# Patient Record
Sex: Female | Born: 1935 | Race: White | Hispanic: No | Smoking: Never smoker
Health system: Southern US, Community
[De-identification: ages and names within clinical notes are randomized; demographics above are authoritative.]

## PROBLEM LIST (undated history)

## (undated) DIAGNOSIS — I10 Essential (primary) hypertension: Secondary | ICD-10-CM

## (undated) DIAGNOSIS — I7121 Aneurysm of the ascending aorta, without rupture: Secondary | ICD-10-CM

## (undated) DIAGNOSIS — G500 Trigeminal neuralgia: Secondary | ICD-10-CM

## (undated) DIAGNOSIS — D649 Anemia, unspecified: Secondary | ICD-10-CM

## (undated) DIAGNOSIS — Z9289 Personal history of other medical treatment: Secondary | ICD-10-CM

## (undated) DIAGNOSIS — D751 Secondary polycythemia: Secondary | ICD-10-CM

## (undated) DIAGNOSIS — Z87442 Personal history of urinary calculi: Secondary | ICD-10-CM

## (undated) DIAGNOSIS — I5032 Chronic diastolic (congestive) heart failure: Secondary | ICD-10-CM

## (undated) DIAGNOSIS — I480 Paroxysmal atrial fibrillation: Secondary | ICD-10-CM

## (undated) DIAGNOSIS — D696 Thrombocytopenia, unspecified: Secondary | ICD-10-CM

## (undated) DIAGNOSIS — I712 Thoracic aortic aneurysm, without rupture: Secondary | ICD-10-CM

## (undated) DIAGNOSIS — I499 Cardiac arrhythmia, unspecified: Secondary | ICD-10-CM

## (undated) DIAGNOSIS — I2510 Atherosclerotic heart disease of native coronary artery without angina pectoris: Secondary | ICD-10-CM

## (undated) DIAGNOSIS — A419 Sepsis, unspecified organism: Secondary | ICD-10-CM

## (undated) HISTORY — DX: Trigeminal neuralgia: G50.0

## (undated) HISTORY — DX: Thoracic aortic aneurysm, without rupture: I71.2

## (undated) HISTORY — DX: Thrombocytopenia, unspecified: D69.6

## (undated) HISTORY — PX: EYE SURGERY: SHX253

## (undated) HISTORY — PX: OTHER SURGICAL HISTORY: SHX169

## (undated) HISTORY — DX: Hypomagnesemia: E83.42

## (undated) HISTORY — DX: Paroxysmal atrial fibrillation: I48.0

## (undated) HISTORY — PX: BREAST CYST EXCISION: SHX579

## (undated) HISTORY — DX: Atherosclerotic heart disease of native coronary artery without angina pectoris: I25.10

## (undated) HISTORY — DX: Sepsis, unspecified organism: A41.9

## (undated) HISTORY — DX: Chronic diastolic (congestive) heart failure: I50.32

## (undated) HISTORY — DX: Essential (primary) hypertension: I10

## (undated) HISTORY — DX: Aneurysm of the ascending aorta, without rupture: I71.21

## (undated) HISTORY — DX: Secondary polycythemia: D75.1

---

## 2001-06-18 ENCOUNTER — Ambulatory Visit (HOSPITAL_COMMUNITY): Admission: RE | Admit: 2001-06-18 | Discharge: 2001-06-18 | Payer: Self-pay | Admitting: Family Medicine

## 2001-06-18 ENCOUNTER — Encounter: Payer: Self-pay | Admitting: Family Medicine

## 2002-06-12 ENCOUNTER — Ambulatory Visit (HOSPITAL_COMMUNITY): Admission: RE | Admit: 2002-06-12 | Discharge: 2002-06-12 | Payer: Self-pay | Admitting: Neurology

## 2005-05-07 ENCOUNTER — Ambulatory Visit (HOSPITAL_COMMUNITY): Admission: RE | Admit: 2005-05-07 | Discharge: 2005-05-07 | Payer: Self-pay | Admitting: Internal Medicine

## 2005-05-07 ENCOUNTER — Ambulatory Visit: Payer: Self-pay | Admitting: Internal Medicine

## 2010-10-17 ENCOUNTER — Encounter (HOSPITAL_COMMUNITY)
Admission: RE | Admit: 2010-10-17 | Discharge: 2010-10-17 | Disposition: A | Discharge status: Home / Self Care | Payer: Medicare Other | Source: Ambulatory Visit | Attending: Ophthalmology | Admitting: Ophthalmology

## 2010-10-17 LAB — BASIC METABOLIC PANEL
BUN: 19 mg/dL (ref 6–23)
CO2: 33 mEq/L — ABNORMAL HIGH (ref 19–32)
Calcium: 10.6 mg/dL — ABNORMAL HIGH (ref 8.4–10.5)
Chloride: 101 mEq/L (ref 96–112)
Creatinine, Ser: 0.79 mg/dL (ref 0.50–1.10)
GFR calc Af Amer: >60 mL/min (ref >60)
GFR calc non Af Amer: >60 mL/min (ref >60)
Glucose, Bld: 84 mg/dL (ref 70–99)
Potassium: 4.3 mEq/L (ref 3.5–5.1)
Sodium: 141 mEq/L (ref 135–145)

## 2010-10-17 LAB — HEMOGLOBIN AND HEMATOCRIT, BLOOD
HCT: 43.4 % (ref 36.0–46.0)
Hemoglobin: 14.3 g/dL (ref 12.0–15.0)

## 2010-10-23 ENCOUNTER — Ambulatory Visit (HOSPITAL_COMMUNITY)
Admission: RE | Admit: 2010-10-23 | Discharge: 2010-10-23 | Disposition: A | Discharge status: Home / Self Care | Payer: Medicare Other | Source: Ambulatory Visit | Attending: Ophthalmology | Admitting: Ophthalmology

## 2010-10-23 DIAGNOSIS — I10 Essential (primary) hypertension: Secondary | ICD-10-CM | POA: Insufficient documentation

## 2010-10-23 DIAGNOSIS — Z79899 Other long term (current) drug therapy: Secondary | ICD-10-CM | POA: Insufficient documentation

## 2010-10-23 DIAGNOSIS — Z01810 Encounter for preprocedural cardiovascular examination: Secondary | ICD-10-CM | POA: Insufficient documentation

## 2010-10-23 DIAGNOSIS — Z01812 Encounter for preprocedural laboratory examination: Secondary | ICD-10-CM | POA: Insufficient documentation

## 2010-10-23 DIAGNOSIS — H2510 Age-related nuclear cataract, unspecified eye: Secondary | ICD-10-CM | POA: Insufficient documentation

## 2012-04-23 HISTORY — PX: ESOPHAGOGASTRODUODENOSCOPY: SHX1529

## 2013-02-20 ENCOUNTER — Telehealth (INDEPENDENT_AMBULATORY_CARE_PROVIDER_SITE_OTHER): Payer: Self-pay | Admitting: *Deleted

## 2013-02-20 ENCOUNTER — Ambulatory Visit (INDEPENDENT_AMBULATORY_CARE_PROVIDER_SITE_OTHER): Payer: Medicare Other | Admitting: Internal Medicine

## 2013-02-20 ENCOUNTER — Other Ambulatory Visit (INDEPENDENT_AMBULATORY_CARE_PROVIDER_SITE_OTHER): Payer: Self-pay | Admitting: *Deleted

## 2013-02-20 ENCOUNTER — Encounter (INDEPENDENT_AMBULATORY_CARE_PROVIDER_SITE_OTHER): Payer: Self-pay | Admitting: Internal Medicine

## 2013-02-20 VITALS — BP 138/74 | HR 80 | Temp 98.5°F | Ht 60.0 in | Wt 146.1 lb

## 2013-02-20 DIAGNOSIS — E7800 Pure hypercholesterolemia, unspecified: Secondary | ICD-10-CM | POA: Insufficient documentation

## 2013-02-20 DIAGNOSIS — D649 Anemia, unspecified: Secondary | ICD-10-CM | POA: Insufficient documentation

## 2013-02-20 DIAGNOSIS — Z1211 Encounter for screening for malignant neoplasm of colon: Secondary | ICD-10-CM

## 2013-02-20 DIAGNOSIS — R195 Other fecal abnormalities: Secondary | ICD-10-CM

## 2013-02-20 DIAGNOSIS — I10 Essential (primary) hypertension: Secondary | ICD-10-CM | POA: Insufficient documentation

## 2013-02-20 DIAGNOSIS — E785 Hyperlipidemia, unspecified: Secondary | ICD-10-CM | POA: Insufficient documentation

## 2013-02-20 NOTE — Progress Notes (Signed)
Subjective:     Patient ID: [name], female   DOB: 1928/07/01, 79 y.o.   MRN: [id number]  HPI 79 yr old white female referred to our office by Dr. [name] for anemia. On recent office visit to Dr. Margo Aye, patient c/o SOB for the pat month. Noticed when walking up steps. She was started on Iron and she does feel better. She denies prior hx of anemia. She underwent a colonoscopy in 2007 and was normal. (screening). No hx of polyps.  Appetite is good. No weight loss. She has frequent acid reflux and she will take a prn acid reliever. There is no abdominal pain. She has a BM daily. Stools are dark from the iron. Before the iron, her stools were brown. She has not seen any blood in her stools.   01/27/2013 H and H 8.9 and 29.1, MCV 69.3, Platelet ct 294.  NA 140, K 4.1, Chloride 102, Glucose 108, BUN 16, Creatinine 0.8, Total bili 0.4, ALP 52, AST 18, ALT 19, Total protein 6.8, Albumin 4.3, Calcium 9.5   Review of Systems See hpi Current Outpatient Prescriptions  Medication Sig Dispense Refill  . alendronate (FOSAMAX) 70 MG tablet Take 70 mg by mouth every 7 (seven) days. Take with a full glass of water on an empty stomach.      Marland Kitchen aspirin 81 MG tablet Take 81 mg by mouth daily.      . Calcium Carbonate-Vitamin D (CALTRATE 600+D PO) Take by mouth.      . cholecalciferol (VITAMIN D) 1000 UNITS tablet Take 400 Units by mouth daily.      Marland Kitchen co-enzyme Q-10 30 MG capsule Take 30 mg by mouth 3 (three) times daily.      . ferrous sulfate 325 (65 FE) MG tablet Take 325 mg by mouth daily with breakfast.      . fish oil-omega-3 fatty acids 1000 MG capsule Take 2 g by mouth daily.      Marland Kitchen gabapentin (NEURONTIN) 300 MG capsule Take 300 mg by mouth daily.      . Plant Sterol Stanol-Pantethine 450-75 MG TABS Take by mouth.      . pravastatin (PRAVACHOL) 40 MG tablet Take 40 mg by mouth daily.      Marland Kitchen triamterene-hydrochlorothiazide (MAXZIDE-25) 37.5-25 MG per tablet Take 1 tablet by mouth daily.        No current facility-administered medications for this visit.   Past Medical History  Diagnosis Date  . Hypertension   . Trigeminal neuralgia    History reviewed. No pertinent past surgical history. Allergies  Allergen Reactions  . Sulfa Antibiotics     Feel crazy       Objective:   Physical Exam  Filed Vitals:   02/20/13 0932  BP: 138/74  Pulse: 80  Temp: 98.5 F (36.9 C)  Height: 5' (1.524 m)  Weight: 146 lb 1.6 oz (66.271 kg)   Alert and oriented. Skin warm and dry. Oral mucosa is moist.   . Sclera anicteric, conjunctivae is pink. Thyroid not enlarged. No cervical lymphadenopathy. Lungs clear. Heart regular rate and rhythm.  Abdomen is soft. Bowel sounds are positive. No hepatomegaly. No abdominal masses felt. No tenderness.  No edema to lower extremities.  Stool dark and guaiac positive      Assessment:    Anemia. PUD, colonic neoplasm needs to be ruled out.    Plan:    EGD/Colonoscopy

## 2013-02-20 NOTE — Telephone Encounter (Signed)
Patient needs movi prep 

## 2013-02-20 NOTE — Patient Instructions (Signed)
EGD/Colonoscopy

## 2013-02-25 MED ORDER — PEG-KCL-NACL-NASULF-NA ASC-C 100 G PO SOLR: 1.0000 | Freq: Once | ORAL | Status: DC

## 2013-03-10 ENCOUNTER — Encounter (HOSPITAL_COMMUNITY): Payer: Self-pay | Admitting: *Deleted

## 2013-03-10 ENCOUNTER — Encounter (HOSPITAL_COMMUNITY)
Admission: RE | Disposition: A | Discharge status: Home / Self Care | Payer: Self-pay | Source: Ambulatory Visit | Attending: Internal Medicine

## 2013-03-10 ENCOUNTER — Ambulatory Visit (HOSPITAL_COMMUNITY)
Admission: RE | Admit: 2013-03-10 | Discharge: 2013-03-10 | Disposition: A | Discharge status: Home / Self Care | Payer: Medicare Other | Source: Ambulatory Visit | Attending: Internal Medicine | Admitting: Internal Medicine

## 2013-03-10 DIAGNOSIS — K259 Gastric ulcer, unspecified as acute or chronic, without hemorrhage or perforation: Secondary | ICD-10-CM

## 2013-03-10 DIAGNOSIS — K449 Diaphragmatic hernia without obstruction or gangrene: Secondary | ICD-10-CM

## 2013-03-10 DIAGNOSIS — R195 Other fecal abnormalities: Secondary | ICD-10-CM

## 2013-03-10 DIAGNOSIS — K2960 Other gastritis without bleeding: Secondary | ICD-10-CM | POA: Insufficient documentation

## 2013-03-10 DIAGNOSIS — K5730 Diverticulosis of large intestine without perforation or abscess without bleeding: Secondary | ICD-10-CM | POA: Insufficient documentation

## 2013-03-10 DIAGNOSIS — D509 Iron deficiency anemia, unspecified: Secondary | ICD-10-CM | POA: Insufficient documentation

## 2013-03-10 DIAGNOSIS — K2100 Gastro-esophageal reflux disease with esophagitis, without bleeding: Secondary | ICD-10-CM | POA: Insufficient documentation

## 2013-03-10 DIAGNOSIS — K219 Gastro-esophageal reflux disease without esophagitis: Secondary | ICD-10-CM

## 2013-03-10 DIAGNOSIS — D649 Anemia, unspecified: Secondary | ICD-10-CM

## 2013-03-10 DIAGNOSIS — K222 Esophageal obstruction: Secondary | ICD-10-CM

## 2013-03-10 DIAGNOSIS — I10 Essential (primary) hypertension: Secondary | ICD-10-CM | POA: Insufficient documentation

## 2013-03-10 HISTORY — PX: COLONOSCOPY WITH ESOPHAGOGASTRODUODENOSCOPY (EGD): SHX5779

## 2013-03-10 LAB — HEMOGLOBIN AND HEMATOCRIT, BLOOD
HCT: 27.7 % — ABNORMAL LOW (ref 36.0–46.0)
Hemoglobin: 8.2 g/dL — ABNORMAL LOW (ref 12.0–15.0)

## 2013-03-10 SURGERY — COLONOSCOPY WITH ESOPHAGOGASTRODUODENOSCOPY (EGD)
Anesthesia: Moderate Sedation

## 2013-03-10 MED ORDER — PANTOPRAZOLE SODIUM 40 MG PO TBEC: 40.0000 mg | DELAYED_RELEASE_TABLET | Freq: Every day | ORAL | Status: DC

## 2013-03-10 MED ORDER — MEPERIDINE HCL 50 MG/ML IJ SOLN
INTRAMUSCULAR | Status: AC
  Filled 2013-03-10: qty 1

## 2013-03-10 MED ORDER — MIDAZOLAM HCL 5 MG/5ML IJ SOLN
INTRAMUSCULAR | Status: DC | PRN
  Administered 2013-03-10: 2 mg via INTRAVENOUS
  Administered 2013-03-10 (×2): 1 mg via INTRAVENOUS

## 2013-03-10 MED ORDER — STERILE WATER FOR IRRIGATION IR SOLN
Status: DC | PRN
  Administered 2013-03-10: 07:00:00

## 2013-03-10 MED ORDER — SODIUM CHLORIDE 0.9 % IV SOLN
INTRAVENOUS | Status: DC
  Administered 2013-03-10: 1000 mL via INTRAVENOUS

## 2013-03-10 MED ORDER — MEPERIDINE HCL 50 MG/ML IJ SOLN
INTRAMUSCULAR | Status: DC | PRN
  Administered 2013-03-10 (×2): 25 mg via INTRAVENOUS

## 2013-03-10 MED ORDER — LIDOCAINE HCL 2 % EX GEL
CUTANEOUS | Status: AC
  Filled 2013-03-10: qty 30

## 2013-03-10 MED ORDER — MIDAZOLAM HCL 5 MG/5ML IJ SOLN
INTRAMUSCULAR | Status: AC
  Filled 2013-03-10: qty 10

## 2013-03-10 NOTE — H&P (Addendum)
[name] is an 79 y.o. female.   Chief Complaint: Patient is here for EGD, ED colonoscopy. HPI: This 79 year old Caucasian female who presented to Dr. [name] about 6 weeks ago with exertional dyspnea and found to have hemoglobin of 8.9 and low MCV and found to have iron deficiency anemia. She was begun on iron and feels better although she has not had followup H&H. She has frequent but not daily heartburn relieved with OTC medication. She denies abdominal pain melena or rectal bleeding but has noted intermittent dysphagia to solids.She is on low-dose aspirin and Fosamax but does not take OTC NSAIDs. Since last colonoscopy was in 2007 and was normal. Family history is negative for CRC.Marland Kitchen  Past Medical History  Diagnosis Date  . Hypertension   . Trigeminal neuralgia     Past Surgical History  Procedure Laterality Date  . Breast cyst excision  60 yrs ago    History reviewed. No pertinent family history. Social History:  reports that she has never smoked. She does not have any smokeless tobacco history on file. She reports that she does not drink alcohol or use illicit drugs.  Allergies:  Allergies  Allergen Reactions  . Sulfa Antibiotics     Feel crazy    Medications Prior to Admission  Medication Sig Dispense Refill  . alendronate (FOSAMAX) 70 MG tablet Take 70 mg by mouth every 7 (seven) days. Take with a full glass of water on an empty stomach.      Marland Kitchen aspirin 81 MG tablet Take 81 mg by mouth daily.      . Calcium Carbonate-Vitamin D (CALTRATE 600+D PO) Take by mouth.      . cholecalciferol (VITAMIN D) 1000 UNITS tablet Take 400 Units by mouth daily.      Marland Kitchen co-enzyme Q-10 30 MG capsule Take 30 mg by mouth 3 (three) times daily.      . fish oil-omega-3 fatty acids 1000 MG capsule Take 2 g by mouth daily.      Marland Kitchen gabapentin (NEURONTIN) 300 MG capsule Take 300 mg by mouth daily.      . peg 3350 powder (MOVIPREP) 100 G SOLR Take 1 kit (200 g total) by mouth once.  1 kit  0  .  Plant Sterol Stanol-Pantethine 450-75 MG TABS Take by mouth.      . pravastatin (PRAVACHOL) 40 MG tablet Take 40 mg by mouth daily.      Marland Kitchen triamterene-hydrochlorothiazide (MAXZIDE-25) 37.5-25 MG per tablet Take 1 tablet by mouth daily.      . ferrous sulfate 325 (65 FE) MG tablet Take 325 mg by mouth daily with breakfast.        No results found for this or any previous visit (from the past 48 hour(s)). No results found.  ROS  Blood pressure 153/85, temperature 97.9 F (36.6 C), temperature source Oral, resp. rate 18, height 5' (1.524 m), weight 145 lb (65.772 kg), SpO2 97.00%. Physical Exam  Constitutional: She appears well-developed and well-nourished.  HENT:  Mouth/Throat: Oropharynx is clear and moist.  Eyes: Conjunctivae are normal. No scleral icterus.  Neck: No thyromegaly present.  Cardiovascular: Normal rate, regular rhythm and normal heart sounds.   No murmur heard. Respiratory: Effort normal and breath sounds normal.  GI: Soft. She exhibits no distension and no mass. There is no tenderness.  Musculoskeletal: She exhibits no edema.  Lymphadenopathy:    She has no cervical adenopathy.  Neurological: She is alert.  Skin: Skin is warm and dry.  Assessment/Plan Iron deficiency anemia. Intermittent dysphagia in a patient with chronic GERD. EGD, ED colonoscopy.  REHMAN,NAJEEB U 03/10/2013, 7:36 AM

## 2013-03-10 NOTE — Op Note (Signed)
EGD PROCEDURE REPORT  PATIENT:  [name]  MR#:  [id number] Birthdate:  06-19-1928, 78 y.o., female Endoscopist:  Dr. Malissa Hippo, MD Referred By:  Dr. [name], MD Procedure Date: 03/10/2013  Procedure:   EGD, ED & Colonoscopy  Indications:  Patient is an 79 year old Caucasian female who was found to have iron deficiency anemia when she presented with exertional dyspnea. She has frequent but not daily heartburn and also complains of intermittent solid food dysphagia. She is on low-dose aspirin also takes alendronate every week. She does not take OTC NSAIDs. She is undergoing diagnostic evaluation.         Informed Consent:  The risks, benefits, alternatives & imponderables which include, but are not limited to, bleeding, infection, perforation, drug reaction and potential missed lesion have been reviewed.  The potential for biopsy, lesion removal, esophageal dilation, etc. have also been discussed.  Questions have been answered.  All parties agreeable.  Please see history & physical in medical record for more information.  Medications:  Demerol 50 mg  Versed 4 mg V Cetacaine spray topically for oropharyngeal anesthesia  EGD  Description of procedure:  The endoscope was introduced through the mouth and advanced to the second portion of the duodenum without difficulty or limitations. The mucosal surfaces were surveyed very carefully during advancement of the scope and upon withdrawal.  Findings:  Esophagus:  Mucosa of the proximal and middle third was normal. Scattered erosions noted in the distal 3 cm aong with scarring and stricture at GE junction. GEJ:  30 cm Hiatus:  35 cm Stomach:  Stomach was empty and distended very well with insufflation. Long linear erosion noted at level of hiatus. Single small ulcer noted towards anterior wall at gastric body along with few antral erosions. Pyloric channel was patent. Angularis was unremarkable. Hernia was easily seen on retroflexed  view. Duodenum:  Normal bulbar and post bulbar mucosa.   Therapeutic/Diagnostic Maneuvers Performed:   Stricture at GE junction was dilated with balloon dilator. Balloon dilator was passed with the scope. The guidewire was pushed into the gastric lumen. The balloon dilator was positioned across GE junction by withdrawing scope in the body of the esophagus. Stricture was dilated initially to 15 mm and subsequently to 16.5 and finally to 18 mm resulting in mucosal disruption at GE junction. Balloon was deflated and withdrawn. Patient prepared for procedure #2.  COLONOSCOPY Description of procedure:  After a digital rectal exam was performed, that colonoscope was advanced from the anus through the rectum and colon to the area of the cecum, ileocecal valve and appendiceal orifice. The cecum was deeply intubated. These structures were well-seen and photographed for the record. From the level of the cecum and ileocecal valve, the scope was slowly and cautiously withdrawn. The mucosal surfaces were carefully surveyed utilizing scope tip to flexion to facilitate fold flattening as needed. The scope was pulled down into the rectum where a thorough exam including retroflexion was performed.  Findings:   Prep excellent. Few scattered diverticula at sigmoid colon. Normal rectal mucosa and anal rectal junction.  Therapeutic/Diagnostic Maneuvers Performed:  None  Complications:  None  Cecal Withdrawal Time:  9 minutes  Impression:  EGD findings; Erosive reflux esophagitis with stricture at GE junction which was dilated with a balloon dilator to 18 mm. Moderate size sliding hiatal hernia. Small ulcer at gastric body along with antral erosions.  Colonoscopy findings; Normal colonoscopy except few diverticula at sigmoid colon.  Suspect patient has been losing blood from  upper GI tract.  Recommendations:  Will check H&H and H. pylori serology today. Patient advised to hold alendronate for 4 weeks  but will continue low-dose aspirin as before. Pantoprazole 40 mg by mouth every morning. I will be contacting patient results of blood work and further recommendations.  REHMAN,NAJEEB U  03/10/2013 8:39 AM  CC: Dr. Catalina Pizza, MD & Dr. Bonnetta Barry ref. provider found

## 2013-03-11 LAB — H. PYLORI ANTIBODY, IGG: H Pylori IgG: 1.23 {ISR} — ABNORMAL HIGH

## 2013-03-12 ENCOUNTER — Other Ambulatory Visit (INDEPENDENT_AMBULATORY_CARE_PROVIDER_SITE_OTHER): Payer: Self-pay | Admitting: Internal Medicine

## 2013-03-12 MED ORDER — BIS SUBCIT-METRONID-TETRACYC 140-125-125 MG PO CAPS: 3.0000 | ORAL_CAPSULE | Freq: Three times a day (TID) | ORAL | Status: DC

## 2013-03-16 ENCOUNTER — Telehealth (INDEPENDENT_AMBULATORY_CARE_PROVIDER_SITE_OTHER): Payer: Self-pay | Admitting: *Deleted

## 2013-03-16 DIAGNOSIS — D649 Anemia, unspecified: Secondary | ICD-10-CM

## 2013-03-16 LAB — HEMOGLOBIN AND HEMATOCRIT, BLOOD
HCT: 32.6 % — ABNORMAL LOW (ref 36.0–46.0)
Hemoglobin: 9.6 g/dL — ABNORMAL LOW (ref 12.0–15.0)

## 2013-03-16 NOTE — Telephone Encounter (Signed)
Per Dr.Rehman the patient will need to have labs drawn this week. 

## 2013-03-17 ENCOUNTER — Other Ambulatory Visit (INDEPENDENT_AMBULATORY_CARE_PROVIDER_SITE_OTHER): Payer: Self-pay | Admitting: *Deleted

## 2013-03-17 ENCOUNTER — Encounter (HOSPITAL_COMMUNITY): Payer: Self-pay | Admitting: Internal Medicine

## 2013-03-17 ENCOUNTER — Encounter (INDEPENDENT_AMBULATORY_CARE_PROVIDER_SITE_OTHER): Payer: Self-pay | Admitting: *Deleted

## 2013-03-17 ENCOUNTER — Telehealth (INDEPENDENT_AMBULATORY_CARE_PROVIDER_SITE_OTHER): Payer: Self-pay | Admitting: *Deleted

## 2013-03-17 DIAGNOSIS — D649 Anemia, unspecified: Secondary | ICD-10-CM

## 2013-03-17 NOTE — Telephone Encounter (Signed)
Per Dr.Rehman the patient will need to have labs drawn. 

## 2013-03-25 ENCOUNTER — Other Ambulatory Visit (INDEPENDENT_AMBULATORY_CARE_PROVIDER_SITE_OTHER): Payer: Self-pay | Admitting: Internal Medicine

## 2013-03-25 ENCOUNTER — Telehealth (INDEPENDENT_AMBULATORY_CARE_PROVIDER_SITE_OTHER): Payer: Self-pay | Admitting: *Deleted

## 2013-03-25 DIAGNOSIS — K6289 Other specified diseases of anus and rectum: Secondary | ICD-10-CM

## 2013-03-25 MED ORDER — HYDROCORTISONE ACE-PRAMOXINE 1-1 % RE FOAM: 1.0000 | Freq: Two times a day (BID) | RECTAL | Status: DC

## 2013-03-25 NOTE — Telephone Encounter (Signed)
[name] is on her 7th day of Pylera and having diarrhea so bad she is bleeding. Lidocaine is not working. Would like to see if [name] would please call her something in and return her call at [phone].

## 2013-03-25 NOTE — Telephone Encounter (Signed)
I am going to call her Proctofoam in and she can taking Imoidum BID for her diarrhea.

## 2013-04-14 LAB — HEMOGLOBIN AND HEMATOCRIT, BLOOD: Hemoglobin: 13.5 g/dL (ref 12.0–15.0)

## 2013-04-20 ENCOUNTER — Telehealth (INDEPENDENT_AMBULATORY_CARE_PROVIDER_SITE_OTHER): Payer: Self-pay | Admitting: *Deleted

## 2013-04-20 DIAGNOSIS — D649 Anemia, unspecified: Secondary | ICD-10-CM

## 2013-04-20 NOTE — Telephone Encounter (Signed)
Per Dr.Rehman the patient will need to have labs drawn in 3 months 

## 2013-06-17 ENCOUNTER — Other Ambulatory Visit (INDEPENDENT_AMBULATORY_CARE_PROVIDER_SITE_OTHER): Payer: Self-pay | Admitting: *Deleted

## 2013-06-17 ENCOUNTER — Encounter (INDEPENDENT_AMBULATORY_CARE_PROVIDER_SITE_OTHER): Payer: Self-pay | Admitting: *Deleted

## 2013-06-17 DIAGNOSIS — D649 Anemia, unspecified: Secondary | ICD-10-CM

## 2013-07-20 LAB — HEMOGLOBIN AND HEMATOCRIT, BLOOD
HCT: 46.7 % — ABNORMAL HIGH (ref 36.0–46.0)
HEMOGLOBIN: 15.6 g/dL — AB (ref 12.0–15.0)

## 2013-07-27 ENCOUNTER — Telehealth (INDEPENDENT_AMBULATORY_CARE_PROVIDER_SITE_OTHER): Payer: Self-pay | Admitting: *Deleted

## 2013-07-27 DIAGNOSIS — D649 Anemia, unspecified: Secondary | ICD-10-CM

## 2013-07-27 NOTE — Telephone Encounter (Signed)
Per Dr.Rehman the patient will need to have labs drawn in 3 months 

## 2013-10-07 ENCOUNTER — Encounter (INDEPENDENT_AMBULATORY_CARE_PROVIDER_SITE_OTHER): Payer: Self-pay | Admitting: *Deleted

## 2013-10-07 ENCOUNTER — Other Ambulatory Visit (INDEPENDENT_AMBULATORY_CARE_PROVIDER_SITE_OTHER): Payer: Self-pay | Admitting: *Deleted

## 2013-10-07 DIAGNOSIS — D649 Anemia, unspecified: Secondary | ICD-10-CM

## 2013-10-09 ENCOUNTER — Encounter (INDEPENDENT_AMBULATORY_CARE_PROVIDER_SITE_OTHER): Payer: Self-pay

## 2013-10-26 LAB — CBC
HCT: 46.2 % — ABNORMAL HIGH (ref 36.0–46.0)
Hemoglobin: 16.4 g/dL — ABNORMAL HIGH (ref 12.0–15.0)
MCH: 31.5 pg (ref 26.0–34.0)
MCHC: 35.5 g/dL (ref 30.0–36.0)
MCV: 88.7 fL (ref 78.0–100.0)
PLATELETS: 194 10*3/uL (ref 150–400)
RBC: 5.21 MIL/uL — AB (ref 3.87–5.11)
RDW: 12.8 % (ref 11.5–15.5)
WBC: 5.2 10*3/uL (ref 4.0–10.5)

## 2013-11-02 ENCOUNTER — Telehealth (INDEPENDENT_AMBULATORY_CARE_PROVIDER_SITE_OTHER): Payer: Self-pay | Admitting: *Deleted

## 2013-11-02 DIAGNOSIS — D509 Iron deficiency anemia, unspecified: Secondary | ICD-10-CM

## 2013-11-02 NOTE — Telephone Encounter (Signed)
Per Dr.Rehman the patient will need to have labs drawn in 8 weeks. 

## 2013-12-09 ENCOUNTER — Other Ambulatory Visit (INDEPENDENT_AMBULATORY_CARE_PROVIDER_SITE_OTHER): Payer: Self-pay | Admitting: *Deleted

## 2013-12-09 ENCOUNTER — Encounter (INDEPENDENT_AMBULATORY_CARE_PROVIDER_SITE_OTHER): Payer: Self-pay | Admitting: *Deleted

## 2013-12-09 DIAGNOSIS — D509 Iron deficiency anemia, unspecified: Secondary | ICD-10-CM

## 2013-12-30 LAB — CBC WITH DIFFERENTIAL/PLATELET
BASOS ABS: 0.1 10*3/uL (ref 0.0–0.1)
Basophils Relative: 2 % — ABNORMAL HIGH (ref 0–1)
Eosinophils Absolute: 0.1 10*3/uL (ref 0.0–0.7)
Eosinophils Relative: 2 % (ref 0–5)
HCT: 48 % — ABNORMAL HIGH (ref 36.0–46.0)
HEMOGLOBIN: 16.4 g/dL — AB (ref 12.0–15.0)
Lymphocytes Relative: 36 % (ref 12–46)
Lymphs Abs: 1.6 10*3/uL (ref 0.7–4.0)
MCH: 31 pg (ref 26.0–34.0)
MCHC: 34.2 g/dL (ref 30.0–36.0)
MCV: 90.7 fL (ref 78.0–100.0)
MONO ABS: 0.6 10*3/uL (ref 0.1–1.0)
Monocytes Relative: 13 % — ABNORMAL HIGH (ref 3–12)
NEUTROS ABS: 2.1 10*3/uL (ref 1.7–7.7)
NEUTROS PCT: 47 % (ref 43–77)
Platelets: 202 10*3/uL (ref 150–400)
RBC: 5.29 MIL/uL — ABNORMAL HIGH (ref 3.87–5.11)
RDW: 12.6 % (ref 11.5–15.5)
WBC: 4.4 10*3/uL (ref 4.0–10.5)

## 2014-02-01 ENCOUNTER — Encounter (HOSPITAL_COMMUNITY): Payer: Self-pay

## 2014-02-01 ENCOUNTER — Encounter (HOSPITAL_COMMUNITY): Payer: Medicare Other | Attending: Hematology and Oncology

## 2014-02-01 VITALS — BP 175/84 | HR 88 | Temp 98.0°F | Resp 18 | Wt 144.0 lb

## 2014-02-01 DIAGNOSIS — D751 Secondary polycythemia: Secondary | ICD-10-CM | POA: Diagnosis not present

## 2014-02-01 DIAGNOSIS — D500 Iron deficiency anemia secondary to blood loss (chronic): Secondary | ICD-10-CM | POA: Insufficient documentation

## 2014-02-01 DIAGNOSIS — D631 Anemia in chronic kidney disease: Secondary | ICD-10-CM

## 2014-02-01 DIAGNOSIS — K219 Gastro-esophageal reflux disease without esophagitis: Secondary | ICD-10-CM | POA: Insufficient documentation

## 2014-02-01 DIAGNOSIS — G500 Trigeminal neuralgia: Secondary | ICD-10-CM | POA: Insufficient documentation

## 2014-02-01 DIAGNOSIS — Z79899 Other long term (current) drug therapy: Secondary | ICD-10-CM | POA: Insufficient documentation

## 2014-02-01 DIAGNOSIS — D649 Anemia, unspecified: Secondary | ICD-10-CM | POA: Insufficient documentation

## 2014-02-01 DIAGNOSIS — N189 Chronic kidney disease, unspecified: Secondary | ICD-10-CM

## 2014-02-01 LAB — COMPREHENSIVE METABOLIC PANEL
ALK PHOS: 62 U/L (ref 39–117)
ALT: 17 U/L (ref 0–35)
AST: 22 U/L (ref 0–37)
Albumin: 4 g/dL (ref 3.5–5.2)
Anion gap: 12 (ref 5–15)
BUN: 19 mg/dL (ref 6–23)
CHLORIDE: 99 meq/L (ref 96–112)
CO2: 30 meq/L (ref 19–32)
Calcium: 9.8 mg/dL (ref 8.4–10.5)
Creatinine, Ser: 0.92 mg/dL (ref 0.50–1.10)
GFR calc Af Amer: 65 mL/min — ABNORMAL LOW (ref >90)
GFR calc non Af Amer: 56 mL/min — ABNORMAL LOW (ref >90)
Glucose, Bld: 81 mg/dL (ref 70–99)
POTASSIUM: 4.4 meq/L (ref 3.7–5.3)
SODIUM: 141 meq/L (ref 137–147)
Total Bilirubin: 0.5 mg/dL (ref 0.3–1.2)
Total Protein: 7.7 g/dL (ref 6.0–8.3)

## 2014-02-01 LAB — CBC WITH DIFFERENTIAL/PLATELET
Basophils Absolute: 0 10*3/uL (ref 0.0–0.1)
Basophils Relative: 1 % (ref 0–1)
Eosinophils Absolute: 0.1 10*3/uL (ref 0.0–0.7)
Eosinophils Relative: 2 % (ref 0–5)
HCT: 46.5 % — ABNORMAL HIGH (ref 36.0–46.0)
Hemoglobin: 15.9 g/dL — ABNORMAL HIGH (ref 12.0–15.0)
LYMPHS ABS: 1.4 10*3/uL (ref 0.7–4.0)
LYMPHS PCT: 21 % (ref 12–46)
MCH: 31.1 pg (ref 26.0–34.0)
MCHC: 34.2 g/dL (ref 30.0–36.0)
MCV: 90.8 fL (ref 78.0–100.0)
MONOS PCT: 10 % (ref 3–12)
Monocytes Absolute: 0.7 10*3/uL (ref 0.1–1.0)
NEUTROS ABS: 4.5 10*3/uL (ref 1.7–7.7)
NEUTROS PCT: 66 % (ref 43–77)
PLATELETS: 186 10*3/uL (ref 150–400)
RBC: 5.12 MIL/uL — AB (ref 3.87–5.11)
RDW: 12.5 % (ref 11.5–15.5)
WBC: 6.8 10*3/uL (ref 4.0–10.5)

## 2014-02-01 MED ORDER — INFLUENZA VAC SPLIT QUAD 0.5 ML IM SUSY: 0.5000 mL | PREFILLED_SYRINGE | Freq: Once | INTRAMUSCULAR | Status: DC

## 2014-02-01 NOTE — Patient Instructions (Signed)
[facility] Discharge Instructions  RECOMMENDATIONS MADE BY THE CONSULTANT AND ANY TEST RESULTS WILL BE SENT TO YOUR REFERRING PHYSICIAN.  EXAM FINDINGS BY THE PHYSICIAN TODAY AND SIGNS OR SYMPTOMS TO REPORT TO CLINIC OR PRIMARY PHYSICIAN: You saw dr Zigmund Danielformanek today  Follow up in 2 weeks with doctors visit and lab appt  Thank you for choosing [facility] to provide your oncology and hematology care.  To afford each patient quality time with our providers, please arrive at least 15 minutes before your scheduled appointment time.  With your help, our goal is to use those 15 minutes to complete the necessary work-up to ensure our physicians have the information they need to help with your evaluation and healthcare recommendations.    Effective January 1st, 2014, we ask that you re-schedule your appointment with our physicians should you arrive 10 or more minutes late for your appointment.  We strive to give you quality time with our providers, and arriving late affects you and other patients whose appointments are after yours.    Again, thank you for choosing [facility].  Our hope is that these requests will decrease the amount of time that you wait before being seen by our physicians.       _____________________________________________________________  Should you have questions after your visit to [facility], please contact our office at [phone] between the hours of 8:30 a.m. and 5:00 p.m.  Voicemails left after 4:30 p.m. will not be returned until the following business day.  For prescription refill requests, have your pharmacy contact our office with your prescription refill request.

## 2014-02-01 NOTE — Progress Notes (Signed)
1420:  [name] presented for labwork. Labs per MD order drawn via Peripheral Line 23 gauge needle inserted in left antecubital.  Good blood return present. Procedure without incident.  Needle removed intact. Patient tolerated procedure well.

## 2014-02-01 NOTE — Progress Notes (Signed)
[facility], M.D.  NEW PATIENT EVALUATION   Name: FREDRICA CAPANO Date: 02/01/2014 MRN: [id number] DOB: 1928/04/30  PCP: Delphina Cahill, MD   REFERRING PHYSICIAN: Delphina Cahill, MD                                              Rogene Houston., MD  REASON FOR REFERRAL: History of anemia in November 2014, now with polycythemia in September 2015.    HISTORY OF PRESENT ILLNESS:[name] is a 80 y.o. female who is referred by her gastroenterologist because of elevated hemoglobin in September 2015 after a diagnosis of chronic blood loss anemia was made in November of 2014 associated with gastric ulcer, GERD, and the use of Fosamax. Esophageal dilatation was also performed at that time due to chronic reflux disease with stricture.  She feels well with good appetite. She denies any nausea, vomiting, melena, or hematochezia. She also denies any epistaxis, headaches, vaginal bleeding, lower extremity swelling or redness, chest pain, PND, orthopnea, palpitations, easy satiety, or pruritus associated with taking a hot shower.   PAST MEDICAL HISTORY:  has a past medical history of Hypertension and Trigeminal neuralgia.     PAST SURGICAL HISTORY: Past Surgical History  Procedure Laterality Date  . Breast cyst excision  60 yrs ago  . Colonoscopy with esophagogastroduodenoscopy (egd) N/A 03/10/2013    Procedure: COLONOSCOPY WITH ESOPHAGOGASTRODUODENOSCOPY (EGD);  Surgeon: Rogene Houston, MD;  Location: [facility];  Service: Endoscopy;  Laterality: N/A;  730     CURRENT MEDICATIONS: has a current medication list which includes the following prescription(s): calcium carbonate-vitamin d, cholecalciferol, co-enzyme q-10, fish oil-omega-3 fatty acids, gabapentin, multivitamin with minerals, pantoprazole, plant sterol stanol-pantethine, pravastatin, and triamterene-hydrochlorothiazide, and the following Facility-Administered Medications: influenza  vac split quadrivalent pf.    ALLERGIES: Sulfa antibiotics   SOCIAL HISTORY:  reports that she has never smoked. She does not have any smokeless tobacco history on file. She reports that she does not drink alcohol or use illicit drugs.   FAMILY HISTORY: family history is not on file.    REVIEW OF SYSTEMS:  Other than that discussed above is noncontributory.    PHYSICAL EXAM:  weight is 144 lb (65.318 kg). Her oral temperature is 98 F (36.7 C). Her blood pressure is 175/84 and her pulse is 88. Her respiration is 18 and oxygen saturation is 99%.    GENERAL:alert, no distress and comfortable looking much your than her stated age. SKIN: skin color, texture, turgor are normal, no rashes or significant lesions EYES: normal, Conjunctiva are pink and non-injected, sclera clear OROPHARYNX:no exudate, no erythema and lips, buccal mucosa, and tongue normal  NECK: supple, thyroid normal size, non-tender, without nodularity CHEST: Normal AP diameter with no breast masses. LYMPH:  no palpable lymphadenopathy in the cervical, axillary or inguinal LUNGS: clear to auscultation and percussion with normal breathing effort HEART: regular rate & rhythm and no murmurs ABDOMEN:abdomen soft, non-tender and normal bowel sounds. Liver and spleen not enlarged. MUSCULOSKELETALl:no cyanosis of digits, no clubbing or edema  NEURO: alert & oriented x 3 with fluent speech, no focal motor/sensory deficits    LABORATORY DATA:   Results for ARIYON, GERSTENBERGER (MRN [id number]) as of 02/01/2014 14:29  Ref. Range 03/16/2013 10:59 04/14/2013 09:22 07/20/2013 10:19 10/26/2013 11:13 12/29/2013 10:53  Hemoglobin Latest  Range: 12.0-15.0 g/dL 9.6 (L) 13.5 15.6 (H) 16.4 (H) 16.4 (H)     Office Visit on 02/01/2014  Component Date Value Ref Range Status  . WBC 02/01/2014 6.8  4.0 - 10.5 K/uL Final  . RBC 02/01/2014 5.12* 3.87 - 5.11 MIL/uL Final  . Hemoglobin 02/01/2014 15.9* 12.0 - 15.0 g/dL Final  . HCT 02/01/2014 46.5*  36.0 - 46.0 % Final  . MCV 02/01/2014 90.8  78.0 - 100.0 fL Final  . MCH 02/01/2014 31.1  26.0 - 34.0 pg Final  . MCHC 02/01/2014 34.2  30.0 - 36.0 g/dL Final  . RDW 02/01/2014 12.5  11.5 - 15.5 % Final  . Platelets 02/01/2014 186  150 - 400 K/uL Final  . Neutrophils Relative % 02/01/2014 66  43 - 77 % Final  . Neutro Abs 02/01/2014 4.5  1.7 - 7.7 K/uL Final  . Lymphocytes Relative 02/01/2014 21  12 - 46 % Final  . Lymphs Abs 02/01/2014 1.4  0.7 - 4.0 K/uL Final  . Monocytes Relative 02/01/2014 10  3 - 12 % Final  . Monocytes Absolute 02/01/2014 0.7  0.1 - 1.0 K/uL Final  . Eosinophils Relative 02/01/2014 2  0 - 5 % Final  . Eosinophils Absolute 02/01/2014 0.1  0.0 - 0.7 K/uL Final  . Basophils Relative 02/01/2014 1  0 - 1 % Final  . Basophils Absolute 02/01/2014 0.0  0.0 - 0.1 K/uL Final  . Sodium 02/01/2014 141  137 - 147 mEq/L Final  . Potassium 02/01/2014 4.4  3.7 - 5.3 mEq/L Final  . Chloride 02/01/2014 99  96 - 112 mEq/L Final  . CO2 02/01/2014 30  19 - 32 mEq/L Final  . Glucose, Bld 02/01/2014 81  70 - 99 mg/dL Final  . BUN 02/01/2014 19  6 - 23 mg/dL Final  . Creatinine, Ser 02/01/2014 0.92  0.50 - 1.10 mg/dL Final  . Calcium 02/01/2014 9.8  8.4 - 10.5 mg/dL Final  . Total Protein 02/01/2014 7.7  6.0 - 8.3 g/dL Final  . Albumin 02/01/2014 4.0  3.5 - 5.2 g/dL Final  . AST 02/01/2014 22  0 - 37 U/L Final  . ALT 02/01/2014 17  0 - 35 U/L Final  . Alkaline Phosphatase 02/01/2014 62  39 - 117 U/L Final  . Total Bilirubin 02/01/2014 0.5  0.3 - 1.2 mg/dL Final  . GFR calc non Af Amer 02/01/2014 56* >90 mL/min Final  . GFR calc Af Amer 02/01/2014 65* >90 mL/min Final   Comment: (NOTE)                          The eGFR has been calculated using the CKD EPI equation.                          This calculation has not been validated in all clinical situations.                          eGFR's persistently <90 mL/min signify possible Chronic Kidney                          Disease.  .  Anion gap 02/01/2014 12  5 - 15 Final    Urinalysis No results found for this basename: colorurine,  appearanceur,  labspec,  phurine,  glucoseu,  hgbur,  bilirubinur,  ketonesur,  proteinur,  urobilinogen,  nitrite,  leukocytesur      '@RADIOGRAPHY'$ : No results found.   ENDOSCOPY: 03/10/2013 Impression:  EGD findings;  Erosive reflux esophagitis with stricture at GE junction which was dilated with a balloon dilator to 18 mm.  Moderate size sliding hiatal hernia.  Small ulcer at gastric body along with antral erosions.  Colonoscopy findings;  Normal colonoscopy except few diverticula at sigmoid colon.  Suspect patient has bee   PATHOLOGY: Peripheral smear failed to reveal evidence of premature forms.   IMPRESSION:  #1. Polycythemia #2. History of gastroesophageal reflux disease with stricture, status post EGD and dilatation November 2014. #3. History of trigeminal neuralgia, status post gamma knife therapy in 1995.    PLAN:  #1. The patient was reassured. #2. Additional lab tests were done today to discriminate between primary and secondary polycythemia. #3. Followup in 2 weeks with CBC.  I appreciate the opportunity of sharing in her care.   Doroteo Bradford, MD 02/01/2014 7:38 PM   DISCLAIMER:  This note was dictated with voice recognition softwre.  Similar sounding words can inadvertently be transcribed inaccurately and may not be corrected upon review.

## 2014-02-02 LAB — FERRITIN: Ferritin: 76 ng/mL (ref 10–291)

## 2014-02-03 LAB — ERYTHROPOIETIN: ERYTHROPOIETIN: 13.7 m[IU]/mL (ref 2.6–18.5)

## 2014-02-04 LAB — JAK2 GENOTYPR: JAK2 GenotypR: NOT DETECTED

## 2014-02-05 LAB — BCR/ABL GENE REARRANGEMENT QNT, PCR
BCR ABL1 / ABL1 IS: 0 %
BCR ABL1/ABL1: 0 %

## 2014-02-05 LAB — P210 BCR-ABL 1: P210 BCR ABL1: NOT DETECTED

## 2014-02-05 LAB — P190 BCR-ABL 1: P190 BCR ABL1: NOT DETECTED

## 2014-02-15 ENCOUNTER — Encounter (HOSPITAL_BASED_OUTPATIENT_CLINIC_OR_DEPARTMENT_OTHER): Payer: Medicare Other

## 2014-02-15 ENCOUNTER — Encounter (HOSPITAL_COMMUNITY): Payer: Self-pay

## 2014-02-15 ENCOUNTER — Encounter (HOSPITAL_COMMUNITY): Payer: Medicare Other

## 2014-02-15 VITALS — BP 157/88 | HR 83 | Temp 98.1°F | Resp 16 | Wt 142.8 lb

## 2014-02-15 DIAGNOSIS — D500 Iron deficiency anemia secondary to blood loss (chronic): Secondary | ICD-10-CM | POA: Diagnosis not present

## 2014-02-15 DIAGNOSIS — R799 Abnormal finding of blood chemistry, unspecified: Secondary | ICD-10-CM

## 2014-02-15 DIAGNOSIS — Z79899 Other long term (current) drug therapy: Secondary | ICD-10-CM | POA: Diagnosis not present

## 2014-02-15 DIAGNOSIS — D751 Secondary polycythemia: Secondary | ICD-10-CM

## 2014-02-15 DIAGNOSIS — G500 Trigeminal neuralgia: Secondary | ICD-10-CM | POA: Diagnosis not present

## 2014-02-15 DIAGNOSIS — K219 Gastro-esophageal reflux disease without esophagitis: Secondary | ICD-10-CM | POA: Diagnosis not present

## 2014-02-15 LAB — CBC WITH DIFFERENTIAL/PLATELET
BASOS PCT: 1 % (ref 0–1)
Basophils Absolute: 0 10*3/uL (ref 0.0–0.1)
Eosinophils Absolute: 0.1 10*3/uL (ref 0.0–0.7)
Eosinophils Relative: 1 % (ref 0–5)
HCT: 45.7 % (ref 36.0–46.0)
Hemoglobin: 15.4 g/dL — ABNORMAL HIGH (ref 12.0–15.0)
LYMPHS ABS: 1.8 10*3/uL (ref 0.7–4.0)
Lymphocytes Relative: 27 % (ref 12–46)
MCH: 30.7 pg (ref 26.0–34.0)
MCHC: 33.7 g/dL (ref 30.0–36.0)
MCV: 91.2 fL (ref 78.0–100.0)
MONOS PCT: 9 % (ref 3–12)
Monocytes Absolute: 0.6 10*3/uL (ref 0.1–1.0)
NEUTROS ABS: 4 10*3/uL (ref 1.7–7.7)
NEUTROS PCT: 62 % (ref 43–77)
PLATELETS: 229 10*3/uL (ref 150–400)
RBC: 5.01 MIL/uL (ref 3.87–5.11)
RDW: 12.5 % (ref 11.5–15.5)
WBC: 6.5 10*3/uL (ref 4.0–10.5)

## 2014-02-15 NOTE — Progress Notes (Signed)
[name]  OFFICE PROGRESS NOTE  [place], [facility], MD  [place] [address]  DIAGNOSIS: Anemia due to blood loss, chronic - Plan: CBC with Differential  Secondary polycythemia  Chief Complaint  Patient presents with  . Polycythemia, secondary    CURRENT THERAPY: Undergoing workup for possible polycythemia.   INTERVAL HISTORY: [name] 80 y.o. female returns for follow-up after additional workup to rule out primary polycythemia. A diagnosis of chronic blood loss anemia was made in November 2014 associate with a gastric ulcer and GERD as well as the use of Fosamax. She underwent esophageal dilatation due to stricture formation and in September 2015 was found to have an elevated hemoglobin. 2 weeks ago the patient had an episode of abdominal cramping followed by mucousy bloody diarrhea. She took Senokot with relief. She's never had an episode like that in the past. She denies any melena, hematuria, vaginal bleeding, epistaxis, or hemoptysis. She also denies any fever, night sweats, or pruritus associated with showering.  MEDICAL HISTORY: Past Medical History  Diagnosis Date  . Hypertension   . Trigeminal neuralgia     INTERIM HISTORY: has Essential hypertension, benign; High cholesterol; and Anemia on her problem list.    ALLERGIES:  is allergic to sulfa antibiotics.  MEDICATIONS: has a current medication list which includes the following prescription(s): calcium carbonate-vitamin d, cholecalciferol, co-enzyme q-10, fish oil-omega-3 fatty acids, gabapentin, multivitamin with minerals, pantoprazole, plant sterol stanol-pantethine, pravastatin, and triamterene-hydrochlorothiazide.  SURGICAL HISTORY:  Past Surgical History  Procedure Laterality Date  . Breast cyst excision  60 yrs ago  . Colonoscopy with esophagogastroduodenoscopy (egd) N/A 03/10/2013    Procedure: COLONOSCOPY WITH ESOPHAGOGASTRODUODENOSCOPY (EGD);   Surgeon: Rogene Houston, MD;  Location: [facility];  Service: Endoscopy;  Laterality: N/A;  730    FAMILY HISTORY: family history is not on file.  SOCIAL HISTORY:  reports that she has never smoked. She does not have any smokeless tobacco history on file. She reports that she does not drink alcohol or use illicit drugs.  REVIEW OF SYSTEMS:  Other than that discussed above is noncontributory.  PHYSICAL EXAMINATION: ECOG PERFORMANCE STATUS: 1 - Symptomatic but completely ambulatory  Blood pressure 157/88, pulse 83, temperature 98.1 F (36.7 C), temperature source Oral, resp. rate 16, weight 142 lb 12.8 oz (64.774 kg), SpO2 95.00%.  GENERAL:alert, no distress and comfortable SKIN: skin color, texture, turgor are normal, no rashes or significant lesions EYES: PERLA; Conjunctiva are pink and non-injected, sclera clear SINUSES: No redness or tenderness over maxillary or ethmoid sinuses OROPHARYNX:no exudate, no erythema on lips, buccal mucosa, or tongue. NECK: supple, thyroid normal size, non-tender, without nodularity. No masses CHEST: Normal AP diameter with no breast masses. LYMPH:  no palpable lymphadenopathy in the cervical, axillary or inguinal LUNGS: clear to auscultation and percussion with normal breathing effort HEART: regular rate & rhythm and no murmurs. ABDOMEN:abdomen soft, non-tender and normal bowel sounds. Liver and spleen not enlarged. No CVA tenderness. MUSCULOSKELETAL:no cyanosis of digits and no clubbing. Range of motion normal.  NEURO: alert & oriented x 3 with fluent speech, no focal motor/sensory deficits   LABORATORY DATA: Lab on 02/15/2014  Component Date Value Ref Range Status  . WBC 02/15/2014 6.5  4.0 - 10.5 K/uL Final  . RBC 02/15/2014 5.01  3.87 - 5.11 MIL/uL Final  . Hemoglobin 02/15/2014 15.4* 12.0 - 15.0 g/dL Final  . HCT 02/15/2014 45.7  36.0 -  46.0 % Final  . MCV 02/15/2014 91.2  78.0 - 100.0 fL Final  . MCH 02/15/2014 30.7  26.0 - 34.0 pg Final    . MCHC 02/15/2014 33.7  30.0 - 36.0 g/dL Final  . RDW 02/15/2014 12.5  11.5 - 15.5 % Final  . Platelets 02/15/2014 229  150 - 400 K/uL Final  . Neutrophils Relative % 02/15/2014 62  43 - 77 % Final  . Neutro Abs 02/15/2014 4.0  1.7 - 7.7 K/uL Final  . Lymphocytes Relative 02/15/2014 27  12 - 46 % Final  . Lymphs Abs 02/15/2014 1.8  0.7 - 4.0 K/uL Final  . Monocytes Relative 02/15/2014 9  3 - 12 % Final  . Monocytes Absolute 02/15/2014 0.6  0.1 - 1.0 K/uL Final  . Eosinophils Relative 02/15/2014 1  0 - 5 % Final  . Eosinophils Absolute 02/15/2014 0.1  0.0 - 0.7 K/uL Final  . Basophils Relative 02/15/2014 1  0 - 1 % Final  . Basophils Absolute 02/15/2014 0.0  0.0 - 0.1 K/uL Final  Office Visit on 02/01/2014  Component Date Value Ref Range Status  . WBC 02/01/2014 6.8  4.0 - 10.5 K/uL Final  . RBC 02/01/2014 5.12* 3.87 - 5.11 MIL/uL Final  . Hemoglobin 02/01/2014 15.9* 12.0 - 15.0 g/dL Final  . HCT 02/01/2014 46.5* 36.0 - 46.0 % Final  . MCV 02/01/2014 90.8  78.0 - 100.0 fL Final  . MCH 02/01/2014 31.1  26.0 - 34.0 pg Final  . MCHC 02/01/2014 34.2  30.0 - 36.0 g/dL Final  . RDW 02/01/2014 12.5  11.5 - 15.5 % Final  . Platelets 02/01/2014 186  150 - 400 K/uL Final  . Neutrophils Relative % 02/01/2014 66  43 - 77 % Final  . Neutro Abs 02/01/2014 4.5  1.7 - 7.7 K/uL Final  . Lymphocytes Relative 02/01/2014 21  12 - 46 % Final  . Lymphs Abs 02/01/2014 1.4  0.7 - 4.0 K/uL Final  . Monocytes Relative 02/01/2014 10  3 - 12 % Final  . Monocytes Absolute 02/01/2014 0.7  0.1 - 1.0 K/uL Final  . Eosinophils Relative 02/01/2014 2  0 - 5 % Final  . Eosinophils Absolute 02/01/2014 0.1  0.0 - 0.7 K/uL Final  . Basophils Relative 02/01/2014 1  0 - 1 % Final  . Basophils Absolute 02/01/2014 0.0  0.0 - 0.1 K/uL Final  . Sodium 02/01/2014 141  137 - 147 mEq/L Final  . Potassium 02/01/2014 4.4  3.7 - 5.3 mEq/L Final  . Chloride 02/01/2014 99  96 - 112 mEq/L Final  . CO2 02/01/2014 30  19 - 32 mEq/L  Final  . Glucose, Bld 02/01/2014 81  70 - 99 mg/dL Final  . BUN 02/01/2014 19  6 - 23 mg/dL Final  . Creatinine, Ser 02/01/2014 0.92  0.50 - 1.10 mg/dL Final  . Calcium 02/01/2014 9.8  8.4 - 10.5 mg/dL Final  . Total Protein 02/01/2014 7.7  6.0 - 8.3 g/dL Final  . Albumin 02/01/2014 4.0  3.5 - 5.2 g/dL Final  . AST 02/01/2014 22  0 - 37 U/L Final  . ALT 02/01/2014 17  0 - 35 U/L Final  . Alkaline Phosphatase 02/01/2014 62  39 - 117 U/L Final  . Total Bilirubin 02/01/2014 0.5  0.3 - 1.2 mg/dL Final  . GFR calc non Af Amer 02/01/2014 56* >90 mL/min Final  . GFR calc Af Amer 02/01/2014 65* >90 mL/min Final   Comment: (NOTE)  The eGFR has been calculated using the CKD EPI equation.                          This calculation has not been validated in all clinical situations.                          eGFR's persistently <90 mL/min signify possible Chronic Kidney                          Disease.  . Anion gap 02/01/2014 12  5 - 15 Final  . Ferritin 02/01/2014 76  10 - 291 ng/mL Final   Performed at [facility]  . JAK2 GenotypR 02/01/2014 Not Detected   Final   Comment: (NOTE)                                   ** Normal Reference Range: Not Detected **                          Clinical Utility:                          The somatic acquired mutation affecting Janus Tyrosine Kinase 2 (JAK2                          V617F) in exon 14 is associated with myeloproliferative disorders                          (MPD).  JAK2 V617F has been found to be the most common molecular                          abnormality in patients with Polycythemia Vera (PV, >90%) or Essential                          Thombocythemia (ET, 35% - 70%).  The lowest frequency is found in IMF                          patients (chronic Idiopathic Myelofibrosis, 50%).  The presence of the                          JAK2 mutation causes activation of molecular signals that lead to                           proliferation of hematopoietic precursors outside of their normal                          pathways including erythropoietin-independent erythroid colony growth                          in most patients with PV and some patients with ET.  The JAK2 mutation                          is considered the main oncogenic event  responsible for PV development                          but its precise role in ET and IMF remains questionable and may                          suggest the requirement of other genetic events to induce these                          pathologies.  The absence of JAK2 V617F does not exclude other                          changes, including in the exon 12.                          Test Methodology:                          Patient DNA is assayed using allele specific PCR technology from                          Qiagen and is tested using the Roche Light Cycler Real Time PCR                          analyzer. This assay is reported as detected when >5% of cells show                          the presence of the JAK2 V617F mutation.                          This test was developed and its analytical performance characteristics                          have been determined by [facility].  It has not been cleared                          or approved by FDA. This assay has been validated pursuant to the CLIA                          regulations and is used for clinical purposes.                          Performed at [facility]  . BCR ABL1 / ABL1 02/01/2014 0.000   Final  . BCR ABL1 / ABL1 IS 02/01/2014 0.000   Final  . Interpretation - BCRQ 02/01/2014 REPORT   Final   Comment: (NOTE)                          The P190 and P210 BCR-ABL1 fusion transcripts are NOT                          detected.  Reverse transcription real-time PCR is performed for                          the P190 and P210 BCR-ABL1 transcripts associated with                           the t(9;22) chromosomal translocation. For P190,                          results are expressed as a percent ratio of BCR-ABL1                          to the ABL1 transcript, and further adjusted to the                          international scale (IS) for P210.                          Assay sensitivity is dependent on RNA quality and                          sample cellularity but is usually at least 4-logs                          below BCR-ABL1 baseline transcript levels. Reference                          range is 0.000% BCR-ABL1/ABL1.                          This test was developed and its analytical                          performance characteristics have been determined                          by [employer], [place], [place].                          It has not been cleared or approved by the FDA. This                          assay has been validated pursuant to the CLIA                          regulations and is used for clinical purposes.                                                                               Dr. Denton Lank, M.D.                          Performed  at [facility]  . Erythropoietin 02/01/2014 13.7  2.6 - 18.5 mIU/mL Final   Performed at [facility]  . P190 BCR ABL1 02/01/2014 Not Detected   Final   Performed at [facility]  . P210 BCR ABL1 02/01/2014 Not Detected   Final   Performed at [place]: None.  Urinalysis No results found for this basename: colorurine,  appearanceur,  labspec,  phurine,  glucoseu,  hgbur,  bilirubinur,  ketonesur,  proteinur,  urobilinogen,  nitrite,  leukocytesur    RADIOGRAPHIC STUDIES: No results found.  ASSESSMENT:  #1. No evidence of primary bone marrow disorder, secondary polycythemia. #2. History of gastroesophageal reflux disease with stricture, status post EGD and dilatation in November 2014. #3. History of trigeminal  neuralgia, status post Gamma knife therapy 1995 with remarkable response. #4. Episode of mucousy bloody diarrhea. #5. Ferritin of 76.   PLAN:  #1. Follow-up with gastroenterology. #2. Follow-up in 2 months with CBC, ferritin.   All questions were answered. The patient knows to call the clinic with any problems, questions or concerns. We can certainly see the patient much sooner if necessary.   I spent 25 minutes counseling the patient face to face. The total time spent in the appointment was 30 minutes.    Doroteo Bradford, MD 02/15/2014 12:28 PM  DISCLAIMER:  This note was dictated with voice recognition software.  Similar sounding words can inadvertently be transcribed inaccurately and may not be corrected upon review.

## 2014-02-15 NOTE — Progress Notes (Signed)
LABS FOR [facility] 

## 2014-02-15 NOTE — Patient Instructions (Signed)
[facility] Discharge Instructions  RECOMMENDATIONS MADE BY THE CONSULTANT AND ANY TEST RESULTS WILL BE SENT TO YOUR REFERRING PHYSICIAN.  EXAM FINDINGS BY THE PHYSICIAN TODAY AND SIGNS OR SYMPTOMS TO REPORT TO CLINIC OR PRIMARY PHYSICIAN: Exam and findings as discussed by Dr.Formanek.  MEDICATIONS PRESCRIBED:  Continue as prescribed.  INSTRUCTIONS/FOLLOW-UP: Return to clinic in 2 months for lab work and MD appointment. Call with any issues/concerns as needed.  Thank you for choosing [facility] to provide your oncology and hematology care.  To afford each patient quality time with our providers, please arrive at least 15 minutes before your scheduled appointment time.  With your help, our goal is to use those 15 minutes to complete the necessary work-up to ensure our physicians have the information they need to help with your evaluation and healthcare recommendations.    Effective January 1st, 2014, we ask that you re-schedule your appointment with our physicians should you arrive 10 or more minutes late for your appointment.  We strive to give you quality time with our providers, and arriving late affects you and other patients whose appointments are after yours.    Again, thank you for choosing [facility].  Our hope is that these requests will decrease the amount of time that you wait before being seen by our physicians.       _____________________________________________________________  Should you have questions after your visit to [facility], please contact our office at [phone] between the hours of 8:30 a.m. and 4:30 p.m.  Voicemails left after 4:30 p.m. will not be returned until the following business day.  For prescription refill requests, have your pharmacy contact our office with your prescription refill request.    _______________________________________________________________  We hope that we have given you  very good care.  You may receive a patient satisfaction survey in the mail, please complete it and return it as soon as possible.  We value your feedback!  _______________________________________________________________  Have you asked about our STAR program?  STAR stands for Survivorship Training and Rehabilitation, and this is a nationally recognized cancer care program that focuses on survivorship and rehabilitation.  Cancer and cancer treatments may cause problems, such as, pain, making you feel tired and keeping you from doing the things that you need or want to do. Cancer rehabilitation can help. Our goal is to reduce these troubling effects and help you have the best quality of life possible.  You may receive a survey from a nurse that asks questions about your current state of health.  Based on the survey results, all eligible patients will be referred to the [facility] for an evaluation so we can better serve you!  A frequently asked questions sheet is available upon request.

## 2014-03-26 ENCOUNTER — Other Ambulatory Visit (INDEPENDENT_AMBULATORY_CARE_PROVIDER_SITE_OTHER): Payer: Self-pay | Admitting: Internal Medicine

## 2014-04-14 ENCOUNTER — Other Ambulatory Visit (HOSPITAL_COMMUNITY): Payer: Medicare Other

## 2014-04-14 ENCOUNTER — Ambulatory Visit (HOSPITAL_COMMUNITY): Payer: Medicare Other | Admitting: Hematology & Oncology

## 2014-04-25 ENCOUNTER — Encounter (HOSPITAL_COMMUNITY): Payer: Self-pay | Admitting: Oncology

## 2014-04-25 DIAGNOSIS — D751 Secondary polycythemia: Secondary | ICD-10-CM

## 2014-04-25 HISTORY — DX: Secondary polycythemia: D75.1

## 2014-04-25 NOTE — Progress Notes (Signed)
Catalina Pizza, MD  [address]  [place] [address]  Polycythemia, secondary - Plan: CBC with Differential, CBC with Differential, CBC with Differential, Ferritin  URI (upper respiratory infection)  CURRENT THERAPY: Surveillance  INTERVAL HISTORY: [name] 81 y.o. female returns for followup of secondary polycythemia, JAK2 NEGATIVE, without evidence of primary bone marrow disorder.   I personally reviewed and went over laboratory results with the patient.  The results are noted within this dictation.  She reports that she feels well from a hematologic standpoint.  She denies any pruritis with showering, facial fullness, and erythema.    She notes that she has had an UI with a cough productive of yellow sputum x 3 weeks.  She reports that she has taken Mucinex DM and significant improvement in symptoms.  However, she occassionally has sputum that is yellow in color.  She denies any fevers of chills.  She denies a sore throat.  She denies any headaches or sinus pressure.  I have offered her an antibiotic, noting that she has allergies to sulfa products.  "Do you think I need one?"  I do not think she needs one given her symptoms are improved.  I think over the weekend she will notice her symptoms completely resolve.  However, she is advised to call us Monday, 05/03/2014 if her symptoms persist at which time I would be glad to call her in an antibiotic.   Hematologically, she denies any complaints and ROS questioning is negative.   Past Medical History  Diagnosis Date  . Hypertension   . Trigeminal neuralgia   . 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    Negative Jak2, BCR/ABL, normal epo level on 02/01/2014    has Essential hypertension, benign; High cholesterol; Polycythemia, secondary; and URI (upper respiratory infection) on her problem list.     is allergic to sulfa antibiotics.  Ms. Kuehne does not currently have medications on file.  Past Surgical History    Procedure Laterality Date  . Breast cyst excision  60 yrs ago  . Colonoscopy with esophagogastroduodenoscopy (egd) N/A 03/10/2013    Procedure: COLONOSCOPY WITH ESOPHAGOGASTRODUODENOSCOPY (EGD);  Surgeon: Malissa Hippo, MD;  Location: [facility];  Service: Endoscopy;  Laterality: N/A;  730    Denies any headaches, dizziness, double vision, fevers, chills, night sweats, nausea, vomiting, diarrhea, constipation, chest pain, heart palpitations, shortness of breath, blood in stool, black tarry stool, urinary pain, urinary burning, urinary frequency, hematuria.   PHYSICAL EXAMINATION  ECOG PERFORMANCE STATUS: 0 - Asymptomatic  Filed Vitals:   04/29/14 1000  BP: 175/83  Pulse: 81  Temp: 97.5 F (36.4 C)  Resp: 18    GENERAL:alert, healthy, no distress, well nourished, well developed, comfortable, cooperative and smiling SKIN: skin color, texture, turgor are normal, no rashes or significant lesions HEAD: Normocephalic, No masses, lesions, tenderness or abnormalities EYES: normal, PERRLA, EOMI, Conjunctiva are pink and non-injected EARS: External ears normal OROPHARYNX:no exudate, no erythema, lips, buccal mucosa, and tongue normal and mucous membranes are moist  NECK: supple, no adenopathy, thyroid normal size, non-tender, without nodularity, no stridor, non-tender, trachea midline LYMPH:  no palpable lymphadenopathy BREAST:not examined LUNGS: clear to auscultation  HEART: regular rate & rhythm ABDOMEN:abdomen soft, non-tender and normal bowel sounds BACK: Back symmetric, no curvature. EXTREMITIES:less then 2 second capillary refill, no joint deformities, effusion, or inflammation, no skin discoloration, no cyanosis  NEURO: alert & oriented x 3 with fluent speech, no focal motor/sensory deficits, gait normal  LABORATORY DATA: CBC    Component Value Date/Time   WBC 5.1 04/29/2014 1045   RBC 4.98 04/29/2014 1045   HGB 15.2* 04/29/2014 1045   HCT 46.3* 04/29/2014 1045    PLT 187 04/29/2014 1045   MCV 93.0 04/29/2014 1045   MCH 30.5 04/29/2014 1045   MCHC 32.8 04/29/2014 1045   RDW 12.7 04/29/2014 1045   LYMPHSABS 1.6 04/29/2014 1045   MONOABS 0.5 04/29/2014 1045   EOSABS 0.1 04/29/2014 1045   BASOSABS 0.0 04/29/2014 1045      Chemistry      Component Value Date/Time   NA 141 02/01/2014 1435   K 4.4 02/01/2014 1435   CL 99 02/01/2014 1435   CO2 30 02/01/2014 1435   BUN 19 02/01/2014 1435   CREATININE 0.92 02/01/2014 1435      Component Value Date/Time   CALCIUM 9.8 02/01/2014 1435   ALKPHOS 62 02/01/2014 1435   AST 22 02/01/2014 1435   ALT 17 02/01/2014 1435   BILITOT 0.5 02/01/2014 1435     Lab Results  Component Value Date   FERRITIN 76 02/01/2014   Results for Braithwaite, Mickaela A (MRN [id number]) as of 04/25/2014 12:45  Ref. Range 02/01/2014 14:35  Erythropoietin Latest Range: 2.6-18.5 mIU/mL 13.7   Results for Piekarski, Heike A (MRN [id number]) as of 04/25/2014 12:45  Ref. Range 02/01/2014 14:35  JAK2 GenotypR No range found Not Detected  BCR ABL1 / ABL1 No range found 0.000  BCR ABL1 / ABL1 IS No range found 0.000  P190 BCR ABL1 No range found Not Detected  P210 BCR ABL1 No range found Not Detected    ASSESSMENT AND PLAN:  Polycythemia, secondary Secondary polycythemia with mild elevation of HGB.  Negative JAK2 and BCR/ABL.  Epo level WNL.  Labs today: CBC diff, ferritin.  Labs in 3 months: CBC diff.  Labs in 6 months: CBC diff, ferritin.  Her labs will be performed across the street at [facility] and results will be faxed to [facility], [phone], per patient's request.  Rx with these orders provided to her. Return in 6 months for follow-up, sooner if necessary.  URI (upper respiratory infection) Improving clinically, patient reports.  If not better by Monday 05/03/2014, she will call the clinic and I will give her an antibiotic given the length of this infection (3 weeks).    THERAPY PLAN:  We will continue to monitor  labs.  All questions were answered. The patient knows to call the clinic with any problems, questions or concerns. We can certainly see the patient much sooner if necessary.  Patient and plan discussed with Dr. Loma Messing and she is in agreement with the aforementioned.   Kohler Pellerito 04/29/2014

## 2014-04-25 NOTE — Assessment & Plan Note (Addendum)
Secondary polycythemia with mild elevation of HGB.  Negative JAK2 and BCR/ABL.  Epo level WNL.  Labs today: CBC diff, ferritin.  Labs in 3 months: CBC diff.  Labs in 6 months: CBC diff, ferritin.  Her labs will be performed across the street at [facility] and results will be faxed to [facility], [phone], per patient's request.  Rx with these orders provided to her. Return in 6 months for follow-up, sooner if necessary.

## 2014-04-29 ENCOUNTER — Encounter (HOSPITAL_COMMUNITY): Payer: Self-pay | Admitting: Oncology

## 2014-04-29 ENCOUNTER — Encounter (HOSPITAL_COMMUNITY): Payer: Medicare Other | Attending: Hematology and Oncology

## 2014-04-29 ENCOUNTER — Encounter (HOSPITAL_COMMUNITY): Payer: Medicare Other | Attending: Hematology and Oncology | Admitting: Oncology

## 2014-04-29 VITALS — BP 175/83 | HR 81 | Temp 97.5°F | Resp 18 | Wt 141.9 lb

## 2014-04-29 DIAGNOSIS — D751 Secondary polycythemia: Secondary | ICD-10-CM | POA: Insufficient documentation

## 2014-04-29 DIAGNOSIS — G500 Trigeminal neuralgia: Secondary | ICD-10-CM | POA: Diagnosis not present

## 2014-04-29 DIAGNOSIS — Z79899 Other long term (current) drug therapy: Secondary | ICD-10-CM | POA: Diagnosis not present

## 2014-04-29 DIAGNOSIS — D500 Iron deficiency anemia secondary to blood loss (chronic): Secondary | ICD-10-CM | POA: Diagnosis present

## 2014-04-29 DIAGNOSIS — K219 Gastro-esophageal reflux disease without esophagitis: Secondary | ICD-10-CM | POA: Diagnosis not present

## 2014-04-29 DIAGNOSIS — J069 Acute upper respiratory infection, unspecified: Secondary | ICD-10-CM

## 2014-04-29 DIAGNOSIS — D649 Anemia, unspecified: Secondary | ICD-10-CM | POA: Insufficient documentation

## 2014-04-29 LAB — CBC WITH DIFFERENTIAL/PLATELET
BASOS PCT: 0 % (ref 0–1)
Basophils Absolute: 0 10*3/uL (ref 0.0–0.1)
EOS ABS: 0.1 10*3/uL (ref 0.0–0.7)
Eosinophils Relative: 2 % (ref 0–5)
HEMATOCRIT: 46.3 % — AB (ref 36.0–46.0)
Hemoglobin: 15.2 g/dL — ABNORMAL HIGH (ref 12.0–15.0)
Lymphocytes Relative: 31 % (ref 12–46)
Lymphs Abs: 1.6 10*3/uL (ref 0.7–4.0)
MCH: 30.5 pg (ref 26.0–34.0)
MCHC: 32.8 g/dL (ref 30.0–36.0)
MCV: 93 fL (ref 78.0–100.0)
Monocytes Absolute: 0.5 10*3/uL (ref 0.1–1.0)
Monocytes Relative: 9 % (ref 3–12)
Neutro Abs: 3 10*3/uL (ref 1.7–7.7)
Neutrophils Relative %: 58 % (ref 43–77)
Platelets: 187 10*3/uL (ref 150–400)
RBC: 4.98 MIL/uL (ref 3.87–5.11)
RDW: 12.7 % (ref 11.5–15.5)
WBC: 5.1 10*3/uL (ref 4.0–10.5)

## 2014-04-29 NOTE — Progress Notes (Signed)
LABS FOR [facility] 

## 2014-04-29 NOTE — Assessment & Plan Note (Signed)
Improving clinically, patient reports.  If not better by Monday 05/03/2014, she will call the clinic and I will give her an antibiotic given the length of this infection (3 weeks).

## 2014-04-29 NOTE — Patient Instructions (Addendum)
[facility] Discharge Instructions  RECOMMENDATIONS MADE BY THE CONSULTANT AND ANY TEST RESULTS WILL BE SENT TO YOUR REFERRING PHYSICIAN.  Labs today are great.  Hemoglobin 15.2 g/dL.  Labs in 3 months and 6 months at [facility].  Orders given on prescription pad: CBC diff. Return in 6 months for follow-up. Please call the [facility] on Monday (05/03/2014) if sputum production is not resolved.  Happy New Year!  CBC    Component Value Date/Time   WBC 5.1 04/29/2014 1045   RBC 4.98 04/29/2014 1045   HGB 15.2* 04/29/2014 1045   HCT 46.3* 04/29/2014 1045   PLT 187 04/29/2014 1045   MCV 93.0 04/29/2014 1045   MCH 30.5 04/29/2014 1045   MCHC 32.8 04/29/2014 1045   RDW 12.7 04/29/2014 1045   LYMPHSABS 1.6 04/29/2014 1045   MONOABS 0.5 04/29/2014 1045   EOSABS 0.1 04/29/2014 1045   BASOSABS 0.0 04/29/2014 1045    Thank you for choosing [facility] to provide your oncology and hematology care.  To afford each patient quality time with our providers, please arrive at least 15 minutes before your scheduled appointment time.  With your help, our goal is to use those 15 minutes to complete the necessary work-up to ensure our physicians have the information they need to help with your evaluation and healthcare recommendations.    Effective January 1st, 2014, we ask that you re-schedule your appointment with our physicians should you arrive 10 or more minutes late for your appointment.  We strive to give you quality time with our providers, and arriving late affects you and other patients whose appointments are after yours.    Again, thank you for choosing [facility].  Our hope is that these requests will decrease the amount of time that you wait before being seen by our physicians.       _____________________________________________________________  Should you have questions after your visit to [facility], please  contact our office at [phone] between the hours of 8:30 a.m. and 5:00 p.m.  Voicemails left after 4:30 p.m. will not be returned until the following business day.  For prescription refill requests, have your pharmacy contact our office with your prescription refill request.

## 2014-05-03 ENCOUNTER — Telehealth (HOSPITAL_COMMUNITY): Payer: Self-pay | Admitting: Oncology

## 2014-05-03 ENCOUNTER — Other Ambulatory Visit (HOSPITAL_COMMUNITY): Payer: Self-pay | Admitting: Oncology

## 2014-05-03 DIAGNOSIS — J069 Acute upper respiratory infection, unspecified: Secondary | ICD-10-CM

## 2014-05-03 MED ORDER — AMOXICILLIN-POT CLAVULANATE 875-125 MG PO TABS: 1.0000 | ORAL_TABLET | Freq: Two times a day (BID) | ORAL | Status: DC

## 2014-05-03 NOTE — Telephone Encounter (Signed)
Patient notified regarding Augmentin prescription.

## 2014-05-03 NOTE — Telephone Encounter (Signed)
Augmentin x 7 days escribed to [facility]

## 2014-08-18 ENCOUNTER — Encounter: Payer: Self-pay | Admitting: Oncology

## 2014-09-27 ENCOUNTER — Encounter (HOSPITAL_COMMUNITY): Payer: Self-pay | Admitting: *Deleted

## 2014-09-27 ENCOUNTER — Ambulatory Visit (HOSPITAL_COMMUNITY)
Admission: RE | Admit: 2014-09-27 | Discharge: 2014-09-27 | Disposition: A | Discharge status: Home / Self Care | Payer: Medicare Other | Source: Ambulatory Visit | Attending: Ophthalmology | Admitting: Ophthalmology

## 2014-09-27 ENCOUNTER — Encounter (HOSPITAL_COMMUNITY)
Admission: RE | Disposition: A | Discharge status: Home / Self Care | Payer: Self-pay | Source: Ambulatory Visit | Attending: Ophthalmology

## 2014-09-27 DIAGNOSIS — H4089 Other specified glaucoma: Secondary | ICD-10-CM | POA: Diagnosis not present

## 2014-09-27 HISTORY — PX: SLT LASER APPLICATION: SHX6099

## 2014-09-27 SURGERY — SLT LASER APPLICATION
Anesthesia: LOCAL | Laterality: Left

## 2014-09-27 MED ORDER — TETRACAINE HCL 0.5 % OP SOLN
1.0000 [drp] | Freq: Once | OPHTHALMIC | Status: AC
  Administered 2014-09-27: 1 [drp] via OPHTHALMIC

## 2014-09-27 MED ORDER — APRACLONIDINE HCL 1 % OP SOLN
1.0000 [drp] | OPHTHALMIC | Status: AC
  Administered 2014-09-27: 1 [drp] via OPHTHALMIC
  Administered 2014-09-27: 11:00:00 via OPHTHALMIC
  Administered 2014-09-27: 1 [drp] via OPHTHALMIC

## 2014-09-27 MED ORDER — PILOCARPINE HCL 1 % OP SOLN
2.0000 [drp] | Freq: Once | OPHTHALMIC | Status: AC
  Administered 2014-09-27: 2 [drp] via OPHTHALMIC

## 2014-09-27 MED ORDER — APRACLONIDINE HCL 1 % OP SOLN
OPHTHALMIC | Status: AC
  Filled 2014-09-27: qty 0.1

## 2014-09-27 MED ORDER — PILOCARPINE HCL 1 % OP SOLN
OPHTHALMIC | Status: AC
  Filled 2014-09-27: qty 15

## 2014-09-27 MED ORDER — TETRACAINE HCL 0.5 % OP SOLN
OPHTHALMIC | Status: AC
  Filled 2014-09-27: qty 2

## 2014-09-27 NOTE — Brief Op Note (Signed)
[name] 09/27/2014  Kaitlyn Simmondsarroll [name], MD  Pre-op Diagnosis:  uncontrolled glaucoma  OS  Post-op Diagnosis:  same  Yag laser self-test completed: Yes.    Indications:  Uncontrolled IOP on maximum tolerated meds  Procedure: SLT OS  Eye protection worn by staff:  Yes.   Laser In Use sign on door:  Yes.    Laser:  {LUMENIS YAG/SLT LASER  Power Setting:  0.9 mJ/burst Anatomical site treated:  Trabecular meshwork 360 degrees OS Number of applications:  117 Total energy delivered: 88.99 mJ Results:  Treatment completed successfully, pt tolerated procedure well  The patient was discharged home with instructions to continue all her current glaucoma medications in the un-operated eye, and discontinue all her current glaucoma medications, if any.  Patient was instructed to go to the office, as previously scheduled, for intraocular pressure:  Yes.    Patient verbalizes understanding of discharge instructions:  Yes.    Notes:  Gomoscopy:  Grade:   4 Open    Pigmentation:  minor    Synechiae:  none    Angle ressessions : none    Other:  none

## 2014-09-27 NOTE — H&P (Signed)
I have reviewed the pre printed H&P, the patient was re-examined, and I have identified no significant interval changes in the patient's medical condition.  There is no change in the plan of care since the history and physical of record. 

## 2014-09-27 NOTE — Discharge Instructions (Signed)
[name]  09/27/2014     Instructions    Activity: No Restrictions.   Diet: Resume Diet you were on at home.   Pain Medication: Tylenol if Needed.   CONTACT YOUR DOCTOR IF YOU HAVE PAIN, REDNESS IN YOUR EYE, OR DECREASED VISION.   Follow-up: 10/19/2014 at 3:45  with [name], MD.   Dr. Lita MainsHaines: [phone]     If you find that you cannot contact your physician, but feel that your signs and   Symptoms warrant a physician's attention, call the Emergency Room at   [phone].

## 2014-09-28 ENCOUNTER — Encounter (HOSPITAL_COMMUNITY): Payer: Self-pay | Admitting: Ophthalmology

## 2014-10-28 ENCOUNTER — Encounter (HOSPITAL_COMMUNITY): Payer: Medicare Other | Attending: Oncology | Admitting: Oncology

## 2014-10-28 ENCOUNTER — Encounter (HOSPITAL_COMMUNITY): Payer: Self-pay | Admitting: Oncology

## 2014-10-28 ENCOUNTER — Ambulatory Visit (HOSPITAL_COMMUNITY): Payer: Medicare Other | Admitting: Hematology & Oncology

## 2014-10-28 ENCOUNTER — Encounter (HOSPITAL_BASED_OUTPATIENT_CLINIC_OR_DEPARTMENT_OTHER): Payer: Medicare Other

## 2014-10-28 VITALS — BP 156/80 | HR 80 | Temp 98.0°F | Resp 18 | Wt 146.0 lb

## 2014-10-28 DIAGNOSIS — D751 Secondary polycythemia: Secondary | ICD-10-CM | POA: Insufficient documentation

## 2014-10-28 DIAGNOSIS — E780 Pure hypercholesterolemia: Secondary | ICD-10-CM | POA: Diagnosis not present

## 2014-10-28 DIAGNOSIS — I10 Essential (primary) hypertension: Secondary | ICD-10-CM | POA: Diagnosis not present

## 2014-10-28 DIAGNOSIS — G500 Trigeminal neuralgia: Secondary | ICD-10-CM | POA: Diagnosis not present

## 2014-10-28 LAB — CBC WITH DIFFERENTIAL/PLATELET
BASOS ABS: 0 10*3/uL (ref 0.0–0.1)
BASOS PCT: 1 % (ref 0–1)
EOS ABS: 0.1 10*3/uL (ref 0.0–0.7)
Eosinophils Relative: 2 % (ref 0–5)
HCT: 46.2 % — ABNORMAL HIGH (ref 36.0–46.0)
Hemoglobin: 15.5 g/dL — ABNORMAL HIGH (ref 12.0–15.0)
Lymphocytes Relative: 33 % (ref 12–46)
Lymphs Abs: 1.3 10*3/uL (ref 0.7–4.0)
MCH: 30.6 pg (ref 26.0–34.0)
MCHC: 33.5 g/dL (ref 30.0–36.0)
MCV: 91.3 fL (ref 78.0–100.0)
MONO ABS: 0.6 10*3/uL (ref 0.1–1.0)
MONOS PCT: 16 % — AB (ref 3–12)
Neutro Abs: 1.9 10*3/uL (ref 1.7–7.7)
Neutrophils Relative %: 48 % (ref 43–77)
Platelets: 169 10*3/uL (ref 150–400)
RBC: 5.06 MIL/uL (ref 3.87–5.11)
RDW: 12.6 % (ref 11.5–15.5)
WBC: 3.9 10*3/uL — ABNORMAL LOW (ref 4.0–10.5)

## 2014-10-28 LAB — FERRITIN: FERRITIN: 22 ng/mL (ref 11–307)

## 2014-10-28 NOTE — Progress Notes (Signed)
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 female returns for followup of secondary polycythemia, JAK2 NEGATIVE, without evidence of primary bone marrow disorder.   I personally reviewed and went over laboratory results with the patient.  The results are noted within this dictation.  She reports that she feels well from [name] hematologic standpoint.  She denies any pruritis with showering, facial fullness, and erythema.  She denies any B symptoms and her appetite is strong.  She denies any tiredness or fatigue during the day.  "I sleep well."  She denies being told she snores or she stops breathing.  She denies she denies any smoking history.  She denies any asthma or COPD.    I spent 12 hours yesterday freezing corn.   Hematologically, she denies any complaints and ROS questioning is negative.   Past Medical History  Diagnosis Date  . Hypertension   . Trigeminal neuralgia   . 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    Negative Jak2, BCR/ABL, normal epo level on 02/01/2014    has Essential hypertension, benign; High cholesterol; Polycythemia, secondary; and URI (upper respiratory infection) on her problem list.     is allergic to sulfa antibiotics.  [name] had no medications administered during this visit.  Past Surgical History  Procedure Laterality Date  . Breast cyst excision  60 yrs ago  . Colonoscopy with esophagogastroduodenoscopy (egd) N/[name] 03/10/2013    Procedure: COLONOSCOPY WITH ESOPHAGOGASTRODUODENOSCOPY (EGD);  Surgeon: Malissa HippoNajeeb U Rehman, MD;  [facility];  Service: Endoscopy;  Laterality: N/[name];  730  . Slt laser application Left 09/27/2014    Procedure: SLT LASER APPLICATION;  Surgeon: [name], MD;  Location: [facility];  Service: Ophthalmology;  Laterality: Left;     Denies any headaches, dizziness, double vision, fevers, chills, night sweats, nausea, vomiting, diarrhea, constipation, chest pain, heart palpitations, shortness of breath, blood in stool, black tarry stool, urinary pain, urinary burning, urinary frequency, hematuria.   PHYSICAL EXAMINATION  ECOG PERFORMANCE STATUS: 0 - Asymptomatic  Filed Vitals:   10/28/14 1126  BP: 156/80  Pulse: 80  Temp: 98 F (36.7 C)  Resp: 18    GENERAL:alert, healthy, no distress, well nourished, well developed, comfortable, cooperative and smiling SKIN: skin color, texture, turgor are normal, no rashes or significant lesions HEAD: Normocephalic, No masses, lesions, tenderness or abnormalities EYES: normal, PERRLA, EOMI, Conjunctiva are pink and non-injected EARS: External ears normal OROPHARYNX:no exudate, no erythema, lips, buccal mucosa, and tongue normal and mucous membranes are moist  NECK: supple, no adenopathy, thyroid normal size, non-tender, without nodularity, no stridor, non-tender, trachea midline LYMPH:  no palpable lymphadenopathy BREAST:not examined LUNGS: clear to auscultation  HEART: regular rate & rhythm ABDOMEN:abdomen soft, non-tender and normal bowel sounds BACK: Back symmetric, no curvature. EXTREMITIES:less then 2 second capillary refill, no joint deformities, effusion, or inflammation, no skin discoloration, no cyanosis  NEURO: alert & oriented x 3 with fluent speech, no focal motor/sensory deficits, gait normal   LABORATORY DATA: CBC    Component Value Date/Time   WBC 5.1 04/29/2014 1045   RBC 4.98 04/29/2014 1045   HGB 15.2* 04/29/2014 1045   HCT 46.3* 04/29/2014 1045   PLT 187 04/29/2014 1045   MCV 93.0 04/29/2014 1045   MCH 30.5 04/29/2014 1045   MCHC 32.8 04/29/2014 1045  RDW 12.7 04/29/2014 1045   LYMPHSABS 1.6 04/29/2014 1045   MONOABS 0.5 04/29/2014 1045   EOSABS 0.1 04/29/2014 1045   BASOSABS 0.0 04/29/2014 1045      Chemistry      Component Value  Date/Time   NA 141 02/01/2014 1435   K 4.4 02/01/2014 1435   CL 99 02/01/2014 1435   CO2 30 02/01/2014 1435   BUN 19 02/01/2014 1435   CREATININE 0.92 02/01/2014 1435      Component Value Date/Time   CALCIUM 9.8 02/01/2014 1435   ALKPHOS 62 02/01/2014 1435   AST 22 02/01/2014 1435   ALT 17 02/01/2014 1435   BILITOT 0.5 02/01/2014 1435     Lab Results  Component Value Date   FERRITIN 76 02/01/2014   Results for [name] (MRN [id number]) as of 04/25/2014 12:45  Ref. Range 02/01/2014 14:35  Erythropoietin Latest Range: 2.6-18.5 mIU/mL 13.7   Results for [name] (MRN [id number]) as of 04/25/2014 12:45  Ref. Range 02/01/2014 14:35  JAK2 GenotypR No range found Not Detected  BCR ABL1 / ABL1 No range found 0.000  BCR ABL1 / ABL1 IS No range found 0.000  P190 BCR ABL1 No range found Not Detected  P210 BCR ABL1 No range found Not Detected    ASSESSMENT AND PLAN:  Polycythemia, secondary Secondary polycythemia with mild elevation of HGB.  Negative JAK2 and BCR/ABL.  Epo level WNL.    Labs today: CBC diff, ferritin, JAK exon 12 and 13 with reflex to CALR/MPL.  Return in 6 months for follow-up.  Labs will be ordered at that time.      THERAPY PLAN:  We will continue to monitor labs.  All questions were answered. The patient knows to call the clinic with any problems, questions or concerns. We can certainly see the patient much sooner if necessary.  Patient and plan discussed with Dr. Loma Messing and she is in agreement with the aforementioned.   [name] 10/28/2014

## 2014-10-28 NOTE — Progress Notes (Signed)
Labs drawn

## 2014-10-28 NOTE — Assessment & Plan Note (Addendum)
Secondary polycythemia with mild elevation of HGB.  Negative JAK2 and BCR/ABL.  Epo level WNL.    Labs today: CBC diff, ferritin, JAK exon 12 and 13 with reflex to CALR/MPL.  Labs will be [facility] at [facility] and faxed to [facility] in 3 months: CBC diff.  Labs in 6 months: CBC diff, CMET  Return in 6 months for follow-up.

## 2014-10-28 NOTE — Patient Instructions (Addendum)
[facility] at [facility] Discharge Instructions  RECOMMENDATIONS MADE BY THE CONSULTANT AND ANY TEST RESULTS WILL BE SENT TO YOUR REFERRING PHYSICIAN.  Lab work and office visit today with [name]. Kefalas, PA-C  Lab [facility] October and [facility] 2017 ([facility] Return in 6 months for office visit with Dr. Galen ManilaPenland.  Thank you for choosing [facility] at [facility] to provide your oncology and hematology care.  To afford each patient quality time with our provider, please arrive at least 15 minutes before your scheduled appointment time.    You need to re-schedule your appointment should you arrive 10 or more minutes late.  We strive to give you quality time with our providers, and arriving late affects you and other patients whose appointments are after yours.  Also, if you no show three or more times for appointments you may be dismissed from the clinic at the providers discretion.     Again, thank you for choosing [facility].  Our hope is that these requests will decrease the amount of time that you wait before being seen by our physicians.       _____________________________________________________________  Should you have questions after your visit to [facility], please contact our office at [phone] between the hours of 8:30 a.m. and 4:30 p.m.  Voicemails left after 4:30 p.m. will not be returned until the following business day.  For prescription refill requests, have your pharmacy contact our office.

## 2014-10-29 ENCOUNTER — Other Ambulatory Visit (HOSPITAL_COMMUNITY): Payer: Self-pay | Admitting: Oncology

## 2014-11-05 LAB — JAK2 V617F, W REFLEX TO CALR/E12/MPL

## 2014-11-05 LAB — CALR + JAK2 E12-15 + MPL (REFLEXED)

## 2014-11-09 ENCOUNTER — Telehealth (HOSPITAL_COMMUNITY): Payer: Self-pay | Admitting: Emergency Medicine

## 2014-11-09 NOTE — Telephone Encounter (Signed)
Notified pt of labs results, very appreciative

## 2014-11-09 NOTE — Telephone Encounter (Signed)
-----   Message from Ellouise Newerhomas S Kefalas, PA-C sent at 11/08/2014  4:06 PM EDT ----- Negative.

## 2015-01-28 ENCOUNTER — Telehealth (HOSPITAL_COMMUNITY): Payer: Self-pay | Admitting: *Deleted

## 2015-01-28 NOTE — Telephone Encounter (Signed)
Orders written on Rx pad.  She can come by and pick-up when she is ready.  KEFALAS,THOMAS 01/28/2015 3:49 PM

## 2015-03-01 ENCOUNTER — Other Ambulatory Visit (INDEPENDENT_AMBULATORY_CARE_PROVIDER_SITE_OTHER): Payer: Self-pay | Admitting: Internal Medicine

## 2015-03-03 ENCOUNTER — Encounter (INDEPENDENT_AMBULATORY_CARE_PROVIDER_SITE_OTHER): Payer: Self-pay | Admitting: *Deleted

## 2015-03-03 NOTE — Telephone Encounter (Signed)
Apt has been scheduled for 05/05/15 at 10:15 am.

## 2015-04-28 ENCOUNTER — Encounter (HOSPITAL_COMMUNITY): Payer: Medicare Other | Attending: Hematology & Oncology | Admitting: Hematology & Oncology

## 2015-04-28 ENCOUNTER — Encounter (HOSPITAL_COMMUNITY): Payer: Self-pay | Admitting: Hematology & Oncology

## 2015-04-28 VITALS — BP 125/97 | HR 93 | Temp 97.8°F | Resp 16 | Wt 148.6 lb

## 2015-04-28 DIAGNOSIS — D751 Secondary polycythemia: Secondary | ICD-10-CM

## 2015-04-28 NOTE — Patient Instructions (Signed)
[facility] at [facility] Discharge Instructions  RECOMMENDATIONS MADE BY THE CONSULTANT AND ANY TEST [facility] WILL BE SENT TO YOUR REFERRING PHYSICIAN.  Return in one year for labs and to see the MD.     Thank you for choosing [facility] at [facility] to provide your oncology and hematology care.  To afford each patient quality time with our provider, please arrive at least 15 minutes before your scheduled appointment time.    You need to re-schedule your appointment should you arrive 10 or more minutes late.  We strive to give you quality time with our providers, and arriving late affects you and other patients whose appointments are after yours.  Also, if you no show three or more times for appointments you may be dismissed from the clinic at the providers discretion.     Again, thank you for choosing [facility].  Our hope is that these requests will decrease the amount of time that you wait before being seen by our physicians.       _____________________________________________________________  Should you have questions after your visit to [facility], please contact our office at [phone] between the hours of 8:30 a.m. and 4:30 p.m.  Voicemails left after 4:30 p.m. will not be returned until the following business day.  For prescription refill requests, have your pharmacy contact our office.

## 2015-04-28 NOTE — Progress Notes (Signed)
Kaitlyn Melena, MD  [address] [place] [address]  No diagnosis found.  CURRENT THERAPY: Surveillance  INTERVAL HISTORY: [name] 82 y.o. female returns for followup of secondary polycythemia, JAK2 NEGATIVE, without evidence of primary bone marrow disorder.   I personally reviewed and went over laboratory results with the patient.  The results are noted within this dictation.  [name] [facility] the [facility] Center alone today.  She states that her holidays were good, she feels good, her appetite is good, "too good," no problems with urination, bowels, and that her hands and feet feel good. Overall, she is doing very well.  She denies any new lumps or bumps anywhere.  She confirms that the last time she saw her gynecologist in [place], he performed [name] good breast exam on her. She no longer gets mammograms.   Past Medical History  Diagnosis Date  . Hypertension   . Trigeminal neuralgia   . 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    Negative Jak2, BCR/ABL, normal epo level on 02/01/2014    has Essential hypertension, benign; High cholesterol; Polycythemia, secondary; and URI (upper respiratory infection) on her problem list.     is allergic to sulfa antibiotics.  [name] had no medications administered during this visit.  Past Surgical History  Procedure Laterality Date  . Breast cyst excision  60 yrs ago  . Colonoscopy with esophagogastroduodenoscopy (egd) N/[name] 03/10/2013    Procedure: COLONOSCOPY WITH ESOPHAGOGASTRODUODENOSCOPY (EGD);  Surgeon: Kaitlyn Hippo, MD;  [facility];  Service: Endoscopy;  Laterality: N/[name];  730  . Slt laser application Left 09/27/2014    Procedure: SLT LASER APPLICATION;  Surgeon: Kaitlyn Simmonds, MD;  Location: [facility];  Service: Ophthalmology;  Laterality: Left;    Denies any headaches, dizziness, double vision, fevers, chills, night sweats, nausea, vomiting, diarrhea, constipation, chest pain, heart palpitations,  shortness of breath, blood in stool, black tarry stool, urinary pain, urinary burning, urinary frequency, hematuria.   14 point review of systems was performed and is negative except as detailed under history of present illness and above   PHYSICAL EXAMINATION  ECOG PERFORMANCE STATUS: 0 - Asymptomatic  Filed Vitals:   04/28/15 1142  BP: 125/97  Pulse: 93  Temp: 97.8 F (36.6 C)  Resp: 16    GENERAL:alert, healthy, no distress, well nourished, well developed, comfortable, cooperative and smiling SKIN: skin color, texture, turgor are normal, no rashes or significant lesions HEAD: Normocephalic, No masses, lesions, tenderness or abnormalities EYES: normal, PERRLA, EOMI, Conjunctiva are pink and non-injected EARS: External ears normal OROPHARYNX:no exudate, no erythema, lips, buccal mucosa, and tongue normal and mucous membranes are moist  NECK: supple, no adenopathy, thyroid normal size, non-tender, without nodularity, no stridor, non-tender, trachea midline LYMPH:  no palpable lymphadenopathy BREAST:not examined LUNGS: clear to auscultation  HEART: regular rate & rhythm ABDOMEN:abdomen soft, non-tender and normal bowel sounds BACK: Back symmetric, no curvature. EXTREMITIES:less then 2 second capillary refill, no joint deformities, effusion, or inflammation, no skin discoloration, no cyanosis  NEURO: alert & oriented x 3 with fluent speech, no focal motor/sensory deficits, gait normal   LABORATORY DATA: I have reviewed the data as listed.  CBC    Component Value Date/Time   WBC 3.9* 10/28/2014 1208   RBC 5.06 10/28/2014 1208   HGB 15.5* 10/28/2014 1208   HCT 46.2* 10/28/2014 1208   PLT 169 10/28/2014 1208   MCV 91.3 10/28/2014 1208   MCH 30.6 10/28/2014  1208   MCHC 33.5 10/28/2014 1208   RDW 12.6 10/28/2014 1208   LYMPHSABS 1.3 10/28/2014 1208   MONOABS 0.6 10/28/2014 1208   EOSABS 0.1 10/28/2014 1208   BASOSABS 0.0 10/28/2014 1208      Chemistry      Component  Value Date/Time   NA 141 02/01/2014 1435   K 4.4 02/01/2014 1435   CL 99 02/01/2014 1435   CO2 30 02/01/2014 1435   BUN 19 02/01/2014 1435   CREATININE 0.92 02/01/2014 1435      Component Value Date/Time   CALCIUM 9.8 02/01/2014 1435   ALKPHOS 62 02/01/2014 1435   AST 22 02/01/2014 1435   ALT 17 02/01/2014 1435   BILITOT 0.5 02/01/2014 1435     Lab Results  Component Value Date   FERRITIN 22 10/28/2014   Results for [name], [name] (MRN [id number]) as of 04/25/2014 12:45  Ref. Range 02/01/2014 14:35  Erythropoietin Latest Range: 2.6-18.5 mIU/mL 13.7   Results for [name], [name] (MRN [id number]) as of 04/25/2014 12:45  Ref. Range 02/01/2014 14:35  JAK2 GenotypR No range found Not Detected  BCR ABL1 / ABL1 No range found 0.000  BCR ABL1 / ABL1 IS No range found 0.000  P190 BCR ABL1 No range found Not Detected  P210 BCR ABL1 No range found Not Detected    ASSESSMENT AND PLAN:  Polycythemia, secondary  Counts have been stable for some time. There is no evidence of [name] genetic driver such as JAK2. We will continue to monitor labs. She has no symptoms consistent with P.Vera.  I feel comfortable pushing her appointments out to once yearly.  If her blood counts stay stable moving forward, I may just ask Dr. Margo AyeHall, her PCP, to follow her blood counts periodically.  Plan was discussed with the patient and she is agreeable. We will see her back in one year.  All questions were answered. The patient knows to call the clinic with any problems, questions or concerns. We can certainly see the patient much sooner if necessary.  This document serves as [name] record of services personally performed by Kaitlyn MessingShannon Celso Granja, MD. It was created on her behalf by Kaitlyn FothergillKatherine [name], [name] trained medical scribe. The creation of this record is based on the scribe's personal observations and the provider's statements to them. This document has been checked and approved by the attending provider.  I have reviewed  the above documentation for accuracy and completeness, and I agree with the above.  [name],Kaitlyn Merta Kristen, MD  04/28/2015

## 2015-05-05 ENCOUNTER — Encounter (INDEPENDENT_AMBULATORY_CARE_PROVIDER_SITE_OTHER): Payer: Self-pay | Admitting: Internal Medicine

## 2015-05-05 ENCOUNTER — Ambulatory Visit (INDEPENDENT_AMBULATORY_CARE_PROVIDER_SITE_OTHER): Payer: PPO | Admitting: Internal Medicine

## 2015-05-05 VITALS — BP 132/74 | HR 64 | Temp 97.4°F | Ht 63.0 in | Wt 149.7 lb

## 2015-05-05 DIAGNOSIS — K219 Gastro-esophageal reflux disease without esophagitis: Secondary | ICD-10-CM | POA: Diagnosis not present

## 2015-05-05 NOTE — Progress Notes (Addendum)
Subjective:    Patient ID: [name], female    DOB: 05-25-1928, 82 y.o.   MRN: [id number]  HPI Here today for f/u of her chronic GERD. Hx of secondary polycythemia.  She tells me she is doing good. She is seen at the [facility] for secondary polycythemia. She will follow up in one year. She takes the Protonix at night.  She also says she chews gum to control her acid reflux which helps. Appetite is good. No weight loss. She eats anything she wants without any problem. There has been no weight loss.   CBC    Component Value Date/Time   WBC 3.9* 10/28/2014 1208   RBC 5.06 10/28/2014 1208   HGB 15.5* 10/28/2014 1208   HCT 46.2* 10/28/2014 1208   PLT 169 10/28/2014 1208   MCV 91.3 10/28/2014 1208   MCH 30.6 10/28/2014 1208   MCHC 33.5 10/28/2014 1208   RDW 12.6 10/28/2014 1208   LYMPHSABS 1.3 10/28/2014 1208   MONOABS 0.6 10/28/2014 1208   EOSABS 0.1 10/28/2014 1208   BASOSABS 0.0 10/28/2014 1208       02/02/2015 H and H 15.3 and 45.4.    Treated for H. Pylori in 2014 with Pylera.   11/182014 EGD, ED & Colonoscopy  Indications: Patient is an 82 year old Caucasian female who was found to have iron deficiency anemia when she presented with exertional dyspnea. She has frequent but not daily heartburn and also complains of intermittent solid food dysphagia. She is on low-dose aspirin also takes alendronate every week. She does not take OTC NSAIDs. She is undergoing diagnostic evaluation.  Impression:  EGD findings; Erosive reflux esophagitis with stricture at GE junction which was dilated with a balloon dilator to 18 mm. Moderate size sliding hiatal hernia. Small ulcer at gastric body along with antral erosions.  Colonoscopy findings; Normal colonoscopy except few diverticula at sigmoid colon.     Review of Systems Past Medical History  Diagnosis Date  . Hypertension   .  Trigeminal neuralgia   . 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    Negative Jak2, BCR/ABL, normal epo level on 02/01/2014    Past Surgical History  Procedure Laterality Date  . Breast cyst excision  60 yrs ago  . Colonoscopy with esophagogastroduodenoscopy (egd) N/A 03/10/2013    Procedure: COLONOSCOPY WITH ESOPHAGOGASTRODUODENOSCOPY (EGD);  Surgeon: Malissa Hippo, MD;  [facility];  Service: Endoscopy;  Laterality: N/A;  730  . Slt laser application Left 09/27/2014    Procedure: SLT LASER APPLICATION;  Surgeon: Susa Simmonds, MD;  Location: [facility];  Service: Ophthalmology;  Laterality: Left;    Allergies  Allergen Reactions  . Sulfa Antibiotics     Feel crazy    Current Outpatient Prescriptions on File Prior to Visit  Medication Sig Dispense Refill  . Ascorbic Acid (VITAMIN C) 1000 MG tablet Take 1,000 mg by mouth daily.    Marland Kitchen aspirin 81 MG tablet Take 81 mg by mouth daily.    . Calcium Carbonate-Vitamin D (CALTRATE 600+D PO) Take 1 tablet by mouth daily.     . cholecalciferol (VITAMIN D) 1000 UNITS tablet Take 400 Units by mouth daily.    Marland Kitchen co-enzyme Q-10 30 MG capsule Take 30 mg by mouth daily.     . fish oil-omega-3 fatty acids 1000 MG capsule Take 1 g by mouth 2 (two) times daily.     Marland Kitchen gabapentin (NEURONTIN) 300 MG capsule Take 300 mg by mouth daily.    . Multiple  Vitamins-Minerals (MULTIVITAMIN WITH MINERALS) tablet Take 1 tablet by mouth daily.    . pantoprazole (PROTONIX) 40 MG tablet TAKE ONE TABLET BY MOUTH ONCE DAILY. 30 tablet 3  . Plant Sterol Stanol-Pantethine 450-75 MG TABS Take 1 tablet by mouth daily.     . pravastatin (PRAVACHOL) 40 MG tablet Take 40 mg by mouth daily.    Marland Kitchen. triamterene-hydrochlorothiazide (MAXZIDE-25) 37.5-25 MG per tablet Take 1 tablet by mouth daily.    . vitamin E 400 UNIT capsule Take 400 Units by mouth daily.    Marland Kitchen. OVER THE COUNTER MEDICATION Take 1 tablet by mouth daily. Patient takes keratin tablet     No current  facility-administered medications on file prior to visit.        Objective:   Physical Exam Blood pressure 132/74, pulse 64, temperature 97.4 F (36.3 C), height 5\' 3"  (1.6 m), weight 149 lb 11.2 oz (67.903 kg).  Alert and oriented. Skin warm and dry. Oral mucosa is moist.   . Sclera anicteric, conjunctivae is pink. Thyroid not enlarged. No cervical lymphadenopathy. Lungs clear. Heart regular rate and rhythm.  Abdomen is soft. Bowel sounds are positive. No hepatomegaly. No abdominal masses felt. No tenderness.  No edema to lower extremities.        Assessment & Plan:  Chronic GERD. She is doing good. GERD well controlled with Protonix. OV in 1 year.

## 2015-05-05 NOTE — Patient Instructions (Signed)
Continue the Protonix 

## 2015-06-03 ENCOUNTER — Other Ambulatory Visit (INDEPENDENT_AMBULATORY_CARE_PROVIDER_SITE_OTHER): Payer: Self-pay | Admitting: Internal Medicine

## 2015-06-06 DIAGNOSIS — I10 Essential (primary) hypertension: Secondary | ICD-10-CM | POA: Diagnosis not present

## 2015-06-06 DIAGNOSIS — E782 Mixed hyperlipidemia: Secondary | ICD-10-CM | POA: Diagnosis not present

## 2015-06-06 DIAGNOSIS — D509 Iron deficiency anemia, unspecified: Secondary | ICD-10-CM | POA: Diagnosis not present

## 2015-06-08 DIAGNOSIS — E782 Mixed hyperlipidemia: Secondary | ICD-10-CM | POA: Diagnosis not present

## 2015-06-08 DIAGNOSIS — I10 Essential (primary) hypertension: Secondary | ICD-10-CM | POA: Diagnosis not present

## 2015-06-08 DIAGNOSIS — D509 Iron deficiency anemia, unspecified: Secondary | ICD-10-CM | POA: Diagnosis not present

## 2015-06-28 DIAGNOSIS — R05 Cough: Secondary | ICD-10-CM | POA: Diagnosis not present

## 2015-06-28 DIAGNOSIS — J00 Acute nasopharyngitis [common cold]: Secondary | ICD-10-CM | POA: Diagnosis not present

## 2015-07-01 DIAGNOSIS — M25532 Pain in left wrist: Secondary | ICD-10-CM | POA: Diagnosis not present

## 2015-07-01 DIAGNOSIS — R2232 Localized swelling, mass and lump, left upper limb: Secondary | ICD-10-CM | POA: Diagnosis not present

## 2015-07-11 DIAGNOSIS — H401132 Primary open-angle glaucoma, bilateral, moderate stage: Secondary | ICD-10-CM | POA: Diagnosis not present

## 2015-07-18 DIAGNOSIS — H401132 Primary open-angle glaucoma, bilateral, moderate stage: Secondary | ICD-10-CM | POA: Diagnosis not present

## 2015-07-20 DIAGNOSIS — J0100 Acute maxillary sinusitis, unspecified: Secondary | ICD-10-CM | POA: Diagnosis not present

## 2015-08-22 DIAGNOSIS — Z6829 Body mass index (BMI) 29.0-29.9, adult: Secondary | ICD-10-CM | POA: Diagnosis not present

## 2015-08-22 DIAGNOSIS — Z01419 Encounter for gynecological examination (general) (routine) without abnormal findings: Secondary | ICD-10-CM | POA: Diagnosis not present

## 2015-08-23 DIAGNOSIS — H52223 Regular astigmatism, bilateral: Secondary | ICD-10-CM | POA: Diagnosis not present

## 2015-08-23 DIAGNOSIS — H2512 Age-related nuclear cataract, left eye: Secondary | ICD-10-CM | POA: Diagnosis not present

## 2015-09-05 DIAGNOSIS — R0602 Shortness of breath: Secondary | ICD-10-CM | POA: Diagnosis not present

## 2015-09-07 ENCOUNTER — Encounter (HOSPITAL_COMMUNITY)
Admission: RE | Admit: 2015-09-07 | Discharge: 2015-09-07 | Disposition: A | Discharge status: Home / Self Care | Payer: PPO | Source: Ambulatory Visit | Attending: Internal Medicine | Admitting: Internal Medicine

## 2015-09-07 DIAGNOSIS — D649 Anemia, unspecified: Secondary | ICD-10-CM | POA: Insufficient documentation

## 2015-09-08 ENCOUNTER — Encounter (HOSPITAL_COMMUNITY)
Admission: RE | Admit: 2015-09-08 | Discharge: 2015-09-08 | Disposition: A | Discharge status: Home / Self Care | Payer: PPO | Source: Ambulatory Visit | Attending: Internal Medicine | Admitting: Internal Medicine

## 2015-09-08 DIAGNOSIS — D649 Anemia, unspecified: Secondary | ICD-10-CM | POA: Diagnosis not present

## 2015-09-08 LAB — HEMOGLOBIN AND HEMATOCRIT, BLOOD
HEMATOCRIT: 25.5 % — AB (ref 36.0–46.0)
Hemoglobin: 7.4 g/dL — ABNORMAL LOW (ref 12.0–15.0)

## 2015-09-08 LAB — ABO/RH: ABO/RH(D): A NEG

## 2015-09-08 LAB — PREPARE RBC (CROSSMATCH)

## 2015-09-08 MED ORDER — SODIUM CHLORIDE 0.9 % IV SOLN
INTRAVENOUS | Status: DC
  Administered 2015-09-08: 08:00:00 via INTRAVENOUS

## 2015-09-09 LAB — TYPE AND SCREEN
ABO/RH(D): A NEG
Antibody Screen: NEGATIVE
Unit division: 0
Unit division: 0

## 2015-09-20 DIAGNOSIS — D509 Iron deficiency anemia, unspecified: Secondary | ICD-10-CM | POA: Diagnosis not present

## 2015-09-20 DIAGNOSIS — D649 Anemia, unspecified: Secondary | ICD-10-CM | POA: Diagnosis not present

## 2015-10-07 DIAGNOSIS — D509 Iron deficiency anemia, unspecified: Secondary | ICD-10-CM | POA: Diagnosis not present

## 2015-10-07 DIAGNOSIS — E782 Mixed hyperlipidemia: Secondary | ICD-10-CM | POA: Diagnosis not present

## 2015-10-14 DIAGNOSIS — E785 Hyperlipidemia, unspecified: Secondary | ICD-10-CM | POA: Diagnosis not present

## 2015-10-14 DIAGNOSIS — E782 Mixed hyperlipidemia: Secondary | ICD-10-CM | POA: Diagnosis not present

## 2015-10-14 DIAGNOSIS — D649 Anemia, unspecified: Secondary | ICD-10-CM | POA: Diagnosis not present

## 2015-10-14 DIAGNOSIS — D509 Iron deficiency anemia, unspecified: Secondary | ICD-10-CM | POA: Diagnosis not present

## 2015-10-14 DIAGNOSIS — I10 Essential (primary) hypertension: Secondary | ICD-10-CM | POA: Diagnosis not present

## 2016-02-06 ENCOUNTER — Other Ambulatory Visit (INDEPENDENT_AMBULATORY_CARE_PROVIDER_SITE_OTHER): Payer: Self-pay | Admitting: Internal Medicine

## 2016-02-29 DIAGNOSIS — D509 Iron deficiency anemia, unspecified: Secondary | ICD-10-CM | POA: Diagnosis not present

## 2016-02-29 DIAGNOSIS — B351 Tinea unguium: Secondary | ICD-10-CM | POA: Diagnosis not present

## 2016-02-29 DIAGNOSIS — Z6825 Body mass index (BMI) 25.0-25.9, adult: Secondary | ICD-10-CM | POA: Diagnosis not present

## 2016-02-29 DIAGNOSIS — D649 Anemia, unspecified: Secondary | ICD-10-CM | POA: Diagnosis not present

## 2016-03-05 ENCOUNTER — Other Ambulatory Visit (INDEPENDENT_AMBULATORY_CARE_PROVIDER_SITE_OTHER): Payer: Self-pay | Admitting: Internal Medicine

## 2016-03-07 DIAGNOSIS — H1851 Endothelial corneal dystrophy: Secondary | ICD-10-CM | POA: Diagnosis not present

## 2016-03-07 DIAGNOSIS — H40013 Open angle with borderline findings, low risk, bilateral: Secondary | ICD-10-CM | POA: Diagnosis not present

## 2016-03-07 DIAGNOSIS — H25812 Combined forms of age-related cataract, left eye: Secondary | ICD-10-CM | POA: Diagnosis not present

## 2016-03-07 DIAGNOSIS — H16221 Keratoconjunctivitis sicca, not specified as Sjogren's, right eye: Secondary | ICD-10-CM | POA: Diagnosis not present

## 2016-03-07 DIAGNOSIS — H35371 Puckering of macula, right eye: Secondary | ICD-10-CM | POA: Diagnosis not present

## 2016-03-07 DIAGNOSIS — Z961 Presence of intraocular lens: Secondary | ICD-10-CM | POA: Diagnosis not present

## 2016-03-14 DIAGNOSIS — H25812 Combined forms of age-related cataract, left eye: Secondary | ICD-10-CM | POA: Diagnosis not present

## 2016-03-14 DIAGNOSIS — H2512 Age-related nuclear cataract, left eye: Secondary | ICD-10-CM | POA: Diagnosis not present

## 2016-03-20 ENCOUNTER — Encounter (INDEPENDENT_AMBULATORY_CARE_PROVIDER_SITE_OTHER): Payer: Self-pay | Admitting: Internal Medicine

## 2016-03-20 ENCOUNTER — Encounter (INDEPENDENT_AMBULATORY_CARE_PROVIDER_SITE_OTHER): Payer: Self-pay

## 2016-04-03 DIAGNOSIS — H40013 Open angle with borderline findings, low risk, bilateral: Secondary | ICD-10-CM | POA: Diagnosis not present

## 2016-04-18 DIAGNOSIS — E782 Mixed hyperlipidemia: Secondary | ICD-10-CM | POA: Diagnosis not present

## 2016-04-18 DIAGNOSIS — I10 Essential (primary) hypertension: Secondary | ICD-10-CM | POA: Diagnosis not present

## 2016-04-20 DIAGNOSIS — K219 Gastro-esophageal reflux disease without esophagitis: Secondary | ICD-10-CM | POA: Diagnosis not present

## 2016-04-20 DIAGNOSIS — E782 Mixed hyperlipidemia: Secondary | ICD-10-CM | POA: Diagnosis not present

## 2016-04-20 DIAGNOSIS — Z0001 Encounter for general adult medical examination with abnormal findings: Secondary | ICD-10-CM | POA: Diagnosis not present

## 2016-04-20 DIAGNOSIS — I10 Essential (primary) hypertension: Secondary | ICD-10-CM | POA: Diagnosis not present

## 2016-04-20 DIAGNOSIS — D509 Iron deficiency anemia, unspecified: Secondary | ICD-10-CM | POA: Diagnosis not present

## 2016-05-02 ENCOUNTER — Other Ambulatory Visit (HOSPITAL_COMMUNITY): Payer: Self-pay | Admitting: Oncology

## 2016-05-02 DIAGNOSIS — D751 Secondary polycythemia: Secondary | ICD-10-CM

## 2016-05-03 ENCOUNTER — Encounter (HOSPITAL_COMMUNITY): Payer: PPO

## 2016-05-03 ENCOUNTER — Encounter (HOSPITAL_COMMUNITY): Payer: Self-pay | Admitting: Hematology & Oncology

## 2016-05-03 ENCOUNTER — Encounter (HOSPITAL_COMMUNITY): Payer: PPO | Attending: Hematology & Oncology | Admitting: Hematology & Oncology

## 2016-05-03 VITALS — BP 140/94 | HR 79 | Temp 97.5°F | Resp 16 | Wt 145.4 lb

## 2016-05-03 DIAGNOSIS — Z5189 Encounter for other specified aftercare: Secondary | ICD-10-CM | POA: Insufficient documentation

## 2016-05-03 DIAGNOSIS — D751 Secondary polycythemia: Secondary | ICD-10-CM | POA: Diagnosis not present

## 2016-05-03 DIAGNOSIS — Z9889 Other specified postprocedural states: Secondary | ICD-10-CM | POA: Insufficient documentation

## 2016-05-03 LAB — FERRITIN: Ferritin: 29 ng/mL (ref 11–307)

## 2016-05-03 LAB — CBC WITH DIFFERENTIAL/PLATELET
Basophils Absolute: 0 10*3/uL (ref 0.0–0.1)
Basophils Relative: 1 %
Eosinophils Absolute: 0.1 10*3/uL (ref 0.0–0.7)
Eosinophils Relative: 2 %
HEMATOCRIT: 47.6 % — AB (ref 36.0–46.0)
HEMOGLOBIN: 15.8 g/dL — AB (ref 12.0–15.0)
Lymphocytes Relative: 26 %
Lymphs Abs: 1.4 10*3/uL (ref 0.7–4.0)
MCH: 30.6 pg (ref 26.0–34.0)
MCHC: 33.2 g/dL (ref 30.0–36.0)
MCV: 92.1 fL (ref 78.0–100.0)
MONO ABS: 0.8 10*3/uL (ref 0.1–1.0)
MONOS PCT: 14 %
Neutro Abs: 3.1 10*3/uL (ref 1.7–7.7)
Neutrophils Relative %: 57 %
Platelets: 166 10*3/uL (ref 150–400)
RBC: 5.17 MIL/uL — ABNORMAL HIGH (ref 3.87–5.11)
RDW: 13.5 % (ref 11.5–15.5)
WBC: 5.4 10*3/uL (ref 4.0–10.5)

## 2016-05-03 NOTE — Progress Notes (Signed)
Kaitlyn MelenaZack Hall, MD  [address]  [place] [id number]  No diagnosis found.  CURRENT THERAPY: Surveillance  INTERVAL HISTORY: [name] 83 y.o. female returns for followup of secondary polycythemia, JAK2 NEGATIVE, without evidence of primary bone marrow disorder.   [name] returns to the Cancer Center alone today.  States she is doing well.   I personally reviewed and went over laboratory results with the patient.  The results are noted within this dictation.  She says she sees Dr. Margo AyeHall every 6 months.   She states that Dr. Mora ApplMcleod told her not to do anymore mammograms. He still does pap smears.   Denies headaches, denies blurry vision.  Denies chest pain, no sob, denies insomnia.  Appetite is good. "too good". "I eat like [name]"   Past Medical History:  Diagnosis Date  . Hypertension   . 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 Trigeminal neuralgia     has Essential hypertension, benign; High cholesterol; Polycythemia, secondary; and URI (upper respiratory infection) on her problem list.     is allergic to sulfa antibiotics.  [name] had no medications administered during this visit.  Past Surgical History:  Procedure Laterality Date  . BREAST CYST EXCISION  60 yrs ago  . COLONOSCOPY WITH ESOPHAGOGASTRODUODENOSCOPY (EGD) N/[name] 03/10/2013   Procedure: COLONOSCOPY WITH ESOPHAGOGASTRODUODENOSCOPY (EGD);  Surgeon: Malissa HippoNajeeb U Rehman, MD;  [facility];  Service: Endoscopy;  Laterality: N/[name];  730  . SLT LASER APPLICATION Left 09/27/2014   Procedure: SLT LASER APPLICATION;  Surgeon: [name], MD;  Location: [facility];  Service: Ophthalmology;  Laterality: Left;    Review of Systems  Constitutional: Negative.        Appetite is good. "too good" "I eat like [name]"  HENT: Negative.   Eyes: Negative.  Negative for blurred vision.  Respiratory: Negative.  Negative for shortness of  breath.   Cardiovascular: Negative.  Negative for chest pain.  Gastrointestinal: Negative.   Genitourinary: Negative.   Musculoskeletal: Negative.   Skin: Negative.   Neurological: Negative.  Negative for headaches.  Endo/Heme/Allergies: Negative.   Psychiatric/Behavioral: Negative.  The patient does not have insomnia.   All other systems reviewed and are negative. 14 point review of systems was performed and is negative except as detailed under history of present illness and above    PHYSICAL EXAMINATION  ECOG PERFORMANCE STATUS: 0 - Asymptomatic  Vitals:   05/03/16 1215  BP: (!) 140/94  Pulse: 79  Resp: 16  Temp: 97.5 F (36.4 C)   Physical Exam  Constitutional: She is oriented to person, place, and time and well-developed, well-nourished, and in no distress.  HENT:  Head: Normocephalic and atraumatic.  Mouth/Throat: No oropharyngeal exudate.  Eyes: Conjunctivae and EOM are normal. Pupils are equal, round, and reactive to light. No scleral icterus.  Neck: Normal range of motion. Neck supple.  Cardiovascular: Normal rate, regular rhythm and normal heart sounds.   Pulmonary/Chest: Effort normal and breath sounds normal.  Abdominal: Soft. Bowel sounds are normal. She exhibits no distension and no mass. There is no tenderness. There is no rebound and no guarding.  Musculoskeletal: Normal range of motion. She exhibits no edema.  Lymphadenopathy:    She has no cervical adenopathy.  Neurological: She is alert and oriented to person, place, and time. Gait normal.  Skin: Skin is warm and dry.  Psychiatric: Mood, memory, affect  and judgment normal.  Nursing note and vitals reviewed.   LABORATORY DATA: I have reviewed the data as listed.  CBC    Component Value Date/Time   WBC 5.4 05/03/2016 1050   RBC 5.17 (H) 05/03/2016 1050   HGB 15.8 (H) 05/03/2016 1050   HCT 47.6 (H) 05/03/2016 1050   PLT 166 05/03/2016 1050   MCV 92.1 05/03/2016 1050   MCH 30.6 05/03/2016 1050    MCHC 33.2 05/03/2016 1050   RDW 13.5 05/03/2016 1050   LYMPHSABS 1.4 05/03/2016 1050   MONOABS 0.8 05/03/2016 1050   EOSABS 0.1 05/03/2016 1050   BASOSABS 0.0 05/03/2016 1050      Chemistry      Component Value Date/Time   NA 141 02/01/2014 1435   K 4.4 02/01/2014 1435   CL 99 02/01/2014 1435   CO2 30 02/01/2014 1435   BUN 19 02/01/2014 1435   CREATININE 0.92 02/01/2014 1435      Component Value Date/Time   CALCIUM 9.8 02/01/2014 1435   ALKPHOS 62 02/01/2014 1435   AST 22 02/01/2014 1435   ALT 17 02/01/2014 1435   BILITOT 0.5 02/01/2014 1435     Lab Results  Component Value Date   FERRITIN 29 05/03/2016   Results for [name], [name] (MRN [id number]) as of 04/25/2014 12:45  Ref. Range 02/01/2014 14:35  Erythropoietin Latest Range: 2.6-18.5 mIU/mL 13.7   Results for [name] (MRN [id number]) as of 04/25/2014 12:45  Ref. Range 02/01/2014 14:35  JAK2 GenotypR No range found Not Detected  BCR ABL1 / ABL1 No range found 0.000  BCR ABL1 / ABL1 IS No range found 0.000  P190 BCR ABL1 No range found Not Detected  P210 BCR ABL1 No range found Not Detected    ASSESSMENT AND PLAN:  Polycythemia, secondary  Counts have been stable for some time. There is no evidence of [name] genetic driver such as JAK2. We will continue to monitor labs. She has no symptoms consistent with P.Vera.  Labs reviewed. Results noted above. She would prefer to follow with Dr. Margo Aye. I will refer her back to her PCP for ongoing monitoring.   I will send her back to Dr. Margo Aye since her blood work has been stable.   She will follow up as needed.   All questions were answered. The patient knows to call the clinic with any problems, questions or concerns. We can certainly see the patient much sooner if necessary.  This document serves as [name] record of services personally performed by Loma Messing, MD. It was created on her behalf by Theron Arista, [name] trained medical scribe. The creation of this record is  based on the scribe's personal observations and the provider's statements to them. This document has been checked and approved by the attending provider.   I have reviewed the above documentation for accuracy and completeness, and I agree with the above. Arvil Chaco, MD   05/03/2016

## 2016-05-03 NOTE — Patient Instructions (Signed)
[facility] at [facility] Discharge Instructions  RECOMMENDATIONS MADE BY THE CONSULTANT AND ANY TEST RESULTS WILL BE SENT TO YOUR REFERRING PHYSICIAN.  You were seen today by Dr. Galen ManilaPenland. You are being discharged back to Dr. Margo AyeHall, follow up with us as needed.   Thank you for choosing [facility] at [facility] to provide your oncology and hematology care.  To afford each patient quality time with our provider, please arrive at least 15 minutes before your scheduled appointment time.    If you have a lab appointment with the Cancer Center please come in thru the  Main Entrance and check in at the main information desk  You need to re-schedule your appointment should you arrive 10 or more minutes late.  We strive to give you quality time with our providers, and arriving late affects you and other patients whose appointments are after yours.  Also, if you no show three or more times for appointments you may be dismissed from the clinic at the providers discretion.     Again, thank you for choosing [facility].  Our hope is that these requests will decrease the amount of time that you wait before being seen by our physicians.       _____________________________________________________________  Should you have questions after your visit to [facility], please contact our office at [phone] between the hours of 8:30 a.m. and 4:30 p.m.  Voicemails left after 4:30 p.m. will not be returned until the following business day.  For prescription refill requests, have your pharmacy contact our office.       Resources For Cancer Patients and their Caregivers ? American Cancer Society: Can assist with transportation, wigs, general needs, runs Look Good Feel Better.        [phone] ? Cancer Care: Provides financial assistance, online support groups, medication/co-pay assistance.  1-800-813-HOPE 715 195 4072(4673) ? [facility] Assists [place] cancer patients and their families through emotional , educational and financial support.  [phone] ? [place] DSS Where to apply for food stamps, Medicaid and utility assistance. [phone] [phone] ? [facility]: Transportation to medical appointments. 408-451-8689(757)086-5094 ? Social Security Administration: May apply for disability if have a Stage IV cancer. 216-556-8736([phone] ? [place] Aging, Disability and Transit Services: Assists with nutrition, care and transit needs. [phone]  Cancer Center Support Programs: @10RELATIVEDAYS @ > Cancer Support Group  2nd Tuesday of the month 1pm-2pm, Journey Room  > Creative Journey  3rd Tuesday of the month 1130am-1pm, Journey Room  > Look Good Feel Better  1st Wednesday of the month 10am-12 noon, Journey Room (Call American Cancer Society to register [phone])

## 2016-05-04 ENCOUNTER — Ambulatory Visit (INDEPENDENT_AMBULATORY_CARE_PROVIDER_SITE_OTHER): Payer: PPO | Admitting: Internal Medicine

## 2016-05-04 ENCOUNTER — Encounter (INDEPENDENT_AMBULATORY_CARE_PROVIDER_SITE_OTHER): Payer: Self-pay | Admitting: Internal Medicine

## 2016-05-04 VITALS — BP 104/84 | HR 70 | Temp 97.9°F | Ht 63.0 in | Wt 144.6 lb

## 2016-05-04 DIAGNOSIS — K219 Gastro-esophageal reflux disease without esophagitis: Secondary | ICD-10-CM

## 2016-05-04 NOTE — Patient Instructions (Signed)
Continue the Protonix. OV in 1 year.  

## 2016-05-04 NOTE — Progress Notes (Signed)
Subjective:    Patient ID: [name], female    DOB: 01/19/1929, 83 y.o.   MRN: [id number] 149 HPIHere today for f/u for her chronic GERD.  Takes Protonix at night.  Chew gum to control her acid reflux. She tells me she is doing good. Acid reflux is controlled. Usually has a BM daily. No melena or BRRB. Good family support.  Hx of secondary polypcythemia and [place] fill[place].  Received a blood transfusion in May for a hemoglobin of 7.4   Treated for H. Pylori in 2014 with Pylera.    CBC    Component Value Date/Time   WBC 5.4 05/03/2016 1050   RBC 5.17 (H) 05/03/2016 1050   HGB 15.8 (H) 05/03/2016 1050   HCT 47.6 (H) 05/03/2016 1050   PLT 166 05/03/2016 1050   MCV 92.1 05/03/2016 1050   MCH 30.6 05/03/2016 1050   MCHC 33.2 05/03/2016 1050   RDW 13.5 05/03/2016 1050   LYMPHSABS 1.4 05/03/2016 1050   MONOABS 0.8 05/03/2016 1050   EOSABS 0.1 05/03/2016 1050   BASOSABS 0.0 05/03/2016 1050     11/182014 EGD, ED & Colonoscopy  Indications: Patient is an 83 year old Caucasian female who was found to have iron deficiency anemia when she presented with exertional dyspnea. She has frequent but not daily heartburn and also complains of intermittent solid food dysphagia. She is on low-dose aspirin also takes alendronate every week. She does not take OTC NSAIDs. She is undergoing diagnostic evaluation.  Impression:  EGD findings; Erosive reflux esophagitis with stricture at GE junction which was dilated with a balloon dilator to 18 mm. Moderate size sliding hiatal hernia. Small ulcer at gastric body along with antral erosions.  Colonoscopy findings; Normal colonoscopy except few diverticula at sigmoid colon.   CBC    Component Value Date/Time   WBC 5.4 05/03/2016 1050   RBC 5.17 (H) 05/03/2016 1050   HGB 15.8 (H) 05/03/2016 1050   HCT 47.6 (H) 05/03/2016  1050   PLT 166 05/03/2016 1050   MCV 92.1 05/03/2016 1050   MCH 30.6 05/03/2016 1050   MCHC 33.2 05/03/2016 1050   RDW 13.5 05/03/2016 1050   LYMPHSABS 1.4 05/03/2016 1050   MONOABS 0.8 05/03/2016 1050   EOSABS 0.1 05/03/2016 1050   BASOSABS 0.0 05/03/2016 1050     Review of Systems Past Medical History:  Diagnosis Date  . Hypertension   . 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 Trigeminal neuralgia     Past Surgical History:  Procedure Laterality Date  . BREAST CYST EXCISION  60 yrs ago  . COLONOSCOPY WITH ESOPHAGOGASTRODUODENOSCOPY (EGD) N/A 03/10/2013   Procedure: COLONOSCOPY WITH ESOPHAGOGASTRODUODENOSCOPY (EGD);  Surgeon: Malissa HippoNajeeb U Rehman, MD;  [facility];  Service: Endoscopy;  Laterality: N/A;  730  . SLT LASER APPLICATION Left 09/27/2014   Procedure: SLT LASER APPLICATION;  Surgeon: [name], MD;  Location: [facility];  Service: Ophthalmology;  Laterality: Left;    Allergies  Allergen Reactions  . Sulfa Antibiotics     Feel crazy    Current Outpatient Prescriptions on File Prior to Visit  Medication Sig Dispense Refill  . Ascorbic Acid (VITAMIN C) 1000 MG tablet Take 1,000 mg by mouth daily.    Marland Kitchen. aspirin 81 MG tablet Take 81 mg by mouth daily.    . Calcium Carbonate-Vitamin D (CALTRATE 600+D PO) Take 1 tablet by mouth daily.     .Marland Kitchen  cholecalciferol (VITAMIN D) 1000 UNITS tablet Take 400 Units by mouth daily.    Marland Kitchen co-enzyme Q-10 30 MG capsule Take 30 mg by mouth daily.     . fish oil-omega-3 fatty acids 1000 MG capsule Take 1 g by mouth 2 (two) times daily.     Marland Kitchen gabapentin (NEURONTIN) 300 MG capsule Take 300 mg by mouth daily.    . Multiple Vitamins-Minerals (MULTIVITAMIN WITH MINERALS) tablet Take 1 tablet by mouth daily.    Marland Kitchen PANTETHINE PO Take 450 mg by mouth.    . pantoprazole (PROTONIX) 40 MG tablet TAKE ONE TABLET BY MOUTH ONCE DAILY. 30 tablet 5  . Plant Sterol Stanol-Pantethine 450-75 MG  TABS Take 1 tablet by mouth daily.     . pravastatin (PRAVACHOL) 40 MG tablet Take 40 mg by mouth daily.    Marland Kitchen triamterene-hydrochlorothiazide (MAXZIDE-25) 37.5-25 MG per tablet Take 1 tablet by mouth daily.    . vitamin E 400 UNIT capsule Take 400 Units by mouth daily.     No current facility-administered medications on file prior to visit.        Objective:   Physical Exam Blood pressure 104/84, pulse 70, temperature 97.9 F (36.6 C), height 5\' 3"  (1.6 m), weight 144 lb 9.6 oz (65.6 kg). Alert and oriented. Skin warm and dry. Oral mucosa is moist.   . Sclera anicteric, conjunctivae is pink. Thyroid not enlarged. No cervical lymphadenopathy. Lungs clear. Heart regular rate and rhythm.  Abdomen is soft. Bowel sounds are positive. No hepatomegaly. No abdominal masses felt. No tenderness.  No edema to lower extremities.         Assessment & Plan:     Chronic GERD. She is doing good. GERD well controlled with Protonix. OV in 1 year.

## 2016-05-24 ENCOUNTER — Encounter (HOSPITAL_COMMUNITY): Payer: Self-pay | Admitting: Hematology & Oncology

## 2016-07-22 DIAGNOSIS — I2510 Atherosclerotic heart disease of native coronary artery without angina pectoris: Secondary | ICD-10-CM

## 2016-07-22 HISTORY — DX: Atherosclerotic heart disease of native coronary artery without angina pectoris: I25.10

## 2016-08-17 DIAGNOSIS — H40013 Open angle with borderline findings, low risk, bilateral: Secondary | ICD-10-CM | POA: Diagnosis not present

## 2016-08-17 DIAGNOSIS — Z961 Presence of intraocular lens: Secondary | ICD-10-CM | POA: Diagnosis not present

## 2016-08-17 DIAGNOSIS — H1851 Endothelial corneal dystrophy: Secondary | ICD-10-CM | POA: Diagnosis not present

## 2016-08-17 DIAGNOSIS — H35371 Puckering of macula, right eye: Secondary | ICD-10-CM | POA: Diagnosis not present

## 2016-08-17 DIAGNOSIS — H25812 Combined forms of age-related cataract, left eye: Secondary | ICD-10-CM | POA: Diagnosis not present

## 2016-08-17 DIAGNOSIS — H16221 Keratoconjunctivitis sicca, not specified as Sjogren's, right eye: Secondary | ICD-10-CM | POA: Diagnosis not present

## 2016-08-19 ENCOUNTER — Emergency Department (HOSPITAL_COMMUNITY)
Admission: EM | Admit: 2016-08-19 | Discharge: 2016-08-19 | Disposition: A | Discharge status: Home / Self Care | Payer: PPO | Source: Home / Self Care | Attending: Emergency Medicine | Admitting: Emergency Medicine

## 2016-08-19 ENCOUNTER — Encounter (HOSPITAL_COMMUNITY): Payer: Self-pay | Admitting: *Deleted

## 2016-08-19 ENCOUNTER — Emergency Department (HOSPITAL_COMMUNITY): Payer: PPO

## 2016-08-19 DIAGNOSIS — N3001 Acute cystitis with hematuria: Secondary | ICD-10-CM | POA: Insufficient documentation

## 2016-08-19 DIAGNOSIS — R1031 Right lower quadrant pain: Secondary | ICD-10-CM | POA: Diagnosis not present

## 2016-08-19 DIAGNOSIS — Z7982 Long term (current) use of aspirin: Secondary | ICD-10-CM

## 2016-08-19 DIAGNOSIS — I2510 Atherosclerotic heart disease of native coronary artery without angina pectoris: Secondary | ICD-10-CM | POA: Diagnosis not present

## 2016-08-19 DIAGNOSIS — A419 Sepsis, unspecified organism: Secondary | ICD-10-CM | POA: Diagnosis not present

## 2016-08-19 DIAGNOSIS — I480 Paroxysmal atrial fibrillation: Secondary | ICD-10-CM | POA: Diagnosis not present

## 2016-08-19 DIAGNOSIS — N179 Acute kidney failure, unspecified: Secondary | ICD-10-CM | POA: Diagnosis not present

## 2016-08-19 DIAGNOSIS — R0600 Dyspnea, unspecified: Secondary | ICD-10-CM | POA: Diagnosis not present

## 2016-08-19 DIAGNOSIS — I5031 Acute diastolic (congestive) heart failure: Secondary | ICD-10-CM | POA: Diagnosis not present

## 2016-08-19 DIAGNOSIS — Z79899 Other long term (current) drug therapy: Secondary | ICD-10-CM | POA: Diagnosis not present

## 2016-08-19 DIAGNOSIS — D751 Secondary polycythemia: Secondary | ICD-10-CM | POA: Diagnosis not present

## 2016-08-19 DIAGNOSIS — E785 Hyperlipidemia, unspecified: Secondary | ICD-10-CM | POA: Diagnosis not present

## 2016-08-19 DIAGNOSIS — N200 Calculus of kidney: Secondary | ICD-10-CM | POA: Diagnosis not present

## 2016-08-19 DIAGNOSIS — R109 Unspecified abdominal pain: Secondary | ICD-10-CM | POA: Diagnosis not present

## 2016-08-19 DIAGNOSIS — I110 Hypertensive heart disease with heart failure: Secondary | ICD-10-CM

## 2016-08-19 DIAGNOSIS — I712 Thoracic aortic aneurysm, without rupture: Secondary | ICD-10-CM | POA: Diagnosis not present

## 2016-08-19 DIAGNOSIS — I081 Rheumatic disorders of both mitral and tricuspid valves: Secondary | ICD-10-CM | POA: Diagnosis not present

## 2016-08-19 DIAGNOSIS — I248 Other forms of acute ischemic heart disease: Secondary | ICD-10-CM | POA: Diagnosis not present

## 2016-08-19 DIAGNOSIS — J9601 Acute respiratory failure with hypoxia: Secondary | ICD-10-CM | POA: Diagnosis not present

## 2016-08-19 DIAGNOSIS — N390 Urinary tract infection, site not specified: Secondary | ICD-10-CM | POA: Diagnosis not present

## 2016-08-19 DIAGNOSIS — E876 Hypokalemia: Secondary | ICD-10-CM | POA: Diagnosis not present

## 2016-08-19 DIAGNOSIS — D6959 Other secondary thrombocytopenia: Secondary | ICD-10-CM | POA: Diagnosis not present

## 2016-08-19 LAB — LIPASE, BLOOD: Lipase: 18 U/L (ref 11–51)

## 2016-08-19 LAB — URINALYSIS, ROUTINE W REFLEX MICROSCOPIC
Bilirubin Urine: NEGATIVE
Glucose, UA: NEGATIVE mg/dL
Ketones, ur: NEGATIVE mg/dL
Nitrite: POSITIVE — AB
Protein, ur: NEGATIVE mg/dL
Specific Gravity, Urine: 1.006 (ref 1.005–1.030)
pH: 6 (ref 5.0–8.0)

## 2016-08-19 LAB — COMPREHENSIVE METABOLIC PANEL
ALT: 38 U/L (ref 14–54)
AST: 67 U/L — ABNORMAL HIGH (ref 15–41)
Albumin: 4.5 g/dL (ref 3.5–5.0)
Alkaline Phosphatase: 79 U/L (ref 38–126)
Anion gap: 13 (ref 5–15)
BUN: 22 mg/dL — ABNORMAL HIGH (ref 6–20)
CO2: 29 mmol/L (ref 22–32)
Calcium: 9.9 mg/dL (ref 8.9–10.3)
Chloride: 99 mmol/L — ABNORMAL LOW (ref 101–111)
Creatinine, Ser: 1.17 mg/dL — ABNORMAL HIGH (ref 0.44–1.00)
GFR calc Af Amer: 48 mL/min — ABNORMAL LOW (ref >60)
GFR calc non Af Amer: 41 mL/min — ABNORMAL LOW (ref >60)
Glucose, Bld: 101 mg/dL — ABNORMAL HIGH (ref 65–99)
Potassium: 3.4 mmol/L — ABNORMAL LOW (ref 3.5–5.1)
Sodium: 141 mmol/L (ref 135–145)
Total Bilirubin: 1.6 mg/dL — ABNORMAL HIGH (ref 0.3–1.2)
Total Protein: 7.8 g/dL (ref 6.5–8.1)

## 2016-08-19 LAB — CBC WITH DIFFERENTIAL/PLATELET
Basophils Absolute: 0 10*3/uL (ref 0.0–0.1)
Basophils Relative: 0 %
Eosinophils Absolute: 0 10*3/uL (ref 0.0–0.7)
Eosinophils Relative: 0 %
HCT: 50.9 % — ABNORMAL HIGH (ref 36.0–46.0)
Hemoglobin: 16.9 g/dL — ABNORMAL HIGH (ref 12.0–15.0)
Lymphocytes Relative: 7 %
Lymphs Abs: 0.2 10*3/uL — ABNORMAL LOW (ref 0.7–4.0)
MCH: 30.6 pg (ref 26.0–34.0)
MCHC: 33.2 g/dL (ref 30.0–36.0)
MCV: 92.2 fL (ref 78.0–100.0)
Monocytes Absolute: 0 10*3/uL — ABNORMAL LOW (ref 0.1–1.0)
Monocytes Relative: 1 %
Neutro Abs: 3.1 10*3/uL (ref 1.7–7.7)
Neutrophils Relative %: 92 %
Platelets: 101 10*3/uL — ABNORMAL LOW (ref 150–400)
RBC: 5.52 MIL/uL — ABNORMAL HIGH (ref 3.87–5.11)
RDW: 12.8 % (ref 11.5–15.5)
WBC: 3.4 10*3/uL — ABNORMAL LOW (ref 4.0–10.5)

## 2016-08-19 LAB — LACTIC ACID, PLASMA
Lactic Acid, Venous: 4.7 mmol/L (ref 0.5–1.9)
Lactic Acid, Venous: 2.3 mmol/L (ref 0.5–1.9)

## 2016-08-19 MED ORDER — IOPAMIDOL (ISOVUE-300) INJECTION 61%
100.0000 mL | Freq: Once | INTRAVENOUS | Status: AC | PRN
  Administered 2016-08-19: 100 mL via INTRAVENOUS

## 2016-08-19 MED ORDER — DEXTROSE 5 % IV SOLN
1.0000 g | Freq: Once | INTRAVENOUS | Status: AC
  Administered 2016-08-19: 1 g via INTRAVENOUS
  Filled 2016-08-19: qty 10

## 2016-08-19 MED ORDER — ACETAMINOPHEN 500 MG PO TABS
1000.0000 mg | ORAL_TABLET | Freq: Once | ORAL | Status: AC
  Administered 2016-08-19: 1000 mg via ORAL
  Filled 2016-08-19: qty 2

## 2016-08-19 MED ORDER — ACETAMINOPHEN 500 MG PO TABS
ORAL_TABLET | ORAL | Status: AC
  Administered 2016-08-19: 19:00:00
  Filled 2016-08-19: qty 2

## 2016-08-19 MED ORDER — SODIUM CHLORIDE 0.9 % IV BOLUS (SEPSIS): 1000.0000 mL | Freq: Once | INTRAVENOUS | Status: DC

## 2016-08-19 MED ORDER — MORPHINE SULFATE (PF) 4 MG/ML IV SOLN
4.0000 mg | Freq: Once | INTRAVENOUS | Status: AC
  Administered 2016-08-19: 4 mg via INTRAVENOUS
  Filled 2016-08-19: qty 1

## 2016-08-19 MED ORDER — CEPHALEXIN 500 MG PO CAPS: 500.0000 mg | ORAL_CAPSULE | Freq: Three times a day (TID) | ORAL | 0 refills | Status: DC

## 2016-08-19 MED ORDER — ONDANSETRON HCL 4 MG/2ML IJ SOLN
4.0000 mg | Freq: Once | INTRAMUSCULAR | Status: AC
  Administered 2016-08-19: 4 mg via INTRAVENOUS
  Filled 2016-08-19: qty 2

## 2016-08-19 MED ORDER — SODIUM CHLORIDE 0.9 % IV BOLUS (SEPSIS)
1000.0000 mL | Freq: Once | INTRAVENOUS | Status: AC
  Administered 2016-08-19: 1000 mL via INTRAVENOUS

## 2016-08-19 NOTE — ED Notes (Signed)
Pt wheeled to waiting room. Pt verbalized understanding of discharge instructions.   

## 2016-08-19 NOTE — ED Notes (Signed)
CRITICAL VALUE ALERT  Critical value received:  Lactic   Date of notification:  08/19/2016  Time of notification:  2150  Critical value read back: yes  Nurse who received alert:  Casimiro Needle, RN  MD notified (1st page):  Dr Juleen China

## 2016-08-19 NOTE — ED Notes (Signed)
Pt ambulatory to waiting room. Pt verbalized understanding of discharge instructions.   

## 2016-08-19 NOTE — ED Triage Notes (Signed)
Pt having RLQ pain starting around 1400 today. Pt has had diarrhea, denies n/v.

## 2016-08-19 NOTE — ED Notes (Signed)
CRITICAL VALUE ALERT  Critical value received:  Lactic acid 4.7  Date of notification: 08/19/2016  Time of notification:  19:36  Critical value read back: yes  Nurse who received alert:  Alena Bills   MD notified (1st page):  Dr Juleen China  Time of first page: 19:37 MD notified (2nd page):  Time of second page:  Responding MD:  Dr Juleen China  Time MD responded:  19:37

## 2016-08-19 NOTE — ED Notes (Signed)
This rn went in to start fluid and antibiotics and the patient had been taken to ct, will start when she returns

## 2016-08-20 ENCOUNTER — Encounter (HOSPITAL_COMMUNITY): Payer: Self-pay | Admitting: Emergency Medicine

## 2016-08-20 ENCOUNTER — Emergency Department (HOSPITAL_COMMUNITY): Payer: PPO

## 2016-08-20 ENCOUNTER — Inpatient Hospital Stay (HOSPITAL_COMMUNITY)
Admission: EM | Admit: 2016-08-20 | Discharge: 2016-08-25 | DRG: 871 | Disposition: A | Discharge status: Home Health | Payer: PPO | Attending: Internal Medicine | Admitting: Internal Medicine

## 2016-08-20 ENCOUNTER — Inpatient Hospital Stay (HOSPITAL_COMMUNITY): Payer: PPO

## 2016-08-20 DIAGNOSIS — I482 Chronic atrial fibrillation: Secondary | ICD-10-CM | POA: Diagnosis not present

## 2016-08-20 DIAGNOSIS — J189 Pneumonia, unspecified organism: Secondary | ICD-10-CM

## 2016-08-20 DIAGNOSIS — I517 Cardiomegaly: Secondary | ICD-10-CM | POA: Diagnosis not present

## 2016-08-20 DIAGNOSIS — J449 Chronic obstructive pulmonary disease, unspecified: Secondary | ICD-10-CM | POA: Diagnosis not present

## 2016-08-20 DIAGNOSIS — A419 Sepsis, unspecified organism: Principal | ICD-10-CM | POA: Diagnosis present

## 2016-08-20 DIAGNOSIS — D696 Thrombocytopenia, unspecified: Secondary | ICD-10-CM | POA: Diagnosis present

## 2016-08-20 DIAGNOSIS — I2510 Atherosclerotic heart disease of native coronary artery without angina pectoris: Secondary | ICD-10-CM | POA: Diagnosis not present

## 2016-08-20 DIAGNOSIS — J9601 Acute respiratory failure with hypoxia: Secondary | ICD-10-CM | POA: Diagnosis present

## 2016-08-20 DIAGNOSIS — I700 Atherosclerosis of aorta: Secondary | ICD-10-CM | POA: Diagnosis not present

## 2016-08-20 DIAGNOSIS — J9811 Atelectasis: Secondary | ICD-10-CM | POA: Diagnosis not present

## 2016-08-20 DIAGNOSIS — Z7982 Long term (current) use of aspirin: Secondary | ICD-10-CM | POA: Diagnosis not present

## 2016-08-20 DIAGNOSIS — I4891 Unspecified atrial fibrillation: Secondary | ICD-10-CM | POA: Diagnosis present

## 2016-08-20 DIAGNOSIS — N281 Cyst of kidney, acquired: Secondary | ICD-10-CM | POA: Diagnosis not present

## 2016-08-20 DIAGNOSIS — R0603 Acute respiratory distress: Secondary | ICD-10-CM | POA: Diagnosis not present

## 2016-08-20 DIAGNOSIS — I5030 Unspecified diastolic (congestive) heart failure: Secondary | ICD-10-CM | POA: Diagnosis not present

## 2016-08-20 DIAGNOSIS — D751 Secondary polycythemia: Secondary | ICD-10-CM | POA: Diagnosis present

## 2016-08-20 DIAGNOSIS — R5383 Other fatigue: Secondary | ICD-10-CM | POA: Diagnosis not present

## 2016-08-20 DIAGNOSIS — E785 Hyperlipidemia, unspecified: Secondary | ICD-10-CM | POA: Diagnosis present

## 2016-08-20 DIAGNOSIS — I7121 Aneurysm of the ascending aorta, without rupture: Secondary | ICD-10-CM | POA: Diagnosis present

## 2016-08-20 DIAGNOSIS — I712 Thoracic aortic aneurysm, without rupture: Secondary | ICD-10-CM | POA: Diagnosis present

## 2016-08-20 DIAGNOSIS — J9690 Respiratory failure, unspecified, unspecified whether with hypoxia or hypercapnia: Secondary | ICD-10-CM | POA: Diagnosis not present

## 2016-08-20 DIAGNOSIS — R0600 Dyspnea, unspecified: Secondary | ICD-10-CM | POA: Diagnosis not present

## 2016-08-20 DIAGNOSIS — I081 Rheumatic disorders of both mitral and tricuspid valves: Secondary | ICD-10-CM | POA: Diagnosis not present

## 2016-08-20 DIAGNOSIS — N200 Calculus of kidney: Secondary | ICD-10-CM | POA: Diagnosis present

## 2016-08-20 DIAGNOSIS — I5031 Acute diastolic (congestive) heart failure: Secondary | ICD-10-CM | POA: Diagnosis not present

## 2016-08-20 DIAGNOSIS — Z79899 Other long term (current) drug therapy: Secondary | ICD-10-CM | POA: Diagnosis not present

## 2016-08-20 DIAGNOSIS — R945 Abnormal results of liver function studies: Secondary | ICD-10-CM | POA: Diagnosis not present

## 2016-08-20 DIAGNOSIS — E876 Hypokalemia: Secondary | ICD-10-CM | POA: Diagnosis not present

## 2016-08-20 DIAGNOSIS — I340 Nonrheumatic mitral (valve) insufficiency: Secondary | ICD-10-CM | POA: Diagnosis not present

## 2016-08-20 DIAGNOSIS — K7689 Other specified diseases of liver: Secondary | ICD-10-CM | POA: Diagnosis not present

## 2016-08-20 DIAGNOSIS — R0682 Tachypnea, not elsewhere classified: Secondary | ICD-10-CM | POA: Diagnosis not present

## 2016-08-20 DIAGNOSIS — N390 Urinary tract infection, site not specified: Secondary | ICD-10-CM | POA: Diagnosis present

## 2016-08-20 DIAGNOSIS — I10 Essential (primary) hypertension: Secondary | ICD-10-CM | POA: Diagnosis not present

## 2016-08-20 DIAGNOSIS — I248 Other forms of acute ischemic heart disease: Secondary | ICD-10-CM | POA: Diagnosis present

## 2016-08-20 DIAGNOSIS — N179 Acute kidney failure, unspecified: Secondary | ICD-10-CM | POA: Diagnosis present

## 2016-08-20 DIAGNOSIS — J810 Acute pulmonary edema: Secondary | ICD-10-CM | POA: Diagnosis not present

## 2016-08-20 DIAGNOSIS — I480 Paroxysmal atrial fibrillation: Secondary | ICD-10-CM | POA: Diagnosis present

## 2016-08-20 DIAGNOSIS — I110 Hypertensive heart disease with heart failure: Secondary | ICD-10-CM | POA: Diagnosis present

## 2016-08-20 DIAGNOSIS — D6959 Other secondary thrombocytopenia: Secondary | ICD-10-CM | POA: Diagnosis present

## 2016-08-20 DIAGNOSIS — R0902 Hypoxemia: Secondary | ICD-10-CM

## 2016-08-20 DIAGNOSIS — Z7189 Other specified counseling: Secondary | ICD-10-CM | POA: Diagnosis not present

## 2016-08-20 DIAGNOSIS — R0602 Shortness of breath: Secondary | ICD-10-CM | POA: Diagnosis not present

## 2016-08-20 LAB — I-STAT CHEM 8, ED
BUN: 24 mg/dL — ABNORMAL HIGH (ref 6–20)
CREATININE: 1.3 mg/dL — AB (ref 0.44–1.00)
Calcium, Ion: 1.08 mmol/L — ABNORMAL LOW (ref 1.15–1.40)
Chloride: 99 mmol/L — ABNORMAL LOW (ref 101–111)
Glucose, Bld: 96 mg/dL (ref 65–99)
HEMATOCRIT: 45 % (ref 36.0–46.0)
HEMOGLOBIN: 15.3 g/dL — AB (ref 12.0–15.0)
Potassium: 3.7 mmol/L (ref 3.5–5.1)
Sodium: 140 mmol/L (ref 135–145)
TCO2: 26 mmol/L (ref 0–100)

## 2016-08-20 LAB — CBC WITH DIFFERENTIAL/PLATELET
BAND NEUTROPHILS: 24 %
Basophils Absolute: 0 10*3/uL (ref 0.0–0.1)
Basophils Relative: 0 %
EOS PCT: 1 %
Eosinophils Absolute: 0 10*3/uL (ref 0.0–0.7)
HCT: 45.8 % (ref 36.0–46.0)
Hemoglobin: 14.9 g/dL (ref 12.0–15.0)
Lymphocytes Relative: 26 %
Lymphs Abs: 0.6 10*3/uL — ABNORMAL LOW (ref 0.7–4.0)
MCH: 30.3 pg (ref 26.0–34.0)
MCHC: 32.5 g/dL (ref 30.0–36.0)
MCV: 93.1 fL (ref 78.0–100.0)
METAMYELOCYTES PCT: 4 %
MYELOCYTES: 8 %
Monocytes Absolute: 0 10*3/uL — ABNORMAL LOW (ref 0.1–1.0)
Monocytes Relative: 1 %
Neutro Abs: 1.8 10*3/uL (ref 1.7–7.7)
Neutrophils Relative %: 36 %
Platelets: 79 10*3/uL — ABNORMAL LOW (ref 150–400)
RBC: 4.92 MIL/uL (ref 3.87–5.11)
RDW: 13.4 % (ref 11.5–15.5)
WBC: 2.4 10*3/uL — AB (ref 4.0–10.5)

## 2016-08-20 LAB — COMPREHENSIVE METABOLIC PANEL
ALK PHOS: 121 U/L (ref 38–126)
ALT: 40 U/L (ref 14–54)
AST: 51 U/L — AB (ref 15–41)
Albumin: 3.9 g/dL (ref 3.5–5.0)
Anion gap: 14 (ref 5–15)
BUN: 22 mg/dL — AB (ref 6–20)
CALCIUM: 8.7 mg/dL — AB (ref 8.9–10.3)
CO2: 26 mmol/L (ref 22–32)
CREATININE: 1.27 mg/dL — AB (ref 0.44–1.00)
Chloride: 100 mmol/L — ABNORMAL LOW (ref 101–111)
GFR calc non Af Amer: 37 mL/min — ABNORMAL LOW (ref >60)
GFR, EST AFRICAN AMERICAN: 43 mL/min — AB (ref >60)
Glucose, Bld: 98 mg/dL (ref 65–99)
Potassium: 3.8 mmol/L (ref 3.5–5.1)
SODIUM: 140 mmol/L (ref 135–145)
Total Bilirubin: 1 mg/dL (ref 0.3–1.2)
Total Protein: 7.1 g/dL (ref 6.5–8.1)

## 2016-08-20 LAB — MRSA PCR SCREENING: MRSA BY PCR: NEGATIVE

## 2016-08-20 LAB — BRAIN NATRIURETIC PEPTIDE: B Natriuretic Peptide: 514 pg/mL — ABNORMAL HIGH (ref 0.0–100.0)

## 2016-08-20 LAB — LACTIC ACID, PLASMA
Lactic Acid, Venous: 3.1 mmol/L (ref 0.5–1.9)
Lactic Acid, Venous: 2.7 mmol/L (ref 0.5–1.9)

## 2016-08-20 LAB — D-DIMER, QUANTITATIVE: D-Dimer, Quant: >20 ug/mL-FEU — ABNORMAL HIGH (ref 0.00–0.50)

## 2016-08-20 LAB — I-STAT TROPONIN, ED: Troponin i, poc: 0.15 ng/mL (ref 0.00–0.08)

## 2016-08-20 LAB — TROPONIN I: Troponin I: 0.38 ng/mL (ref <0.03)

## 2016-08-20 MED ORDER — ENOXAPARIN SODIUM 80 MG/0.8ML ~~LOC~~ SOLN
70.0000 mg | SUBCUTANEOUS | Status: DC
  Administered 2016-08-20: 70 mg via SUBCUTANEOUS
  Filled 2016-08-20: qty 0.8

## 2016-08-20 MED ORDER — SODIUM CHLORIDE 0.9 % IV BOLUS (SEPSIS)
1000.0000 mL | Freq: Once | INTRAVENOUS | Status: AC
  Administered 2016-08-20: 1000 mL via INTRAVENOUS

## 2016-08-20 MED ORDER — IOPAMIDOL (ISOVUE-370) INJECTION 76%
75.0000 mL | Freq: Once | INTRAVENOUS | Status: AC | PRN
  Administered 2016-08-20: 75 mL via INTRAVENOUS

## 2016-08-20 MED ORDER — DIGOXIN 0.25 MG/ML IJ SOLN
0.2500 mg | Freq: Once | INTRAMUSCULAR | Status: AC
  Administered 2016-08-20: 0.25 mg via INTRAVENOUS
  Filled 2016-08-20: qty 2

## 2016-08-20 MED ORDER — PRAVASTATIN SODIUM 40 MG PO TABS
40.0000 mg | ORAL_TABLET | Freq: Every day | ORAL | Status: DC
  Administered 2016-08-20 – 2016-08-24 (×5): 40 mg via ORAL
  Filled 2016-08-20 (×5): qty 1

## 2016-08-20 MED ORDER — ACETAMINOPHEN 500 MG PO TABS
1000.0000 mg | ORAL_TABLET | Freq: Once | ORAL | Status: AC
  Administered 2016-08-20: 1000 mg via ORAL
  Filled 2016-08-20: qty 2

## 2016-08-20 MED ORDER — AZITHROMYCIN 500 MG IV SOLR
500.0000 mg | Freq: Once | INTRAVENOUS | Status: AC
  Administered 2016-08-20: 500 mg via INTRAVENOUS
  Filled 2016-08-20: qty 500

## 2016-08-20 MED ORDER — ALBUTEROL SULFATE (2.5 MG/3ML) 0.083% IN NEBU
2.5000 mg | INHALATION_SOLUTION | Freq: Once | RESPIRATORY_TRACT | Status: AC
  Administered 2016-08-20: 2.5 mg via RESPIRATORY_TRACT
  Filled 2016-08-20: qty 3

## 2016-08-20 MED ORDER — DIGOXIN 125 MCG PO TABS
0.1250 mg | ORAL_TABLET | Freq: Every day | ORAL | Status: DC
  Administered 2016-08-21 – 2016-08-22 (×2): 0.125 mg via ORAL
  Filled 2016-08-20 (×2): qty 1

## 2016-08-20 MED ORDER — TIMOLOL MALEATE 0.5 % OP SOLN
1.0000 [drp] | Freq: Every day | OPHTHALMIC | Status: DC
  Administered 2016-08-22 – 2016-08-25 (×5): 1 [drp] via OPHTHALMIC
  Filled 2016-08-20: qty 5

## 2016-08-20 MED ORDER — DILTIAZEM HCL-DEXTROSE 100-5 MG/100ML-% IV SOLN (PREMIX)
5.0000 mg/h | Freq: Once | INTRAVENOUS | Status: AC
  Administered 2016-08-20: 5 mg/h via INTRAVENOUS
  Administered 2016-08-20: 10 mg/h via INTRAVENOUS

## 2016-08-20 MED ORDER — VANCOMYCIN HCL IN DEXTROSE 1-5 GM/200ML-% IV SOLN
1000.0000 mg | Freq: Once | INTRAVENOUS | Status: AC
  Administered 2016-08-20: 1000 mg via INTRAVENOUS
  Filled 2016-08-20: qty 200

## 2016-08-20 MED ORDER — VANCOMYCIN HCL IN DEXTROSE 750-5 MG/150ML-% IV SOLN
750.0000 mg | INTRAVENOUS | Status: DC
  Administered 2016-08-21 – 2016-08-22 (×2): 750 mg via INTRAVENOUS
  Filled 2016-08-20 (×3): qty 150

## 2016-08-20 MED ORDER — IPRATROPIUM-ALBUTEROL 0.5-2.5 (3) MG/3ML IN SOLN
3.0000 mL | Freq: Once | RESPIRATORY_TRACT | Status: AC
  Administered 2016-08-20: 3 mL via RESPIRATORY_TRACT
  Filled 2016-08-20: qty 3

## 2016-08-20 MED ORDER — DILTIAZEM HCL-DEXTROSE 100-5 MG/100ML-% IV SOLN (PREMIX)
5.0000 mg/h | INTRAVENOUS | Status: DC
  Administered 2016-08-20: 10 mg/h via INTRAVENOUS
  Administered 2016-08-21: 15 mg/h via INTRAVENOUS
  Administered 2016-08-21: 5 mg/h via INTRAVENOUS
  Filled 2016-08-20 (×2): qty 100

## 2016-08-20 MED ORDER — DEXTROSE 5 % IV SOLN
1.0000 g | Freq: Once | INTRAVENOUS | Status: AC
  Administered 2016-08-20: 1 g via INTRAVENOUS
  Filled 2016-08-20: qty 10

## 2016-08-20 MED ORDER — DEXTROSE 5 % IV SOLN: 1.0000 g | INTRAVENOUS | Status: DC

## 2016-08-20 MED ORDER — DEXTROSE 5 % IV SOLN
1.0000 g | INTRAVENOUS | Status: DC
  Administered 2016-08-21 – 2016-08-25 (×5): 1 g via INTRAVENOUS
  Filled 2016-08-20 (×6): qty 10

## 2016-08-20 MED ORDER — PANTOPRAZOLE SODIUM 40 MG PO TBEC
40.0000 mg | DELAYED_RELEASE_TABLET | Freq: Every day | ORAL | Status: DC
  Administered 2016-08-20 – 2016-08-25 (×6): 40 mg via ORAL
  Filled 2016-08-20 (×6): qty 1

## 2016-08-20 MED ORDER — DILTIAZEM HCL-DEXTROSE 100-5 MG/100ML-% IV SOLN (PREMIX)
INTRAVENOUS | Status: AC
  Administered 2016-08-20: 10 mg/h via INTRAVENOUS
  Filled 2016-08-20: qty 100

## 2016-08-20 MED ORDER — GABAPENTIN 300 MG PO CAPS
300.0000 mg | ORAL_CAPSULE | Freq: Every day | ORAL | Status: DC
  Administered 2016-08-20 – 2016-08-24 (×5): 300 mg via ORAL
  Filled 2016-08-20 (×5): qty 1

## 2016-08-20 MED ORDER — DILTIAZEM HCL 25 MG/5ML IV SOLN
10.0000 mg | Freq: Once | INTRAVENOUS | Status: AC
  Administered 2016-08-20: 10 mg via INTRAVENOUS

## 2016-08-20 MED ORDER — METHYLPREDNISOLONE SODIUM SUCC 125 MG IJ SOLR
125.0000 mg | Freq: Once | INTRAMUSCULAR | Status: AC
  Administered 2016-08-20: 125 mg via INTRAVENOUS
  Filled 2016-08-20: qty 2

## 2016-08-20 MED ORDER — SODIUM CHLORIDE 0.9 % IV SOLN
INTRAVENOUS | Status: DC
  Administered 2016-08-20: 19:00:00 via INTRAVENOUS

## 2016-08-20 NOTE — ED Provider Notes (Signed)
[facility] Provider Note   CSN: [id number] Arrival date & time: 08/20/16  1430     History   Chief Complaint Chief Complaint  Patient presents with  . Shortness of Breath    HPI [name] is a 83 y.o. female.  Patient complains of shortness of breath. She was seen yesterday with the urinary tract infection.   The history is provided by the patient. No language interpreter was used.  Shortness of Breath  This is a new problem. The problem occurs continuously.The current episode started 3 to 5 hours ago. The problem has not changed since onset.Associated symptoms include a fever. Pertinent negatives include no headaches, no cough, no chest pain, no abdominal pain and no rash. It is unknown what precipitated the problem.    Past Medical History:  Diagnosis Date  . Hypertension   . 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 Trigeminal neuralgia     Patient Active Problem List   Diagnosis Date Noted  . URI (upper respiratory infection) 04/29/2014  . 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 Essential hypertension, benign 02/20/2013  . High cholesterol 02/20/2013    Past Surgical History:  Procedure Laterality Date  . BREAST CYST EXCISION  60 yrs ago  . COLONOSCOPY WITH ESOPHAGOGASTRODUODENOSCOPY (EGD) N/A 03/10/2013   Procedure: COLONOSCOPY WITH ESOPHAGOGASTRODUODENOSCOPY (EGD);  Surgeon: Malissa Hippo, MD;  Location: [facility];  Service: Endoscopy;  Laterality: N/A;  730  . SLT LASER APPLICATION Left 09/27/2014   Procedure: SLT LASER APPLICATION;  Surgeon: Susa Simmonds, MD;  Location: [facility] ORS;  Service: Ophthalmology;  Laterality: Left;    OB History    No data available       Home Medications    Prior to Admission medications   Medication Sig Start Date End Date Taking? Authorizing Provider  Ascorbic Acid (VITAMIN C) 1000 MG tablet Take 1,000 mg by mouth daily.    Historical Provider, MD    aspirin EC 81 MG tablet Take 81 mg by mouth daily.    Historical Provider, MD  Calcium Carbonate-Vitamin D (CALCIUM 600+D) 600-400 MG-UNIT tablet Take 1 tablet by mouth daily.    Historical Provider, MD  cephALEXin (KEFLEX) 500 MG capsule Take 1 capsule (500 mg total) by mouth 3 (three) times daily. 08/19/16   Raeford Razor, MD  Coenzyme Q10 (COQ10) 100 MG CAPS Take 100 mg by mouth daily.    Historical Provider, MD  gabapentin (NEURONTIN) 300 MG capsule Take 300 mg by mouth at bedtime.     Historical Provider, MD  Multiple Vitamin (MULTIVITAMIN WITH MINERALS) TABS tablet Take 1 tablet by mouth daily.    Historical Provider, MD  Multiple Vitamins-Minerals (MULTIVITAMIN WITH MINERALS) tablet Take 1 tablet by mouth daily.    Historical Provider, MD  omega-3 acid ethyl esters (LOVAZA) 1 g capsule Take 1 g by mouth 2 (two) times daily.    Historical Provider, MD  pantoprazole (PROTONIX) 40 MG tablet TAKE ONE TABLET BY MOUTH ONCE DAILY. 03/05/16   Malissa Hippo, MD  Plant Sterol Stanol-Pantethine 450-75 MG TABS Take 1 tablet by mouth daily.     Historical Provider, MD  pravastatin (PRAVACHOL) 40 MG tablet Take 40 mg by mouth at bedtime.     Historical Provider, MD  timolol (TIMOPTIC) 0.5 % ophthalmic solution Place 1 drop into both eyes daily.    Historical Provider, MD  triamterene-hydrochlorothiazide (MAXZIDE-25) 37.5-25 MG per tablet Take 1 tablet by mouth daily.  Historical Provider, MD  vitamin E 400 UNIT capsule Take 400 Units by mouth daily.    Historical Provider, MD    Family History History reviewed. No pertinent family history.  Social History Social History  Substance Use Topics  . Smoking status: Never Smoker  . Smokeless tobacco: Never Used  . Alcohol use No     Allergies   Sulfa antibiotics   Review of Systems Review of Systems  Constitutional: Positive for fever. Negative for appetite change and fatigue.  HENT: Negative for congestion, ear discharge and sinus  pressure.   Eyes: Negative for discharge.  Respiratory: Positive for shortness of breath. Negative for cough.   Cardiovascular: Negative for chest pain.  Gastrointestinal: Negative for abdominal pain and diarrhea.  Genitourinary: Negative for frequency and hematuria.  Musculoskeletal: Negative for back pain.  Skin: Negative for rash.  Neurological: Negative for seizures and headaches.  Psychiatric/Behavioral: Negative for hallucinations.     Physical Exam Updated Vital Signs BP (!) 87/59   Pulse (!) 142   Temp (!) 101.3 F (38.5 C) (Rectal)   Resp (!) 34   Ht  (1.6 m)   Wt 145 lb (65.8 kg)   SpO2 91%   BMI 25.69 kg/m   Physical Exam  Constitutional: She is oriented to person, place, and time. She appears well-developed.  HENT:  Head: Normocephalic.  Eyes: Conjunctivae and EOM are normal. No scleral icterus.  Neck: Neck supple. No thyromegaly present.  Cardiovascular: Normal rate and regular rhythm.  Exam reveals no gallop and no friction rub.   No murmur heard. Pulmonary/Chest: No stridor. She has wheezes. She has no rales. She exhibits no tenderness.  Abdominal: She exhibits no distension. There is no tenderness. There is no rebound.  Musculoskeletal: Normal range of motion. She exhibits no edema.  Lymphadenopathy:    She has no cervical adenopathy.  Neurological: She is oriented to person, place, and time. She exhibits normal muscle tone. Coordination normal.  Skin: No rash noted. No erythema.  Psychiatric: She has a normal mood and affect. Her behavior is normal.     ED Treatments / Results  Labs (all labs ordered are listed, but only abnormal results are displayed) Labs Reviewed  CBC WITH DIFFERENTIAL/PLATELET - Abnormal; Notable for the following:       Result Value   WBC 2.4 (*)    Platelets 79 (*)    Lymphs Abs 0.6 (*)    Monocytes Absolute 0.0 (*)    All other components within normal limits  COMPREHENSIVE METABOLIC PANEL - Abnormal; Notable for  the following:    Chloride 100 (*)    BUN 22 (*)    Creatinine, Ser 1.27 (*)    Calcium 8.7 (*)    AST 51 (*)    GFR calc non Af Amer 37 (*)    GFR calc Af Amer 43 (*)    All other components within normal limits  BRAIN NATRIURETIC PEPTIDE - Abnormal; Notable for the following:    B Natriuretic Peptide 514.0 (*)    All other components within normal limits  I-STAT CHEM 8, ED - Abnormal; Notable for the following:    Chloride 99 (*)    BUN 24 (*)    Creatinine, Ser 1.30 (*)    Calcium, Ion 1.08 (*)    Hemoglobin 15.3 (*)    All other components within normal limits  I-STAT TROPOININ, ED - Abnormal; Notable for the following:    Troponin i, poc 0.15 (*)  All other components within normal limits  CULTURE, BLOOD (ROUTINE X 2)  CULTURE, BLOOD (ROUTINE X 2)  URINALYSIS, ROUTINE W REFLEX MICROSCOPIC  PATHOLOGIST SMEAR REVIEW  LACTIC ACID, PLASMA  LACTIC ACID, PLASMA  I-STAT CG4 LACTIC ACID, ED    EKG  EKG Interpretation  Date/Time:  Monday August 20 2016 14:36:31 EDT Ventricular Rate:  111 PR Interval:    QRS Duration: 95 QT Interval:  296 QTC Calculation: 401 R Axis:   119 Text Interpretation:  Sinus tachycardia Paired ventricular premature complexes Consider right atrial enlargement Right axis deviation Low voltage, precordial leads Borderline ST depression, anterolateral leads Confirmed by Zoeann Mol  MD, Anthonette Lesage [phone]) on 08/20/2016 3:27:09 PM       Radiology Ct Abdomen Pelvis W Contrast  Result Date: 08/19/2016 CLINICAL DATA:  Right lower quadrant abdominal pain beginning 6 hours ago. EXAM: CT ABDOMEN AND PELVIS WITH CONTRAST TECHNIQUE: Multidetector CT imaging of the abdomen and pelvis was performed using the standard protocol following bolus administration of intravenous contrast. CONTRAST:  ISOVUE-300 IOPAMIDOL (ISOVUE-300) INJECTION 61% COMPARISON:  None. FINDINGS: Lower chest: Large hiatal hernia. Coronary artery calcification. No pleural or pericardial fluid.  Hepatobiliary: Liver parenchyma is normal.  No calcified gallstones. Pancreas: Normal Spleen: Normal Adrenals/Urinary Tract: Adrenal glands are normal. Left kidney is normal except for a 1.5 cm cyst. Right kidney contains a 1 cm stone in the renal pelvis which could cause ball valve obstruction. No evidence of ureteral stone or bladder stone. Stomach/Bowel: Normal appearing appendix.  No acute bowel pathology. Vascular/Lymphatic: Aortic atherosclerosis. No aneurysm. IVC is normal. No retroperitoneal adenopathy. Reproductive: Normal Other: No free fluid or air. Musculoskeletal: Curvature in chronic degenerative changes of the spine. IMPRESSION: Normal appearing appendix. No acute bowel pathology. No specific cause of right lower quadrant pain identified. 1 cm stone in the right renal pelvis. No obstruction currently, but this could result in ball valve obstruction. Aortic atherosclerosis. Large hiatal hernia. Coronary artery calcification. Electronically Signed   By: Paulina Fusi M.D.   On: 08/19/2016 20:10   Dg Chest Portable 1 View  Result Date: 08/20/2016 CLINICAL DATA:  Shortness of breath EXAM: PORTABLE CHEST 1 VIEW COMPARISON:  None FINDINGS: The heart size and mediastinal contours are within normal limits. Aortic atherosclerosis. Decreased lung volumes. Both lungs are clear. The visualized skeletal structures are unremarkable. IMPRESSION: 1. Low lung volumes. 2.  Aortic Atherosclerosis (ICD10-I70.0). Electronically Signed   By: Signa Kell M.D.   On: 08/20/2016 14:48    Procedures Procedures (including critical care time)  Medications Ordered in ED Medications  vancomycin (VANCOCIN) IVPB 1000 mg/200 mL premix (1,000 mg Intravenous New Bag/Given 08/20/16 1601)  sodium chloride 0.9 % bolus 1,000 mL (not administered)  diltiazem (CARDIZEM) injection 10 mg (not administered)  diltiazem (CARDIZEM) 100 mg in dextrose 5% (1 mg/mL) infusion (not administered)  acetaminophen (TYLENOL) tablet  1,000 mg (1,000 mg Oral Given 08/20/16 1455)  sodium chloride 0.9 % bolus 1,000 mL (0 mLs Intravenous Stopped 08/20/16 1652)  cefTRIAXone (ROCEPHIN) 1 g in dextrose 5 % 50 mL IVPB (0 g Intravenous Stopped 08/20/16 1550)  azithromycin (ZITHROMAX) 500 mg in dextrose 5 % 250 mL IVPB (0 mg Intravenous Stopped 08/20/16 1652)  methylPREDNISolone sodium succinate (SOLU-MEDROL) 125 mg/2 mL injection 125 mg (125 mg Intravenous Given 08/20/16 1508)  ipratropium-albuterol (DUONEB) 0.5-2.5 (3) MG/3ML nebulizer solution 3 mL (3 mLs Nebulization Given 08/20/16 1518)  albuterol (PROVENTIL) (2.5 MG/3ML) 0.083% nebulizer solution 2.5 mg (2.5 mg Nebulization Given 08/20/16 1518)  Initial Impression / Assessment and Plan / ED Course  I have reviewed the triage vital signs and the nursing notes.  Pertinent labs & imaging results that were available during my care of the patient were reviewed by me and considered in my medical decision making (see chart for details).    CRITICAL CARE Performed by: Rylyn Zawistowski L Total critical care time: 40 minutes Critical care time was exclusive of separately billable procedures and treating other patients. Critical care was necessary to treat or prevent imminent or life-threatening deterioration. Critical care was time spent personally by me on the following activities: development of treatment plan with patient and/or surrogate as well as nursing, discussions with consultants, evaluation of patient's response to treatment, examination of patient, obtaining history from patient or surrogate, ordering and performing treatments and interventions, ordering and review of laboratory studies, ordering and review of radiographic studies, pulse oximetry and re-evaluation of patient's condition.   Patient had fever shortness of breath. Suspect pneumonia. Patient also has elevated troponin. Cardiology was consult on the phone and they will follow the patient. EKG shows no acute changes.  Prior to being admitted to the hospital patient went into rapid A. fib she is now going to be admitted by medicine to stepdown  Final Clinical Impressions(s) / ED Diagnoses   Final diagnoses:  Community acquired pneumonia, unspecified laterality  Atrial fibrillation with [facility])    New Prescriptions New Prescriptions   No medications on file     Bethann Berkshire, MD 08/20/16 1659

## 2016-08-20 NOTE — ED Notes (Signed)
Pharmacy aware needing cardizem

## 2016-08-20 NOTE — Progress Notes (Signed)
ANTICOAGULATION CONSULT NOTE - Initial Consult  Pharmacy Consult for LOVENOX Indication: atrial fibrillation  Allergies  Allergen Reactions  . Sulfa Antibiotics Other (See Comments)    Pt states that this med makes her feel crazy.     Patient Measurements: Height:  (160 cm) Weight: 145 lb (65.8 kg) IBW/kg (Calculated) : 52.4  Vital Signs: Temp: 98 F (36.7 C) (04/30 1813) Temp Source: Oral (04/30 1813) BP: 92/55 (04/30 1730) Pulse Rate: 57 (04/30 1730)  Labs:  Recent Labs  08/19/16 1842 08/20/16 1437 08/20/16 1443  HGB 16.9* 14.9 15.3*  HCT 50.9* 45.8 45.0  PLT 101* 79*  --   CREATININE 1.17* 1.27* 1.30*    Estimated Creatinine Clearance: 28.9 mL/min (A) (by C-G formula based on SCr of 1.3 mg/dL (H)).   Medical History: Past Medical History:  Diagnosis Date  . Hypertension   . 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 Trigeminal neuralgia     Medications:  Prescriptions Prior to Admission  Medication Sig Dispense Refill Last Dose  . Ascorbic Acid (VITAMIN C) 1000 MG tablet Take 1,000 mg by mouth daily.   08/19/2016 at Unknown time  . aspirin EC 81 MG tablet Take 81 mg by mouth daily.   08/19/2016 at 0800  . Calcium Carbonate-Vitamin D (CALCIUM 600+D) 600-400 MG-UNIT tablet Take 1 tablet by mouth daily.   08/19/2016 at Unknown time  . cephALEXin (KEFLEX) 500 MG capsule Take 1 capsule (500 mg total) by mouth 3 (three) times daily. 21 capsule 0   . Coenzyme Q10 (COQ10) 100 MG CAPS Take 100 mg by mouth daily.   08/19/2016 at Unknown time  . gabapentin (NEURONTIN) 300 MG capsule Take 300 mg by mouth at bedtime.    08/18/2016 at Unknown time  . Multiple Vitamin (MULTIVITAMIN WITH MINERALS) TABS tablet Take 1 tablet by mouth daily.   08/19/2016 at Unknown time  . Multiple Vitamins-Minerals (MULTIVITAMIN WITH MINERALS) tablet Take 1 tablet by mouth daily.   08/19/2016 at Unknown time  . omega-3 acid ethyl esters  (LOVAZA) 1 g capsule Take 1 g by mouth 2 (two) times daily.   08/19/2016 at Unknown time  . pantoprazole (PROTONIX) 40 MG tablet TAKE ONE TABLET BY MOUTH ONCE DAILY. 30 tablet 5 08/19/2016 at Unknown time  . Plant Sterol Stanol-Pantethine 450-75 MG TABS Take 1 tablet by mouth daily.    08/19/2016 at Unknown time  . pravastatin (PRAVACHOL) 40 MG tablet Take 40 mg by mouth at bedtime.    08/18/2016 at Unknown time  . timolol (TIMOPTIC) 0.5 % ophthalmic solution Place 1 drop into both eyes daily.   08/19/2016 at Unknown time  . triamterene-hydrochlorothiazide (MAXZIDE-25) 37.5-25 MG per tablet Take 1 tablet by mouth daily.   08/19/2016 at Unknown time  . vitamin E 400 UNIT capsule Take 400 Units by mouth daily.   08/19/2016 at Unknown time   Assessment: 83yo female with ClCr < 92ml/hr.  Platelets 101 > 79.  SCR 1.30.  Asked to initiate Lovenox for afib.  No bleeding reported.  Monitor CBC, platelets daily.  May need to consider alternative Rx.   Goal of Therapy:  Anticoagulation for afib Monitor platelets by anticoagulation protocol: Yes   Plan:  Lovenox /Kg SQ q24hrs (renally adjusted) Monitor labs, CBC, s/sx bleeding complications. f/U CBC, platelets in am  Valrie Hart A 08/20/2016,6:56 PM

## 2016-08-20 NOTE — Progress Notes (Signed)
Critical trop of 0.38 and lactic acid of 2.7 called by lab. Values reported to MD via text page

## 2016-08-20 NOTE — H&P (Signed)
TRH H&P   Patient Demographics:    [name], is a 83 y.o. female  MRN: [id number]   DOB - 1928/10/26  Admit Date - 08/20/2016  Outpatient Primary MD for the patient is [name], MD  Referring MD/NP/PA:   Dr. Ruthy Dick  Outpatient Specialists: Dr. Karilyn Cota  Patient coming from: home  Chief Complaint  Patient presents with  . Shortness of Breath      HPI:    [name]  is a 83 y.o. female, w hypertension,  Apparently c/o dyspnea this am and started to get worse.  Denies fever, chills, cough, cp, palp, n/v,  brbpr, black stool.   Pt presented to ED, for dyspnea.    In ED, pt was found to be in Afib, w RVR, at 170  , trop 0.38.  Cardiology is aware and will consult in am per ED.  CTA chest negative for PE>  Pt will be admitted for Afib with RVR and hypotension.     Review of systems:    In addition to the HPI above,  No Fever-chills, No Headache, No changes with Vision or hearing, No problems swallowing food or Liquids,  No Abdominal pain, No Nausea or Vommitting, Bowel movements are regular, No Blood in stool or Urine, No dysuria, No new skin rashes or bruises, No new joints pains-aches,  No new weakness, tingling, numbness in any extremity, No recent weight gain or loss, No polyuria, polydypsia or polyphagia, No significant Mental Stressors.  A full 10 point Review of Systems was done, except as stated above, all other Review of Systems were negative.   With Past History of the following :    Past Medical History:  Diagnosis Date  . Hypertension   . 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 Trigeminal neuralgia       Past Surgical History:  Procedure Laterality Date  . BREAST CYST EXCISION  60 yrs ago  . COLONOSCOPY WITH ESOPHAGOGASTRODUODENOSCOPY (EGD) N/A 03/10/2013   Procedure: COLONOSCOPY  WITH ESOPHAGOGASTRODUODENOSCOPY (EGD);  Surgeon: Malissa Hippo, MD;  Location: [facility];  Service: Endoscopy;  Laterality: N/A;  730  . SLT LASER APPLICATION Left 09/27/2014   Procedure: SLT LASER APPLICATION;  Surgeon: Susa Simmonds, MD;  Location: [facility];  Service: Ophthalmology;  Laterality: Left;      Social History:     Social History  Substance Use Topics  . Smoking status: Never Smoker  . Smokeless tobacco: Never Used  . Alcohol use No     Lives - at home  Mobility -   Walks at home.    Family History :    Mother had CHF. Father had MI   Home Medications:   Prior to Admission medications   Medication Sig Start Date End Date Taking? Authorizing Provider  Ascorbic Acid (VITAMIN C) 1000 MG  tablet Take 1,000 mg by mouth daily.    Historical Provider, MD  aspirin EC 81 MG tablet Take 81 mg by mouth daily.    Historical Provider, MD  Calcium Carbonate-Vitamin D (CALCIUM 600+D) 600-400 MG-UNIT tablet Take 1 tablet by mouth daily.    Historical Provider, MD  cephALEXin (KEFLEX) 500 MG capsule Take 1 capsule (500 mg total) by mouth 3 (three) times daily. 08/19/16   Raeford Razor, MD  Coenzyme Q10 (COQ10) 100 MG CAPS Take 100 mg by mouth daily.    Historical Provider, MD  gabapentin (NEURONTIN) 300 MG capsule Take 300 mg by mouth at bedtime.     Historical Provider, MD  Multiple Vitamin (MULTIVITAMIN WITH MINERALS) TABS tablet Take 1 tablet by mouth daily.    Historical Provider, MD  Multiple Vitamins-Minerals (MULTIVITAMIN WITH MINERALS) tablet Take 1 tablet by mouth daily.    Historical Provider, MD  omega-3 acid ethyl esters (LOVAZA) 1 g capsule Take 1 g by mouth 2 (two) times daily.    Historical Provider, MD  pantoprazole (PROTONIX) 40 MG tablet TAKE ONE TABLET BY MOUTH ONCE DAILY. 03/05/16   Malissa Hippo, MD  Plant Sterol Stanol-Pantethine 450-75 MG TABS Take 1 tablet by mouth daily.     Historical Provider, MD  pravastatin (PRAVACHOL) 40 MG tablet Take 40 mg by  mouth at bedtime.     Historical Provider, MD  timolol (TIMOPTIC) 0.5 % ophthalmic solution Place 1 drop into both eyes daily.    Historical Provider, MD  triamterene-hydrochlorothiazide (MAXZIDE-25) 37.5-25 MG per tablet Take 1 tablet by mouth daily.    Historical Provider, MD  vitamin E 400 UNIT capsule Take 400 Units by mouth daily.    Historical Provider, MD     Allergies:     Allergies  Allergen Reactions  . Sulfa Antibiotics Other (See Comments)    Pt states that this med makes her feel crazy.       Physical Exam:   Vitals  Blood pressure (!) 92/55, pulse (!) 57, temperature 98 F (36.7 C), temperature source Oral, resp. rate 19, height  (1.6 m), weight 65.8 kg (145 lb), SpO2 91 %.   1. General lying in bed in NAD,    2. Normal affect and insight, Not Suicidal or Homicidal, Awake Alert, Oriented X 3.  3. No F.N deficits, ALL C.Nerves Intact, Strength 5/5 all 4 extremities, Sensation intact all 4 extremities, Plantars down going.  4. Ears and Eyes appear Normal, Conjunctivae clear, PERRLA. Moist Oral Mucosa.  5. Supple Neck, No JVD, No cervical lymphadenopathy appriciated, No Carotid Bruits.  6. Symmetrical Chest wall movement, Good air movement bilaterally, CTAB.  7. Irr, Irr s1, s2   8. Positive Bowel Sounds, Abdomen Soft, No tenderness, No organomegaly appriciated,No rebound -guarding or rigidity.  9.  No Cyanosis, Normal Skin Turgor, No Skin Rash or Bruise.  10. Good muscle tone,  joints appear normal , no effusions, Normal ROM.  11. No Palpable Lymph Nodes in Neck or Axillae    Data Review:    CBC  Recent Labs Lab 08/19/16 1842 08/20/16 1437 08/20/16 1443  WBC 3.4* 2.4*  --   HGB 16.9* 14.9 15.3*  HCT 50.9* 45.8 45.0  PLT 101* 79*  --   MCV 92.2 93.1  --   MCH 30.6 30.3  --   MCHC 33.2 32.5  --   RDW 12.8 13.4  --   LYMPHSABS 0.2* 0.6*  --   MONOABS 0.0* 0.0*  --  EOSABS 0.0 0.0  --   BASOSABS 0.0 0.0  --     ------------------------------------------------------------------------------------------------------------------  Chemistries   Recent Labs Lab 08/19/16 1842 08/20/16 1437 08/20/16 1443  NA 141 140 140  K 3.4* 3.8 3.7  CL 99* 100* 99*  CO2 29 26  --   GLUCOSE 101* 98 96  BUN 22* 22* 24*  CREATININE 1.17* 1.27* 1.30*  CALCIUM 9.9 8.7*  --   AST 67* 51*  --   ALT 38 40  --   ALKPHOS 79 121  --   BILITOT 1.6* 1.0  --    ------------------------------------------------------------------------------------------------------------------ estimated creatinine clearance is 28.9 mL/min (A) (by C-G formula based on SCr of 1.3 mg/dL (H)). ------------------------------------------------------------------------------------------------------------------ No results for input(s): TSH, T4TOTAL, T3FREE, THYROIDAB in the last 72 hours.  Invalid input(s): FREET3  Coagulation profile No results for input(s): INR, PROTIME in the last 168 hours. ------------------------------------------------------------------------------------------------------------------- No results for input(s): DDIMER in the last 72 hours. -------------------------------------------------------------------------------------------------------------------  Cardiac Enzymes No results for input(s): CKMB, TROPONINI, MYOGLOBIN in the last 168 hours.  Invalid input(s): CK ------------------------------------------------------------------------------------------------------------------    Component Value Date/Time   BNP 514.0 (H) 08/20/2016 1437     ---------------------------------------------------------------------------------------------------------------  Urinalysis    Component Value Date/Time   COLORURINE YELLOW 08/19/2016 1805   APPEARANCEUR HAZY (A) 08/19/2016 1805   LABSPEC 1.006 08/19/2016 1805   PHURINE 6.0 08/19/2016 1805   GLUCOSEU NEGATIVE 08/19/2016 1805   HGBUR MODERATE (A) 08/19/2016 1805    BILIRUBINUR NEGATIVE 08/19/2016 1805   KETONESUR NEGATIVE 08/19/2016 1805   PROTEINUR NEGATIVE 08/19/2016 1805   NITRITE POSITIVE (A) 08/19/2016 1805   LEUKOCYTESUR LARGE (A) 08/19/2016 1805    ----------------------------------------------------------------------------------------------------------------   Imaging Results:    Ct Abdomen Pelvis W Contrast  Result Date: 08/19/2016 CLINICAL DATA:  Right lower quadrant abdominal pain beginning 6 hours ago. EXAM: CT ABDOMEN AND PELVIS WITH CONTRAST TECHNIQUE: Multidetector CT imaging of the abdomen and pelvis was performed using the standard protocol following bolus administration of intravenous contrast. CONTRAST:  ISOVUE-300 IOPAMIDOL (ISOVUE-300) INJECTION 61% COMPARISON:  None. FINDINGS: Lower chest: Large hiatal hernia. Coronary artery calcification. No pleural or pericardial fluid. Hepatobiliary: Liver parenchyma is normal.  No calcified gallstones. Pancreas: Normal Spleen: Normal Adrenals/Urinary Tract: Adrenal glands are normal. Left kidney is normal except for a 1.5 cm cyst. Right kidney contains a 1 cm stone in the renal pelvis which could cause ball valve obstruction. No evidence of ureteral stone or bladder stone. Stomach/Bowel: Normal appearing appendix.  No acute bowel pathology. Vascular/Lymphatic: Aortic atherosclerosis. No aneurysm. IVC is normal. No retroperitoneal adenopathy. Reproductive: Normal Other: No free fluid or air. Musculoskeletal: Curvature in chronic degenerative changes of the spine. IMPRESSION: Normal appearing appendix. No acute bowel pathology. No specific cause of right lower quadrant pain identified. 1 cm stone in the right renal pelvis. No obstruction currently, but this could result in ball valve obstruction. Aortic atherosclerosis. Large hiatal hernia. Coronary artery calcification. Electronically Signed   By: Paulina Fusi M.D.   On: 08/19/2016 20:10   Dg Chest Portable 1 View  Result Date:  08/20/2016 CLINICAL DATA:  Shortness of breath EXAM: PORTABLE CHEST 1 VIEW COMPARISON:  None FINDINGS: The heart size and mediastinal contours are within normal limits. Aortic atherosclerosis. Decreased lung volumes. Both lungs are clear. The visualized skeletal structures are unremarkable. IMPRESSION: 1. Low lung volumes. 2.  Aortic Atherosclerosis (ICD10-I70.0). Electronically Signed   By: Signa Kell M.D.   On: 08/20/2016 14:48       Assessment &  Plan:    Active Problems:   Atrial fibrillation with rapid ventricular response (HCC)    1. Afib with RVR,  Trop I q6h x3  Tsh Cardiac echo lovenox /kg [place] bid Cardizem gtt  2.   ?Uti Blood culture x2, urine culture pending Rocephin 1gm iv qday, vanco iv pharmacy to dose  3. Mild ARF Hydrate with ns iv  cmp in am  4. Abnormal liver function Check RUQ ultrasound in am Acute hepatitis panel .   DVT Prophylaxis  Lovenox - SCDs   AM Labs Ordered, also please review Full Orders  Family Communication: Admission, patients condition and plan of care including tests being ordered have been discussed with the patient  who indicate understanding and agree with the plan and Code Status.  Code Status  FULL CODE  Likely DC to  home  Condition GUARDED    Consults called: cardiology by ED  Admission status: inpatient  Time spent in minutes : 45 minutes critical care   Pearson Grippe M.D on 08/20/2016 at 6:30 PM  Between 7am to 7pm - Pager - [phone]. After 7pm go to [email or web address] - password [facility]  [facility] - Office  [phone]

## 2016-08-20 NOTE — Progress Notes (Signed)
Pharmacy Antibiotic Note  [name] is a 83 y.o. female admitted on 08/20/2016 with pneumonia.  Pharmacy has been consulted for Vancomycin dosing.  Plan: Vancomycin  x 1 then  IV q24hrs Monitor labs, progress, c/s  Height:  (160 cm) Weight: 145 lb (65.8 kg) IBW/kg (Calculated) : 52.4  Temp (24hrs), Avg:100.6 F (38.1 C), Min:98 F (36.7 C), Max:102.7 F (39.3 C)   Recent Labs Lab 08/19/16 1842 08/19/16 2104 08/20/16 1443  WBC 3.4*  --   --   CREATININE 1.17*  --  1.30*  LATICACIDVEN 4.7* 2.3*  --     Estimated Creatinine Clearance: 28.9 mL/min (A) (by C-G formula based on SCr of 1.3 mg/dL (H)).    Allergies  Allergen Reactions  . Sulfa Antibiotics Other (See Comments)    Pt states that this med makes her feel crazy.     Antimicrobials this admission: Vancomycin 4/30 >>  Rocephin 4/30 >>  Zithromax 4/30 >> Dose adjustments this admission:  Microbiology results:  BCx: pending  UCx: pending   Sputum:    MRSA PCR:   Thank you for allowing pharmacy to be a part of this patient's care.  Valrie Hart A 08/20/2016 3:12 PM

## 2016-08-20 NOTE — ED Notes (Signed)
RT starting tx at this time. Blood cx in progress. No changes in respiratory status. Son at bedside and updated.

## 2016-08-20 NOTE — ED Triage Notes (Signed)
Per ems, pt called for shortness of breath.  Denies hx of same.  Placed on CPAP prior to arrival.  Pt denies any further complaints.

## 2016-08-20 NOTE — ED Notes (Signed)
Date and time results received: 08/20/16 5:42 PM  (use smartphrase ".now" to insert current time)  Test: Lactic Critical Value: 3.1  Name of Provider Notified: Hospitalist  Orders Received? Or Actions Taken?: Actions Taken: Paged hospitalist

## 2016-08-21 ENCOUNTER — Inpatient Hospital Stay (HOSPITAL_COMMUNITY): Payer: PPO

## 2016-08-21 DIAGNOSIS — R0602 Shortness of breath: Secondary | ICD-10-CM

## 2016-08-21 DIAGNOSIS — I248 Other forms of acute ischemic heart disease: Secondary | ICD-10-CM

## 2016-08-21 DIAGNOSIS — I4891 Unspecified atrial fibrillation: Secondary | ICD-10-CM

## 2016-08-21 DIAGNOSIS — J810 Acute pulmonary edema: Secondary | ICD-10-CM

## 2016-08-21 DIAGNOSIS — I712 Thoracic aortic aneurysm, without rupture: Secondary | ICD-10-CM

## 2016-08-21 DIAGNOSIS — D696 Thrombocytopenia, unspecified: Secondary | ICD-10-CM

## 2016-08-21 DIAGNOSIS — I700 Atherosclerosis of aorta: Secondary | ICD-10-CM

## 2016-08-21 DIAGNOSIS — R0603 Acute respiratory distress: Secondary | ICD-10-CM

## 2016-08-21 DIAGNOSIS — A419 Sepsis, unspecified organism: Secondary | ICD-10-CM

## 2016-08-21 DIAGNOSIS — Z7189 Other specified counseling: Secondary | ICD-10-CM

## 2016-08-21 HISTORY — DX: Sepsis, unspecified organism: A41.9

## 2016-08-21 LAB — CBC
HCT: 40.9 % (ref 36.0–46.0)
HEMOGLOBIN: 13.3 g/dL (ref 12.0–15.0)
MCH: 30.4 pg (ref 26.0–34.0)
MCHC: 32.5 g/dL (ref 30.0–36.0)
MCV: 93.4 fL (ref 78.0–100.0)
PLATELETS: 67 10*3/uL — AB (ref 150–400)
RBC: 4.38 MIL/uL (ref 3.87–5.11)
RDW: 13.5 % (ref 11.5–15.5)
WBC: 18.4 10*3/uL — AB (ref 4.0–10.5)

## 2016-08-21 LAB — COMPREHENSIVE METABOLIC PANEL
ALK PHOS: 51 U/L (ref 38–126)
ALT: 37 U/L (ref 14–54)
AST: 39 U/L (ref 15–41)
Albumin: 3.2 g/dL — ABNORMAL LOW (ref 3.5–5.0)
Anion gap: 9 (ref 5–15)
BUN: 27 mg/dL — AB (ref 6–20)
CALCIUM: 7.6 mg/dL — AB (ref 8.9–10.3)
CHLORIDE: 103 mmol/L (ref 101–111)
CO2: 23 mmol/L (ref 22–32)
CREATININE: 1.12 mg/dL — AB (ref 0.44–1.00)
GFR calc Af Amer: 50 mL/min — ABNORMAL LOW (ref >60)
GFR calc non Af Amer: 44 mL/min — ABNORMAL LOW (ref >60)
Glucose, Bld: 205 mg/dL — ABNORMAL HIGH (ref 65–99)
Potassium: 4 mmol/L (ref 3.5–5.1)
SODIUM: 135 mmol/L (ref 135–145)
Total Bilirubin: 0.7 mg/dL (ref 0.3–1.2)
Total Protein: 6.2 g/dL — ABNORMAL LOW (ref 6.5–8.1)

## 2016-08-21 LAB — TROPONIN I
Troponin I: 0.27 ng/mL (ref <0.03)
Troponin I: 0.18 ng/mL (ref <0.03)

## 2016-08-21 LAB — ECHOCARDIOGRAM COMPLETE
CHL CUP MV DEC (S): 165
CHL CUP RV SYS PRESS: 47 mmHg
CHL CUP TV REG PEAK VELOCITY: 314 cm/s
E decel time: 165 msec
E/e' ratio: 10.7
FS: 40 % (ref 28–44)
Height: 63 in
IV/PV OW: 1.03
LA ID, A-P, ES: 33 mm
LA diam end sys: 33 mm
LA vol A4C: 59 ml
LA vol index: 34.5 mL/m2
LADIAMINDEX: 1.83 cm/m2
LAVOL: 62.3 mL
LDCA: 2.01 cm2
LV PW d: 9.66 mm — AB (ref 0.6–1.1)
LV TDI E'LATERAL: 9.25
LV e' LATERAL: 9.25 cm/s
LV sys vol: 22 mL (ref 14–42)
LVDIAVOL: 53 mL (ref 46–106)
LVDIAVOLIN: 29 mL/m2
LVEEAVG: 10.7
LVEEMED: 10.7
LVOT VTI: 21.9 cm
LVOT peak grad rest: 5 mmHg
LVOT peak vel: 110 cm/s
LVOTD: 16 mm
LVOTSV: 44 mL
LVSYSVOLIN: 12 mL/m2
Lateral S' vel: 18 cm/s
MV pk E vel: 99 m/s
MVPG: 4 mmHg
MVPKAVEL: 104 m/s
P 1/2 time: 365 ms
RV TAPSE: 20.7 mm
Simpson's disk: 59
Stroke v: 31 ml
TDI e' medial: 8.59
TR max vel: 314 cm/s
Weight: 2532.64 oz

## 2016-08-21 LAB — URINE CULTURE

## 2016-08-21 LAB — LACTIC ACID, PLASMA
Lactic Acid, Venous: 2.1 mmol/L (ref 0.5–1.9)
Lactic Acid, Venous: 3.7 mmol/L (ref 0.5–1.9)

## 2016-08-21 LAB — PATHOLOGIST SMEAR REVIEW

## 2016-08-21 MED ORDER — FUROSEMIDE 10 MG/ML IJ SOLN
20.0000 mg | Freq: Once | INTRAMUSCULAR | Status: AC
Start: 2016-08-21 — End: 2016-08-21
  Administered 2016-08-21: 20 mg via INTRAVENOUS
  Filled 2016-08-21: qty 2

## 2016-08-21 MED ORDER — VANCOMYCIN HCL IN DEXTROSE 750-5 MG/150ML-% IV SOLN
INTRAVENOUS | Status: AC
  Filled 2016-08-21: qty 150

## 2016-08-21 MED ORDER — LEVALBUTEROL HCL 0.63 MG/3ML IN NEBU
0.6300 mg | INHALATION_SOLUTION | Freq: Four times a day (QID) | RESPIRATORY_TRACT | Status: DC | PRN
  Administered 2016-08-21: 0.63 mg via RESPIRATORY_TRACT
  Filled 2016-08-21: qty 3

## 2016-08-21 MED ORDER — FUROSEMIDE 10 MG/ML IJ SOLN
40.0000 mg | Freq: Once | INTRAMUSCULAR | Status: AC
Start: 2016-08-21 — End: 2016-08-21
  Administered 2016-08-21: 40 mg via INTRAVENOUS
  Filled 2016-08-21: qty 4

## 2016-08-21 MED ORDER — LORAZEPAM 1 MG PO TABS
1.0000 mg | ORAL_TABLET | Freq: Once | ORAL | Status: AC
  Administered 2016-08-21: 1 mg via ORAL
  Filled 2016-08-21: qty 1

## 2016-08-21 NOTE — Progress Notes (Signed)
Lactic Acid 3.7 reported to MD.

## 2016-08-21 NOTE — Progress Notes (Signed)
Critical Lactic Acid 2.1; MD notified via text page

## 2016-08-21 NOTE — Progress Notes (Signed)
Renal ultrasound demonstrates no hydronephrosis. Patient appears to be improving.  She needs close follow-up for her stone as this may recur.  I have requested 2 week f/u for Urology at [facility], and if there are no appointments within that time frame then I will have her scheduled in [place].

## 2016-08-21 NOTE — Plan of Care (Signed)
Problem: Safety: Goal: Ability to remain free from injury will improve Outcome: Progressing Patient bed alarm set, patient knows to use call bell for assistance.

## 2016-08-21 NOTE — Consult Note (Addendum)
CARDIOLOGY CONSULT NOTE  Patient ID: [name] MRN: [id number] DOB/AGE: 1928-08-11 83 y.o.  Admit date: 08/20/2016 Primary Physician: [name], MD Referring Physician: [place]  Reason for Consultation: anticoagulation for atrial fibrillation  HPI: The patient is an 83 yr old woman whom I was asked by Dr. Jabier Gauss to see for anticoagulation recommendations in the setting of atrial fibrillation.  She has hypertension and was recently treated for a UTI with IV fluids and antibiotics. She presented with shortness of breath to the ED yesterday and eventually developed rapid atrial fibrillation. She was also septic from UTI with elevated lactate.  I spoke with the ED physician yesterday afternoon regarding her presentation and reviewed labs and ECG's at that time.  Chest xray showed aortic atherosclerosis.  CT angio chest: No pulmonary embolism, moderate bibasilar atelectasis, possible right heart failure signs, ascending aortic aneurysm 4.6 cm, hiatal hernia, and CAD.  Chest xray today showed worsening pulmonary vascular congestion.  She had abnormal LFTs but RUQ [place] was normal.  Troponins peaked at 0.38 and trended down to 0.18.  Serum lactate was elevated. D-dimer > 20.  BNP 514.  She was started on a diltiazem drip and subsequently converted to normal sinus rhythm.  She was started on Lovenox but platelets dropped from 79k to 67k and Lovenox was discontinued.  She currently denies chest pain and palpitations and denies chest pain. Primary complaint is shortness of breath.  It appears she received 20 mg IV Lasix earlier this morning.  I personally reviewed an ECG performed yesterday which showed rapid atrial fibrillation, 174 bpm.      Allergies  Allergen Reactions  . Sulfa Antibiotics Other (See Comments)    Pt states that this med makes her feel crazy.      Current Facility-Administered Medications  Medication Dose Route Frequency Provider Last Rate  Last Dose  . cefTRIAXone (ROCEPHIN) 1 g in dextrose 5 % 50 mL IVPB  1 g Intravenous Q24H Pearson Grippe, MD 100 mL/hr at 08/21/16 1508 1 g at 08/21/16 1508  . digoxin (LANOXIN) tablet 0.125 mg  0.125 mg Oral Daily Pearson Grippe, MD   0.125 mg at 08/21/16 0848  . diltiazem (CARDIZEM) 100 mg in dextrose 5% (1 mg/mL) infusion  5-15 mg/hr Intravenous Titrated Pearson Grippe, MD   Stopped at 08/21/16 0920  . furosemide (LASIX) injection 40 mg  40 mg Intravenous Once Laqueta Linden, MD      . gabapentin (NEURONTIN) capsule 300 mg  300 mg Oral QHS Pearson Grippe, MD   300 mg at 08/20/16 2242  . levalbuterol (XOPENEX) nebulizer solution 0.63 mg  0.63 mg Nebulization Q6H PRN Starleen Arms, MD   0.63 mg at 08/21/16 0851  . pantoprazole (PROTONIX) EC tablet 40 mg  40 mg Oral Daily Pearson Grippe, MD   40 mg at 08/21/16 0849  . pravastatin (PRAVACHOL) tablet 40 mg  40 mg Oral QHS Pearson Grippe, MD   40 mg at 08/20/16 2240  . timolol (TIMOPTIC) 0.5 % ophthalmic solution 1 drop  1 drop Both Eyes Daily Pearson Grippe, MD      . vancomycin (VANCOCIN) IVPB 750 mg/150 ml premix  750 mg Intravenous Q24H Bethann Berkshire, MD   Stopped at 08/21/16 5621    Past Medical History:  Diagnosis Date  . Hypertension   . 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 Trigeminal neuralgia     Past Surgical History:  Procedure Laterality Date  . BREAST CYST EXCISION  60 yrs ago  . COLONOSCOPY WITH ESOPHAGOGASTRODUODENOSCOPY (EGD) N/A 03/10/2013   Procedure: COLONOSCOPY WITH ESOPHAGOGASTRODUODENOSCOPY (EGD);  Surgeon: Malissa Hippo, MD;  Location: [facility];  Service: Endoscopy;  Laterality: N/A;  730  . SLT LASER APPLICATION Left 09/27/2014   Procedure: SLT LASER APPLICATION;  Surgeon: Susa Simmonds, MD;  Location: [facility];  Service: Ophthalmology;  Laterality: Left;    Social History   Social History  . Marital status: Widowed    Spouse name: N/A  . Number of children: N/A  .  Years of education: N/A   Occupational History  . Not on file.   Social History Main Topics  . Smoking status: Never Smoker  . Smokeless tobacco: Never Used  . Alcohol use No  . Drug use: No  . Sexual activity: No   Other Topics Concern  . Not on file   Social History Narrative  . No narrative on file     No family history of premature CAD in 1st degree relatives.  Prior to Admission medications   Medication Sig Start Date End Date Taking? Authorizing Provider  Ascorbic Acid (VITAMIN C) 1000 MG tablet Take 1,000 mg by mouth daily.   Yes Historical Provider, MD  aspirin EC 81 MG tablet Take 81 mg by mouth daily.   Yes Historical Provider, MD  Calcium Carbonate-Vitamin D (CALCIUM 600+D) 600-400 MG-UNIT tablet Take 1 tablet by mouth daily.   Yes Historical Provider, MD  Coenzyme Q10 (COQ10) 100 MG CAPS Take 100 mg by mouth daily.   Yes Historical Provider, MD  gabapentin (NEURONTIN) 300 MG capsule Take 300 mg by mouth at bedtime.    Yes Historical Provider, MD  Multiple Vitamin (MULTIVITAMIN WITH MINERALS) TABS tablet Take 1 tablet by mouth daily.   Yes Historical Provider, MD  omega-3 acid ethyl esters (LOVAZA) 1 g capsule Take 1 g by mouth 2 (two) times daily.   Yes Historical Provider, MD  pantoprazole (PROTONIX) 40 MG tablet TAKE ONE TABLET BY MOUTH ONCE DAILY. 03/05/16  Yes Malissa Hippo, MD  Plant Sterol Stanol-Pantethine 450-75 MG TABS Take 1 tablet by mouth daily.    Yes Historical Provider, MD  pravastatin (PRAVACHOL) 40 MG tablet Take 40 mg by mouth at bedtime.    Yes Historical Provider, MD  timolol (TIMOPTIC) 0.5 % ophthalmic solution Place 1 drop into both eyes daily.   Yes Historical Provider, MD  triamterene-hydrochlorothiazide (MAXZIDE-25) 37.5-25 MG per tablet Take 1 tablet by mouth daily.   Yes Historical Provider, MD  vitamin E 400 UNIT capsule Take 400 Units by mouth daily.   Yes Historical Provider, MD  cephALEXin (KEFLEX) 500 MG capsule Take 1 capsule (500 mg  total) by mouth 3 (three) times daily. 08/19/16   Raeford Razor, MD  Multiple Vitamins-Minerals (MULTIVITAMIN WITH MINERALS) tablet Take 1 tablet by mouth daily.    Historical Provider, MD     Review of systems complete and found to be negative unless listed above in HPI     Physical exam Blood pressure 113/81, pulse 80, temperature 98.6 F (37 C), temperature source Oral, resp. rate (!) 21, height  (1.6 m), weight 158 lb 4.6 oz (71.8 kg), SpO2 94 %. General: NAD Neck: No JVD, no thyromegaly or thyroid nodule.  Lungs: Bilateral crackles 1/3 up. CV: Nondisplaced PMI. Regular rate and rhythm, normal S1/S2, no S3/S4, no murmur.  No peripheral edema.  Abdomen: Soft, nontender, no hepatosplenomegaly, no  distention.  Skin: Intact without lesions or rashes.  Neurologic: Alert and oriented x 3.  Psych: Normal affect. Extremities: No clubbing or cyanosis.  HEENT: Normal.   ECG: Most recent ECG reviewed.  Telemetry: Independently reviewed.  Labs:   Lab Results  Component Value Date   WBC 18.4 (H) 08/21/2016   HGB 13.3 08/21/2016   HCT 40.9 08/21/2016   MCV 93.4 08/21/2016   PLT 67 (L) 08/21/2016    Recent Labs Lab 08/21/16 0652  NA 135  K 4.0  CL 103  CO2 23  BUN 27*  CREATININE 1.12*  CALCIUM 7.6*  PROT 6.2*  BILITOT 0.7  ALKPHOS 51  ALT 37  AST 39  GLUCOSE 205*   Lab Results  Component Value Date   TROPONINI 0.18 (HH) 08/21/2016   No results found for: CHOL No results found for: HDL No results found for: LDLCALC No results found for: TRIG No results found for: CHOLHDL No results found for: LDLDIRECT       Studies: Ct Angio Chest Pe W Or Wo Contrast  Result Date: 08/20/2016 CLINICAL DATA:  Worsening dyspnea for 2 days.  Inpatient. EXAM: CT ANGIOGRAPHY CHEST WITH CONTRAST TECHNIQUE: Multidetector CT imaging of the chest was performed using the standard protocol during bolus administration of intravenous contrast. Multiplanar CT image reconstructions  and MIPs were obtained to evaluate the vascular anatomy. CONTRAST:  75 cc Isovue 370 IV. COMPARISON:  Chest radiograph from earlier today. FINDINGS: Motion degraded scan. Cardiovascular: The study is moderate quality for the evaluation of pulmonary embolism, with evaluation of the segmental and subsegmental pulmonary arteries limited by motion artifact. There are no filling defects in the central, lobar, segmental or subsegmental pulmonary artery branches to suggest acute pulmonary embolism. Atherosclerotic thoracic aorta. Aneurysmal 4.6 cm ascending thoracic aorta. Normal caliber pulmonary arteries. Normal heart size. No significant pericardial fluid/thickening. Left anterior descending and left circumflex coronary atherosclerosis. Mediastinum/Nodes: No discrete thyroid nodules. Unremarkable esophagus. No pathologically enlarged axillary, mediastinal or hilar lymph nodes. Lungs/Pleura: No pneumothorax. No pleural effusion. Moderate atelectasis in the medial/ dependent bilateral lower lobes. Otherwise no acute consolidative airspace disease, lung masses or significant pulmonary nodules. Upper abdomen: Moderate hiatal hernia. Contrast reflux into the IVC and hepatic veins. Musculoskeletal: No aggressive appearing focal osseous lesions. Marked thoracic spondylosis. Review of the MIP images confirms the above findings. IMPRESSION: 1. Motion degraded scan.  No evidence of pulmonary embolism. 2. Moderate bibasilar atelectasis. 3. Contrast reflux into the IVC and hepatic veins, suggesting elevated central venous pressures/ right heart failure. 4. Aortic atherosclerosis. Ascending thoracic aortic aneurysm measuring 4.6 cm in maximum diameter. Ascending thoracic aortic aneurysm. Recommend semi-annual imaging followup by CTA or MRA and referral to cardiothoracic surgery if not already obtained. This recommendation follows 2010 ACCF/AHA/AATS/ACR/ASA/SCA/SCAI/SIR/STS/SVM Guidelines for the Diagnosis and Management of Patients  With Thoracic Aortic Disease. Circulation. 2010; 121: Z610-R604. 5. Two vessel coronary atherosclerosis. 6. Moderate hiatal hernia. Electronically Signed   By: Delbert Phenix M.D.   On: 08/20/2016 21:21   Ct Abdomen Pelvis W Contrast  Result Date: 08/19/2016 CLINICAL DATA:  Right lower quadrant abdominal pain beginning 6 hours ago. EXAM: CT ABDOMEN AND PELVIS WITH CONTRAST TECHNIQUE: Multidetector CT imaging of the abdomen and pelvis was performed using the standard protocol following bolus administration of intravenous contrast. CONTRAST:  ISOVUE-300 IOPAMIDOL (ISOVUE-300) INJECTION 61% COMPARISON:  None. FINDINGS: Lower chest: Large hiatal hernia. Coronary artery calcification. No pleural or pericardial fluid. Hepatobiliary: Liver parenchyma is normal.  No calcified gallstones. Pancreas: Normal  Spleen: Normal Adrenals/Urinary Tract: Adrenal glands are normal. Left kidney is normal except for a 1.5 cm cyst. Right kidney contains a 1 cm stone in the renal pelvis which could cause ball valve obstruction. No evidence of ureteral stone or bladder stone. Stomach/Bowel: Normal appearing appendix.  No acute bowel pathology. Vascular/Lymphatic: Aortic atherosclerosis. No aneurysm. IVC is normal. No retroperitoneal adenopathy. Reproductive: Normal Other: No free fluid or air. Musculoskeletal: Curvature in chronic degenerative changes of the spine. IMPRESSION: Normal appearing appendix. No acute bowel pathology. No specific cause of right lower quadrant pain identified. 1 cm stone in the right renal pelvis. No obstruction currently, but this could result in ball valve obstruction. Aortic atherosclerosis. Large hiatal hernia. Coronary artery calcification. Electronically Signed   By: Paulina Fusi M.D.   On: 08/19/2016 20:10   US Renal  Result Date: 08/21/2016 CLINICAL DATA:  Sepsis, left kidney cyst. EXAM: RENAL / URINARY TRACT ULTRASOUND COMPLETE COMPARISON:  CT 08/19/2016 FINDINGS: Right Kidney: Length: 10.5 cm.  Echogenicity within normal limits. No mass or hydronephrosis visualized. 11 mm renal pelvic stone as seen on prior CT. Left Kidney: Length: 10.8 cm. 2.4 cm cyst in the lower pole. Normal echotexture. No hydronephrosis. Bladder: Appears normal for degree of bladder distention. IMPRESSION: No hydronephrosis.  11 mm right renal pelvic stone. 2.4 cm left lower pole renal cyst. Electronically Signed   By: Charlett Nose M.D.   On: 08/21/2016 12:06   Dg Chest Port 1 View  Result Date: 08/21/2016 CLINICAL DATA:  83 year old female with shortness of Breath for 3 days. EXAM: PORTABLE CHEST 1 VIEW COMPARISON:  CTA chest and portable chest radiograph 08/20/2016 FINDINGS: Portable AP upright view at 0814 hours. Continued low lung volumes. Increased pulmonary vascular congestion since yesterday. No pneumothorax. No definite pleural effusion. Continued bibasilar atelectasis. Stable cardiac size and mediastinal contours. Calcified aortic atherosclerosis. IMPRESSION: 1. Worsening pulmonary vascular congestion since yesterday, now suggestive of mild interstitial edema. 2. Continued low lung volumes with atelectasis. No definite pleural effusion. Electronically Signed   By: Odessa Fleming M.D.   On: 08/21/2016 08:31   Dg Chest Portable 1 View  Result Date: 08/20/2016 CLINICAL DATA:  Shortness of breath EXAM: PORTABLE CHEST 1 VIEW COMPARISON:  None FINDINGS: The heart size and mediastinal contours are within normal limits. Aortic atherosclerosis. Decreased lung volumes. Both lungs are clear. The visualized skeletal structures are unremarkable. IMPRESSION: 1. Low lung volumes. 2.  Aortic Atherosclerosis (ICD10-I70.0). Electronically Signed   By: Signa Kell M.D.   On: 08/20/2016 14:48   US Abdomen Limited Ruq  Result Date: 08/21/2016 CLINICAL DATA:  Abnormal LFTs. EXAM: US ABDOMEN LIMITED - RIGHT UPPER QUADRANT COMPARISON:  CT scan 08/19/2016 FINDINGS: Gallbladder: No gallstones or gallbladder wall thickening. No pericholecystic  fluid. The sonographer reports no sonographic Murphy's sign. Common bile duct: Diameter: 5 mm Liver: Increased echogenicity with decreased through transmission of liver parenchyma. No focal abnormality identified. IMPRESSION: No sonographic findings to explain the patient's history of abnormal LFTs. Electronically Signed   By: Kennith Center M.D.   On: 08/21/2016 12:02    ASSESSMENT AND PLAN:  1. Rapid atrial fibrillation: Currently in sinus rhythm. On oral digoxin. BP is stable. CHADSVASC score is 4 thus elevated thromboembolic risk. She has no prior h/o palpitations or atrial fibrillation. She is thrombocytopenic. I will await echocardiogram to see what left atrial size is to see propensity for atrial fibrillation, as current episode occurred in setting of urosepsis. Can likely use apixaban 5 mg bid but will  hold off for now. Hgb is stable. Will also hold off on additional AV nodal blocking agents.  2. Demand ischemia: CT angiogram of the chest did mention 2-vessel coronary atherosclerosis. No prior episodes of chest pain or shortness of breath until rapid atrial fibrillation development. Troponins not in a pattern of ACS and reflect demand ischemia. An outpatient stress test could be considered if she were to develop symptoms. Continue statin for now. On ASA as outpatient.  3. Urosepsis: Currently stable on IV antibiotics.  4. Pulmonary edema/acute hypoxic respiratory failure: Will await echocardiogram to assess cardiac structure and function. Likely due to rapid atrial fibrillation and IV fluid repletement. She received one dose of IV Lasix 20 mg earlier this morning but she still has rales and is orthopneic. I will given another 40 mg IV Lasix.   5. Thrombocytopenia: Likely due to Lovenox. Will follow counts. Most recently 67k.  6. Ascending thoracic aortic aneurysm: Will need to follow up with CT surgery as outpatient.   Signed: Prentice Docker, M.D., F.A.C.C.  08/21/2016, 3:08 PM

## 2016-08-21 NOTE — Progress Notes (Signed)
PROGRESS NOTE                                                                                                                                                                                                             Patient Demographics:    [name], is a 83 y.o. female, DOB - 05-09-1928, ZOX:[id number]  Admit date - 08/20/2016   Admitting Physician Pearson Grippe, MD  Outpatient Primary MD for the patient is Kaitlyn Melena, MD  LOS - 1  Chief Complaint  Patient presents with  . Shortness of Breath       Brief Narrative   84 year old female with history of hypertension, presents with complaints of dyspnea, recent ED visit on Sunday secondary to UTI(as well right renal pelvis 1 cm stone with tensional bowl obstruction), he was found to have sepsis secondary to UTI, as well as in A. fib with RVR, and respiratory failure secondary to volume overload.   Subjective:    Jetty Peeks today has, No headache, No chest pain, For generalized weakness and fatigue, denies any abdominal pain, reports her dyspnea is better .   Assessment  & Plan :    Active Problems:   Atrial fibrillation with rapid ventricular response (HCC)   Abnormal liver function   Dyspnea  Sepsis secondary to UTI - Patient presents with fever, tachycardia, tachypnea, hypotension, leukopenia> leukocytosis with elevated lactic acid. - No evidence of infectious process and CT chest. - Follow on blood cultures, and urine cultures - Continue with IV vancomycin and Rocephin for now, DC IV vancomycin in a.m. pending cultures, patient with renal stone and right renal pelvis, but no evidence of obstruction and recent CT chest, but suspicion for ball valve obstruction, discussed with urology Dr. Marlou Porch, giving no evidence of obstruction on ultrasound today, there is no indication for stent, but she needs close follow-up appointment with urology as outpatient  , she will need early involvement if  signs of obstruction arises - Continue to trend lactic acid, unfortunately not given IV fluids in the setting of her respiratory distress.  Acute hypoxic respiratory failure - Related to volume overload, most likely A. fib with RVR, and IV hydration secondary to sepsis, will continue with diuresis as needed, and may need BiPAP if decompensates.  A. fib with RVR - This is most likely related to sepsis  on presentation, required Cardizem GTT on admission, she converted back to normal sinus rhythm and currently  off Cardizem drip. - I have stopped her Lovenox given thrombocytopenia, will await cardiology consult recommendation regarding anticoagulation  Elevated troponin - Most likely in the setting of demand ischemia with hypoxia, and sepsis, troponin trend is not ACS pattern, 0.38> 0.27> 0.1 date , follow 2-D echo  Hypertension - cont hold meds given soft blood pressure   Hyperlipidemia  - Resume statin when stable   Thrombocytopenia - Most likely in the setting of sepsis, monitor closely, will DC subcutaneous Lovenox   Code Status : full  Family Communication  : son at bedside  Disposition Plan  : remains in [facility]  Consults  : D/W urology , cardiology  Procedures  : None  DVT Prophylaxis  :  SCDs , will resume on subcutaneous Lovenox for thrombocytopenia improves  Lab Results  Component Value Date   PLT 67 (L) 08/21/2016    Antibiotics  :   Anti-infectives    Start     Dose/Rate Route Frequency Ordered Stop   08/21/16 1500  cefTRIAXone (ROCEPHIN) 1 g in dextrose 5 % 50 mL IVPB     1 g 100 mL/hr over 30 Minutes Intravenous Every 24 hours 08/20/16 1854 08/27/16 1459   08/21/16 1500  cefTRIAXone (ROCEPHIN) 1 g in dextrose 5 % 50 mL IVPB  Status:  Discontinued     1 g 100 mL/hr over 30 Minutes Intravenous Every 24 hours 08/20/16 1832 08/20/16 1900   08/21/16 0600  vancomycin (VANCOCIN) IVPB 750 mg/150 ml premix     750 mg 150 mL/hr over 60 Minutes Intravenous Every  24 hours 08/20/16 1854     08/20/16 1515  vancomycin (VANCOCIN) IVPB 1000 mg/200 mL premix     1,000 mg 200 mL/hr over 60 Minutes Intravenous  Once 08/20/16 1506 08/20/16 1703   08/20/16 1500  cefTRIAXone (ROCEPHIN) 1 g in dextrose 5 % 50 mL IVPB     1 g 100 mL/hr over 30 Minutes Intravenous  Once 08/20/16 1458 08/20/16 1550   08/20/16 1500  azithromycin (ZITHROMAX) 500 mg in dextrose 5 % 250 mL IVPB     500 mg 250 mL/hr over 60 Minutes Intravenous  Once 08/20/16 1458 08/20/16 1652        Objective:   Vitals:   08/21/16 0800 08/21/16 0900 08/21/16 1000 08/21/16 1100  BP: (!) 108/92 117/67 97/64 113/81  Pulse: 71 74 78 80  Resp: 15 20 (!) 23 (!) 21  Temp:    98.6 F (37 C)  TempSrc:    Oral  SpO2: 97% 98% 96% 94%  Weight:      Height:        Wt Readings from Last 3 Encounters:  08/21/16 71.8 kg (158 lb 4.6 oz)  08/19/16 65.8 kg (145 lb)  05/04/16 65.6 kg (144 lb 9.6 oz)     Intake/Output Summary (Last 24 hours) at 08/21/16 1346 Last data filed at 08/21/16 1300  Gross per 24 hour  Intake          3722.12 ml  Output              800 ml  Net          2922.12 ml     Physical Exam  Awake Alert, Oriented X 3, Frail, ill-appearing Supple Neck,No JVD, Symmetrical Chest wall movement, tachypneic with some use of accessory muscle, as well as respiratory distress, bibasilar crackles, and  scattered wheezing . RRR,No Gallops,Rubs or new Murmurs, No Parasternal Heave +ve B.Sounds, Abd Soft, No tenderness, No CVA tenderness, No rebound - guarding or rigidity. No Cyanosis, Clubbing or edema, No new Rash or bruise      Data Review:    CBC  Recent Labs Lab 08/19/16 1842 08/20/16 1437 08/20/16 1443 08/21/16 0652  WBC 3.4* 2.4*  --  18.4*  HGB 16.9* 14.9 15.3* 13.3  HCT 50.9* 45.8 45.0 40.9  PLT 101* 79*  --  67*  MCV 92.2 93.1  --  93.4  MCH 30.6 30.3  --  30.4  MCHC 33.2 32.5  --  32.5  RDW 12.8 13.4  --  13.5  LYMPHSABS 0.2* 0.6*  --   --   MONOABS 0.0* 0.0*   --   --   EOSABS 0.0 0.0  --   --   BASOSABS 0.0 0.0  --   --     Chemistries   Recent Labs Lab 08/19/16 1842 08/20/16 1437 08/20/16 1443 08/21/16 0652  NA 141 140 140 135  K 3.4* 3.8 3.7 4.0  CL 99* 100* 99* 103  CO2 29 26  --  23  GLUCOSE 101* 98 96 205*  BUN 22* 22* 24* 27*  CREATININE 1.17* 1.27* 1.30* 1.12*  CALCIUM 9.9 8.7*  --  7.6*  AST 67* 51*  --  39  ALT 38 40  --  37  ALKPHOS 79 121  --  51  BILITOT 1.6* 1.0  --  0.7   ------------------------------------------------------------------------------------------------------------------ No results for input(s): CHOL, HDL, LDLCALC, TRIG, CHOLHDL, LDLDIRECT in the last 72 hours.  No results found for: HGBA1C ------------------------------------------------------------------------------------------------------------------ No results for input(s): TSH, T4TOTAL, T3FREE, THYROIDAB in the last 72 hours.  Invalid input(s): FREET3 ------------------------------------------------------------------------------------------------------------------ No results for input(s): VITAMINB12, FOLATE, FERRITIN, TIBC, IRON, RETICCTPCT in the last 72 hours.  Coagulation profile No results for input(s): INR, PROTIME in the last 168 hours.   Recent Labs  08/20/16 1456  DDIMER >20.00*    Cardiac Enzymes  Recent Labs Lab 08/20/16 1937 08/21/16 0039 08/21/16 0653  TROPONINI 0.38* 0.27* 0.18*   ------------------------------------------------------------------------------------------------------------------    Component Value Date/Time   BNP 514.0 (H) 08/20/2016 1437    Inpatient Medications  Scheduled Meds: . digoxin  0.125 mg Oral Daily  . enoxaparin (LOVENOX) injection  70 mg Subcutaneous Q24H  . gabapentin  300 mg Oral QHS  . pantoprazole  40 mg Oral Daily  . pravastatin  40 mg Oral QHS  . timolol  1 drop Both Eyes Daily   Continuous Infusions: . cefTRIAXone (ROCEPHIN)  IV    . diltiazem (CARDIZEM) infusion  Stopped (08/21/16 0920)  . vancomycin Stopped (08/21/16 0640)   PRN Meds:.levalbuterol  Micro Results Recent Results (from the past 240 hour(s))  Urine culture     Status: Abnormal   Collection Time: 08/19/16  6:05 PM  Result Value Ref Range Status   Specimen Description URINE, RANDOM  Final   Special Requests NONE  Final   Culture MULTIPLE SPECIES PRESENT, SUGGEST RECOLLECTION (A)  Final   Report Status 08/21/2016 FINAL  Final  Blood Culture (routine x 2)     Status: None (Preliminary result)   Collection Time: 08/20/16  3:10 PM  Result Value Ref Range Status   Specimen Description LEFT ANTECUBITAL  Final   Special Requests   Final    BOTTLES DRAWN AEROBIC AND ANAEROBIC Blood Culture adequate volume   Culture NO GROWTH < 24 HOURS  Final  Report Status PENDING  Incomplete  Blood Culture (routine x 2)     Status: None (Preliminary result)   Collection Time: 08/20/16  3:16 PM  Result Value Ref Range Status   Specimen Description BLOOD LEFT ARM  Final   Special Requests   Final    BOTTLES DRAWN AEROBIC AND ANAEROBIC Blood Culture adequate volume   Culture NO GROWTH < 24 HOURS  Final   Report Status PENDING  Incomplete  MRSA PCR Screening     Status: None   Collection Time: 08/20/16  7:13 PM  Result Value Ref Range Status   MRSA by PCR NEGATIVE NEGATIVE Final    Comment:        The GeneXpert MRSA Assay (FDA approved for NASAL specimens only), is one component of a comprehensive MRSA colonization surveillance program. It is not intended to diagnose MRSA infection nor to guide or monitor treatment for MRSA infections.     Radiology Reports Ct Angio Chest Pe W Or Wo Contrast  Result Date: 08/20/2016 CLINICAL DATA:  Worsening dyspnea for 2 days.  Inpatient. EXAM: CT ANGIOGRAPHY CHEST WITH CONTRAST TECHNIQUE: Multidetector CT imaging of the chest was performed using the standard protocol during bolus administration of intravenous contrast. Multiplanar CT image  reconstructions and MIPs were obtained to evaluate the vascular anatomy. CONTRAST:  75 cc Isovue 370 IV. COMPARISON:  Chest radiograph from earlier today. FINDINGS: Motion degraded scan. Cardiovascular: The study is moderate quality for the evaluation of pulmonary embolism, with evaluation of the segmental and subsegmental pulmonary arteries limited by motion artifact. There are no filling defects in the central, lobar, segmental or subsegmental pulmonary artery branches to suggest acute pulmonary embolism. Atherosclerotic thoracic aorta. Aneurysmal 4.6 cm ascending thoracic aorta. Normal caliber pulmonary arteries. Normal heart size. No significant pericardial fluid/thickening. Left anterior descending and left circumflex coronary atherosclerosis. Mediastinum/Nodes: No discrete thyroid nodules. Unremarkable esophagus. No pathologically enlarged axillary, mediastinal or hilar lymph nodes. Lungs/Pleura: No pneumothorax. No pleural effusion. Moderate atelectasis in the medial/ dependent bilateral lower lobes. Otherwise no acute consolidative airspace disease, lung masses or significant pulmonary nodules. Upper abdomen: Moderate hiatal hernia. Contrast reflux into the IVC and hepatic veins. Musculoskeletal: No aggressive appearing focal osseous lesions. Marked thoracic spondylosis. Review of the MIP images confirms the above findings. IMPRESSION: 1. Motion degraded scan.  No evidence of pulmonary embolism. 2. Moderate bibasilar atelectasis. 3. Contrast reflux into the IVC and hepatic veins, suggesting elevated central venous pressures/ right heart failure. 4. Aortic atherosclerosis. Ascending thoracic aortic aneurysm measuring 4.6 cm in maximum diameter. Ascending thoracic aortic aneurysm. Recommend semi-annual imaging followup by CTA or MRA and referral to cardiothoracic surgery if not already obtained. This recommendation follows 2010 ACCF/AHA/AATS/ACR/ASA/SCA/SCAI/SIR/STS/SVM Guidelines for the Diagnosis and  Management of Patients With Thoracic Aortic Disease. Circulation. 2010; 121: Z610-R604. 5. Two vessel coronary atherosclerosis. 6. Moderate hiatal hernia. Electronically Signed   By: Delbert Phenix M.D.   On: 08/20/2016 21:21   Ct Abdomen Pelvis W Contrast  Result Date: 08/19/2016 CLINICAL DATA:  Right lower quadrant abdominal pain beginning 6 hours ago. EXAM: CT ABDOMEN AND PELVIS WITH CONTRAST TECHNIQUE: Multidetector CT imaging of the abdomen and pelvis was performed using the standard protocol following bolus administration of intravenous contrast. CONTRAST:  ISOVUE-300 IOPAMIDOL (ISOVUE-300) INJECTION 61% COMPARISON:  None. FINDINGS: Lower chest: Large hiatal hernia. Coronary artery calcification. No pleural or pericardial fluid. Hepatobiliary: Liver parenchyma is normal.  No calcified gallstones. Pancreas: Normal Spleen: Normal Adrenals/Urinary Tract: Adrenal glands are normal. Left kidney is  normal except for a 1.5 cm cyst. Right kidney contains a 1 cm stone in the renal pelvis which could cause ball valve obstruction. No evidence of ureteral stone or bladder stone. Stomach/Bowel: Normal appearing appendix.  No acute bowel pathology. Vascular/Lymphatic: Aortic atherosclerosis. No aneurysm. IVC is normal. No retroperitoneal adenopathy. Reproductive: Normal Other: No free fluid or air. Musculoskeletal: Curvature in chronic degenerative changes of the spine. IMPRESSION: Normal appearing appendix. No acute bowel pathology. No specific cause of right lower quadrant pain identified. 1 cm stone in the right renal pelvis. No obstruction currently, but this could result in ball valve obstruction. Aortic atherosclerosis. Large hiatal hernia. Coronary artery calcification. Electronically Signed   By: Paulina Fusi M.D.   On: 08/19/2016 20:10   US Renal  Result Date: 08/21/2016 CLINICAL DATA:  Sepsis, left kidney cyst. EXAM: RENAL / URINARY TRACT ULTRASOUND COMPLETE COMPARISON:  CT 08/19/2016 FINDINGS: Right  Kidney: Length: 10.5 cm. Echogenicity within normal limits. No mass or hydronephrosis visualized. 11 mm renal pelvic stone as seen on prior CT. Left Kidney: Length: 10.8 cm. 2.4 cm cyst in the lower pole. Normal echotexture. No hydronephrosis. Bladder: Appears normal for degree of bladder distention. IMPRESSION: No hydronephrosis.  11 mm right renal pelvic stone. 2.4 cm left lower pole renal cyst. Electronically Signed   By: Charlett Nose M.D.   On: 08/21/2016 12:06   Dg Chest Port 1 View  Result Date: 08/21/2016 CLINICAL DATA:  83 year old female with shortness of Breath for 3 days. EXAM: PORTABLE CHEST 1 VIEW COMPARISON:  CTA chest and portable chest radiograph 08/20/2016 FINDINGS: Portable AP upright view at 0814 hours. Continued low lung volumes. Increased pulmonary vascular congestion since yesterday. No pneumothorax. No definite pleural effusion. Continued bibasilar atelectasis. Stable cardiac size and mediastinal contours. Calcified aortic atherosclerosis. IMPRESSION: 1. Worsening pulmonary vascular congestion since yesterday, now suggestive of mild interstitial edema. 2. Continued low lung volumes with atelectasis. No definite pleural effusion. Electronically Signed   By: Odessa Fleming M.D.   On: 08/21/2016 08:31   Dg Chest Portable 1 View  Result Date: 08/20/2016 CLINICAL DATA:  Shortness of breath EXAM: PORTABLE CHEST 1 VIEW COMPARISON:  None FINDINGS: The heart size and mediastinal contours are within normal limits. Aortic atherosclerosis. Decreased lung volumes. Both lungs are clear. The visualized skeletal structures are unremarkable. IMPRESSION: 1. Low lung volumes. 2.  Aortic Atherosclerosis (ICD10-I70.0). Electronically Signed   By: Signa Kell M.D.   On: 08/20/2016 14:48   US Abdomen Limited Ruq  Result Date: 08/21/2016 CLINICAL DATA:  Abnormal LFTs. EXAM: US ABDOMEN LIMITED - RIGHT UPPER QUADRANT COMPARISON:  CT scan 08/19/2016 FINDINGS: Gallbladder: No gallstones or gallbladder wall  thickening. No pericholecystic fluid. The sonographer reports no sonographic Murphy's sign. Common bile duct: Diameter: 5 mm Liver: Increased echogenicity with decreased through transmission of liver parenchyma. No focal abnormality identified. IMPRESSION: No sonographic findings to explain the patient's history of abnormal LFTs. Electronically Signed   By: Kennith Center M.D.   On: 08/21/2016 12:02     Marquelle Balow M.D on 08/21/2016 at 1:46 PM  Between 7am to 7pm - Pager - [phone]  After 7pm go to [email or web address] - password [facility]  [facility] -  Office  [phone]

## 2016-08-21 NOTE — Consult Note (Signed)
   [facility] CM Inpatient Consult   08/21/2016  [name] [birth year] [id number]  Chart review revealed patient eligible for [facility] services and post hospital discharge follow up related to a diagnosis of A-fib and HTN. Patient was evaluated for telephonic based chronic disease management services with [facility] care Management Program as a benefit of patient's [employer]. Met with the patient at the bedside to explain [facility] Management services. Patient endorses her primary care provider to be Dr. Allyn Kenner.  Consent  Form not signed. Verbal consent given. Patient gave [phone] as the best number to reach her. Patient will receive post hospital discharge calls and be evaluated for all [facility] services. [facility] Care Management does not interfere with or replace any services arranged by the inpatient care management team. RNCM left contact information and [facility] literature at the bedside. Made inpatient RNCM aware that [facility] will be following for care management. For additional questions please contact:   Allisa Einspahr RN, [facility] Liaison  [phone]) Business Mobile [phone]) Toll free office

## 2016-08-21 NOTE — Progress Notes (Signed)
Patient has no issues using bedside commode. No urine sample collected due to patient having stool mixed with urine.

## 2016-08-21 NOTE — Progress Notes (Signed)
*  PRELIMINARY RESULTS* Echocardiogram 2D Echocardiogram has been performed.  [name] 08/21/2016, 4:21 PM

## 2016-08-22 DIAGNOSIS — A419 Sepsis, unspecified organism: Secondary | ICD-10-CM | POA: Diagnosis present

## 2016-08-22 DIAGNOSIS — J9601 Acute respiratory failure with hypoxia: Secondary | ICD-10-CM | POA: Diagnosis present

## 2016-08-22 DIAGNOSIS — N390 Urinary tract infection, site not specified: Secondary | ICD-10-CM

## 2016-08-22 DIAGNOSIS — I248 Other forms of acute ischemic heart disease: Secondary | ICD-10-CM | POA: Diagnosis present

## 2016-08-22 DIAGNOSIS — I712 Thoracic aortic aneurysm, without rupture: Secondary | ICD-10-CM | POA: Diagnosis present

## 2016-08-22 DIAGNOSIS — I5030 Unspecified diastolic (congestive) heart failure: Secondary | ICD-10-CM

## 2016-08-22 DIAGNOSIS — D696 Thrombocytopenia, unspecified: Secondary | ICD-10-CM | POA: Diagnosis present

## 2016-08-22 DIAGNOSIS — I7121 Aneurysm of the ascending aorta, without rupture: Secondary | ICD-10-CM | POA: Diagnosis present

## 2016-08-22 DIAGNOSIS — N200 Calculus of kidney: Secondary | ICD-10-CM

## 2016-08-22 LAB — URINALYSIS, ROUTINE W REFLEX MICROSCOPIC
Bacteria, UA: NONE SEEN
Bilirubin Urine: NEGATIVE
Glucose, UA: NEGATIVE mg/dL
Ketones, ur: NEGATIVE mg/dL
Leukocytes, UA: NEGATIVE
Nitrite: NEGATIVE
Protein, ur: NEGATIVE mg/dL
Specific Gravity, Urine: 1.008 (ref 1.005–1.030)
pH: 5 (ref 5.0–8.0)

## 2016-08-22 LAB — BASIC METABOLIC PANEL
Anion gap: 10 (ref 5–15)
BUN: 28 mg/dL — AB (ref 6–20)
CHLORIDE: 102 mmol/L (ref 101–111)
CO2: 28 mmol/L (ref 22–32)
CREATININE: 0.85 mg/dL (ref 0.44–1.00)
Calcium: 8.2 mg/dL — ABNORMAL LOW (ref 8.9–10.3)
GFR calc Af Amer: >60 mL/min (ref >60)
GFR calc non Af Amer: >60 mL/min (ref >60)
Glucose, Bld: 158 mg/dL — ABNORMAL HIGH (ref 65–99)
POTASSIUM: 3.4 mmol/L — AB (ref 3.5–5.1)
Sodium: 140 mmol/L (ref 135–145)

## 2016-08-22 LAB — CBC
HEMATOCRIT: 40.6 % (ref 36.0–46.0)
HEMOGLOBIN: 13.6 g/dL (ref 12.0–15.0)
MCH: 31.2 pg (ref 26.0–34.0)
MCHC: 33.5 g/dL (ref 30.0–36.0)
MCV: 93.1 fL (ref 78.0–100.0)
Platelets: 76 10*3/uL — ABNORMAL LOW (ref 150–400)
RBC: 4.36 MIL/uL (ref 3.87–5.11)
RDW: 13.4 % (ref 11.5–15.5)
WBC: 20 10*3/uL — ABNORMAL HIGH (ref 4.0–10.5)

## 2016-08-22 LAB — HEPATITIS PANEL, ACUTE
HCV Ab: <0.1 s/co ratio (ref 0.0–0.9)
Hep A IgM: NEGATIVE
Hep B C IgM: NEGATIVE
Hepatitis B Surface Ag: NEGATIVE

## 2016-08-22 LAB — HIV ANTIBODY (ROUTINE TESTING W REFLEX): HIV Screen 4th Generation wRfx: NONREACTIVE

## 2016-08-22 LAB — STREP PNEUMONIAE URINARY ANTIGEN: Strep Pneumo Urinary Antigen: NEGATIVE

## 2016-08-22 MED ORDER — DILTIAZEM HCL-DEXTROSE 100-5 MG/100ML-% IV SOLN (PREMIX)
5.0000 mg/h | INTRAVENOUS | Status: DC
  Administered 2016-08-22: 5 mg/h via INTRAVENOUS
  Filled 2016-08-22: qty 100

## 2016-08-22 MED ORDER — FUROSEMIDE 10 MG/ML IJ SOLN
20.0000 mg | Freq: Once | INTRAMUSCULAR | Status: AC
  Administered 2016-08-22: 20 mg via INTRAVENOUS
  Filled 2016-08-22: qty 2

## 2016-08-22 MED ORDER — VANCOMYCIN HCL IN DEXTROSE 750-5 MG/150ML-% IV SOLN
750.0000 mg | Freq: Two times a day (BID) | INTRAVENOUS | Status: DC
  Filled 2016-08-22 (×2): qty 150

## 2016-08-22 MED ORDER — LORAZEPAM 0.5 MG PO TABS
0.5000 mg | ORAL_TABLET | Freq: Two times a day (BID) | ORAL | Status: DC | PRN
  Administered 2016-08-22: 0.5 mg via ORAL
  Filled 2016-08-22: qty 1

## 2016-08-22 MED ORDER — DILTIAZEM HCL 30 MG PO TABS
30.0000 mg | ORAL_TABLET | Freq: Two times a day (BID) | ORAL | Status: DC
  Administered 2016-08-22 (×2): 30 mg via ORAL
  Filled 2016-08-22 (×2): qty 1

## 2016-08-22 NOTE — Plan of Care (Signed)
Problem: Pain Managment: Goal: General experience of comfort will improve Outcome: Progressing Pt able to corretly demonstrate use of call light. Bed in low position, side rails up, call bell and personal items within reach.

## 2016-08-22 NOTE — Progress Notes (Signed)
Pharmacy Antibiotic Note  [name] is a 83 y.o. female admitted on 08/20/2016 with pneumonia.  Pharmacy has been consulted for Vancomycin dosing.  SCr has improved.    Plan: Increase Vancomycin to  IV q12hrs (consider d/c Vanc as pcr (-) Monitor labs, progress, c/s  Height:  (160 cm) Weight: 158 lb 4.6 oz (71.8 kg) IBW/kg (Calculated) : 52.4  Temp (24hrs), Avg:97.9 F (36.6 C), Min:97.6 F (36.4 C), Max:98.2 F (36.8 C)   Recent Labs Lab 08/19/16 1842 08/19/16 2104 08/20/16 1437 08/20/16 1443 08/20/16 1641 08/20/16 1937 08/21/16 0652 08/21/16 0814 08/21/16 1146 08/22/16 0514  WBC 3.4*  --  2.4*  --   --   --  18.4*  --   --  20.0*  CREATININE 1.17*  --  1.27* 1.30*  --   --  1.12*  --   --  0.85  LATICACIDVEN 4.7* 2.3*  --   --  3.1* 2.7*  --  2.1* 3.7*  --     Estimated Creatinine Clearance: 45.2 mL/min (by C-G formula based on SCr of 0.85 mg/dL).    Allergies  Allergen Reactions  . Sulfa Antibiotics Other (See Comments)    Pt states that this med makes her feel crazy.     Antimicrobials this admission: Vancomycin 4/30 >>  Rocephin 4/30 >>  Zithromax 4/30 >> Dose adjustments this admission:  Microbiology results:  BCx: pending  UCx: pending   Sputum:    MRSA PCR: negative  Thank you for allowing pharmacy to be a part of this patient's care.  Valrie Hart A 08/22/2016 11:09 AM

## 2016-08-22 NOTE — Progress Notes (Addendum)
Progress Note  Patient Name: [name] Date of Encounter: 08/22/2016  Primary Cardiologist: Dr. Purvis Sheffield  Subjective   Sitting up in chair. Feeling much better  Inpatient Medications    Scheduled Meds: . digoxin  0.125 mg Oral Daily  . gabapentin  300 mg Oral QHS  . pantoprazole  40 mg Oral Daily  . pravastatin  40 mg Oral QHS  . timolol  1 drop Both Eyes Daily   Continuous Infusions: . cefTRIAXone (ROCEPHIN)  IV Stopped (08/21/16 1603)  . diltiazem (CARDIZEM) infusion Stopped (08/21/16 0920)  . vancomycin Stopped (08/22/16 0700)   PRN Meds: levalbuterol   Vital Signs    Vitals:   08/22/16 0200 08/22/16 0300 08/22/16 0400 08/22/16 0714  BP: 98/61 103/60 119/69   Pulse: 68 67 75 73  Resp: Temp:    97.8 F (36.6 C)  TempSrc:    Oral  SpO2: 99% 99% 98% 98%  Weight:      Height:        Intake/Output Summary (Last 24 hours) at 08/22/16 0802 Last data filed at 08/22/16 0400  Gross per 24 hour  Intake           513.75 ml  Output             1700 ml  Net         -1186.25 ml   Filed Weights   08/20/16 1431 08/21/16 0500  Weight: 145 lb (65.8 kg) 158 lb 4.6 oz (71.8 kg)    Telemetry    NSR with PVC's and NSVT- Personally Reviewed  ECG     Physical Exam    GEN: No acute distress.   Neck: No JVD Cardiac: RRR, no murmurs, rubs, or gallops.  Respiratory:Decreased breath sounds with fine crackles at lung bases left>right GI: Soft, nontender, non-distended  MS: No edema; No deformity. Neuro:  Nonfocal  Psych: Normal affect   Labs    Chemistry Recent Labs Lab 08/19/16 1842 08/20/16 1437 08/20/16 1443 08/21/16 0652 08/22/16 0514  NA 141 140 140 135 140  K 3.4* 3.8 3.7 4.0 3.4*  CL 99* 100* 99* 103 102  CO2 29 26  --  23 28  GLUCOSE 101* 98 96 205* 158*  BUN 22* 22* 24* 27* 28*  CREATININE 1.17* 1.27* 1.30* 1.12* 0.85  CALCIUM 9.9 8.7*  --  7.6* 8.2*  PROT 7.8 7.1  --  6.2*  --   ALBUMIN 4.5 3.9  --  3.2*  --   AST 67*  51*  --  39  --   ALT 38 40  --  37  --   ALKPHOS 79 121  --  51  --   BILITOT 1.6* 1.0  --  0.7  --   GFRNONAA 41* 37*  --  44* >60  GFRAA 48* 43*  --  50* >60  ANIONGAP 13 14  --  9 10     Hematology Recent Labs Lab 08/20/16 1437 08/20/16 1443 08/21/16 0652 08/22/16 0514  WBC 2.4*  --  18.4* 20.0*  RBC 4.92  --  4.38 4.36  HGB 14.9 15.3* 13.3 13.6  HCT 45.8 45.0 40.9 40.6  MCV 93.1  --  93.4 93.1  MCH 30.3  --  30.4 31.2  MCHC 32.5  --  32.5 33.5  RDW 13.4  --  13.5 13.4  PLT 79*  --  67* 76*    Cardiac Enzymes Recent Labs Lab 08/20/16 1937  08/21/16 0039 08/21/16 0653  TROPONINI 0.38* 0.27* 0.18*    Recent Labs Lab 08/20/16 1441  TROPIPOC 0.15*     BNP Recent Labs Lab 08/20/16 1437  BNP 514.0*     DDimer  Recent Labs Lab 08/20/16 1456  DDIMER >20.00*     Radiology    Ct Angio Chest Pe W Or Wo Contrast  Result Date: 08/20/2016 CLINICAL DATA:  Worsening dyspnea for 2 days.  Inpatient. EXAM: CT ANGIOGRAPHY CHEST WITH CONTRAST TECHNIQUE: Multidetector CT imaging of the chest was performed using the standard protocol during bolus administration of intravenous contrast. Multiplanar CT image reconstructions and MIPs were obtained to evaluate the vascular anatomy. CONTRAST:  75 cc Isovue 370 IV. COMPARISON:  Chest radiograph from earlier today. FINDINGS: Motion degraded scan. Cardiovascular: The study is moderate quality for the evaluation of pulmonary embolism, with evaluation of the segmental and subsegmental pulmonary arteries limited by motion artifact. There are no filling defects in the central, lobar, segmental or subsegmental pulmonary artery branches to suggest acute pulmonary embolism. Atherosclerotic thoracic aorta. Aneurysmal 4.6 cm ascending thoracic aorta. Normal caliber pulmonary arteries. Normal heart size. No significant pericardial fluid/thickening. Left anterior descending and left circumflex coronary atherosclerosis. Mediastinum/Nodes: No  discrete thyroid nodules. Unremarkable esophagus. No pathologically enlarged axillary, mediastinal or hilar lymph nodes. Lungs/Pleura: No pneumothorax. No pleural effusion. Moderate atelectasis in the medial/ dependent bilateral lower lobes. Otherwise no acute consolidative airspace disease, lung masses or significant pulmonary nodules. Upper abdomen: Moderate hiatal hernia. Contrast reflux into the IVC and hepatic veins. Musculoskeletal: No aggressive appearing focal osseous lesions. Marked thoracic spondylosis. Review of the MIP images confirms the above findings. IMPRESSION: 1. Motion degraded scan.  No evidence of pulmonary embolism. 2. Moderate bibasilar atelectasis. 3. Contrast reflux into the IVC and hepatic veins, suggesting elevated central venous pressures/ right heart failure. 4. Aortic atherosclerosis. Ascending thoracic aortic aneurysm measuring 4.6 cm in maximum diameter. Ascending thoracic aortic aneurysm. Recommend semi-annual imaging followup by CTA or MRA and referral to cardiothoracic surgery if not already obtained. This recommendation follows 2010 ACCF/AHA/AATS/ACR/ASA/SCA/SCAI/SIR/STS/SVM Guidelines for the Diagnosis and Management of Patients With Thoracic Aortic Disease. Circulation. 2010; 121: W098-J191. 5. Two vessel coronary atherosclerosis. 6. Moderate hiatal hernia. Electronically Signed   By: Delbert Phenix M.D.   On: 08/20/2016 21:21   US Renal  Result Date: 08/21/2016 CLINICAL DATA:  Sepsis, left kidney cyst. EXAM: RENAL / URINARY TRACT ULTRASOUND COMPLETE COMPARISON:  CT 08/19/2016 FINDINGS: Right Kidney: Length: 10.5 cm. Echogenicity within normal limits. No mass or hydronephrosis visualized. 11 mm renal pelvic stone as seen on prior CT. Left Kidney: Length: 10.8 cm. 2.4 cm cyst in the lower pole. Normal echotexture. No hydronephrosis. Bladder: Appears normal for degree of bladder distention. IMPRESSION: No hydronephrosis.  11 mm right renal pelvic stone. 2.4 cm left lower pole  renal cyst. Electronically Signed   By: Charlett Nose M.D.   On: 08/21/2016 12:06   Dg Chest Port 1 View  Result Date: 08/21/2016 CLINICAL DATA:  83 year old female with shortness of Breath for 3 days. EXAM: PORTABLE CHEST 1 VIEW COMPARISON:  CTA chest and portable chest radiograph 08/20/2016 FINDINGS: Portable AP upright view at 0814 hours. Continued low lung volumes. Increased pulmonary vascular congestion since yesterday. No pneumothorax. No definite pleural effusion. Continued bibasilar atelectasis. Stable cardiac size and mediastinal contours. Calcified aortic atherosclerosis. IMPRESSION: 1. Worsening pulmonary vascular congestion since yesterday, now suggestive of mild interstitial edema. 2. Continued low lung volumes with atelectasis. No definite pleural effusion. Electronically Signed  By: Odessa Fleming M.D.   On: 08/21/2016 08:31   Dg Chest Portable 1 View  Result Date: 08/20/2016 CLINICAL DATA:  Shortness of breath EXAM: PORTABLE CHEST 1 VIEW COMPARISON:  None FINDINGS: The heart size and mediastinal contours are within normal limits. Aortic atherosclerosis. Decreased lung volumes. Both lungs are clear. The visualized skeletal structures are unremarkable. IMPRESSION: 1. Low lung volumes. 2.  Aortic Atherosclerosis (ICD10-I70.0). Electronically Signed   By: Signa Kell M.D.   On: 08/20/2016 14:48   US Abdomen Limited Ruq  Result Date: 08/21/2016 CLINICAL DATA:  Abnormal LFTs. EXAM: US ABDOMEN LIMITED - RIGHT UPPER QUADRANT COMPARISON:  CT scan 08/19/2016 FINDINGS: Gallbladder: No gallstones or gallbladder wall thickening. No pericholecystic fluid. The sonographer reports no sonographic Murphy's sign. Common bile duct: Diameter: 5 mm Liver: Increased echogenicity with decreased through transmission of liver parenchyma. No focal abnormality identified. IMPRESSION: No sonographic findings to explain the patient's history of abnormal LFTs. Electronically Signed   By: Kennith Center M.D.   On: 08/21/2016  12:02    Cardiac Studies   2-D echo 08/21/16 Study Conclusions   - Left ventricle: The cavity size was normal. Wall thickness was   normal. Systolic function was normal. The estimated ejection   fraction was 60%. Wall motion was normal; there were no regional   wall motion abnormalities. Doppler parameters are consistent with   abnormal left ventricular relaxation (grade 1 diastolic   dysfunction). - Aortic valve: There was mild regurgitation. - Mitral valve: Mildly calcified annulus. Mildly thickened leaflets   . There was mild to moderate regurgitation. - Left atrium: The atrium was moderately dilated. - Right atrium: The atrium was mildly dilated. - Tricuspid valve: There was moderate regurgitation. - Pulmonary arteries: PA peak pressure: 54 mm Hg (S). - Inferior vena cava: The vessel was dilated. The respirophasic   diameter changes were blunted (< 50%), consistent with elevated   central venous pressure. Estimated CVP 15 mmHg.     Patient Profile     83 y.o. female admitted with urosepsis and developed rapid atrial fibrillation and pulmonary edema. Also thrombocytopenia.  Assessment & Plan    1. Rapid atrial fibrillation: Currently in sinus rhythm. On oral digoxin. BP is stable. CHADSVASC score is 4 thus elevated thromboembolic risk. She has no prior h/o palpitations or atrial fibrillation. She is thrombocytopenic.  echocardiogram left atrial size is Moderately dilated so likely can go back into atrial fibrillation in the future. Can likely use apixaban 5 mg bid but will hold off for now. Hgb is stable. Consider low dose beta blocker instead of Digoxin given her age   2. Demand ischemia: CT angiogram of the chest did mention 2-vessel coronary atherosclerosis. No prior episodes of chest pain or shortness of breath until rapid atrial fibrillation development. Troponins not in a pattern of ACS and reflect demand ischemia. An outpatient stress test could be considered if she were to  develop symptoms. Continue statin for now. On ASA as outpatient.   3. Urosepsis: Currently stable on IV antibiotics.WBC 20K   4. Pulmonary edema/acute hypoxic respiratory failure:  Likely due to rapid atrial fibrillation and IV fluid repletement.Diuresed 1186 cc after 40 mg IV Lasix. Normal LV function with grade 1 DD on echo and increase CVP 15 mmHg.  Would give another dose of Lasix 20 mg IV today.   5. Thrombocytopenia: Likely due to Lovenox. Will follow counts. Most recently 76k.   6. Ascending thoracic aortic aneurysm: Will need to follow up with  CT surgery as outpatient every 6 months.     Signed, Jacolyn Reedy, PA-C  08/22/2016, 8:02 AM    The patient was seen and examined, and I agree with the history, physical exam, assessment and plan as documented above, with modifications as noted below. Her breathing has improved significantly after one dose of IV Lasix 40 mg with 1.2 L output. I agree with giving another dose as she still has some rales at the bases and CVP was high yesterday based on echo findings.  She denies chest pain and palpitations. Platelets are trending up to 76k today. Currently on digoxin. If BP remains stable, can switch to diltiazem 30 mg bid. Once platelets continue to trend up, can initiate apixaban for anticoagulation (moderate left atrial enlargement would suggest atrial fibrillation recurrence is likely). Otherwise, continue current management.  Prentice Docker, MD, [facility]  08/22/2016 9:17 AM

## 2016-08-22 NOTE — Progress Notes (Signed)
Patient went back into Afib this afternoon and was running in the 130s-140s. Paged Dr. Kerry Hough who decided to put her back on a Cardizem drip. Currently, VSS and she is tolerating the Cardizem drip. Patient also requesting something for her nerves at this time.

## 2016-08-22 NOTE — Progress Notes (Addendum)
PROGRESS NOTE    [name]  ZOX:[id number] DOB: [birth year] DOA: 08/20/2016 PCP: [name], MD   Brief Narrative:  83 year old female with a history of hypertension, presents to the hospital with complaints of dyspnea. She was found to be in sepsis which was felt to be related to urinary tract infection. She also had rapid atrial fibrillation and acute respiratory failure. She was started on intravenous Cardizem for atrial fibrillation as well as IV fluids and intravenous antibiotics for sepsis. Overall sepsis had improved, but she developed worsening respiratory failure due to volume overload. She has since been started on intravenous Lasix. She is slowly improving.   Assessment & Plan:   Active Problems:   Atrial fibrillation with rapid ventricular response (HCC)   Abnormal liver function   Dyspnea   Sepsis secondary to UTI [facility])   Renal calculus, right   Thrombocytopenia (HCC)   Acute respiratory failure with hypoxia (HCC)   Ascending aortic aneurysm (HCC)   Demand ischemia (HCC)   1. Sepsis related to urinary tract infection. Currently on intravenous antibiotics. She's been afebrile and clinically is feeling better. She has persistent leukocytosis. Blood cultures are showing no growth. Urine culture showed multiple bacteria. Since she is clinically improving, will continue current treatments. Hemodynamics are stable.  2. Acute respiratory failure with hypoxia. Patient does not report using any oxygen at home. Respiratory failure may be related to volume overload due to aggressive hydration the setting of sepsis. She has been started on intravenous Lasix. Continue to monitor urine output. Wean off oxygen as tolerated  3. Right renal calculus. No signs of obstruction at this time. Urology has reviewed her case and has recommended outpatient urology follow-up in the next 2 weeks.  4. Thrombocytopenia . related to sepsis. Slowly improving.  5. Atrial fibrillation with rapid  ventricular response. Currently on digoxin. Previously required Cardizem infusion, but this has since been discontinued. She has converted to sinus rhythm. We'll change to oral diltiazem. If platelets continue to trend up, she'll need to be started on anticoagulation.  6. Ascending aortic aneurysm. Incidental finding on CT chest. She will need outpatient follow-up for surveillance.  7. Demand ischemia. Elevated troponin noted on admission felt to be related to demand ischemia in the setting of sepsis. Echocardiogram does not show any wall motion abnormalities. No plans for inpatient ischemic workup.  8. Hyperlipidemia. Continue statin   DVT prophylaxis: scd Code Status: full code Family Communication: no family present Disposition Plan: discharge home once improved   Consultants:   Urology (phone)  Procedures:  Echo:- Left ventricle: The cavity size was normal. Wall thickness was   normal. Systolic function was normal. The estimated ejection   fraction was 60%. Wall motion was normal; there were no regional   wall motion abnormalities. Doppler parameters are consistent with   abnormal left ventricular relaxation (grade 1 diastolic   dysfunction). - Aortic valve: There was mild regurgitation. - Mitral valve: Mildly calcified annulus. Mildly thickened leaflets   . There was mild to moderate regurgitation. - Left atrium: The atrium was moderately dilated. - Right atrium: The atrium was mildly dilated. - Tricuspid valve: There was moderate regurgitation. - Pulmonary arteries: PA peak pressure: 54 mm Hg (S). - Inferior vena cava: The vessel was dilated. The respirophasic   diameter changes were blunted (< 50%), consistent with elevated    central venous pressure. Estimated CVP 15 mmHg.  Antimicrobials:   Vancomycin 4/30>>  Rocephin 4/30>>   Subjective: Feeling better today. Shortness of  breath improving. Abdominal pain is better  Objective: Vitals:   08/22/16 0500  08/22/16 0600 08/22/16 0700 08/22/16 0714  BP: 114/70 113/67 124/68   Pulse: 74 69 74 73  Resp: (!) Temp:    97.8 F (36.6 C)  TempSrc:    Oral  SpO2: 98% 100% 98% 98%  Weight:      Height:        Intake/Output Summary (Last 24 hours) at 08/22/16 1032 Last data filed at 08/22/16 0859  Gross per 24 hour  Intake              560 ml  Output             1250 ml  Net             -690 ml   Filed Weights   08/20/16 1431 08/21/16 0500  Weight: 65.8 kg (145 lb) 71.8 kg (158 lb 4.6 oz)    Examination:  General exam: Appears calm and comfortable  Respiratory system: Crackles at bases. Mild increase in Respiratory effort. Cardiovascular system: S1 & S2 heard, RRR. No JVD, murmurs, rubs, gallops or clicks. No pedal edema. Gastrointestinal system: Abdomen is nondistended, soft and nontender. No organomegaly or masses felt. Normal bowel sounds heard. Central nervous system: Alert and oriented. No focal neurological deficits. Extremities: Symmetric 5 x 5 power. Skin: No rashes, lesions or ulcers Psychiatry: Judgement and insight appear normal. Mood & affect appropriate.     Data Reviewed: I have personally reviewed following labs and imaging studies  CBC:  Recent Labs Lab 08/19/16 1842 08/20/16 1437 08/20/16 1443 08/21/16 0652 08/22/16 0514  WBC 3.4* 2.4*  --  18.4* 20.0*  NEUTROABS 3.1 1.8  --   --   --   HGB 16.9* 14.9 15.3* 13.3 13.6  HCT 50.9* 45.8 45.0 40.9 40.6  MCV 92.2 93.1  --  93.4 93.1  PLT 101* 79*  --  67* 76*   Basic Metabolic Panel:  Recent Labs Lab 08/19/16 1842 08/20/16 1437 08/20/16 1443 08/21/16 0652 08/22/16 0514  NA 141 140 140 135 140  K 3.4* 3.8 3.7 4.0 3.4*  CL 99* 100* 99* 103 102  CO2 29 26  --  23 28  GLUCOSE 101* 98 96 205* 158*  BUN 22* 22* 24* 27* 28*  CREATININE 1.17* 1.27* 1.30* 1.12* 0.85  CALCIUM 9.9 8.7*  --  7.6* 8.2*   GFR: Estimated Creatinine Clearance: 45.2 mL/min (by C-G formula based on SCr of 0.85  mg/dL). Liver Function Tests:  Recent Labs Lab 08/19/16 1842 08/20/16 1437 08/21/16 0652  AST 67* 51* 39  ALT 38 40 37  ALKPHOS 79 121 51  BILITOT 1.6* 1.0 0.7  PROT 7.8 7.1 6.2*  ALBUMIN 4.5 3.9 3.2*    Recent Labs Lab 08/19/16 1842  LIPASE 18   No results for input(s): AMMONIA in the last 168 hours. Coagulation Profile: No results for input(s): INR, PROTIME in the last 168 hours. Cardiac Enzymes:  Recent Labs Lab 08/20/16 1937 08/21/16 0039 08/21/16 0653  TROPONINI 0.38* 0.27* 0.18*   BNP (last 3 results) No results for input(s): PROBNP in the last 8760 hours. HbA1C: No results for input(s): HGBA1C in the last 72 hours. CBG: No results for input(s): GLUCAP in the last 168 hours. Lipid Profile: No results for input(s): CHOL, HDL, LDLCALC, TRIG, CHOLHDL, LDLDIRECT in the last 72 hours. Thyroid Function Tests: No results for input(s): TSH, T4TOTAL, FREET4, T3FREE, THYROIDAB in the  last 72 hours. Anemia Panel: No results for input(s): VITAMINB12, FOLATE, FERRITIN, TIBC, IRON, RETICCTPCT in the last 72 hours. Sepsis Labs:  Recent Labs Lab 08/20/16 1641 08/20/16 1937 08/21/16 0814 08/21/16 1146  LATICACIDVEN 3.1* 2.7* 2.1* 3.7*    Recent Results (from the past 240 hour(s))  Urine culture     Status: Abnormal   Collection Time: 08/19/16  6:05 PM  Result Value Ref Range Status   Specimen Description URINE, RANDOM  Final   Special Requests NONE  Final   Culture MULTIPLE SPECIES PRESENT, SUGGEST RECOLLECTION (A)  Final   Report Status 08/21/2016 FINAL  Final  Blood Culture (routine x 2)     Status: None (Preliminary result)   Collection Time: 08/20/16  3:10 PM  Result Value Ref Range Status   Specimen Description LEFT ANTECUBITAL  Final   Special Requests   Final    BOTTLES DRAWN AEROBIC AND ANAEROBIC Blood Culture adequate volume   Culture NO GROWTH 2 DAYS  Final   Report Status PENDING  Incomplete  Blood Culture (routine x 2)     Status: None  (Preliminary result)   Collection Time: 08/20/16  3:16 PM  Result Value Ref Range Status   Specimen Description BLOOD LEFT ARM  Final   Special Requests   Final    BOTTLES DRAWN AEROBIC AND ANAEROBIC Blood Culture adequate volume   Culture NO GROWTH 2 DAYS  Final   Report Status PENDING  Incomplete  MRSA PCR Screening     Status: None   Collection Time: 08/20/16  7:13 PM  Result Value Ref Range Status   MRSA by PCR NEGATIVE NEGATIVE Final    Comment:        The GeneXpert MRSA Assay (FDA approved for NASAL specimens only), is one component of a comprehensive MRSA colonization surveillance program. It is not intended to diagnose MRSA infection nor to guide or monitor treatment for MRSA infections.          Radiology Studies: Ct Angio Chest Pe W Or Wo Contrast  Result Date: 08/20/2016 CLINICAL DATA:  Worsening dyspnea for 2 days.  Inpatient. EXAM: CT ANGIOGRAPHY CHEST WITH CONTRAST TECHNIQUE: Multidetector CT imaging of the chest was performed using the standard protocol during bolus administration of intravenous contrast. Multiplanar CT image reconstructions and MIPs were obtained to evaluate the vascular anatomy. CONTRAST:  75 cc Isovue 370 IV. COMPARISON:  Chest radiograph from earlier today. FINDINGS: Motion degraded scan. Cardiovascular: The study is moderate quality for the evaluation of pulmonary embolism, with evaluation of the segmental and subsegmental pulmonary arteries limited by motion artifact. There are no filling defects in the central, lobar, segmental or subsegmental pulmonary artery branches to suggest acute pulmonary embolism. Atherosclerotic thoracic aorta. Aneurysmal 4.6 cm ascending thoracic aorta. Normal caliber pulmonary arteries. Normal heart size. No significant pericardial fluid/thickening. Left anterior descending and left circumflex coronary atherosclerosis. Mediastinum/Nodes: No discrete thyroid nodules. Unremarkable esophagus. No pathologically enlarged  axillary, mediastinal or hilar lymph nodes. Lungs/Pleura: No pneumothorax. No pleural effusion. Moderate atelectasis in the medial/ dependent bilateral lower lobes. Otherwise no acute consolidative airspace disease, lung masses or significant pulmonary nodules. Upper abdomen: Moderate hiatal hernia. Contrast reflux into the IVC and hepatic veins. Musculoskeletal: No aggressive appearing focal osseous lesions. Marked thoracic spondylosis. Review of the MIP images confirms the above findings. IMPRESSION: 1. Motion degraded scan.  No evidence of pulmonary embolism. 2. Moderate bibasilar atelectasis. 3. Contrast reflux into the IVC and hepatic veins, suggesting elevated central venous pressures/ right heart  failure. 4. Aortic atherosclerosis. Ascending thoracic aortic aneurysm measuring 4.6 cm in maximum diameter. Ascending thoracic aortic aneurysm. Recommend semi-annual imaging followup by CTA or MRA and referral to cardiothoracic surgery if not already obtained. This recommendation follows 2010 ACCF/AHA/AATS/ACR/ASA/SCA/SCAI/SIR/STS/SVM Guidelines for the Diagnosis and Management of Patients With Thoracic Aortic Disease. Circulation. 2010; 121: Z610-R604. 5. Two vessel coronary atherosclerosis. 6. Moderate hiatal hernia. Electronically Signed   By: Delbert Phenix M.D.   On: 08/20/2016 21:21   US Renal  Result Date: 08/21/2016 CLINICAL DATA:  Sepsis, left kidney cyst. EXAM: RENAL / URINARY TRACT ULTRASOUND COMPLETE COMPARISON:  CT 08/19/2016 FINDINGS: Right Kidney: Length: 10.5 cm. Echogenicity within normal limits. No mass or hydronephrosis visualized. 11 mm renal pelvic stone as seen on prior CT. Left Kidney: Length: 10.8 cm. 2.4 cm cyst in the lower pole. Normal echotexture. No hydronephrosis. Bladder: Appears normal for degree of bladder distention. IMPRESSION: No hydronephrosis.  11 mm right renal pelvic stone. 2.4 cm left lower pole renal cyst. Electronically Signed   By: Charlett Nose M.D.   On: 08/21/2016 12:06    Dg Chest Port 1 View  Result Date: 08/21/2016 CLINICAL DATA:  83 year old female with shortness of Breath for 3 days. EXAM: PORTABLE CHEST 1 VIEW COMPARISON:  CTA chest and portable chest radiograph 08/20/2016 FINDINGS: Portable AP upright view at 0814 hours. Continued low lung volumes. Increased pulmonary vascular congestion since yesterday. No pneumothorax. No definite pleural effusion. Continued bibasilar atelectasis. Stable cardiac size and mediastinal contours. Calcified aortic atherosclerosis. IMPRESSION: 1. Worsening pulmonary vascular congestion since yesterday, now suggestive of mild interstitial edema. 2. Continued low lung volumes with atelectasis. No definite pleural effusion. Electronically Signed   By: Odessa Fleming M.D.   On: 08/21/2016 08:31   Dg Chest Portable 1 View  Result Date: 08/20/2016 CLINICAL DATA:  Shortness of breath EXAM: PORTABLE CHEST 1 VIEW COMPARISON:  None FINDINGS: The heart size and mediastinal contours are within normal limits. Aortic atherosclerosis. Decreased lung volumes. Both lungs are clear. The visualized skeletal structures are unremarkable. IMPRESSION: 1. Low lung volumes. 2.  Aortic Atherosclerosis (ICD10-I70.0). Electronically Signed   By: Signa Kell M.D.   On: 08/20/2016 14:48   US Abdomen Limited Ruq  Result Date: 08/21/2016 CLINICAL DATA:  Abnormal LFTs. EXAM: US ABDOMEN LIMITED - RIGHT UPPER QUADRANT COMPARISON:  CT scan 08/19/2016 FINDINGS: Gallbladder: No gallstones or gallbladder wall thickening. No pericholecystic fluid. The sonographer reports no sonographic Murphy's sign. Common bile duct: Diameter: 5 mm Liver: Increased echogenicity with decreased through transmission of liver parenchyma. No focal abnormality identified. IMPRESSION: No sonographic findings to explain the patient's history of abnormal LFTs. Electronically Signed   By: Kennith Center M.D.   On: 08/21/2016 12:02        Scheduled Meds: . digoxin  0.125 mg Oral Daily  . gabapentin   300 mg Oral QHS  . pantoprazole  40 mg Oral Daily  . pravastatin  40 mg Oral QHS  . timolol  1 drop Both Eyes Daily   Continuous Infusions: . cefTRIAXone (ROCEPHIN)  IV Stopped (08/21/16 1603)  . diltiazem (CARDIZEM) infusion Stopped (08/21/16 0920)  . vancomycin Stopped (08/22/16 0700)     LOS: 2 days    Time spent:    Yocelin Vanlue, MD [facility] Pager [phone]  If 7PM-7AM, please contact night-coverage [email or web address] Password [facility] 08/22/2016, 10:32 AM   Addendum 17:15:  Notified by staff the patient is back in atrial fibrillation with a heart rate of 130s to 140s. She was  started on oral Cardizem this morning. Blood pressures and low 100s. Will start back on Cardizem infusion for now. Continue to monitor closely.  Ilithyia Titzer

## 2016-08-23 DIAGNOSIS — J9601 Acute respiratory failure with hypoxia: Secondary | ICD-10-CM

## 2016-08-23 DIAGNOSIS — N390 Urinary tract infection, site not specified: Secondary | ICD-10-CM

## 2016-08-23 DIAGNOSIS — A419 Sepsis, unspecified organism: Principal | ICD-10-CM

## 2016-08-23 DIAGNOSIS — J810 Acute pulmonary edema: Secondary | ICD-10-CM

## 2016-08-23 LAB — CBC
HCT: 43.6 % (ref 36.0–46.0)
Hemoglobin: 14.4 g/dL (ref 12.0–15.0)
MCH: 31 pg (ref 26.0–34.0)
MCHC: 33 g/dL (ref 30.0–36.0)
MCV: 93.8 fL (ref 78.0–100.0)
PLATELETS: 82 10*3/uL — AB (ref 150–400)
RBC: 4.65 MIL/uL (ref 3.87–5.11)
RDW: 13.5 % (ref 11.5–15.5)
WBC: 11.1 10*3/uL — AB (ref 4.0–10.5)

## 2016-08-23 LAB — BASIC METABOLIC PANEL
ANION GAP: 8 (ref 5–15)
BUN: 24 mg/dL — AB (ref 6–20)
CO2: 34 mmol/L — AB (ref 22–32)
CREATININE: 0.81 mg/dL (ref 0.44–1.00)
Calcium: 8.4 mg/dL — ABNORMAL LOW (ref 8.9–10.3)
Chloride: 99 mmol/L — ABNORMAL LOW (ref 101–111)
GFR calc Af Amer: >60 mL/min (ref >60)
GFR calc non Af Amer: >60 mL/min (ref >60)
GLUCOSE: 111 mg/dL — AB (ref 65–99)
Potassium: 3.1 mmol/L — ABNORMAL LOW (ref 3.5–5.1)
Sodium: 141 mmol/L (ref 135–145)

## 2016-08-23 MED ORDER — DILTIAZEM HCL 30 MG PO TABS
30.0000 mg | ORAL_TABLET | Freq: Three times a day (TID) | ORAL | Status: DC
  Administered 2016-08-23 – 2016-08-24 (×3): 30 mg via ORAL
  Filled 2016-08-23 (×3): qty 1

## 2016-08-23 MED ORDER — FUROSEMIDE 10 MG/ML IJ SOLN
40.0000 mg | Freq: Once | INTRAMUSCULAR | Status: AC
  Administered 2016-08-23: 40 mg via INTRAVENOUS
  Filled 2016-08-23: qty 4

## 2016-08-23 MED ORDER — POTASSIUM CHLORIDE CRYS ER 20 MEQ PO TBCR
40.0000 meq | EXTENDED_RELEASE_TABLET | ORAL | Status: AC
  Administered 2016-08-23 (×2): 40 meq via ORAL
  Filled 2016-08-23 (×2): qty 2

## 2016-08-23 NOTE — Progress Notes (Signed)
Progress Note  Patient Name: [name] Date of Encounter: 08/23/2016  Primary Cardiologist: New - Dr. Purvis Sheffield  Subjective   Went back into atrial fibrillation yesterday afternoon with conversion to NSR hours later. Reported feeling "warm" at that time. Denies any recent chest pain or palpitations. Breathing not at baseline.   Inpatient Medications    Scheduled Meds: . digoxin  0.125 mg Oral Daily  . diltiazem  30 mg Oral BID  . gabapentin  300 mg Oral QHS  . pantoprazole  40 mg Oral Daily  . pravastatin  40 mg Oral QHS  . timolol  1 drop Both Eyes Daily   Continuous Infusions: . cefTRIAXone (ROCEPHIN)  IV Stopped (08/22/16 1742)  . diltiazem (CARDIZEM) infusion Stopped (08/23/16 0500)   PRN Meds: levalbuterol, LORazepam   Vital Signs    Vitals:   08/23/16 0640 08/23/16 0650 08/23/16 0700 08/23/16 0708  BP:   122/68   Pulse: 67 67 67 74  Resp: 14 19 15  (!) 21  Temp:    98 F (36.7 C)  TempSrc:    Oral  SpO2: 99% 99% 100% 99%  Weight:      Height:        Intake/Output Summary (Last 24 hours) at 08/23/16 0751 Last data filed at 08/23/16 0500  Gross per 24 hour  Intake              549 ml  Output                0 ml  Net              549 ml   Filed Weights   08/20/16 1431 08/21/16 0500 08/23/16 0500  Weight: 145 lb (65.8 kg) 158 lb 4.6 oz (71.8 kg) 150 lb 2.1 oz (68.1 kg)    Telemetry    Conversion from atrial fibrillation to NSR around 1930 on 5/2. Maintaining NSR with HR in 70's since. - Personally Reviewed  ECG    No new tracings.   Physical Exam   General: Well developed, well nourished elderly Caucasian female appearing in no acute distress. Head: Normocephalic, atraumatic.  Neck: Supple without bruits, JVD not elevated. Lungs:  Resp regular and unlabored, rales at bases bilaterally. Heart: RRR, S1, S2, no S3, S4, or murmur; no rub. Abdomen: Soft, non-tender, non-distended with normoactive bowel sounds. No hepatomegaly. No  rebound/guarding. No obvious abdominal masses. Extremities: No clubbing, cyanosis, or edema. Distal pedal pulses are 2+ bilaterally. Neuro: Alert and oriented X 3. Moves all extremities spontaneously. Psych: Normal affect.  Labs    Chemistry Recent Labs Lab 08/19/16 1842 08/20/16 1437  08/21/16 0652 08/22/16 0514 08/23/16 0516  NA 141 140  < > 135 140 141  K 3.4* 3.8  < > 4.0 3.4* 3.1*  CL 99* 100*  < > 103 102 99*  CO2 29 26  --  23 28 34*  GLUCOSE 101* 98  < > 205* 158* 111*  BUN 22* 22*  < > 27* 28* 24*  CREATININE 1.17* 1.27*  < > 1.12* 0.85 0.81  CALCIUM 9.9 8.7*  --  7.6* 8.2* 8.4*  PROT 7.8 7.1  --  6.2*  --   --   ALBUMIN 4.5 3.9  --  3.2*  --   --   AST 67* 51*  --  39  --   --   ALT 38 40  --  37  --   --   ALKPHOS 79 121  --  51  --   --   BILITOT 1.6* 1.0  --  0.7  --   --   GFRNONAA 41* 37*  --  44* >60 >60  GFRAA 48* 43*  --  50* >60 >60  ANIONGAP 13 14  --  9 10 8   < > = values in this interval not displayed.   Hematology Recent Labs Lab 08/21/16 0652 08/22/16 0514 08/23/16 0516  WBC 18.4* 20.0* 11.1*  RBC 4.38 4.36 4.65  HGB 13.3 13.6 14.4  HCT 40.9 40.6 43.6  MCV 93.4 93.1 93.8  MCH 30.4 31.2 31.0  MCHC 32.5 33.5 33.0  RDW 13.5 13.4 13.5  PLT 67* 76* 82*    Cardiac Enzymes Recent Labs Lab 08/20/16 1937 08/21/16 0039 08/21/16 0653  TROPONINI 0.38* 0.27* 0.18*    Recent Labs Lab 08/20/16 1441  TROPIPOC 0.15*     BNP Recent Labs Lab 08/20/16 1437  BNP 514.0*     DDimer  Recent Labs Lab 08/20/16 1456  DDIMER >20.00*     Radiology    [place] Renal  Result Date: 08/21/2016 CLINICAL DATA:  Sepsis, left kidney cyst. EXAM: RENAL / URINARY TRACT ULTRASOUND COMPLETE COMPARISON:  CT 08/19/2016 FINDINGS: Right Kidney: Length: 10.5 cm. Echogenicity within normal limits. No mass or hydronephrosis visualized. 11 mm renal pelvic stone as seen on prior CT. Left Kidney: Length: 10.8 cm. 2.4 cm cyst in the lower pole. Normal echotexture. No  hydronephrosis. Bladder: Appears normal for degree of bladder distention. IMPRESSION: No hydronephrosis.  11 mm right renal pelvic stone. 2.4 cm left lower pole renal cyst. Electronically Signed   By: Charlett NoseKevin  Dover M.D.   On: 08/21/2016 12:06   Dg Chest Port 1 View  Result Date: 08/21/2016 CLINICAL DATA:  83 year old female with shortness of Breath for 3 days. EXAM: PORTABLE CHEST 1 VIEW COMPARISON:  CTA chest and portable chest radiograph 08/20/2016 FINDINGS: Portable AP upright view at 0814 hours. Continued low lung volumes. Increased pulmonary vascular congestion since yesterday. No pneumothorax. No definite pleural effusion. Continued bibasilar atelectasis. Stable cardiac size and mediastinal contours. Calcified aortic atherosclerosis. IMPRESSION: 1. Worsening pulmonary vascular congestion since yesterday, now suggestive of mild interstitial edema. 2. Continued low lung volumes with atelectasis. No definite pleural effusion. Electronically Signed   By: Odessa FlemingH  Hall M.D.   On: 08/21/2016 08:31   [place] Abdomen Limited Ruq  Result Date: 08/21/2016 CLINICAL DATA:  Abnormal LFTs. EXAM: US ABDOMEN LIMITED - RIGHT UPPER QUADRANT COMPARISON:  CT scan 08/19/2016 FINDINGS: Gallbladder: No gallstones or gallbladder wall thickening. No pericholecystic fluid. The sonographer reports no sonographic Murphy's sign. Common bile duct: Diameter: 5 mm Liver: Increased echogenicity with decreased through transmission of liver parenchyma. No focal abnormality identified. IMPRESSION: No sonographic findings to explain the patient's history of abnormal LFTs. Electronically Signed   By: Kennith CenterEric  Mansell M.D.   On: 08/21/2016 12:02    Cardiac Studies   Echocardiogram: 08/21/2016 Study Conclusions  - Left ventricle: The cavity size was normal. Wall thickness was   normal. Systolic function was normal. The estimated ejection   fraction was 60%. Wall motion was normal; there were no regional   wall motion abnormalities. Doppler  parameters are consistent with   abnormal left ventricular relaxation (grade 1 diastolic   dysfunction). - Aortic valve: There was mild regurgitation. - Mitral valve: Mildly calcified annulus. Mildly thickened leaflets   . There was mild to moderate regurgitation. - Left atrium: The atrium was moderately dilated. - Right atrium: The atrium was  mildly dilated. - Tricuspid valve: There was moderate regurgitation. - Pulmonary arteries: PA peak pressure: 54 mm Hg (S). - Inferior vena cava: The vessel was dilated. The respirophasic   diameter changes were blunted (< 50%), consistent with elevated   central venous pressure. Estimated CVP 15 mmHg.  Patient Profile     83 y.o. female with PMH of HTN and no prior cardiac history who presented to [facility] 08/20[facility]2016 for RLQ pain and dyspnea. Diagnosed with Urosepsis. Cardiology consulted for new-onset atrial fibrillation.   Assessment & Plan    1. New Onset Atrial Fibrillation - admitted with Urosepsis and noted to be in atrial fibrillation. Has experienced conversion to NSR but back in atrial fibrillation on 5/2. Has again converted to NSR with HR currently in the 70's. - echo shows a preserved EF with a moderately dilated LA.  - This patients CHA2DS2-VASc Score and unadjusted Ischemic Stroke Rate (% per year) is equal to 4.8 % stroke rate/year from a score of 4 (HTN, Female, Age (2)). Recommend initiation of Eliquis once thrombocytopenia improves.  - with improvement of BP, can stop Digoxin (would avoid if possible given her advanced age) and start Cardizem 30mg  Q8H. Switch to long-acting Cardizem CD prior to discharge if BP remains stable.   2. Elevated Troponin  - cyclic troponin values peaked at 0.38, trending down to 0.18 on the last check. Likely secondary to demand ischemia in the setting of Urosepsis.   - CTA on admission does mention 2-vessel coronary atherosclerosis but she denies any recent anginal symptoms.  - echo shows a  preserved EF of 60% with no regional WMA. Consider a Lexiscan Myoview as an outpatient for further ischemic evaluation.   3. Urosepsis - remains on IV antibiotics. - per admitting team  4. Pulmonary edema/acute hypoxic respiratory failure - CXR on 5/1 showed pulmonary vascular congestion with mild interstitial edema.  - she still has rales on examination. Requiring O2 supplementation. Will give additional IV Lasix 40mg  this AM.   5. Mitral Regurgitation - echo this admission shows mild to moderate MR. - continue to follow as an outpatient.   6. Thrombocytopenia - improving. Trough of 67K on 5/1, at 82K this AM.   7. Ascending thoracic aortic aneurysm - CTA showed an ascending thoracic aortic aneurysm measuring 4.6 cm in maximum diameter.   - Will need to follow up with CT surgery as outpatient.   Signed, Ellsworth Lennox , PA-C 7:51 AM 08/23/2016 Pager: [phone]  The patient was seen and examined, and I agree with the history, physical exam, assessment and plan as documented above, with modifications as noted below. She went back into atrial fibrillation last night and converted back to sinus rhythm with IV diltiazem. She feels well and said she slept well. Still has some faint rales on exam.  Will start oral diltiazem today and eventually apixaban once platelet count stabilizes. Will give an additional dose of IV Lasix.   Prentice Docker, MD, [facility]  08/23/2016 9:07 AM

## 2016-08-23 NOTE — Care Management Note (Addendum)
Case Management Note  Patient Details  Name: [name] MRN: [id number] Date of Birth: 06/25/1928  Subjective/Objective:    Adm with Afib with RVR. From home, ind PTA. Off and on Cardizem drip. Currently on HFNC, no oxygen PTA.                 Action/Plan: CM following for needs.   ADDENDUM: [facility]. Patient agreeable. Offered choice of HH agencies. Al[facility]ail[facility] will [facility] from chart. Patient does not have a qualifying diagnosis for oxygen and will need to be weaned off prior to discharge.   Expected Discharge Date:     08/25/2016             Expected Discharge Plan:     In-House Referral:     Discharge planning Services  CM Consult  Post Acute Care Choice:    Choice offered to:     DME Arranged:    DME Agency:     HH Arranged:    HH Agency:     Status of Service:  In process, will continue to follow  If discussed at Long Length of Stay Meetings, dates discussed:    Additional Comments:  [name], [name], RN 08/23/2016, 2:12 PM

## 2016-08-23 NOTE — Evaluation (Signed)
Physical Therapy Evaluation Patient Details Name: [name] MRN: [id number] DOB: Jul 23, 1928 Today's Date: 08/23/2016   History of Present Illness  83 y.o. female, w hypertension,  Apparently c/o dyspnea this am and started to get worse.  Denies fever, chills, cough, cp, palp, n/v,  brbpr, black stool.   Pt presented to ED, for dyspnea.  In ED, pt was found to be in Afib, w RVR, at 170  , trop 0.38.  Cardiology is aware and will consult in am per ED.  CTA chest negative for PE>  Pt will be admitted for Afib with RVR and hypotension, and sepsis due to UTI.      Clinical Impression  Pt received sitting up in the chair and was agreeable to PT evaluation.  Pt states that she is normally independent with community ambulation, ADL's, and IADL's.  During PT evaluation she required min guard, and she ambulated 233ft with RW due to feeling slightly unsteady.  Pt's HR and SpO2 remained WNL during ambulation.  She is recommended to have HHPT and possibly a Rollator walker upon d/c.      Follow Up Recommendations Home health PT    Equipment Recommendations  Other (comment) (Rollator walker)    Recommendations for Other Services       Precautions / Restrictions Precautions Precautions: None Restrictions Weight Bearing Restrictions: No      Mobility  Bed Mobility               General bed mobility comments: Not observed. Pt already sitting up in the chair upon arrival.   Transfers Overall transfer level: Needs assistance Equipment used: Rolling walker (2 wheeled) Transfers: Sit to/from Stand Sit to Stand: Min guard            Ambulation/Gait Ambulation/Gait assistance: Supervision;Min guard Ambulation Distance (Feet): 200 Feet Assistive device: Rolling walker (2 wheeled)       General Gait Details: Pt expressed feeling mildly unsteady and wished to have something to hold to.  HR and SpO2 remained WNL during ambulation.   Stairs            Wheelchair Mobility     Modified Rankin (Stroke Patients Only)       Balance Overall balance assessment: No apparent balance deficits (not formally assessed)                                           Pertinent Vitals/Pain Pain Assessment: No/denies pain    Home Living   Living Arrangements: Alone Available Help at Discharge: Family;Neighbor (son and dtr & neighbor are all in and out several times per day.  [place]  [place] Type of Home: House Home Access: Stairs to enter   [employer] of Steps: 3 at the back door with HR Home Layout: One level (one step up going to the front hall. ) Home Equipment: Dan Humphreys - 2 wheels;Cane - single point;Shower seat Additional Comments: Pt states she has access to these pieces of equipment between her and her sister.     Prior Function Level of Independence: Independent         Comments: Hasn't driven since November 2017 since cataract surgery.  Son and neighbor assist with running errands.       Hand Dominance   Dominant Hand: Right    Extremity/Trunk Assessment   Upper Extremity Assessment Upper Extremity  Assessment: Overall WFL for tasks assessed    Lower Extremity Assessment Lower Extremity Assessment: Generalized weakness       Communication   Communication: No difficulties  Cognition Arousal/Alertness: Awake/alert Behavior During Therapy: WFL for tasks assessed/performed Overall Cognitive Status: Within Functional Limits for tasks assessed                                        General Comments      Exercises     Assessment/Plan    PT Assessment Patient needs continued PT services  PT Problem List Decreased strength;Decreased activity tolerance;Decreased mobility;Cardiopulmonary status limiting activity       PT Treatment Interventions DME instruction;Gait training;Functional mobility training;Therapeutic activities;Therapeutic exercise    PT Goals (Current goals can be  found in the Care Plan section)  Acute Rehab PT Goals Patient Stated Goal: To go home PT Goal Formulation: With patient Time For Goal Achievement: 09/06/16 Potential to Achieve Goals: Good    Frequency Min 2X/week   Barriers to discharge Decreased caregiver support Pt lives alone    Co-evaluation               AM-PAC PT "6 Clicks" Daily Activity  Outcome Measure Difficulty turning over in bed (including adjusting bedclothes, sheets and blankets)?: None Difficulty moving from lying on back to sitting on the side of the bed? : None Difficulty sitting down on and standing up from a chair with arms (e.g., wheelchair, bedside commode, etc,.)?: A Little Help needed moving to and from a bed to chair (including a wheelchair)?: A Little Help needed walking in hospital room?: A Little Help needed climbing 3-5 steps with a railing? : A Little 6 Click Score: 20    End of Session Equipment Utilized During Treatment: Gait belt;Oxygen Activity Tolerance: Patient tolerated treatment well Patient left: in chair;with call bell/phone within reach Nurse Communication: Mobility status PT Visit Diagnosis: Other abnormalities of gait and mobility (R26.89);Muscle weakness (generalized) (M62.81)    Time: 1410-1433 PT Time Calculation (min) (ACUTE ONLY): 23 min   Charges:   PT Evaluation $PT Eval Low Complexity: 1 Procedure PT Treatments $Gait Training: 8-22 mins   PT G Codes:   PT G-Codes **NOT FOR INPATIENT CLASS** Functional Assessment Tool Used: AM-PAC 6 Clicks Basic Mobility;Clinical judgement Functional Limitation: Mobility: Walking and moving around Mobility: Walking and Moving Around Current Status ([id number](G8978): At least 20 percent but less than 40 percent impaired, limited or restricted Mobility: Walking and Moving Around Goal Status [phone](G8979): At least 1 percent but less than 20 percent impaired, limited or restricted    Beth Dontarious Schaum, PT, DPT X: [phone]

## 2016-08-23 NOTE — Progress Notes (Signed)
PROGRESS NOTE    [name]  AOZ:[id number] DOB: 01/29/1929 DOA: 08/20/2016 PCP: [name], MD   Brief Narrative:  83 year old female with a history of hypertension, presents to the hospital with complaints of dyspnea. She was found to be in sepsis which was felt to be related to urinary tract infection. She also had rapid atrial fibrillation and acute respiratory failure. She was started on intravenous Cardizem for atrial fibrillation as well as IV fluids and intravenous antibiotics for sepsis. Overall sepsis is improving, but she developed worsening respiratory failure due to volume overload. She has since been started on intravenous Lasix. She is slowly improving.   Assessment & Plan:   Active Problems:   Atrial fibrillation with rapid ventricular response (HCC)   Abnormal liver function   Dyspnea   Sepsis secondary to UTI [facility](HCC)   Renal calculus, right   Thrombocytopenia (HCC)   Acute respiratory failure with hypoxia (HCC)   Ascending aortic aneurysm (HCC)   Demand ischemia (HCC)   1. Sepsis related to urinary tract infection. Currently on intravenous rocephin. Vancomycin discontinued on 5/2. She's been afebrile and clinically is feeling better. Leukocytosis is improving. Blood cultures are showing no growth. Urine culture showed multiple bacteria. Since she is clinically improving, will continue current treatments. Hemodynamics are stable.  2. Acute respiratory failure with hypoxia. Patient does not report using any oxygen at home. Respiratory failure may be related to volume overload due to aggressive hydration the setting of sepsis. She has been receiving intermittent doses of Lasix. Another dose has been ordered for today. Intake and output has not been accurately recorded. Wean off oxygen as tolerated  3. Right renal calculus. No signs of obstruction at this time. Urology has reviewed her case and has recommended outpatient urology follow-up in the next 2 weeks.  4.  Thrombocytopenia . related to sepsis. Slowly improving.  5. Atrial fibrillation with rapid ventricular response. Currently on digoxin. Went back into rapid atrial fibrillation yesterday and had to be put back onto cardizem infusion. She has since converted back to sinus rhythm and cardizem infusion was discontinued at about 4am. Oral cardizem dose is being adjusted. Continue to monitor. If platelets continue to trend up, she'll need to be started on anticoagulation.  6. Ascending aortic aneurysm. Incidental finding on CT chest. She will need outpatient follow-up for surveillance.  7. Demand ischemia. Elevated troponin noted on admission felt to be related to demand ischemia in the setting of sepsis. Echocardiogram does not show any wall motion abnormalities. No plans for inpatient ischemic workup.  8. Hyperlipidemia. Continue statin   DVT prophylaxis: scd Code Status: full code Family Communication: no family present Disposition Plan: discharge home once improved   Consultants:   Urology (phone)  cardiology  Procedures:  Echo:- Left ventricle: The cavity size was normal. Wall thickness was   normal. Systolic function was normal. The estimated ejection   fraction was 60%. Wall motion was normal; there were no regional   wall motion abnormalities. Doppler parameters are consistent with   abnormal left ventricular relaxation (grade 1 diastolic   dysfunction). - Aortic valve: There was mild regurgitation. - Mitral valve: Mildly calcified annulus. Mildly thickened leaflets   . There was mild to moderate regurgitation. - Left atrium: The atrium was moderately dilated. - Right atrium: The atrium was mildly dilated. - Tricuspid valve: There was moderate regurgitation. - Pulmonary arteries: PA peak pressure: 54 mm Hg (S). - Inferior vena cava: The vessel was dilated. The respirophasic  diameter changes were blunted (< 50%), consistent with elevated    central venous pressure.  Estimated CVP 15 mmHg.  Antimicrobials:   Vancomycin 4/30>>5/2  Rocephin 4/30>>   Subjective: Slept well last night. Shortness of breath improving.  Objective: Vitals:   08/23/16 0640 08/23/16 0650 08/23/16 0700 08/23/16 0708  BP:   122/68   Pulse: 67 67 67 74  Resp: 14 19 15  (!) 21  Temp:    98 F (36.7 C)  TempSrc:    Oral  SpO2: 99% 99% 100% 99%  Weight:      Height:        Intake/Output Summary (Last 24 hours) at 08/23/16 0839 Last data filed at 08/23/16 0500  Gross per 24 hour  Intake              399 ml  Output                0 ml  Net              399 ml   Filed Weights   08/20/16 1431 08/21/16 0500 08/23/16 0500  Weight: 65.8 kg (145 lb) 71.8 kg (158 lb 4.6 oz) 68.1 kg (150 lb 2.1 oz)    Examination:  General exam: Alert, awake, oriented x 3 Respiratory system: Crackles at bases. Respiratory effort normal. Cardiovascular system:RRR. No murmurs, rubs, gallops. Gastrointestinal system: Abdomen is nondistended, soft and nontender. No organomegaly or masses felt. Normal bowel sounds heard. Central nervous system: Alert and oriented. No focal neurological deficits. Extremities: No C/C/E, +pedal pulses Skin: No rashes, lesions or ulcers Psychiatry: Judgement and insight appear normal. Mood & affect appropriate.   Data Reviewed: I have personally reviewed following labs and imaging studies  CBC:  Recent Labs Lab 08/19/16 1842 08/20/16 1437 08/20/16 1443 08/21/16 0652 08/22/16 0514 08/23/16 0516  WBC 3.4* 2.4*  --  18.4* 20.0* 11.1*  NEUTROABS 3.1 1.8  --   --   --   --   HGB 16.9* 14.9 15.3* 13.3 13.6 14.4  HCT 50.9* 45.8 45.0 40.9 40.6 43.6  MCV 92.2 93.1  --  93.4 93.1 93.8  PLT 101* 79*  --  67* 76* 82*   Basic Metabolic Panel:  Recent Labs Lab 08/19/16 1842 08/20/16 1437 08/20/16 1443 08/21/16 0652 08/22/16 0514 08/23/16 0516  NA 141 140 140 135 140 141  K 3.4* 3.8 3.7 4.0 3.4* 3.1*  CL 99* 100* 99* 103 102 99*  CO2 29 26  --  23  28 34*  GLUCOSE 101* 98 96 205* 158* 111*  BUN 22* 22* 24* 27* 28* 24*  CREATININE 1.17* 1.27* 1.30* 1.12* 0.85 0.81  CALCIUM 9.9 8.7*  --  7.6* 8.2* 8.4*   GFR: Estimated Creatinine Clearance: 46.2 mL/min (by C-G formula based on SCr of 0.81 mg/dL). Liver Function Tests:  Recent Labs Lab 08/19/16 1842 08/20/16 1437 08/21/16 0652  AST 67* 51* 39  ALT 38 40 37  ALKPHOS 79 121 51  BILITOT 1.6* 1.0 0.7  PROT 7.8 7.1 6.2*  ALBUMIN 4.5 3.9 3.2*    Recent Labs Lab 08/19/16 1842  LIPASE 18   No results for input(s): AMMONIA in the last 168 hours. Coagulation Profile: No results for input(s): INR, PROTIME in the last 168 hours. Cardiac Enzymes:  Recent Labs Lab 08/20/16 1937 08/21/16 0039 08/21/16 0653  TROPONINI 0.38* 0.27* 0.18*   BNP (last 3 results) No results for input(s): PROBNP in the last 8760 hours. HbA1C: No results for  input(s): HGBA1C in the last 72 hours. CBG: No results for input(s): GLUCAP in the last 168 hours. Lipid Profile: No results for input(s): CHOL, HDL, LDLCALC, TRIG, CHOLHDL, LDLDIRECT in the last 72 hours. Thyroid Function Tests: No results for input(s): TSH, T4TOTAL, FREET4, T3FREE, THYROIDAB in the last 72 hours. Anemia Panel: No results for input(s): VITAMINB12, FOLATE, FERRITIN, TIBC, IRON, RETICCTPCT in the last 72 hours. Sepsis Labs:  Recent Labs Lab 08/20/16 1641 08/20/16 1937 08/21/16 0814 08/21/16 1146  LATICACIDVEN 3.1* 2.7* 2.1* 3.7*    Recent Results (from the past 240 hour(s))  Urine culture     Status: Abnormal   Collection Time: 08/19/16  6:05 PM  Result Value Ref Range Status   Specimen Description URINE, RANDOM  Final   Special Requests NONE  Final   Culture MULTIPLE SPECIES PRESENT, SUGGEST RECOLLECTION (A)  Final   Report Status 08/21/2016 FINAL  Final  Blood Culture (routine x 2)     Status: None (Preliminary result)   Collection Time: 08/20/16  3:10 PM  Result Value Ref Range Status   Specimen Description  LEFT ANTECUBITAL  Final   Special Requests   Final    BOTTLES DRAWN AEROBIC AND ANAEROBIC Blood Culture adequate volume   Culture NO GROWTH 3 DAYS  Final   Report Status PENDING  Incomplete  Blood Culture (routine x 2)     Status: None (Preliminary result)   Collection Time: 08/20/16  3:16 PM  Result Value Ref Range Status   Specimen Description BLOOD LEFT ARM  Final   Special Requests   Final    BOTTLES DRAWN AEROBIC AND ANAEROBIC Blood Culture adequate volume   Culture NO GROWTH 3 DAYS  Final   Report Status PENDING  Incomplete  MRSA PCR Screening     Status: None   Collection Time: 08/20/16  7:13 PM  Result Value Ref Range Status   MRSA by PCR NEGATIVE NEGATIVE Final    Comment:        The GeneXpert MRSA Assay (FDA approved for NASAL specimens only), is one component of a comprehensive MRSA colonization surveillance program. It is not intended to diagnose MRSA infection nor to guide or monitor treatment for MRSA infections.          Radiology Studies: US Renal  Result Date: 08/21/2016 CLINICAL DATA:  Sepsis, left kidney cyst. EXAM: RENAL / URINARY TRACT ULTRASOUND COMPLETE COMPARISON:  CT 08/19/2016 FINDINGS: Right Kidney: Length: 10.5 cm. Echogenicity within normal limits. No mass or hydronephrosis visualized. 11 mm renal pelvic stone as seen on prior CT. Left Kidney: Length: 10.8 cm. 2.4 cm cyst in the lower pole. Normal echotexture. No hydronephrosis. Bladder: Appears normal for degree of bladder distention. IMPRESSION: No hydronephrosis.  11 mm right renal pelvic stone. 2.4 cm left lower pole renal cyst. Electronically Signed   By: Charlett Nose M.D.   On: 08/21/2016 12:06   US Abdomen Limited Ruq  Result Date: 08/21/2016 CLINICAL DATA:  Abnormal LFTs. EXAM: US ABDOMEN LIMITED - RIGHT UPPER QUADRANT COMPARISON:  CT scan 08/19/2016 FINDINGS: Gallbladder: No gallstones or gallbladder wall thickening. No pericholecystic fluid. The sonographer reports no sonographic Murphy's  sign. Common bile duct: Diameter: 5 mm Liver: Increased echogenicity with decreased through transmission of liver parenchyma. No focal abnormality identified. IMPRESSION: No sonographic findings to explain the patient's history of abnormal LFTs. Electronically Signed   By: Kennith Center M.D.   On: 08/21/2016 12:02        Scheduled Meds: . diltiazem  30 mg Oral Q8H  . furosemide  40 mg Intravenous Once  . gabapentin  300 mg Oral QHS  . pantoprazole  40 mg Oral Daily  . pravastatin  40 mg Oral QHS  . timolol  1 drop Both Eyes Daily   Continuous Infusions: . cefTRIAXone (ROCEPHIN)  IV Stopped (08/22/16 1742)     LOS: 3 days    Time spent:    Muneer Leider, MD [facility] Pager [phone]  If 7PM-7AM, please contact night-coverage [email or web address] Password [facility] 08/23/2016, 8:39 AM

## 2016-08-23 NOTE — Progress Notes (Signed)
Patient has remained in NSR all day. All vital signs stable.  Pt has had frequent, small-medium sized bowel movements all day.  Patient has been in chair since this AM and worked with PT.  Will continue to monitor   [name] D Kaitlyn Kupfer, RN

## 2016-08-24 ENCOUNTER — Inpatient Hospital Stay (HOSPITAL_COMMUNITY): Payer: PPO

## 2016-08-24 DIAGNOSIS — I5031 Acute diastolic (congestive) heart failure: Secondary | ICD-10-CM | POA: Diagnosis present

## 2016-08-24 DIAGNOSIS — I340 Nonrheumatic mitral (valve) insufficiency: Secondary | ICD-10-CM

## 2016-08-24 LAB — BASIC METABOLIC PANEL
Anion gap: 7 (ref 5–15)
BUN: 19 mg/dL (ref 6–20)
CALCIUM: 8.5 mg/dL — AB (ref 8.9–10.3)
CO2: 31 mmol/L (ref 22–32)
CREATININE: 0.67 mg/dL (ref 0.44–1.00)
Chloride: 101 mmol/L (ref 101–111)
GFR calc Af Amer: >60 mL/min (ref >60)
GFR calc non Af Amer: >60 mL/min (ref >60)
Glucose, Bld: 121 mg/dL — ABNORMAL HIGH (ref 65–99)
POTASSIUM: 3.8 mmol/L (ref 3.5–5.1)
SODIUM: 139 mmol/L (ref 135–145)

## 2016-08-24 LAB — CBC
HCT: 44.3 % (ref 36.0–46.0)
Hemoglobin: 14.6 g/dL (ref 12.0–15.0)
MCH: 30.5 pg (ref 26.0–34.0)
MCHC: 33 g/dL (ref 30.0–36.0)
MCV: 92.7 fL (ref 78.0–100.0)
PLATELETS: 92 10*3/uL — AB (ref 150–400)
RBC: 4.78 MIL/uL (ref 3.87–5.11)
RDW: 13.1 % (ref 11.5–15.5)
WBC: 7.9 10*3/uL (ref 4.0–10.5)

## 2016-08-24 LAB — TSH: TSH: 2.377 u[IU]/mL (ref 0.350–4.500)

## 2016-08-24 LAB — MAGNESIUM: MAGNESIUM: 1.5 mg/dL — AB (ref 1.7–2.4)

## 2016-08-24 MED ORDER — SALINE SPRAY 0.65 % NA SOLN
1.0000 | NASAL | Status: DC | PRN
  Administered 2016-08-24: 1 via NASAL
  Filled 2016-08-24: qty 44

## 2016-08-24 MED ORDER — DILTIAZEM HCL 30 MG PO TABS: 30.0000 mg | ORAL_TABLET | Freq: Three times a day (TID) | ORAL | Status: DC

## 2016-08-24 MED ORDER — MAGNESIUM SULFATE 2 GM/50ML IV SOLN
2.0000 g | Freq: Once | INTRAVENOUS | Status: AC
  Administered 2016-08-24: 2 g via INTRAVENOUS
  Filled 2016-08-24: qty 50

## 2016-08-24 MED ORDER — FUROSEMIDE 10 MG/ML IJ SOLN
40.0000 mg | Freq: Once | INTRAMUSCULAR | Status: AC
  Administered 2016-08-24: 40 mg via INTRAVENOUS
  Filled 2016-08-24: qty 4

## 2016-08-24 MED ORDER — DILTIAZEM HCL ER COATED BEADS 120 MG PO CP24
120.0000 mg | ORAL_CAPSULE | Freq: Every day | ORAL | Status: DC
  Administered 2016-08-24 – 2016-08-25 (×2): 120 mg via ORAL
  Filled 2016-08-24 (×2): qty 1

## 2016-08-24 MED ORDER — DILTIAZEM HCL ER COATED BEADS 120 MG PO CP24: 120.0000 mg | ORAL_CAPSULE | Freq: Every day | ORAL | Status: DC

## 2016-08-24 MED ORDER — APIXABAN 5 MG PO TABS
5.0000 mg | ORAL_TABLET | Freq: Two times a day (BID) | ORAL | Status: DC
  Administered 2016-08-24 – 2016-08-25 (×3): 5 mg via ORAL
  Filled 2016-08-24 (×3): qty 1

## 2016-08-24 NOTE — Progress Notes (Signed)
ANTICOAGULATION CONSULT NOTE - Initial Consult  Pharmacy Consult for Eliquis Indication: atrial fibrillation  Allergies  Allergen Reactions  . Sulfa Antibiotics Other (See Comments)    Pt states that this med makes her feel crazy.      Patient Measurements: Height: 5\' 3"  (160 cm) Weight: 146 lb 6.2 oz (66.4 kg) IBW/kg (Calculated) : 52.4  Vital Signs: Temp: 98.4 F (36.9 C) (05/04 0800) Temp Source: Oral (05/04 0800) BP: 119/64 (05/04 0600) Pulse Rate: 82 (05/04 0600)  Labs:  Recent Labs  08/22/16 0514 08/23/16 0516 08/24/16 0540  HGB 13.6 14.4 14.6  HCT 40.6 43.6 44.3  PLT 76* 82* 92*  CREATININE 0.85 0.81 0.67    Estimated Creatinine Clearance: 46.2 mL/min (by C-G formula based on SCr of 0.67 mg/dL).   Medical History: Past Medical History:  Diagnosis Date  . Hypertension   . 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 Trigeminal neuralgia     Medications:  Prescriptions Prior to Admission  Medication Sig Dispense Refill Last Dose  . Ascorbic Acid (VITAMIN C) 1000 MG tablet Take 1,000 mg by mouth daily.   08/19/2016 at Unknown time  . aspirin EC 81 MG tablet Take 81 mg by mouth daily.   08/19/2016 at Unknown time  . Calcium Carbonate-Vitamin D (CALCIUM 600+D) 600-400 MG-UNIT tablet Take 1 tablet by mouth daily.   08/19/2016 at Unknown time  . Coenzyme Q10 (COQ10) 100 MG CAPS Take 100 mg by mouth daily.   08/19/2016 at Unknown time  . gabapentin (NEURONTIN) 300 MG capsule Take 300 mg by mouth at bedtime.    Past Week at Unknown time  . Multiple Vitamin (MULTIVITAMIN WITH MINERALS) TABS tablet Take 1 tablet by mouth daily.   08/19/2016 at Unknown time  . omega-3 acid ethyl esters (LOVAZA) 1 g capsule Take 1 g by mouth 2 (two) times daily.   08/19/2016 at Unknown time  . pantoprazole (PROTONIX) 40 MG tablet TAKE ONE TABLET BY MOUTH ONCE DAILY. 30 tablet 5 08/19/2016 at Unknown time  . Plant Sterol Stanol-Pantethine  450-75 MG TABS Take 1 tablet by mouth daily.    08/19/2016 at Unknown time  . pravastatin (PRAVACHOL) 40 MG tablet Take 40 mg by mouth at bedtime.    Past Week at Unknown time  . timolol (TIMOPTIC) 0.5 % ophthalmic solution Place 1 drop into both eyes daily.   08/19/2016 at Unknown time  . triamterene-hydrochlorothiazide (MAXZIDE-25) 37.5-25 MG per tablet Take 1 tablet by mouth daily.   08/19/2016 at Unknown time  . vitamin E 400 UNIT capsule Take 400 Units by mouth daily.   08/19/2016 at Unknown time  . cephALEXin (KEFLEX) 500 MG capsule Take 1 capsule (500 mg total) by mouth 3 (three) times daily. 21 capsule 0   . Multiple Vitamins-Minerals (MULTIVITAMIN WITH MINERALS) tablet Take 1 tablet by mouth daily.   08/19/2016 at Unknown time    Assessment: 83 yo female admitted from home with SOB.She was found to be in sepsis which was felt to be related to urinary tract infection. She also had rapid atrial fibrillation and acute respiratory failure. Noted that platelets are trending up. Continue to follow. Asked to initiate eliquis for afib  Goal of Therapy:  Monitor platelets by anticoagulation protocol: Yes   Plan:  Eliquis 5mg  po BID Educate on eliquis Monitor for S/S of bleeding  Elder CyphersLorie Quinten [place], BS Loura BackPharm D, BCPS Clinical Pharmacist Pager [phone] 08/24/2016,10:01 AM

## 2016-08-24 NOTE — Care Management (Signed)
    Durable Medical Equipment        Start     Ordered   08/24/16 [phone]  For home use only DME Walker rolling  Once    Comments:  Needs ROLLATOR walker  Question:  Patient needs a walker to treat with the following condition  Answer:  Atrial fibrillation with rapid ventricular response (HCC)   08/24/16 [id number]

## 2016-08-24 NOTE — Progress Notes (Signed)
PROGRESS NOTE    [name]  ZOX:[id number] DOB: 11/24/1928 DOA: 08/20/2016 PCP: [name], MD   Brief Narrative:  83 year old female with a history of hypertension, presents to the hospital with complaints of dyspnea. She was found to be in sepsis which was felt to be related to urinary tract infection. She also had rapid atrial fibrillation and acute respiratory failure. She was started on intravenous Cardizem for atrial fibrillation as well as IV fluids and intravenous antibiotics for sepsis. Overall sepsis is improving, but she developed worsening respiratory failure due to volume overload. She has since been started on intravenous Lasix. She is slowly improving.   Assessment & Plan:   Active Problems:   Atrial fibrillation with rapid ventricular response (HCC)   Abnormal liver function   Dyspnea   Sepsis secondary to UTI [facility])   Renal calculus, right   Thrombocytopenia (HCC)   Acute respiratory failure with hypoxia (HCC)   Ascending aortic aneurysm (HCC)   Demand ischemia (HCC)   Acute pulmonary edema (HCC)   Acute diastolic CHF (congestive heart failure) (HCC)   1. Sepsis related to urinary tract infection. Currently on intravenous rocephin. Vancomycin discontinued on 5/2. She's been afebrile and clinically is feeling better. Leukocytosis is improving. Blood cultures are showing no growth. Urine culture showed multiple bacteria. Since she is clinically improving, will continue current treatments. Hemodynamics are stable.  2. Acute respiratory failure with hypoxia. Patient does not report using any oxygen at home. Respiratory failure may be related to volume overload due to aggressive hydration the setting of sepsis. She has been receiving intermittent doses of Lasix. Another dose has been ordered for today. Intake and output has not been accurately recorded, but weight trending down. Will repeat chest xray today. Wean off oxygen as tolerated  3. Right renal calculus. No signs  of obstruction at this time. Urology has reviewed her case and has recommended outpatient urology follow-up in the next 2 weeks.  4. Thrombocytopenia . related to sepsis. Slowly improving.  5. Atrial fibrillation with rapid ventricular response. She has intermittently required cardizem infusion, but converted so sinus rhythm on 5/3 and has maintained sinus since then. She is currently on oral cardizem. Will transition to cardizem CD today. Platelet count is improving. Will start on eliquis.  6. Ascending aortic aneurysm. Incidental finding on CT chest. She will need outpatient follow-up for surveillance.  7. Demand ischemia. Elevated troponin noted on admission felt to be related to demand ischemia in the setting of sepsis. Echocardiogram does not show any wall motion abnormalities. No plans for inpatient ischemic workup.  8. Hyperlipidemia. Continue statin  9. Acute diastolic chf. Likely precipitated by aggressive hydration in the setting of sepsis on admission. She has been receiving intermittent doses of lasix. Respiratory status slowly improving. Repeat chest xray today.   DVT prophylaxis: scd Code Status: full code Family Communication: no family present Disposition Plan: discharge home once improved   Consultants:   Urology (phone)  cardiology  Procedures:  Echo:- Left ventricle: The cavity size was normal. Wall thickness was   normal. Systolic function was normal. The estimated ejection   fraction was 60%. Wall motion was normal; there were no regional   wall motion abnormalities. Doppler parameters are consistent with   abnormal left ventricular relaxation (grade 1 diastolic   dysfunction). - Aortic valve: There was mild regurgitation. - Mitral valve: Mildly calcified annulus. Mildly thickened leaflets   . There was mild to moderate regurgitation. - Left atrium: The atrium was  moderately dilated. - Right atrium: The atrium was mildly dilated. - Tricuspid valve: There  was moderate regurgitation. - Pulmonary arteries: PA peak pressure: 54 mm Hg (S). - Inferior vena cava: The vessel was dilated. The respirophasic   diameter changes were blunted (< 50%), consistent with elevated    central venous pressure. Estimated CVP 15 mmHg.  Antimicrobials:   Vancomycin 4/30>>5/2  Rocephin 4/30>>   Subjective: Feeling better. Shortness of breath improving. No chest pain  Objective: Vitals:   08/24/16 0500 08/24/16 0600 08/24/16 0800 08/24/16 0820  BP: 100/62 119/64    Pulse: 78 82    Resp: 16 (!) 26    Temp: 98 F (36.7 C)  98.4 F (36.9 C)   TempSrc: Oral  Oral   SpO2: 94% 98%  96%  Weight:  66.4 kg (146 lb 6.2 oz)    Height:        Intake/Output Summary (Last 24 hours) at 08/24/16 0956 Last data filed at 08/24/16 0930  Gross per 24 hour  Intake              580 ml  Output                0 ml  Net              580 ml   Filed Weights   08/21/16 0500 08/23/16 0500 08/24/16 0600  Weight: 71.8 kg (158 lb 4.6 oz) 68.1 kg (150 lb 2.1 oz) 66.4 kg (146 lb 6.2 oz)    Examination: General exam: Alert, awake, oriented x 3 Respiratory system: crackles at bases. Respiratory effort normal. Cardiovascular system:RRR. No murmurs, rubs, gallops. Trace pedal edema bilaterally Gastrointestinal system: Abdomen is nondistended, soft and nontender. No organomegaly or masses felt. Normal bowel sounds heard. Central nervous system: Alert and oriented. No focal neurological deficits. Extremities: No C/C, +pedal pulses Skin: No rashes, lesions or ulcers Psychiatry: Judgement and insight appear normal. Mood & affect appropriate.    Data Reviewed: I have personally reviewed following labs and imaging studies  CBC:  Recent Labs Lab 08/19/16 1842 08/20/16 1437 08/20/16 1443 08/21/16 0652 08/22/16 0514 08/23/16 0516 08/24/16 0540  WBC 3.4* 2.4*  --  18.4* 20.0* 11.1* 7.9  NEUTROABS 3.1 1.8  --   --   --   --   --   HGB 16.9* 14.9 15.3* 13.3 13.6 14.4 14.6   HCT 50.9* 45.8 45.0 40.9 40.6 43.6 44.3  MCV 92.2 93.1  --  93.4 93.1 93.8 92.7  PLT 101* 79*  --  67* 76* 82* 92*   Basic Metabolic Panel:  Recent Labs Lab 08/20/16 1437 08/20/16 1443 08/21/16 0652 08/22/16 0514 08/23/16 0516 08/24/16 0540  NA 140 140 135 140 141 139  K 3.8 3.7 4.0 3.4* 3.1* 3.8  CL 100* 99* 103 102 99* 101  CO2 26  --  23 28 34* 31  GLUCOSE 98 96 205* 158* 111* 121*  BUN 22* 24* 27* 28* 24* 19  CREATININE 1.27* 1.30* 1.12* 0.85 0.81 0.67  CALCIUM 8.7*  --  7.6* 8.2* 8.4* 8.5*  MG  --   --   --   --   --  1.5*   GFR: Estimated Creatinine Clearance: 46.2 mL/min (by C-G formula based on SCr of 0.67 mg/dL). Liver Function Tests:  Recent Labs Lab 08/19/16 1842 08/20/16 1437 08/21/16 0652  AST 67* 51* 39  ALT 38 40 37  ALKPHOS 79 121 51  BILITOT 1.6* 1.0 0.7  PROT 7.8 7.1 6.2*  ALBUMIN 4.5 3.9 3.2*    Recent Labs Lab 08/19/16 1842  LIPASE 18   No results for input(s): AMMONIA in the last 168 hours. Coagulation Profile: No results for input(s): INR, PROTIME in the last 168 hours. Cardiac Enzymes:  Recent Labs Lab 08/20/16 1937 08/21/16 0039 08/21/16 0653  TROPONINI 0.38* 0.27* 0.18*   BNP (last 3 results) No results for input(s): PROBNP in the last 8760 hours. HbA1C: No results for input(s): HGBA1C in the last 72 hours. CBG: No results for input(s): GLUCAP in the last 168 hours. Lipid Profile: No results for input(s): CHOL, HDL, LDLCALC, TRIG, CHOLHDL, LDLDIRECT in the last 72 hours. Thyroid Function Tests:  Recent Labs  08/24/16 0540  TSH 2.377   Anemia Panel: No results for input(s): VITAMINB12, FOLATE, FERRITIN, TIBC, IRON, RETICCTPCT in the last 72 hours. Sepsis Labs:  Recent Labs Lab 08/20/16 1641 08/20/16 1937 08/21/16 0814 08/21/16 1146  LATICACIDVEN 3.1* 2.7* 2.1* 3.7*    Recent Results (from the past 240 hour(s))  Urine culture     Status: Abnormal   Collection Time: 08/19/16  6:05 PM  Result Value Ref  Range Status   Specimen Description URINE, RANDOM  Final   Special Requests NONE  Final   Culture MULTIPLE SPECIES PRESENT, SUGGEST RECOLLECTION (A)  Final   Report Status 08/21/2016 FINAL  Final  Blood Culture (routine x 2)     Status: None (Preliminary result)   Collection Time: 08/20/16  3:10 PM  Result Value Ref Range Status   Specimen Description LEFT ANTECUBITAL  Final   Special Requests   Final    BOTTLES DRAWN AEROBIC AND ANAEROBIC Blood Culture adequate volume   Culture NO GROWTH 4 DAYS  Final   Report Status PENDING  Incomplete  Blood Culture (routine x 2)     Status: None (Preliminary result)   Collection Time: 08/20/16  3:16 PM  Result Value Ref Range Status   Specimen Description BLOOD LEFT ARM  Final   Special Requests   Final    BOTTLES DRAWN AEROBIC AND ANAEROBIC Blood Culture adequate volume   Culture NO GROWTH 4 DAYS  Final   Report Status PENDING  Incomplete  MRSA PCR Screening     Status: None   Collection Time: 08/20/16  7:13 PM  Result Value Ref Range Status   MRSA by PCR NEGATIVE NEGATIVE Final    Comment:        The GeneXpert MRSA Assay (FDA approved for NASAL specimens only), is one component of a comprehensive MRSA colonization surveillance program. It is not intended to diagnose MRSA infection nor to guide or monitor treatment for MRSA infections.          Radiology Studies: No results found.      Scheduled Meds: . diltiazem  120 mg Oral Daily  . furosemide  40 mg Intravenous Once  . gabapentin  300 mg Oral QHS  . pantoprazole  40 mg Oral Daily  . pravastatin  40 mg Oral QHS  . timolol  1 drop Both Eyes Daily   Continuous Infusions: . cefTRIAXone (ROCEPHIN)  IV Stopped (08/23/16 1555)  . magnesium sulfate 1 - 4 g bolus IVPB 2 g (08/24/16 0938)     LOS: 4 days    Time spent: 25mins    Shamila Lerch, MD [facility] Pager [phone]  If 7PM-7AM, please contact night-coverage [email or web address] Password  [email or web address] 08/24/2016, 9:56 AM

## 2016-08-24 NOTE — Progress Notes (Signed)
Progress Note  Patient Name: [name] Date of Encounter: 08/24/2016  Primary Cardiologist: New - Dr. Purvis Sheffield  Subjective   She denies any chest pain or palpitations. Breathing improved but remains on 6L [place].   Inpatient Medications    Scheduled Meds: . diltiazem  30 mg Oral Q8H  . gabapentin  300 mg Oral QHS  . pantoprazole  40 mg Oral Daily  . pravastatin  40 mg Oral QHS  . timolol  1 drop Both Eyes Daily   Continuous Infusions: . cefTRIAXone (ROCEPHIN)  IV Stopped (08/23/16 1555)   PRN Meds: levalbuterol, LORazepam   Vital Signs    Vitals:   08/24/16 0400 08/24/16 0500 08/24/16 0600 08/24/16 0800  BP: 98/60 100/62 119/64   Pulse: 74 78 82   Resp: 15 16 (!) 26   Temp:  98 F (36.7 C)  98.4 F (36.9 C)  TempSrc:  Oral  Oral  SpO2: 95% 94% 98%   Weight:   146 lb 6.2 oz (66.4 kg)   Height:        Intake/Output Summary (Last 24 hours) at 08/24/16 0816 Last data filed at 08/23/16 1826  Gross per 24 hour  Intake              460 ml  Output                0 ml  Net              460 ml   Filed Weights   08/21/16 0500 08/23/16 0500 08/24/16 0600  Weight: 158 lb 4.6 oz (71.8 kg) 150 lb 2.1 oz (68.1 kg) 146 lb 6.2 oz (66.4 kg)    Telemetry    NSR, HR in 70's - 80's. - Personally Reviewed  ECG    No new tracings.   Physical Exam   General: Well developed, well nourished elderly Caucasian female appearing in no acute distress. Head: Normocephalic, atraumatic.  Neck: Supple without bruits, JVD not elevated. Lungs:  Resp regular and unlabored, no wheezing or rales appreciated. Heart: RRR, S1, S2, no S3, S4, or murmur; no rub. Abdomen: Soft, non-tender, non-distended with normoactive bowel sounds. No hepatomegaly. No rebound/guarding. No obvious abdominal masses. Extremities: No clubbing, cyanosis, or lower extremity edema. Distal pedal pulses are 2+ bilaterally. Neuro: Alert and oriented X 3. Moves all extremities spontaneously. Psych: Normal  affect.  Labs    Chemistry Recent Labs Lab 08/19/16 1842 08/20/16 1437  08/21/16 4098 08/22/16 0514 08/23/16 0516 08/24/16 0540  NA 141 140  < > 135 140 141 139  K 3.4* 3.8  < > 4.0 3.4* 3.1* 3.8  CL 99* 100*  < > 103 102 99* 101  CO2 29 26  --  23 28 34* 31  GLUCOSE 101* 98  < > 205* 158* 111* 121*  BUN 22* 22*  < > 27* 28* 24* 19  CREATININE 1.17* 1.27*  < > 1.12* 0.85 0.81 0.67  CALCIUM 9.9 8.7*  --  7.6* 8.2* 8.4* 8.5*  PROT 7.8 7.1  --  6.2*  --   --   --   ALBUMIN 4.5 3.9  --  3.2*  --   --   --   AST 67* 51*  --  39  --   --   --   ALT 38 40  --  37  --   --   --   ALKPHOS 79 121  --  51  --   --   --  BILITOT 1.6* 1.0  --  0.7  --   --   --   GFRNONAA 41* 37*  --  44* >60 >60 >60  GFRAA 48* 43*  --  50* >60 >60 >60  ANIONGAP 13 14  --  9 10 8 7   < > = values in this interval not displayed.   Hematology  Recent Labs Lab 08/22/16 0514 08/23/16 0516 08/24/16 0540  WBC 20.0* 11.1* 7.9  RBC 4.36 4.65 4.78  HGB 13.6 14.4 14.6  HCT 40.6 43.6 44.3  MCV 93.1 93.8 92.7  MCH 31.2 31.0 30.5  MCHC 33.5 33.0 33.0  RDW 13.4 13.5 13.1  PLT 76* 82* 92*    Cardiac Enzymes  Recent Labs Lab 08/20/16 1937 08/21/16 0039 08/21/16 0653  TROPONINI 0.38* 0.27* 0.18*     Recent Labs Lab 08/20/16 1441  TROPIPOC 0.15*     BNP  Recent Labs Lab 08/20/16 1437  BNP 514.0*     DDimer   Recent Labs Lab 08/20/16 1456  DDIMER >20.00*     Radiology    US Renal  Result Date: 08/21/2016 CLINICAL DATA:  Sepsis, left kidney cyst. EXAM: RENAL / URINARY TRACT ULTRASOUND COMPLETE COMPARISON:  CT 08/19/2016 FINDINGS: Right Kidney: Length: 10.5 cm. Echogenicity within normal limits. No mass or hydronephrosis visualized. 11 mm renal pelvic stone as seen on prior CT. Left Kidney: Length: 10.8 cm. 2.4 cm cyst in the lower pole. Normal echotexture. No hydronephrosis. Bladder: Appears normal for degree of bladder distention. IMPRESSION: No hydronephrosis.  11 mm right renal  pelvic stone. 2.4 cm left lower pole renal cyst. Electronically Signed   By: Charlett Nose M.D.   On: 08/21/2016 12:06   Dg Chest Port 1 View  Result Date: 08/21/2016 CLINICAL DATA:  83 year old female with shortness of Breath for 3 days. EXAM: PORTABLE CHEST 1 VIEW COMPARISON:  CTA chest and portable chest radiograph 08/20/2016 FINDINGS: Portable AP upright view at 0814 hours. Continued low lung volumes. Increased pulmonary vascular congestion since yesterday. No pneumothorax. No definite pleural effusion. Continued bibasilar atelectasis. Stable cardiac size and mediastinal contours. Calcified aortic atherosclerosis. IMPRESSION: 1. Worsening pulmonary vascular congestion since yesterday, now suggestive of mild interstitial edema. 2. Continued low lung volumes with atelectasis. No definite pleural effusion. Electronically Signed   By: Odessa Fleming M.D.   On: 08/21/2016 08:31   US Abdomen Limited Ruq  Result Date: 08/21/2016 CLINICAL DATA:  Abnormal LFTs. EXAM: US ABDOMEN LIMITED - RIGHT UPPER QUADRANT COMPARISON:  CT scan 08/19/2016 FINDINGS: Gallbladder: No gallstones or gallbladder wall thickening. No pericholecystic fluid. The sonographer reports no sonographic Murphy's sign. Common bile duct: Diameter: 5 mm Liver: Increased echogenicity with decreased through transmission of liver parenchyma. No focal abnormality identified. IMPRESSION: No sonographic findings to explain the patient's history of abnormal LFTs. Electronically Signed   By: Kennith Center M.D.   On: 08/21/2016 12:02    Cardiac Studies   Echocardiogram: 08/21/2016 Study Conclusions  - Left ventricle: The cavity size was normal. Wall thickness was   normal. Systolic function was normal. The estimated ejection   fraction was 60%. Wall motion was normal; there were no regional   wall motion abnormalities. Doppler parameters are consistent with   abnormal left ventricular relaxation (grade 1 diastolic   dysfunction). - Aortic valve: There  was mild regurgitation. - Mitral valve: Mildly calcified annulus. Mildly thickened leaflets   . There was mild to moderate regurgitation. - Left atrium: The atrium was moderately dilated. - Right atrium:  The atrium was mildly dilated. - Tricuspid valve: There was moderate regurgitation. - Pulmonary arteries: PA peak pressure: 54 mm Hg (S). - Inferior vena cava: The vessel was dilated. The respirophasic   diameter changes were blunted (< 50%), consistent with elevated   central venous pressure. Estimated CVP 15 mmHg.  Patient Profile     83 y.o. female with PMH of HTN and no prior cardiac history who [facility] for RLQ pain and dyspnea. Diagnosed with Urosepsis. Cardiology consulted for new-onset atrial fibrillation.   Assessment & Plan    1. New Onset Atrial Fibrillation - admitted with Urosepsis and noted to be in atrial fibrillation. Has experienced intermittent episodes of atrial fibrillation this admission with spontaneous conversion back to NSR. Maintained NSR throughout 5/3. - echo shows a preserved EF with a moderately dilated LA. TSH is within normal limits. Mg at 1.5 (will replace).  - This patients CHA2DS2-VASc Score and unadjusted Ischemic Stroke Rate (% per year) is equal to 4.8 % stroke rate/year from a score of 4 (HTN, Female, Age (2)). Recommend initiation of Eliquis 5mg  BID once thrombocytopenia improves.  - with improvement of BP, Digoxin has been discontinued and she has been started on Cardizem 30mg  Q8H. Switch to long-acting Cardizem CD 120mg  daily starting tomorrow.   2. Elevated Troponin  - cyclic troponin values peaked at 0.38, trending down to 0.18 on the last check. Likely secondary to demand ischemia in the setting of Urosepsis.   - CTA on admission does mention 2-vessel coronary atherosclerosis but she denies any recent anginal symptoms.  - echo shows a preserved EF of 60% with no regional WMA. Consider a Lexiscan Myoview as an outpatient  for further ischemic evaluation.   3. Urosepsis - remains on IV antibiotics. WBC improved to 7.9 this AM.  - per admitting team  4. Pulmonary edema/acute hypoxic respiratory failure - CXR on 5/1 showed pulmonary vascular congestion with mild interstitial edema. BNP at 514 on 4/30. - has received IV Lasix intermittently. Weight down from 158 lbs on 5/1 to 146 lbs today. I&O's not accurate secondary to incontinence. Rales no longer present on examination but she is still requiring 6L [place]. Continue to wean O2 as tolerated. Repeat BNP in AM.   5. Mitral Regurgitation - echo this admission shows mild to moderate MR. - continue to follow as an outpatient.   6. Thrombocytopenia - improving. Trough of 67K on 5/1, at 92K this AM.   7. Ascending thoracic aortic aneurysm - CTA showed an ascending thoracic aortic aneurysm measuring 4.6 cm in maximum diameter.   - Will need to follow up with CT surgery as outpatient.  8. Hypomagnesemia - Mg 1.5 this AM. Will replace.   Signed, Ellsworth LennoxBrittany M Strader , PA-C 8:16 AM 08/24/2016 Pager: [phone]  The patient was seen and examined, and I agree with the history, physical exam, assessment and plan as documented above, with modifications as noted below. She is feeling well today and denies chest pain, palpitations, or worsening shortness of breath. Remains in sinus rhythm. On TID oral diltiazem. Would plan to switch to long-acting tomorrow. Would initiate Eliquis once thrombocytopenia is closer to normalization. I will consider an outpatient stress test given demand ischemia seen. She will need outpatient CT surgery follow up for her ascending thoracic aortic aneurysm. Would aim to keep Mg > 2 to increase arrhythmic threshold.    Prentice DockerSuresh Koneswaran, MD, [facility] Alice[facility]ACC  08/24/2016 9:29 AM

## 2016-08-25 LAB — CBC
HCT: 44.5 % (ref 36.0–46.0)
Hemoglobin: 14.7 g/dL (ref 12.0–15.0)
MCH: 30.5 pg (ref 26.0–34.0)
MCHC: 33 g/dL (ref 30.0–36.0)
MCV: 92.3 fL (ref 78.0–100.0)
Platelets: 125 10*3/uL — ABNORMAL LOW (ref 150–400)
RBC: 4.82 MIL/uL (ref 3.87–5.11)
RDW: 12.9 % (ref 11.5–15.5)
WBC: 6.9 10*3/uL (ref 4.0–10.5)

## 2016-08-25 LAB — CULTURE, BLOOD (ROUTINE X 2)
CULTURE: NO GROWTH
Culture: NO GROWTH
SPECIAL REQUESTS: ADEQUATE
Special Requests: ADEQUATE

## 2016-08-25 LAB — BASIC METABOLIC PANEL
ANION GAP: 7 (ref 5–15)
BUN: 16 mg/dL (ref 6–20)
CALCIUM: 8.6 mg/dL — AB (ref 8.9–10.3)
CO2: 30 mmol/L (ref 22–32)
Chloride: 102 mmol/L (ref 101–111)
Creatinine, Ser: 0.62 mg/dL (ref 0.44–1.00)
GLUCOSE: 118 mg/dL — AB (ref 65–99)
Potassium: 3.9 mmol/L (ref 3.5–5.1)
Sodium: 139 mmol/L (ref 135–145)

## 2016-08-25 LAB — BRAIN NATRIURETIC PEPTIDE: B NATRIURETIC PEPTIDE 5: 76 pg/mL (ref 0.0–100.0)

## 2016-08-25 MED ORDER — DILTIAZEM HCL ER COATED BEADS 120 MG PO CP24: 120.0000 mg | ORAL_CAPSULE | Freq: Every day | ORAL | 1 refills | Status: DC

## 2016-08-25 MED ORDER — CIPROFLOXACIN HCL 250 MG PO TABS: 250.0000 mg | ORAL_TABLET | Freq: Two times a day (BID) | ORAL | 0 refills | Status: DC

## 2016-08-25 MED ORDER — APIXABAN 5 MG PO TABS: 5.0000 mg | ORAL_TABLET | Freq: Two times a day (BID) | ORAL | 1 refills | Status: DC

## 2016-08-25 MED ORDER — FUROSEMIDE 20 MG PO TABS: 20.0000 mg | ORAL_TABLET | Freq: Every day | ORAL | 0 refills | Status: DC | PRN

## 2016-08-25 NOTE — Discharge Summary (Addendum)
Physician Discharge Summary  [name] ZOX:[id number] DOB: [birth year] DOA: 08/20/2016  PCP: Benita Stabile, MD  Admit date: 08/20/2016 Discharge date: 08/25/2016  Admitted From: home Disposition:  home  Recommendations for Outpatient Follow-up:  1. Follow up with PCP in 1-2 weeks 2. Please obtain BMP/CBC in one week 3. Follow up with cardiology in 1-2 weeks 4. Follow up with urology in 1-2 weeks  Home Health: HHPT Equipment/Devices:  Discharge Condition: stable CODE STATUS:full code Diet recommendation: Heart Healthy  Brief/Interim Summary: 83 year old female with a history of hypertension, presents to the hospital with complaints of dyspnea. She was found to be in sepsis which was felt to be related to urinary tract infection. She also had rapid atrial fibrillation and acute respiratory failurey  Discharge Diagnoses:  Active Problems:   Atrial fibrillation with rapid ventricular response (HCC)   Abnormal liver function   Dyspnea   Sepsis secondary to UTI [facility])   Renal calculus, right   Thrombocytopenia (HCC)   Acute respiratory failure with hypoxia (HCC)   Ascending aortic aneurysm (HCC)   Demand ischemia (HCC)   Acute pulmonary edema (HCC)   Acute diastolic CHF (congestive heart failure) (HCC)   Hypokalemia   Paroxysmal Atrial fibrillation  1. Sepsis related to urinary tract infection. Treated with intravenous rocephin. Blood culture showed no significant growth. Urine culture showed multiple species. Urine transition her course of Cipro. She is not febrile. Hemodynamics are stable.or today. Intake and output has not been accurately recorded, but weight trending down. Will repeat chest xray today. Wean off oxygen as tolerated  3. Right renal calculus. No signs of obstruction at this time. Urology has reviewed her case and has recommended outpatient urology follow-up in the next 2 weeks.  4. Thrombocytopenia . related to sepsis. Slowly improving.  5. Atrial  fibrillation with rapid ventricular response. She intermittently required cardizem infusion, but converted to sinus rhythm on 5/3 and has maintained sinus since then.  she is now on Cardizem CD and remained stable. Platelet count is improving. Anticoagulated with eliquis  6. Ascending aortic aneurysm. Incidental finding on CT chest. She will need outpatient follow-up for surveillance.  7. Demand ischemia. Elevated troponin noted on admission felt to be related to demand ischemia in the setting of sepsis. Echocardiogram does not show any wall motion abnormalities. No plans for inpatient ischemic workup. Outpatient stress test will be considered on cardiology follow-up.   8. Hyperlipidemia. Continue statin  9.  acute respiratory failure with hypoxia due to Acute diastolic chf. Likely precipitated by aggressive hydration in the setting of sepsis on admission. She was diuresed with intravenous Lasix and volume status has improved. She is now on room air and is able to ambulate without difficulty. Follow-up chest x-ray shows improvement. She'll be discharged on oral Lasix to be used as needed.  Discharge Instructions  Discharge Instructions    AMB Referral to [facility]    Complete by:  As directed    Reason for consult:  Telephonic post hospital discharge follow-up   Expected date of contact:  1-3 days (reserved for hospital discharges)   Please assign patient for telephonic RN case manager  to engage for transition of care calls and evaluate for further [facility] services needs. For questions please contact:   Janci Minor RN, BSN  [facility] Liaison [phone])   Diet - low sodium heart healthy    Complete by:  As directed    Increase activity slowly    Complete by:  As  directed      Allergies as of 08/25/2016      Reactions   Sulfa Antibiotics Other (See Comments)   Pt states that this med makes her feel crazy.        Medication List    STOP taking these medications    cephALEXin 500 MG capsule Commonly known as:  KEFLEX   triamterene-hydrochlorothiazide 37.5-25 MG tablet Commonly known as:  MAXZIDE-25     TAKE these medications   apixaban 5 MG Tabs tablet Commonly known as:  ELIQUIS Take 1 tablet (5 mg total) by mouth 2 (two) times daily.   aspirin EC 81 MG tablet Take 81 mg by mouth daily.   CALCIUM 600+D 600-400 MG-UNIT tablet Generic drug:  Calcium Carbonate-Vitamin D Take 1 tablet by mouth daily.   ciprofloxacin 250 MG tablet Commonly known as:  CIPRO Take 1 tablet (250 mg total) by mouth 2 (two) times daily.   CoQ10 100 MG Caps Take 100 mg by mouth daily.   diltiazem 120 MG 24 hr capsule Commonly known as:  CARDIZEM CD Take 1 capsule (120 mg total) by mouth daily. Start taking on:  08/26/2016   furosemide 20 MG tablet Commonly known as:  LASIX Take 1 tablet (20 mg total) by mouth daily as needed.   gabapentin 300 MG capsule Commonly known as:  NEURONTIN Take 300 mg by mouth at bedtime.   multivitamin with minerals tablet Take 1 tablet by mouth daily.   multivitamin with minerals Tabs tablet Take 1 tablet by mouth daily.   omega-3 acid ethyl esters 1 g capsule Commonly known as:  LOVAZA Take 1 g by mouth 2 (two) times daily.   pantoprazole 40 MG tablet Commonly known as:  PROTONIX TAKE ONE TABLET BY MOUTH ONCE DAILY.   Plant Sterol Stanol-Pantethine 450-75 MG Tabs Take 1 tablet by mouth daily.   pravastatin 40 MG tablet Commonly known as:  PRAVACHOL Take 40 mg by mouth at bedtime.   timolol 0.5 % ophthalmic solution Commonly known as:  TIMOPTIC Place 1 drop into both eyes daily.   vitamin C 1000 MG tablet Take 1,000 mg by mouth daily.   vitamin E 400 UNIT capsule Take 400 Units by mouth daily.            Durable Medical Equipment        Start     Ordered   08/24/16 [phone]  For home use only DME Walker rolling  Once    Comments:  Needs ROLLATOR walker  Question:  Patient needs a walker to treat  with the following condition  Answer:  Atrial fibrillation with rapid ventricular response (HCC)   08/24/16 0917     Follow-up Information    [facility] Follow up.   Why:  Home health PT Contact information: [address] [place] [address] [phone]       Laqueta Linden, MD Follow up.   Specialty:  Cardiology Why:  they will call you with follow up appointment Contact information: [address] [place] [address] [phone]        Crist Fat, MD Follow up.   Specialty:  Urology Why:  call for appointment in 2 weeks Contact information: [address] [place] [address] [phone]        Benita Stabile, MD. Schedule an appointment as soon as possible for a visit in 2 week(s).   Specialty:  Internal Medicine Contact information: [address]  [address] [place] [address] [phone]          Allergies  Allergen Reactions  . Sulfa Antibiotics Other (See Comments)    Pt states that this med makes her feel crazy.      Consultations:  Cardiology  Urology (phone)   Procedures/Studies: Ct Angio Chest Pe W Or Wo Contrast  Result Date: 08/20/2016 CLINICAL DATA:  Worsening dyspnea for 2 days.  Inpatient. EXAM: CT ANGIOGRAPHY CHEST WITH CONTRAST TECHNIQUE: Multidetector CT imaging of the chest was performed using the standard protocol during bolus administration of intravenous contrast. Multiplanar CT image reconstructions and MIPs were obtained to evaluate the vascular anatomy. CONTRAST:  75 cc Isovue 370 IV. COMPARISON:  Chest radiograph from earlier today. FINDINGS: Motion degraded scan. Cardiovascular: The study is moderate quality for the evaluation of pulmonary embolism, with evaluation of the segmental and subsegmental pulmonary arteries limited by motion artifact. There are no filling defects in the central, lobar, segmental or subsegmental pulmonary artery branches to suggest acute pulmonary embolism.  Atherosclerotic thoracic aorta. Aneurysmal 4.6 cm ascending thoracic aorta. Normal caliber pulmonary arteries. Normal heart size. No significant pericardial fluid/thickening. Left anterior descending and left circumflex coronary atherosclerosis. Mediastinum/Nodes: No discrete thyroid nodules. Unremarkable esophagus. No pathologically enlarged axillary, mediastinal or hilar lymph nodes. Lungs/Pleura: No pneumothorax. No pleural effusion. Moderate atelectasis in the medial/ dependent bilateral lower lobes. Otherwise no acute consolidative airspace disease, lung masses or significant pulmonary nodules. Upper abdomen: Moderate hiatal hernia. Contrast reflux into the IVC and hepatic veins. Musculoskeletal: No aggressive appearing focal osseous lesions. Marked thoracic spondylosis. Review of the MIP images confirms the above findings. IMPRESSION: 1. Motion degraded scan.  No evidence of pulmonary embolism. 2. Moderate bibasilar atelectasis. 3. Contrast reflux into the IVC and hepatic veins, suggesting elevated central venous pressures/ right heart failure. 4. Aortic atherosclerosis. Ascending thoracic aortic aneurysm measuring 4.6 cm in maximum diameter. Ascending thoracic aortic aneurysm. Recommend semi-annual imaging followup by CTA or MRA and referral to cardiothoracic surgery if not already obtained. This recommendation follows 2010 ACCF/AHA/AATS/ACR/ASA/SCA/SCAI/SIR/STS/SVM Guidelines for the Diagnosis and Management of Patients With Thoracic Aortic Disease. Circulation. 2010; 121: O130-Q657. 5. Two vessel coronary atherosclerosis. 6. Moderate hiatal hernia. Electronically Signed   By: Delbert Phenix M.D.   On: 08/20/2016 21:21   Ct Abdomen Pelvis W Contrast  Result Date: 08/19/2016 CLINICAL DATA:  Right lower quadrant abdominal pain beginning 6 hours ago. EXAM: CT ABDOMEN AND PELVIS WITH CONTRAST TECHNIQUE: Multidetector CT imaging of the abdomen and pelvis was performed using the standard protocol following bolus  administration of intravenous contrast. CONTRAST:  ISOVUE-300 IOPAMIDOL (ISOVUE-300) INJECTION 61% COMPARISON:  None. FINDINGS: Lower chest: Large hiatal hernia. Coronary artery calcification. No pleural or pericardial fluid. Hepatobiliary: Liver parenchyma is normal.  No calcified gallstones. Pancreas: Normal Spleen: Normal Adrenals/Urinary Tract: Adrenal glands are normal. Left kidney is normal except for a 1.5 cm cyst. Right kidney contains a 1 cm stone in the renal pelvis which could cause ball valve obstruction. No evidence of ureteral stone or bladder stone. Stomach/Bowel: Normal appearing appendix.  No acute bowel pathology. Vascular/Lymphatic: Aortic atherosclerosis. No aneurysm. IVC is normal. No retroperitoneal adenopathy. Reproductive: Normal Other: No free fluid or air. Musculoskeletal: Curvature in chronic degenerative changes of the spine. IMPRESSION: Normal appearing appendix. No acute bowel pathology. No specific cause of right lower quadrant pain identified. 1 cm stone in the right renal pelvis. No obstruction currently, but this could result in ball valve obstruction. Aortic atherosclerosis. Large hiatal hernia. Coronary artery calcification.  Electronically Signed   By: Paulina Fusi M.D.   On: 08/19/2016 20:10   US Renal  Result Date: 08/21/2016 CLINICAL DATA:  Sepsis, left kidney cyst. EXAM: RENAL / URINARY TRACT ULTRASOUND COMPLETE COMPARISON:  CT 08/19/2016 FINDINGS: Right Kidney: Length: 10.5 cm. Echogenicity within normal limits. No mass or hydronephrosis visualized. 11 mm renal pelvic stone as seen on prior CT. Left Kidney: Length: 10.8 cm. 2.4 cm cyst in the lower pole. Normal echotexture. No hydronephrosis. Bladder: Appears normal for degree of bladder distention. IMPRESSION: No hydronephrosis.  11 mm right renal pelvic stone. 2.4 cm left lower pole renal cyst. Electronically Signed   By: Charlett Nose M.D.   On: 08/21/2016 12:06   Dg Chest Port 1 View  Result Date:  08/24/2016 CLINICAL DATA:  Respiratory failure EXAM: PORTABLE CHEST 1 VIEW COMPARISON:  08/21/2016 FINDINGS: Low lung volumes. Linear areas of atelectasis in the lung bases. Heart is mildly enlarged. Tortuosity of the thoracic aorta. IMPRESSION: Low lung volumes, bibasilar atelectasis. Mild cardiomegaly. Electronically Signed   By: Charlett Nose M.D.   On: 08/24/2016 10:19   Dg Chest Port 1 View  Result Date: 08/21/2016 CLINICAL DATA:  83 year old female with shortness of Breath for 3 days. EXAM: PORTABLE CHEST 1 VIEW COMPARISON:  CTA chest and portable chest radiograph 08/20/2016 FINDINGS: Portable AP upright view at 0814 hours. Continued low lung volumes. Increased pulmonary vascular congestion since yesterday. No pneumothorax. No definite pleural effusion. Continued bibasilar atelectasis. Stable cardiac size and mediastinal contours. Calcified aortic atherosclerosis. IMPRESSION: 1. Worsening pulmonary vascular congestion since yesterday, now suggestive of mild interstitial edema. 2. Continued low lung volumes with atelectasis. No definite pleural effusion. Electronically Signed   By: Odessa Fleming M.D.   On: 08/21/2016 08:31   Dg Chest Portable 1 View  Result Date: 08/20/2016 CLINICAL DATA:  Shortness of breath EXAM: PORTABLE CHEST 1 VIEW COMPARISON:  None FINDINGS: The heart size and mediastinal contours are within normal limits. Aortic atherosclerosis. Decreased lung volumes. Both lungs are clear. The visualized skeletal structures are unremarkable. IMPRESSION: 1. Low lung volumes. 2.  Aortic Atherosclerosis (ICD10-I70.0). Electronically Signed   By: Signa Kell M.D.   On: 08/20/2016 14:48   US Abdomen Limited Ruq  Result Date: 08/21/2016 CLINICAL DATA:  Abnormal LFTs. EXAM: US ABDOMEN LIMITED - RIGHT UPPER QUADRANT COMPARISON:  CT scan 08/19/2016 FINDINGS: Gallbladder: No gallstones or gallbladder wall thickening. No pericholecystic fluid. The sonographer reports no sonographic Murphy's sign. Common bile  duct: Diameter: 5 mm Liver: Increased echogenicity with decreased through transmission of liver parenchyma. No focal abnormality identified. IMPRESSION: No sonographic findings to explain the patient's history of abnormal LFTs. Electronically Signed   By: Kennith Center M.D.   On: 08/21/2016 12:02   Echo: - Left ventricle: The cavity size was normal. Wall thickness was   normal. Systolic function was normal. The estimated ejection   fraction was 60%. Wall motion was normal; there were no regional   wall motion abnormalities. Doppler parameters are consistent with   abnormal left ventricular relaxation (grade 1 diastolic   dysfunction). - Aortic valve: There was mild regurgitation. - Mitral valve: Mildly calcified annulus. Mildly thickened leaflets   . There was mild to moderate regurgitation. - Left atrium: The atrium was moderately dilated. - Right atrium: The atrium was mildly dilated. - Tricuspid valve: There was moderate regurgitation. - Pulmonary arteries: PA peak pressure: 54 mm Hg (S). - Inferior vena cava: The vessel was dilated. The respirophasic   diameter changes were  blunted (< 50%), consistent with elevated   central venous pressure. Estimated CVP 15 mmHg.   Subjective: No shortness of breath or chest pain  Discharge Exam: Vitals:   08/25/16 0600 08/25/16 1103  BP: 113/63 116/71  Pulse: 77   Resp: 15   Temp:     Vitals:   08/25/16 0000 08/25/16 0400 08/25/16 0600 08/25/16 1103  BP: 107/64 118/61 113/63 116/71  Pulse: 80 77 77   Resp: 16 (!) 22 15   Temp: 98.6 F (37 C)     TempSrc: Oral     SpO2: 97% 95% (!) 87%   Weight:      Height:        General: Pt is alert, awake, not in acute distress Cardiovascular: RRR, S1/S2 +, no rubs, no gallops Respiratory: CTA bilaterally, no wheezing, no rhonchi Abdominal: Soft, NT, ND, bowel sounds + Extremities: no edema, no cyanosis    The results of significant diagnostics from this hospitalization (including imaging,  microbiology, ancillary and laboratory) are listed below for reference.     Microbiology: Recent Results (from the past 240 hour(s))  Urine culture     Status: Abnormal   Collection Time: 08/19/16  6:05 PM  Result Value Ref Range Status   Specimen Description URINE, RANDOM  Final   Special Requests NONE  Final   Culture MULTIPLE SPECIES PRESENT, SUGGEST RECOLLECTION (A)  Final   Report Status 08/21/2016 FINAL  Final  Blood Culture (routine x 2)     Status: None   Collection Time: 08/20/16  3:10 PM  Result Value Ref Range Status   Specimen Description LEFT ANTECUBITAL  Final   Special Requests   Final    BOTTLES DRAWN AEROBIC AND ANAEROBIC Blood Culture adequate volume   Culture NO GROWTH 5 DAYS  Final   Report Status 08/25/2016 FINAL  Final  Blood Culture (routine x 2)     Status: None   Collection Time: 08/20/16  3:16 PM  Result Value Ref Range Status   Specimen Description BLOOD LEFT ARM  Final   Special Requests   Final    BOTTLES DRAWN AEROBIC AND ANAEROBIC Blood Culture adequate volume   Culture NO GROWTH 5 DAYS  Final   Report Status 08/25/2016 FINAL  Final  MRSA PCR Screening     Status: None   Collection Time: 08/20/16  7:13 PM  Result Value Ref Range Status   MRSA by PCR NEGATIVE NEGATIVE Final    Comment:        The GeneXpert MRSA Assay (FDA approved for NASAL specimens only), is one component of a comprehensive MRSA colonization surveillance program. It is not intended to diagnose MRSA infection nor to guide or monitor treatment for MRSA infections.      Labs: BNP (last 3 results)  Recent Labs  08/20/16 1437 08/25/16 0419  BNP 514.0* 76.0   Basic Metabolic Panel:  Recent Labs Lab 08/21/16 0652 08/22/16 0514 08/23/16 0516 08/24/16 0540 08/25/16 0419  NA 135 140 141 139 139  K 4.0 3.4* 3.1* 3.8 3.9  CL 103 102 99* 101 102  CO2 23 28 34* 31 30  GLUCOSE 205* 158* 111* 121* 118*  BUN 27* 28* 24* 19 16  CREATININE 1.12* 0.85 0.81 0.67 0.62   CALCIUM 7.6* 8.2* 8.4* 8.5* 8.6*  MG  --   --   --  1.5*  --    Liver Function Tests:  Recent Labs Lab 08/19/16 1842 08/20/16 1437 08/21/16 0652  AST 67*  51* 39  ALT 38 40 37  ALKPHOS 79 121 51  BILITOT 1.6* 1.0 0.7  PROT 7.8 7.1 6.2*  ALBUMIN 4.5 3.9 3.2*    Recent Labs Lab 08/19/16 1842  LIPASE 18   No results for input(s): AMMONIA in the last 168 hours. CBC:  Recent Labs Lab 08/19/16 1842 08/20/16 1437  08/21/16 5409 08/22/16 0514 08/23/16 0516 08/24/16 0540 08/25/16 0419  WBC 3.4* 2.4*  --  18.4* 20.0* 11.1* 7.9 6.9  NEUTROABS 3.1 1.8  --   --   --   --   --   --   HGB 16.9* 14.9  < > 13.3 13.6 14.4 14.6 14.7  HCT 50.9* 45.8  < > 40.9 40.6 43.6 44.3 44.5  MCV 92.2 93.1  --  93.4 93.1 93.8 92.7 92.3  PLT 101* 79*  --  67* 76* 82* 92* 125*  < > = values in this interval not displayed. Cardiac Enzymes:  Recent Labs Lab 08/20/16 1937 08/21/16 0039 08/21/16 0653  TROPONINI 0.38* 0.27* 0.18*   BNP: Invalid input(s): POCBNP CBG: No results for input(s): GLUCAP in the last 168 hours. D-Dimer No results for input(s): DDIMER in the last 72 hours. Hgb A1c No results for input(s): HGBA1C in the last 72 hours. Lipid Profile No results for input(s): CHOL, HDL, LDLCALC, TRIG, CHOLHDL, LDLDIRECT in the last 72 hours. Thyroid function studies  Recent Labs  08/24/16 0540  TSH 2.377   Anemia work up No results for input(s): VITAMINB12, FOLATE, FERRITIN, TIBC, IRON, RETICCTPCT in the last 72 hours. Urinalysis    Component Value Date/Time   COLORURINE STRAW (A) 08/22/2016 0530   APPEARANCEUR CLEAR 08/22/2016 0530   LABSPEC 1.008 08/22/2016 0530   PHURINE 5.0 08/22/2016 0530   GLUCOSEU NEGATIVE 08/22/2016 0530   HGBUR SMALL (A) 08/22/2016 0530   BILIRUBINUR NEGATIVE 08/22/2016 0530   KETONESUR NEGATIVE 08/22/2016 0530   PROTEINUR NEGATIVE 08/22/2016 0530   NITRITE NEGATIVE 08/22/2016 0530   LEUKOCYTESUR NEGATIVE 08/22/2016 0530   Sepsis  Labs Invalid input(s): PROCALCITONIN,  WBC,  LACTICIDVEN Microbiology Recent Results (from the past 240 hour(s))  Urine culture     Status: Abnormal   Collection Time: 08/19/16  6:05 PM  Result Value Ref Range Status   Specimen Description URINE, RANDOM  Final   Special Requests NONE  Final   Culture MULTIPLE SPECIES PRESENT, SUGGEST RECOLLECTION (A)  Final   Report Status 08/21/2016 FINAL  Final  Blood Culture (routine x 2)     Status: None   Collection Time: 08/20/16  3:10 PM  Result Value Ref Range Status   Specimen Description LEFT ANTECUBITAL  Final   Special Requests   Final    BOTTLES DRAWN AEROBIC AND ANAEROBIC Blood Culture adequate volume   Culture NO GROWTH 5 DAYS  Final   Report Status 08/25/2016 FINAL  Final  Blood Culture (routine x 2)     Status: None   Collection Time: 08/20/16  3:16 PM  Result Value Ref Range Status   Specimen Description BLOOD LEFT ARM  Final   Special Requests   Final    BOTTLES DRAWN AEROBIC AND ANAEROBIC Blood Culture adequate volume   Culture NO GROWTH 5 DAYS  Final   Report Status 08/25/2016 FINAL  Final  MRSA PCR Screening     Status: None   Collection Time: 08/20/16  7:13 PM  Result Value Ref Range Status   MRSA by PCR NEGATIVE NEGATIVE Final    Comment:  The GeneXpert MRSA Assay (FDA approved for NASAL specimens only), is one component of a comprehensive MRSA colonization surveillance program. It is not intended to diagnose MRSA infection nor to guide or monitor treatment for MRSA infections.      Time coordinating discharge: Over 30 minutes  SIGNED:   Erick Blinks, MD  [facility] 08/25/2016, 3:54 PM Pager   If 7PM-7AM, please contact night-coverage [email or web address] Password [email or web address]

## 2016-08-25 NOTE — ED Provider Notes (Signed)
[facility] Provider Note   CSN: [id number] Arrival date & time: 08/19/16  1741     History   Chief Complaint Chief Complaint  Patient presents with  . Abdominal Pain    HPI [name] is a 83 y.o. female.  HPI   88yF with abdominal pain. RLQ. Onset around 1400. Persistent since then. "just hurts." No appreciable exacerbating or relieving factors. No fever or chills. Increased urinary frequency. No dysuria. No vomiting. Has had diarrhea.   Past Medical History:  Diagnosis Date  . Hypertension   . 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 Trigeminal neuralgia     Patient Active Problem List   Diagnosis Date Noted  . Acute diastolic CHF (congestive heart failure) (HCC) 08/24/2016  . Acute pulmonary edema (HCC)   . Sepsis secondary to UTI (HCC) 08/22/2016  . Renal calculus, right 08/22/2016  . Thrombocytopenia (HCC) 08/22/2016  . Acute respiratory failure with hypoxia (HCC) 08/22/2016  . Ascending aortic aneurysm (HCC) 08/22/2016  . Demand ischemia (HCC) 08/22/2016  . Atrial fibrillation with rapid ventricular response (HCC) 08/20/2016  . Abnormal liver function   . Dyspnea   . URI (upper respiratory infection) 04/29/2014  . 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 Essential hypertension, benign 02/20/2013  . High cholesterol 02/20/2013    Past Surgical History:  Procedure Laterality Date  . BREAST CYST EXCISION  60 yrs ago  . COLONOSCOPY WITH ESOPHAGOGASTRODUODENOSCOPY (EGD) N/A 03/10/2013   Procedure: COLONOSCOPY WITH ESOPHAGOGASTRODUODENOSCOPY (EGD);  Surgeon: Malissa Hippo, MD;  Location[facility];  Service: Endoscopy;  Laterality: N/A;  730  . SLT LASER APPLICATION Left 09/27/2014   Procedure: SLT LASER APPLICATION;  Surgeon: Susa Simmonds, MD;  Location: [facility];  Service: Ophthalmology;  Laterality: Left;    OB History    No data available       Home Medications    Prior to  Admission medications   Medication Sig Start Date End Date Taking? Authorizing Provider  Ascorbic Acid (VITAMIN C) 1000 MG tablet Take 1,000 mg by mouth daily.   Yes [provider]  aspirin EC 81 MG tablet Take 81 mg by mouth daily.   Yes [provider]  Calcium Carbonate-Vitamin D (CALCIUM 600+D) 600-400 MG-UNIT tablet Take 1 tablet by mouth daily.   Yes [provider]  Coenzyme Q10 (COQ10) 100 MG CAPS Take 100 mg by mouth daily.   Yes [provider]  gabapentin (NEURONTIN) 300 MG capsule Take 300 mg by mouth at bedtime.    Yes [provider]  Multiple Vitamin (MULTIVITAMIN WITH MINERALS) TABS tablet Take 1 tablet by mouth daily.   Yes [provider]  Multiple Vitamins-Minerals (MULTIVITAMIN WITH MINERALS) tablet Take 1 tablet by mouth daily.   Yes [provider]  omega-3 acid ethyl esters (LOVAZA) 1 g capsule Take 1 g by mouth 2 (two) times daily.   Yes [provider]  pantoprazole (PROTONIX) 40 MG tablet TAKE ONE TABLET BY MOUTH ONCE DAILY. 03/05/16  Yes Rehman, Joline Maxcy, MD  Plant Sterol Stanol-Pantethine 450-75 MG TABS Take 1 tablet by mouth daily.    Yes [provider]  pravastatin (PRAVACHOL) 40 MG tablet Take 40 mg by mouth at bedtime.    Yes [provider]  timolol (TIMOPTIC) 0.5 % ophthalmic solution Place 1 drop into both eyes daily.   Yes [provider]  triamterene-hydrochlorothiazide (MAXZIDE-25) 37.5-25 MG per tablet Take 1 tablet  by mouth daily.   Yes [provider]  vitamin E 400 UNIT capsule Take 400 Units by mouth daily.   Yes [provider]  apixaban (ELIQUIS) 5 MG TABS tablet Take 1 tablet (5 mg total) by mouth 2 (two) times daily. 08/25/16   Erick Blinks, MD  cephALEXin (KEFLEX) 500 MG capsule Take 1 capsule (500 mg total) by mouth 3 (three) times daily. 08/19/16   Raeford Razor, MD  ciprofloxacin (CIPRO) 250 MG tablet Take 1 tablet (250 mg total)  by mouth 2 (two) times daily. 08/25/16   Erick Blinks, MD  diltiazem (CARDIZEM CD) 120 MG 24 hr capsule Take 1 capsule (120 mg total) by mouth daily. 08/26/16   Erick Blinks, MD  furosemide (LASIX) 20 MG tablet Take 1 tablet (20 mg total) by mouth daily as needed. 08/25/16   Erick Blinks, MD    Family History No family history on file.  Social History Social History  Substance Use Topics  . Smoking status: Never Smoker  . Smokeless tobacco: Never Used  . Alcohol use No     Allergies   Sulfa antibiotics   Review of Systems Review of Systems  All systems reviewed and negative, other than as noted in HPI.   Physical Exam Updated Vital Signs BP 105/68   Pulse (!) 105   Temp (!) 101.6 F (38.7 C) (Rectal)   Resp (!) 28   Ht 5\' 3"  (1.6 m)   Wt 145 lb (65.8 kg)   SpO2 95%   BMI 25.69 kg/m   Physical Exam  Constitutional: She appears well-developed and well-nourished. No distress.  HENT:  Head: Normocephalic and atraumatic.  Eyes: Conjunctivae are normal. Right eye exhibits no discharge. Left eye exhibits no discharge.  Neck: Neck supple.  Cardiovascular: Normal rate, regular rhythm and normal heart sounds.  Exam reveals no gallop and no friction rub.   No murmur heard. Pulmonary/Chest: Effort normal and breath sounds normal. No respiratory distress.  Abdominal: Soft. She exhibits no distension. There is tenderness. There is no guarding.  RLQ/R flank tenderness w/o rebound or guarding.   Musculoskeletal: She exhibits no edema or tenderness.  Neurological: She is alert.  Skin: Skin is warm and dry.  Psychiatric: She has a normal mood and affect. Her behavior is normal. Thought content normal.  Nursing note and vitals reviewed.    ED Treatments / Results  Labs (all labs ordered are listed, but only abnormal results are displayed) Labs Reviewed  URINE CULTURE - Abnormal; Notable for the following:       Result Value   Culture MULTIPLE SPECIES PRESENT, SUGGEST  RECOLLECTION (*)    All other components within normal limits  CBC WITH DIFFERENTIAL/PLATELET - Abnormal; Notable for the following:    WBC 3.4 (*)    RBC 5.52 (*)    Hemoglobin 16.9 (*)    HCT 50.9 (*)    Platelets 101 (*)    Lymphs Abs 0.2 (*)    Monocytes Absolute 0.0 (*)    All other components within normal limits  COMPREHENSIVE METABOLIC PANEL - Abnormal; Notable for the following:    Potassium 3.4 (*)    Chloride 99 (*)    Glucose, Bld 101 (*)    BUN 22 (*)    Creatinine, Ser 1.17 (*)    AST 67 (*)    Total Bilirubin 1.6 (*)    GFR calc non Af Amer 41 (*)    GFR calc Af Amer 48 (*)  All other components within normal limits  URINALYSIS, ROUTINE W REFLEX MICROSCOPIC - Abnormal; Notable for the following:    APPearance HAZY (*)    Hgb urine dipstick MODERATE (*)    Nitrite POSITIVE (*)    Leukocytes, UA LARGE (*)    Bacteria, UA RARE (*)    Squamous Epithelial / LPF 0-5 (*)    Non Squamous Epithelial 0-5 (*)    All other components within normal limits  LACTIC ACID, PLASMA - Abnormal; Notable for the following:    Lactic Acid, Venous 4.7 (*)    All other components within normal limits  LACTIC ACID, PLASMA - Abnormal; Notable for the following:    Lactic Acid, Venous 2.3 (*)    All other components within normal limits  LIPASE, BLOOD    EKG  EKG Interpretation None       Radiology  Ct Abdomen Pelvis W Contrast  Result Date: 08/19/2016 CLINICAL DATA:  Right lower quadrant abdominal pain beginning 6 hours ago. EXAM: CT ABDOMEN AND PELVIS WITH CONTRAST TECHNIQUE: Multidetector CT imaging of the abdomen and pelvis was performed using the standard protocol following bolus administration of intravenous contrast. CONTRAST:  100mL ISOVUE-300 IOPAMIDOL (ISOVUE-300) INJECTION 61% COMPARISON:  None. FINDINGS: Lower chest: Large hiatal hernia. Coronary artery calcification. No pleural or pericardial fluid. Hepatobiliary: Liver parenchyma is normal.  No calcified  gallstones. Pancreas: Normal Spleen: Normal Adrenals/Urinary Tract: Adrenal glands are normal. Left kidney is normal except for a 1.5 cm cyst. Right kidney contains a 1 cm stone in the renal pelvis which could cause ball valve obstruction. No evidence of ureteral stone or bladder stone. Stomach/Bowel: Normal appearing appendix.  No acute bowel pathology. Vascular/Lymphatic: Aortic atherosclerosis. No aneurysm. IVC is normal. No retroperitoneal adenopathy. Reproductive: Normal Other: No free fluid or air. Musculoskeletal: Curvature in chronic degenerative changes of the spine. IMPRESSION: Normal appearing appendix. No acute bowel pathology. No specific cause of right lower quadrant pain identified. 1 cm stone in the right renal pelvis. No obstruction currently, but this could result in ball valve obstruction. Aortic atherosclerosis. Large hiatal hernia. Coronary artery calcification. Electronically Signed   By: Paulina FusiMark  Shogry M.D.   On: 08/19/2016 20:10             Procedures Procedures (including critical care time)  Medications Ordered in ED Medications  sodium chloride 0.9 % bolus 1,000 mL (0 mLs Intravenous Stopped 08/19/16 2041)  ondansetron (ZOFRAN) injection 4 mg (4 mg Intravenous Given 08/19/16 1842)  morphine 4 MG/ML injection 4 mg (4 mg Intravenous Given 08/19/16 1842)  acetaminophen (TYLENOL) tablet 1,000 mg (1,000 mg Oral Given 08/19/16 1900)  acetaminophen (TYLENOL) 500 MG tablet (  Given 08/19/16 1900)  cefTRIAXone (ROCEPHIN) 1 g in dextrose 5 % 50 mL IVPB (0 g Intravenous Stopped 08/19/16 2036)  iopamidol (ISOVUE-300) 61 % injection 100 mL (100 mLs Intravenous Contrast Given 08/19/16 1945)  sodium chloride 0.9 % bolus 1,000 mL (0 mLs Intravenous Stopped 08/19/16 2206)     Initial Impression / Assessment and Plan / ED Course  I have reviewed the triage vital signs and the nursing notes.  Pertinent labs & imaging results that were available during my care of the patient were reviewed  by me and considered in my medical decision making (see chart for details).     88yF with abdominal pain. Possible UTI. Symptos now significantly improved. Lactic acid also improved although still a little elevated. Offered admission, but she is declining. Strict return precautions were discussed. Outpt FU  otherwise.   Final Clinical Impressions(s) / ED Diagnoses   Final diagnoses:  Acute cystitis with hematuria  Sepsis, due to unspecified organism [facility])    New Prescriptions Discharge Medication List as of 08/19/2016  9:53 PM    START taking these medications   Details  cephALEXin (KEFLEX) 500 MG capsule Take 1 capsule (500 mg total) by mouth 3 (three) times daily., Starting Sun 08/19/2016, Print         Raeford Razor, MD 08/25/16 1721

## 2016-09-04 ENCOUNTER — Ambulatory Visit (INDEPENDENT_AMBULATORY_CARE_PROVIDER_SITE_OTHER): Payer: PPO | Admitting: Urology

## 2016-09-04 ENCOUNTER — Other Ambulatory Visit: Payer: Self-pay | Admitting: *Deleted

## 2016-09-04 ENCOUNTER — Other Ambulatory Visit: Payer: Self-pay | Admitting: Urology

## 2016-09-04 ENCOUNTER — Other Ambulatory Visit (HOSPITAL_COMMUNITY)
Admission: RE | Admit: 2016-09-04 | Discharge: 2016-09-04 | Disposition: A | Discharge status: Home / Self Care | Payer: PPO | Source: Other Acute Inpatient Hospital | Attending: Urology | Admitting: Urology

## 2016-09-04 ENCOUNTER — Ambulatory Visit (HOSPITAL_COMMUNITY)
Admission: RE | Admit: 2016-09-04 | Discharge: 2016-09-04 | Disposition: A | Discharge status: Home / Self Care | Payer: PPO | Source: Ambulatory Visit | Attending: Urology | Admitting: Urology

## 2016-09-04 DIAGNOSIS — N200 Calculus of kidney: Secondary | ICD-10-CM

## 2016-09-04 NOTE — Patient Outreach (Signed)
Attempted Transition of care call to pt but there was no answer. I left a message and requested a return call.  I also called pt's emergency contact, Kaitlyn SandiferVicky [name], and left a message on her voice mail also.  I will try again tomorrow, Wednesday, May 16th.  [name] [facility] FeGNP-BC [facility] Care Manager [phone]

## 2016-09-05 ENCOUNTER — Ambulatory Visit: Payer: Self-pay | Admitting: *Deleted

## 2016-09-05 ENCOUNTER — Other Ambulatory Visit: Payer: Self-pay | Admitting: *Deleted

## 2016-09-05 NOTE — Patient Outreach (Signed)
Initial transition of care call completed. We chatted for awhile but then a thunderstorm came up and we had to discontinue our call. I told her I would call her again in the morning and she agreed. See Transition of care template.  [name] [facility]NP-BC [facility] Care Manager [phone]

## 2016-09-06 ENCOUNTER — Other Ambulatory Visit: Payer: Self-pay | Admitting: *Deleted

## 2016-09-06 ENCOUNTER — Encounter: Payer: Self-pay | Admitting: *Deleted

## 2016-09-06 NOTE — Patient Outreach (Addendum)
Completed initial assessment. Pt waiting to hear about if cardiology will approve her to have the procedure (lithotripsy) and stop her Eliquis and when the procedure is to be scheduled.  I will follow up in one week. I have encouraged Mrs. Hannula to call me if she has questions or if she would like to relay any pertinent information to me about her upcoming procedure.  Patient was recently discharged from hospital and all medications have been reviewed.  Current Outpatient Prescriptions on File Prior to Visit  Medication Sig Dispense Refill  . apixaban (ELIQUIS) 5 MG TABS tablet Take 1 tablet (5 mg total) by mouth 2 (two) times daily. 60 tablet 1  . Ascorbic Acid (VITAMIN C) 1000 MG tablet Take 1,000 mg by mouth daily.    Marland Kitchen. aspirin EC 81 MG tablet Take 81 mg by mouth daily.    . Calcium Carbonate-Vitamin D (CALCIUM 600+D) 600-400 MG-UNIT tablet Take 1 tablet by mouth daily.    . Coenzyme Q10 (COQ10) 100 MG CAPS Take 100 mg by mouth daily.    Marland Kitchen. diltiazem (CARDIZEM CD) 120 MG 24 hr capsule Take 1 capsule (120 mg total) by mouth daily. 30 capsule 1  . furosemide (LASIX) 20 MG tablet Take 1 tablet (20 mg total) by mouth daily as needed. 30 tablet 0  . gabapentin (NEURONTIN) 300 MG capsule Take 300 mg by mouth at bedtime.     . Multiple Vitamin (MULTIVITAMIN WITH MINERALS) TABS tablet Take 1 tablet by mouth daily.    Marland Kitchen. omega-3 acid ethyl esters (LOVAZA) 1 g capsule Take 1 g by mouth 2 (two) times daily.    . pantoprazole (PROTONIX) 40 MG tablet TAKE ONE TABLET BY MOUTH ONCE DAILY. 30 tablet 5  . Plant Sterol Stanol-Pantethine 450-75 MG TABS Take 1 tablet by mouth daily.     . pravastatin (PRAVACHOL) 40 MG tablet Take 40 mg by mouth at bedtime.     . timolol (TIMOPTIC) 0.5 % ophthalmic solution Place 1 drop into both eyes daily.    . vitamin E 400 UNIT capsule Take 400 Units by mouth daily.    . ciprofloxacin (CIPRO) 250 MG tablet Take 1 tablet (250 mg total) by mouth 2 (two) times daily. 6 tablet  0  . Multiple Vitamins-Minerals (MULTIVITAMIN WITH MINERALS) tablet Take 1 tablet by mouth daily.     No current facility-administered medications on file prior to visit.     [facility]N CM Care Plan Problem One     Most Recent Value  Care Plan Problem One  New diagnosis: kidney stone  Role Documenting the Problem One  Care Management Coordinator  Care Plan for Problem One  Active  THN CM Short Term Goal #1 (0-30 days)  Pt will review Emmi programs on kidney stones, procedure and self care.  THN CM Short Term Goal #1 Start Date  09/06/16  Interventions for Short Term Goal #1  Assign, print and sent Emmi programs.  THN CM Short Term Goal #2 (0-30 days)  Pt will follow instructions to prepare for lithotripsy over the next 2 weeks.Brandon Melnick.  THN CM Short Term Goal #2 Start Date  09/06/16  Interventions for Short Term Goal #2  Provided information about the procedure.    [facility] CM Care Plan Problem Two     Most Recent Value  Care Plan Problem Two  Resolved UTI  Role Documenting the Problem Two  Care Management Coordinator  Care Plan for Problem Two  Active  THN CM Short Term Goal #  1 (0-30 days)  Pt will be educated Environmental manager) for prevention of recurrent UTI.  THN CM Short Term Goal #1 Start Date  09/06/16  Interventions for Short Term Goal #2   Assign Emmi program for UTI prevention.     Fall Risk  09/06/2016 04/29/2014 02/15/2014 02/01/2014  Falls in the past year? No No No No   Depression screen [facility] 2/9 09/06/2016 04/29/2014  Decreased Interest 0 0  Down, Depressed, Hopeless 0 0  PHQ - 2 Score 0 0   Body mass index is 25.69 kg/m.   Almetta Lovely [facility] Care Manager [phone]

## 2016-09-07 LAB — URINE CULTURE

## 2016-09-11 ENCOUNTER — Encounter: Payer: Self-pay | Admitting: Physician Assistant

## 2016-09-11 DIAGNOSIS — R7989 Other specified abnormal findings of blood chemistry: Secondary | ICD-10-CM | POA: Insufficient documentation

## 2016-09-11 DIAGNOSIS — I10 Essential (primary) hypertension: Secondary | ICD-10-CM | POA: Insufficient documentation

## 2016-09-11 DIAGNOSIS — I5032 Chronic diastolic (congestive) heart failure: Secondary | ICD-10-CM | POA: Insufficient documentation

## 2016-09-11 DIAGNOSIS — R778 Other specified abnormalities of plasma proteins: Secondary | ICD-10-CM | POA: Insufficient documentation

## 2016-09-11 DIAGNOSIS — I480 Paroxysmal atrial fibrillation: Secondary | ICD-10-CM | POA: Insufficient documentation

## 2016-09-11 NOTE — Progress Notes (Signed)
Cardiology Office Note    Date:  09/12/2016  ID:  [name], DOB [birth year], MRN [id number] PCP:  Benita Stabile, MD  Cardiologist:  Dr. Purvis Sheffield  Chief Complaint: f/u atrial fib  History of Present Illness:  [name] is a 83 y.o. female with history of HTN, polycythemia, trigeminal neuralgia, and recent admission for sepsis found to have atrial fib, aortic atherosclerosis/4.6cm ascending aortic aneurysm/CAD by CT who presents for follow-up. She was seen in ED 08/19/16 with abdominal pain, found to have possible UTI with elevated lactic acid. Patient was treated with IV fluids and antibiotics but she declined admission. She presented back to ED next day with worsening SOB and felt to have sepsis due to UTI in setting of right renal calculus, paroxysms of rapid atrial fib, acute respiratory failure requiring supplemental O2, thrombocytopenia felt due to sepsis, acute diastolic CHF requiring diuresis, hypomagnesemia, and elevated troponin felt due to demand ischemia. 2D Echo 08/21/16: EF 60%, grade 1 DD, mil AI, mild-mod MR, mod LAE, mildly dilated RA, mod TR, PASP , elevated CVP. CTA neg for PE but did show contrast reflux into the IVC and hepatic veins, suggesting elevated central venous pressures/ right heart failure, aortic atherosclerosis, 4.6cm ascending thoracic aortic anneurysm (semi-annual imaging recommended), 2v coronary atherosclerosis and moderate hiatal hernia. Her afib was treated with cardizem with spontaneous conversion to NSR. She was started on Eliquis. Last labs showed plt improved to 125 (nadir 67), Hgb 14.7, K 3.9, Cr 0.62, glu 118, Mg 1.5, TSH wnl, peak troponin 0.38, AST/ALT wnl.  She returns for appointment today to follow-up her atrial fib, also at the request of Dr. Retta Diones to obtain clearance to come off Eliquis. She says she has more energy now than she did before her recent hospitalization. She describes herself as a Neurosurgeon" and has not had any chest  pain or SOB with remaining active or doing stairs. She does note intermittent hematuria ever since seeing urology. This is not every time she urinates but does happen up to several times a day. Sometimes it's quite dark (not as dark as grape juice), other times it's very minimal. She also has some events where she urinates without any blood at all. No LEE, orthopnea, PND. She started a new antibiotic (Augmentin) Monday night.    Past Medical History:  Diagnosis Date  . Ascending aortic aneurysm (HCC)    a. 4.6cm by CT 07/2016.  Marland Kitchen Chronic diastolic CHF (congestive heart failure) (HCC)   . Coronary artery calcification seen on CT scan 07/2016  . Hypertension   . Hypomagnesemia   . PAF (paroxysmal atrial fibrillation) (HCC)    a. dx during adm 08/2016.  Marland Kitchen 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   Negative Jak2, BCR/ABL, normal epo level on 02/01/2014  . Sepsis (HCC) 08/2016  . Thrombocytopenia (HCC)   . Trigeminal neuralgia     Past Surgical History:  Procedure Laterality Date  . BREAST CYST EXCISION  60 yrs ago  . COLONOSCOPY WITH ESOPHAGOGASTRODUODENOSCOPY (EGD) N/A 03/10/2013   Procedure: COLONOSCOPY WITH ESOPHAGOGASTRODUODENOSCOPY (EGD);  Surgeon: Malissa Hippo, MD;  Location: [facility];  Service: Endoscopy;  Laterality: N/A;  730  . SLT LASER APPLICATION Left 09/27/2014   Procedure: SLT LASER APPLICATION;  Surgeon: Susa Simmonds, MD;  Location: [facility];  Service: Ophthalmology;  Laterality: Left;    Current Medications: Outpatient Medications Prior to Visit  Medication Sig Dispense Refill  . apixaban (ELIQUIS) 5 MG TABS tablet Take 1 tablet (5 mg  total) by mouth 2 (two) times daily. 60 tablet 1  . Ascorbic Acid (VITAMIN C) 1000 MG tablet Take 1,000 mg by mouth daily.    Marland Kitchen. aspirin EC 81 MG tablet Take 81 mg by mouth daily.    . Calcium Carbonate-Vitamin D (CALCIUM 600+D) 600-400 MG-UNIT tablet Take 1 tablet by mouth daily.    . Coenzyme Q10 (COQ10) 100 MG CAPS Take 100 mg by mouth  daily.    Marland Kitchen. diltiazem (CARDIZEM CD) 120 MG 24 hr capsule Take 1 capsule (120 mg total) by mouth daily. 30 capsule 1  . furosemide (LASIX) 20 MG tablet Take 1 tablet (20 mg total) by mouth daily as needed. 30 tablet 0  . gabapentin (NEURONTIN) 300 MG capsule Take 300 mg by mouth at bedtime.     . Multiple Vitamin (MULTIVITAMIN WITH MINERALS) TABS tablet Take 1 tablet by mouth daily.    . Multiple Vitamins-Minerals (MULTIVITAMIN WITH MINERALS) tablet Take 1 tablet by mouth daily.    Marland Kitchen. omega-3 acid ethyl esters (LOVAZA) 1 g capsule Take 1 g by mouth 2 (two) times daily.    . pantoprazole (PROTONIX) 40 MG tablet TAKE ONE TABLET BY MOUTH ONCE DAILY. 30 tablet 5  . Plant Sterol Stanol-Pantethine 450-75 MG TABS Take 1 tablet by mouth daily.     . pravastatin (PRAVACHOL) 40 MG tablet Take 40 mg by mouth at bedtime.     . timolol (TIMOPTIC) 0.5 % ophthalmic solution Place 1 drop into both eyes daily.    . vitamin E 400 UNIT capsule Take 400 Units by mouth daily.    . ciprofloxacin (CIPRO) 250 MG tablet Take 1 tablet (250 mg total) by mouth 2 (two) times daily. (Patient not taking: Reported on 09/12/2016) 6 tablet 0   No facility-administered medications prior to visit.      Allergies:   Sulfa antibiotics   Social History   Social History  . Marital status: Widowed    Spouse name: N/A  . Number of children: N/A  . Years of education: N/A   Social History Main Topics  . Smoking status: Never Smoker  . Smokeless tobacco: Never Used  . Alcohol use No  . Drug use: No  . Sexual activity: No   Other Topics Concern  . None   Social History Narrative  . None     Family History:  Family History  Problem Relation Age of Onset  . CAD Neg Hx     ROS:   Please see the history of present illness.  All other systems are reviewed and otherwise negative.    PHYSICAL EXAM:   VS:  BP 138/78   Pulse 89   Ht 5\' 3"  (1.6 m)   Wt 140 lb (63.5 kg)   SpO2 95%   BMI 24.80 kg/m   BMI: Body mass  index is 24.8 kg/m. GEN: Well nourished, well developed WF, in no acute distress  HEENT: normocephalic, atraumatic Neck: no JVD, carotid bruits, or masses Cardiac: RRR; no murmurs, rubs, or gallops, no edema  Respiratory:  clear to auscultation bilaterally, normal work of breathing GI: soft, nontender, nondistended, + BS MS: no deformity or atrophy  Skin: warm and dry, no rash Neuro:  Alert and Oriented x 3, Strength and sensation are intact, follows commands Psych: euthymic mood, full affect  Wt Readings from Last 3 Encounters:  09/12/16 140 lb (63.5 kg)  09/06/16 145 lb (65.8 kg)  08/24/16 146 lb 6.2 oz (66.4 kg)  Studies/Labs Reviewed:   EKG:  EKG was ordered today and personally reviewed by me and demonstrates NSR 88bpm, nonspecific ST-T changes, nonacute  Recent Labs: 08/21/2016: ALT 37 08/24/2016: Magnesium 1.5; TSH 2.377 08/25/2016: B Natriuretic Peptide 76.0; BUN 16; Creatinine, Ser 0.62; Hemoglobin 14.7; Platelets 125; Potassium 3.9; Sodium 139   Lipid Panel No results found for: CHOL, TRIG, HDL, CHOLHDL, VLDL, LDLCALC, LDLDIRECT  Additional studies/ records that were reviewed today include: Summarized above.    ASSESSMENT & PLAN:   1. Paroxysmal atrial fib - maintaining NSR. Suspect related to underlying infectious issues - however, since she was asymptomatic when her HR was elevated, it is difficult to know if she has had silent AF previous to this admission. CHADSVASC is 5 warranting long-term anticoagulation for stroke prophylaxis. Reviewed with Dr. Purvis Sheffield. Since she had spontaneously converted to NSR and did not require electrical cardioversion, we feel she is OK to interrupt Eliquis for 48 hours prior to lithotripsy as requested. Would restart as soon as safe after procedure. I called and spoke with Dr. Retta Diones regarding her development of hematuria. He does not presently feel we need to hold Eliquis completely for now as her hematuria is likely due to the  renal stone and she will be scheduled for lithotripsy soon. They will take care of scheduling this and informing the patient when to stop her Eliquis. Will check CBC, BMET today to ensure stability. If there is significant anemia present will need to reconsider this. 2. Elevated troponin level/coronary calcification seen on CT - she is completely asymptomatic from a cardiac perspective. Per review with MD, no further ischemic workup warranted at present time. Warning sx reviewed with patient. Given ongoing issues of hematuria and need for Eliquis, will stop aspirin. 3. Thrombocytopenia - suspect this was related to sepsis today, but will recheck today. 4. Chronic diastolic CHF - appears euvolemic. Volume excess have been related to fluid resuscitation in the setting of sepsis. We discussed importance of 2g sodium diet restriction and 64oz fluid restriction (but making sure to try to stay hydrated given recent renal stone issues). Recheck lytes and magnesium given hypomagnesemia in hospital. 5. Ascending aortic aneurysm - consider semi-annual imaging (next due 01/2017), will defer arrangement of monitoring to primary cardiologist.  Disposition: F/u with Dr. Purvis Sheffield in 3 months.   Medication Adjustments/Labs and Tests Ordered: Current medicines are reviewed at length with the patient today.  Concerns regarding medicines are outlined above. Medication changes, Labs and Tests ordered today are summarized above and listed in the Patient Instructions accessible in Encounters.   Signed, Laurann Montana, PA-C  09/12/2016 3:06 PM    [facility] [place] Location in [facility] [address] [place], [place] [address] Ph: [phone]; Fax [phone]

## 2016-09-12 ENCOUNTER — Other Ambulatory Visit (HOSPITAL_COMMUNITY)
Admission: RE | Admit: 2016-09-12 | Discharge: 2016-09-12 | Disposition: A | Discharge status: Home / Self Care | Payer: PPO | Source: Ambulatory Visit | Attending: Physician Assistant | Admitting: Physician Assistant

## 2016-09-12 ENCOUNTER — Encounter: Payer: Self-pay | Admitting: Physician Assistant

## 2016-09-12 ENCOUNTER — Ambulatory Visit (INDEPENDENT_AMBULATORY_CARE_PROVIDER_SITE_OTHER): Payer: PPO | Admitting: Physician Assistant

## 2016-09-12 VITALS — BP 138/78 | HR 89 | Ht 63.0 in | Wt 140.0 lb

## 2016-09-12 DIAGNOSIS — I712 Thoracic aortic aneurysm, without rupture: Secondary | ICD-10-CM | POA: Diagnosis not present

## 2016-09-12 DIAGNOSIS — R7989 Other specified abnormal findings of blood chemistry: Secondary | ICD-10-CM

## 2016-09-12 DIAGNOSIS — I2510 Atherosclerotic heart disease of native coronary artery without angina pectoris: Secondary | ICD-10-CM | POA: Insufficient documentation

## 2016-09-12 DIAGNOSIS — R748 Abnormal levels of other serum enzymes: Secondary | ICD-10-CM | POA: Diagnosis not present

## 2016-09-12 DIAGNOSIS — D696 Thrombocytopenia, unspecified: Secondary | ICD-10-CM

## 2016-09-12 DIAGNOSIS — I5032 Chronic diastolic (congestive) heart failure: Secondary | ICD-10-CM | POA: Diagnosis not present

## 2016-09-12 DIAGNOSIS — R778 Other specified abnormalities of plasma proteins: Secondary | ICD-10-CM

## 2016-09-12 DIAGNOSIS — I480 Paroxysmal atrial fibrillation: Secondary | ICD-10-CM

## 2016-09-12 DIAGNOSIS — I7121 Aneurysm of the ascending aorta, without rupture: Secondary | ICD-10-CM

## 2016-09-12 LAB — BASIC METABOLIC PANEL
Anion gap: 10 (ref 5–15)
BUN: 15 mg/dL (ref 6–20)
CHLORIDE: 99 mmol/L — AB (ref 101–111)
CO2: 31 mmol/L (ref 22–32)
CREATININE: 0.89 mg/dL (ref 0.44–1.00)
Calcium: 9.8 mg/dL (ref 8.9–10.3)
GFR, EST NON AFRICAN AMERICAN: 57 mL/min — AB (ref >60)
Glucose, Bld: 101 mg/dL — ABNORMAL HIGH (ref 65–99)
POTASSIUM: 3.6 mmol/L (ref 3.5–5.1)
SODIUM: 140 mmol/L (ref 135–145)

## 2016-09-12 LAB — CBC WITH DIFFERENTIAL/PLATELET
BASOS ABS: 0.1 10*3/uL (ref 0.0–0.1)
Basophils Relative: 1 %
Eosinophils Absolute: 0.2 10*3/uL (ref 0.0–0.7)
Eosinophils Relative: 4 %
HEMATOCRIT: 47.1 % — AB (ref 36.0–46.0)
HEMOGLOBIN: 15.7 g/dL — AB (ref 12.0–15.0)
LYMPHS PCT: 34 %
Lymphs Abs: 1.4 10*3/uL (ref 0.7–4.0)
MCH: 30.6 pg (ref 26.0–34.0)
MCHC: 33.3 g/dL (ref 30.0–36.0)
MCV: 91.8 fL (ref 78.0–100.0)
Monocytes Absolute: 0.6 10*3/uL (ref 0.1–1.0)
Monocytes Relative: 15 %
NEUTROS ABS: 1.8 10*3/uL (ref 1.7–7.7)
Neutrophils Relative %: 46 %
Platelets: 191 10*3/uL (ref 150–400)
RBC: 5.13 MIL/uL — AB (ref 3.87–5.11)
RDW: 13 % (ref 11.5–15.5)
WBC: 4 10*3/uL (ref 4.0–10.5)

## 2016-09-12 LAB — MAGNESIUM: Magnesium: 1.7 mg/dL (ref 1.7–2.4)

## 2016-09-12 NOTE — Patient Instructions (Signed)
Your physician recommends that you schedule a follow-up appointment in: 3 Months with Dr. Purvis SheffieldKoneswaran.   Your physician has recommended you make the following change in your medication:   STOP Taking Aspirin   OK To Hold Eliquis for Surgery   If you need a refill on your cardiac medications before your next appointment, please call your pharmacy.   Thank you for choosing [facility]!

## 2016-09-13 ENCOUNTER — Other Ambulatory Visit: Payer: Self-pay | Admitting: *Deleted

## 2016-09-13 ENCOUNTER — Other Ambulatory Visit: Payer: Self-pay | Admitting: Urology

## 2016-09-13 NOTE — Patient Outreach (Addendum)
Transition of care call attempted. No answer and unable to leave a message. I will call again later today.  Almetta LovelyCarroll Aibhlinn Kalmar GNP-BC [facility] Care Manager [phone]  3:00 pm - called pt again but no one answered. I will try to reach her again tomorrow. I also called her son, Beacher MayJack French, and left a message with him that I am trying to reach his mother and request that she return my call.  Almetta LovelyCarroll Aniaya Bacha GNP-BC [facility] Care Manager [phone]  Pt returned my call. I discovered I had one number wrong in her phone number which explains why I could not reach her. She reports she has been scheduled for the lithotripsy next Thursday. She asks where exactly does she need to report to. I advised I will call and find out.   I called [facility] and had to leave a voice mail requesting instructions for pt arrival.   I called Mrs. Kupper back and let her know that I have left a message and will call her when I find out the location she is to report to.  Almetta LovelyCarroll Doshia Dalia A M Surgery CenterGNP-BC [facility] Care Manager [phone]

## 2016-09-14 DIAGNOSIS — I482 Chronic atrial fibrillation: Secondary | ICD-10-CM | POA: Diagnosis not present

## 2016-09-14 DIAGNOSIS — Z6824 Body mass index (BMI) 24.0-24.9, adult: Secondary | ICD-10-CM | POA: Diagnosis not present

## 2016-09-14 DIAGNOSIS — I10 Essential (primary) hypertension: Secondary | ICD-10-CM | POA: Diagnosis not present

## 2016-09-14 DIAGNOSIS — A419 Sepsis, unspecified organism: Secondary | ICD-10-CM | POA: Diagnosis not present

## 2016-09-14 DIAGNOSIS — N390 Urinary tract infection, site not specified: Secondary | ICD-10-CM | POA: Diagnosis not present

## 2016-09-14 DIAGNOSIS — Z09 Encounter for follow-up examination after completed treatment for conditions other than malignant neoplasm: Secondary | ICD-10-CM | POA: Diagnosis not present

## 2016-09-18 ENCOUNTER — Encounter (HOSPITAL_COMMUNITY): Payer: Self-pay | Admitting: *Deleted

## 2016-09-19 NOTE — H&P (Signed)
CC: I have kidney stones.  HPI: [name] is a 83 year-old female patient who is here for renal calculi.  The problem is on the right side. She first stated noticing pain on 08/19/2016. This is her first kidney stone. She is not currently having flank pain, back pain, groin pain, nausea, vomiting, fever or chills. She has not caught a stone in her urine strainer since her symptoms began.   She has never had surgical treatment for calculi in the past.   83 year old female recently admitted to the hospital for shortness of breath and atrial fibrillation on 30 April, comes in today for follow-up of a newly diagnosed right renal stone that was found at the time of her admission. She did have some right flank pain at that time. She was found to have a nonobstructing 10 mm right renal pelvic stone. Urine and blood cultures were negative. She was treated with antibiotics for a presumed UTI, however.   She is now on Eliquis. She has no hematuria or flank pain. She has no prior history of kidney stones. She is having no dysuria. She does have urinary frequency.   ALLERGIES: sulfa   MEDICATIONS: Aspirin Ec 81 mg tablet, delayed release  Caltrate 600 + D  Centrum Silver  Co Q10  Diltiazem 12Hr Er 120 mg capsule, extended release 12 hr  Eliquis 5 mg tablet  Iron 325 mg (65 mg iron) tablet  Omega 3  Timolol Maleate 0.5 % drops  Vitamin C  Vitamin E    GU PSH: None   NON-GU PSH: None   GU PMH: None   NON-GU PMH: Atrial Fibrillation GERD Glaucoma   FAMILY HISTORY: None   SOCIAL HISTORY: Marital Status: Widowed Current Smoking Status: Patient has never smoked.   Tobacco Use Assessment Completed:  Used Tobacco in last 30 days?  Has never drank.  Does not drink caffeine.    Notes: 1 son 1 daughter   REVIEW OF SYSTEMS:    GU Review Female:   Patient reports frequent urination, hard to postpone urination, and leakage of urine. Patient denies burning /pain with urination, get up at night  to urinate, stream starts and stops, trouble starting your stream, have to strain to urinate, and currently pregnant.  Gastrointestinal (Upper):   Patient denies nausea, vomiting, and indigestion/ heartburn.  Gastrointestinal (Lower):   Patient reports diarrhea. Patient denies constipation.  Constitutional:   Patient denies fever, night sweats, weight loss, and fatigue.  Skin:   Patient denies skin rash/ lesion and itching.  Eyes:   Patient denies blurred vision and double vision.  Ears/ Nose/ Throat:   Patient denies sore throat and sinus problems.  Hematologic/Lymphatic:   Patient reports easy bruising. Patient denies swollen glands.  Cardiovascular:   Patient denies leg swelling and chest pains.  Respiratory:   Patient reports shortness of breath. Patient denies cough.  Endocrine:   Patient denies excessive thirst.  Musculoskeletal:   Patient denies back pain and joint pain.  Neurological:   Patient denies headaches and dizziness.  Psychologic:   Patient denies depression and anxiety.   VITAL SIGNS:      09/04/2016 03:13 PM  Weight 145 lb / 65.77 kg  Height 63 in / 160.02 cm  BP 144/83 mmHg  Heart Rate 87 /min  Temperature 98.4 F / 37 C  BMI 25.7 kg/m   GU PHYSICAL EXAMINATION:    Digital Rectal Exam: Normal sphincter tone. No rectal mass.  External Genitalia: No hirsutism, no rash,  no scarring, no cyst, no erythematous lesion, no papular lesion, no blanched lesion, no warty lesion. No edema.  Urethral Meatus: Normal size. Normal position. No discharge.  Urethra: No tenderness, no mass, no scarring. No hypermobility. No leakage.  Bladder: Normal to palpation, no tenderness, no mass, normal size.  Vagina: No atrophy, no stenosis. No rectocele. No cystocele. No enterocele.  Cervix: No inflammation, no discharge, no lesion, no tenderness, no wart.  Uterus: Normal size. Normal consistency. Normal position. No mobility. No descent.  Adnexa / Parametria: No tenderness. No adnexal mass.  Normal left ovary. Normal right ovary.  Anus and Perineum: No hemorrhoids. No anal stenosis. No rectal fissure, no anal fissure. No edema, no dimple, no perineal tenderness, no anal tenderness.   MULTI-SYSTEM PHYSICAL EXAMINATION:    Constitutional: Well-nourished. No physical deformities. Normally developed. Good grooming.  Neck: Neck symmetrical, not swollen. Normal tracheal position.  Respiratory: No labored breathing, no use of accessory muscles.   Skin: No paleness, no jaundice, no cyanosis. No lesion, no ulcer, no rash.  Neurologic / Psychiatric: Oriented to time, oriented to place, oriented to person. No depression, no anxiety, no agitation.  Eyes: Normal conjunctivae. Normal eyelids.  Ears, Nose, Mouth, and Throat: Left ear no scars, no lesions, no masses. Right ear no scars, no lesions, no masses. Nose no scars, no lesions, no masses. Normal hearing. Normal lips.  Musculoskeletal: Normal gait and station of head and neck.    PAST DATA REVIEWED:  Source Of History:  Patient  Records Review:   Previous Doctor Records  Urine Test Review:   Urinalysis  X-Ray Review: C.T. Abdomen/Pelvis: Reviewed Films. Reviewed Report. Discussed With Patient.   Notes:                     Hounsfield unit of her stone 1000. Skin to stone distance about 12 cm. It is a bit difficult to see on her scalp film, but EKG leads may be in the way.   PROCEDURES:         Urinalysis - 81003 Dipstick Dipstick Cont'd  Specimen: Voided Bilirubin: Neg  Color: Yellow Ketones: Neg  Appearance: Turbid Blood: 3+  Specific Gravity: >= 1.030 Protein: 3+  pH: 6.0 Urobilinogen: 0.2  Glucose: Neg Nitrites: Neg    Leukocyte Esterase: 2+   ASSESSMENT:      ICD-10 Details  1 GU:   Renal calculus - N20.0 10 mm right renal pelvic stone, currently asymptomatic but it has been in the past. I think it is amenable to lithotripsy.   PLAN:          Orders Labs Urine Culture  X-Rays: KUB         Schedule Return Visit/Planned  Activity: Next Available Appointment - Schedule Surgery         Document Letter(s):  Created for Patient: Clinical Summary        Notes:   1. I discussed the fact that I think this stone needs to be treated, as she has been symptomatic with it.   2. I did discuss treatment options-doing nothing, treating with percutaneous nephrolithotomy, ureteroscopy with laser as well as shockwave lithotripsy. With this 83 year old female, I favor the last of these   3. The wild card here is whether she can come off her Eliquis. We will contact her cardiologist, Dr. Darl Householder as to the possibility of her coming off of that   4. I did discuss risks and complications of lithotripsy with her. She is motivated  to proceed.   5. Urine was cultured today, no antibiotics administered.   After a thorough review of the management options for the patient's condition the patient  elected to proceed with surgical therapy as noted above. We have discussed the potential benefits and risks of the procedure, side effects of the proposed treatment, the likelihood of the patient achieving the goals of the procedure, and any potential problems that might occur during the procedure or recuperation. Informed consent has been obtained.

## 2016-09-20 ENCOUNTER — Encounter (HOSPITAL_COMMUNITY): Payer: Self-pay | Admitting: *Deleted

## 2016-09-20 ENCOUNTER — Ambulatory Visit (HOSPITAL_COMMUNITY)
Admission: RE | Admit: 2016-09-20 | Discharge: 2016-09-20 | Disposition: A | Discharge status: Home / Self Care | Payer: PPO | Source: Ambulatory Visit | Attending: Urology | Admitting: Urology

## 2016-09-20 ENCOUNTER — Ambulatory Visit: Payer: Self-pay | Admitting: *Deleted

## 2016-09-20 ENCOUNTER — Encounter (HOSPITAL_COMMUNITY)
Admission: RE | Disposition: A | Discharge status: Home / Self Care | Payer: Self-pay | Source: Ambulatory Visit | Attending: Urology

## 2016-09-20 ENCOUNTER — Other Ambulatory Visit: Payer: Self-pay | Admitting: *Deleted

## 2016-09-20 ENCOUNTER — Ambulatory Visit (HOSPITAL_COMMUNITY): Payer: PPO

## 2016-09-20 DIAGNOSIS — I4891 Unspecified atrial fibrillation: Secondary | ICD-10-CM | POA: Diagnosis not present

## 2016-09-20 DIAGNOSIS — K219 Gastro-esophageal reflux disease without esophagitis: Secondary | ICD-10-CM | POA: Diagnosis not present

## 2016-09-20 DIAGNOSIS — H409 Unspecified glaucoma: Secondary | ICD-10-CM | POA: Diagnosis not present

## 2016-09-20 DIAGNOSIS — N200 Calculus of kidney: Secondary | ICD-10-CM | POA: Diagnosis not present

## 2016-09-20 DIAGNOSIS — Z7982 Long term (current) use of aspirin: Secondary | ICD-10-CM | POA: Diagnosis not present

## 2016-09-20 DIAGNOSIS — Z87440 Personal history of urinary (tract) infections: Secondary | ICD-10-CM | POA: Diagnosis not present

## 2016-09-20 DIAGNOSIS — Z79899 Other long term (current) drug therapy: Secondary | ICD-10-CM | POA: Diagnosis not present

## 2016-09-20 HISTORY — DX: Anemia, unspecified: D64.9

## 2016-09-20 HISTORY — PX: EXTRACORPOREAL SHOCK WAVE LITHOTRIPSY: SHX1557

## 2016-09-20 HISTORY — DX: Personal history of other medical treatment: Z92.89

## 2016-09-20 SURGERY — LITHOTRIPSY, ESWL
Anesthesia: LOCAL | Laterality: Right

## 2016-09-20 MED ORDER — DIAZEPAM 5 MG PO TABS
10.0000 mg | ORAL_TABLET | ORAL | Status: AC
  Administered 2016-09-20: 10 mg via ORAL
  Filled 2016-09-20: qty 2

## 2016-09-20 MED ORDER — SODIUM CHLORIDE 0.9 % IV SOLN
INTRAVENOUS | Status: DC
  Administered 2016-09-20: 08:00:00 via INTRAVENOUS

## 2016-09-20 MED ORDER — LEVOFLOXACIN 500 MG PO TABS
500.0000 mg | ORAL_TABLET | ORAL | Status: AC
  Administered 2016-09-20: 500 mg via ORAL
  Filled 2016-09-20: qty 1

## 2016-09-20 MED ORDER — DIPHENHYDRAMINE HCL 25 MG PO CAPS
25.0000 mg | ORAL_CAPSULE | ORAL | Status: AC
  Administered 2016-09-20: 25 mg via ORAL
  Filled 2016-09-20: qty 1

## 2016-09-20 NOTE — Interval H&P Note (Signed)
History and Physical Interval Note:  09/20/2016 7:41 AM  [name]  has presented today for surgery, with the diagnosis of RIGHT RENAL STONE  The various methods of treatment have been discussed with the patient and family. After consideration of risks, benefits and other options for treatment, the patient has consented to  Procedure(s): RIGHT EXTRACORPOREAL SHOCK WAVE LITHOTRIPSY (ESWL) (Right) as a surgical intervention .  The patient's history has been reviewed, patient examined, no change in status, stable for surgery.  I have reviewed the patient's chart and labs.  Questions were answered to the patient's satisfaction.     [name]

## 2016-09-20 NOTE — Patient Outreach (Signed)
Telephone assessment. Pt called me after coming home from having her lithotripsy today. She states she is pain free at present. She is voiding frequently, straining her urine and does see some small particles in the strainer. She does not have any nausea.   She has obtained the 3 prescriptions given to her: hydrocodone and apap, ondosteron, tamsulosin. I advised her the hydrocodone/apap is for moderate to severe pain and she can take it if she needs it. If her pain is minimal she can take some APAP. Also the ondosteron is for nausea as needed. The tamsulosin she should take as directed daily.  She asked if she could eat and I told her since she is talking without any problems and is alert she could eat a small, soft meal tonight. I suggested some eggs and toast with juice, milk or water.   I encouraged her to continue to strain her urine and collect any stones in her container that was provided. She is to call Dr. Scharlene GlossHall's office tomorrow and schedule an appt for next week. She will follow up with Dr. Normajean Baxteralstedt at the end of June. I will call her tomorrow afternoon.  Almetta LovelyCarroll Siya Flurry [facility] Campus[facility]NP-BC [facility] Care Manager [phone]

## 2016-09-20 NOTE — Discharge Instructions (Signed)
DO NOT RESTART BLOOD THINNERS FOR ONE WEEK I WILL DOUBLE CHECK WITH DR DALSTEDT REGARDING  Lithotripsy, Care After This sheet gives you information about how to care for yourself after your procedure. Your health care provider may also give you more specific instructions. If you have problems or questions, contact your health care provider. What can I expect after the procedure? After the procedure, it is common to have:  Some blood in your urine. This should only last for a few days.  Soreness in your back, sides, or upper abdomen for a few days.  Blotches or bruises on your back where the pressure wave entered the skin.  Pain, discomfort, or nausea when pieces (fragments) of the kidney stone move through the tube that carries urine from the kidney to the bladder (ureter). Stone fragments may pass soon after the procedure, but they may continue to pass for up to 4-8 weeks. ? If you have severe pain or nausea, contact your health care provider. This may be caused by a large stone that was not broken up, and this may mean that you need more treatment.  Some pain or discomfort during urination.  Some pain or discomfort in the lower abdomen or (in men) at the base of the penis.  Follow these instructions at home: Medicines  Take over-the-counter and prescription medicines only as told by your health care provider.  If you were prescribed an antibiotic medicine, take it as told by your health care provider. Do not stop taking the antibiotic even if you start to feel better.  Do not drive for 24 hours if you were given a medicine to help you relax (sedative).  Do not drive or use heavy machinery while taking prescription pain medicine. Eating and drinking  Drink enough water and fluids to keep your urine clear or pale yellow. This helps any remaining pieces of the stone to pass. It can also help prevent new stones from forming.  Eat plenty of fresh fruits and vegetables.  Follow  instructions from your health care provider about eating and drinking restrictions. You may be instructed: ? To reduce how much salt (sodium) you eat or drink. Check ingredients and nutrition facts on packaged foods and beverages. ? To reduce how much meat you eat.  Eat the recommended amount of calcium for your age and gender. Ask your health care provider how much calcium you should have. General instructions  Get plenty of rest.  Most people can resume normal activities 1-2 days after the procedure. Ask your health care provider what activities are safe for you.  If directed, strain all urine through the strainer that was provided by your health care provider. ? Keep all fragments for your health care provider to see. Any stones that are found may be sent to a medical lab for examination. The stone may be as small as a grain of salt.  Keep all follow-up visits as told by your health care provider. This is important. Contact a health care provider if:  You have pain that is severe or does not get better with medicine.  You have nausea that is severe or does not go away.  You have blood in your urine longer than your health care provider told you to expect.  You have more blood in your urine.  You have pain during urination that does not go away.  You urinate more frequently than usual and this does not go away.  You develop a rash or any other  possible signs of an allergic reaction. Get help right away if:  You have severe pain in your back, sides, or upper abdomen.  You have severe pain while urinating.  Your urine is very dark red.  You have blood in your stool (feces).  You cannot pass any urine at all.  You feel a strong urge to urinate after emptying your bladder.  You have a fever or chills.  You develop shortness of breath, difficulty breathing, or chest pain.  You have severe nausea that leads to persistent vomiting.  You faint. Summary  After this  procedure, it is common to have some pain, discomfort, or nausea when pieces (fragments) of the kidney stone move through the tube that carries urine from the kidney to the bladder (ureter). If this pain or nausea is severe, however, you should contact your health care provider.  Most people can resume normal activities 1-2 days after the procedure. Ask your health care provider what activities are safe for you.  Drink enough water and fluids to keep your urine clear or pale yellow. This helps any remaining pieces of the stone to pass, and it can help prevent new stones from forming.  If directed, strain your urine and keep all fragments for your health care provider to see. Fragments or stones may be as small as a grain of salt.  Get help right away if you have severe pain in your back, sides, or upper abdomen or have severe pain while urinating. This information is not intended to replace advice given to you by your health care provider. Make sure you discuss any questions you have with your health care provider. Document Released: 04/29/2007 Document Revised: 02/29/2016 Document Reviewed: 02/29/2016 Elsevier Interactive Patient Education  2017 [employer]  Moderate Conscious Sedation, Adult, Care After These instructions provide you with information about caring for yourself after your procedure. Your health care provider may also give you more specific instructions. Your treatment has been planned according to current medical practices, but problems sometimes occur. Call your health care provider if you have any problems or questions after your procedure. What can I expect after the procedure? After your procedure, it is common:  To feel sleepy for several hours.  To feel clumsy and have poor balance for several hours.  To have poor judgment for several hours.  To vomit if you eat too soon.  Follow these instructions at home: For at least 24 hours after the procedure:   Do  not: ? Participate in activities where you could fall or become injured. ? Drive. ? Use heavy machinery. ? Drink alcohol. ? Take sleeping pills or medicines that cause drowsiness. ? Make important decisions or sign legal documents. ? Take care of children on your own.  Rest. Eating and drinking  Follow the diet recommended by your health care provider.  If you vomit: ? Drink water, juice, or soup when you can drink without vomiting. ? Make sure you have little or no nausea before eating solid foods. General instructions  Have a responsible adult stay with you until you are awake and alert.  Take over-the-counter and prescription medicines only as told by your health care provider.  If you smoke, do not smoke without supervision.  Keep all follow-up visits as told by your health care provider. This is important. Contact a health care provider if:  You keep feeling nauseous or you keep vomiting.  You feel light-headed.  You develop a rash.  You have a fever.  Get help right away if:  You have trouble breathing. This information is not intended to replace advice given to you by your health care provider. Make sure you discuss any questions you have with your health care provider. Document Released: 01/28/2013 Document Revised: 09/12/2015 Document Reviewed: 07/30/2015 Elsevier Interactive Patient Education  [employer].

## 2016-09-21 ENCOUNTER — Ambulatory Visit: Payer: Self-pay | Admitting: *Deleted

## 2016-09-21 ENCOUNTER — Other Ambulatory Visit: Payer: Self-pay | Admitting: Urology

## 2016-09-21 DIAGNOSIS — N200 Calculus of kidney: Secondary | ICD-10-CM

## 2016-09-24 ENCOUNTER — Encounter (HOSPITAL_COMMUNITY): Payer: Self-pay | Admitting: Urology

## 2016-09-24 ENCOUNTER — Ambulatory Visit (HOSPITAL_COMMUNITY)
Admission: RE | Admit: 2016-09-24 | Discharge: 2016-09-24 | Disposition: A | Discharge status: Home / Self Care | Payer: PPO | Source: Ambulatory Visit | Attending: Urology | Admitting: Urology

## 2016-09-24 DIAGNOSIS — N1330 Unspecified hydronephrosis: Secondary | ICD-10-CM | POA: Diagnosis not present

## 2016-09-24 DIAGNOSIS — N281 Cyst of kidney, acquired: Secondary | ICD-10-CM | POA: Diagnosis not present

## 2016-09-24 DIAGNOSIS — N132 Hydronephrosis with renal and ureteral calculous obstruction: Secondary | ICD-10-CM | POA: Diagnosis not present

## 2016-09-24 DIAGNOSIS — N200 Calculus of kidney: Secondary | ICD-10-CM

## 2016-09-25 ENCOUNTER — Ambulatory Visit (INDEPENDENT_AMBULATORY_CARE_PROVIDER_SITE_OTHER): Payer: Self-pay | Admitting: Urology

## 2016-09-25 ENCOUNTER — Other Ambulatory Visit (HOSPITAL_COMMUNITY)
Admission: AD | Admit: 2016-09-25 | Discharge: 2016-09-25 | Disposition: A | Discharge status: Home / Self Care | Payer: PPO | Source: Skilled Nursing Facility | Attending: Urology | Admitting: Urology

## 2016-09-25 ENCOUNTER — Ambulatory Visit: Payer: Self-pay | Admitting: *Deleted

## 2016-09-25 DIAGNOSIS — N200 Calculus of kidney: Secondary | ICD-10-CM

## 2016-09-26 ENCOUNTER — Encounter: Payer: Self-pay | Admitting: *Deleted

## 2016-09-26 ENCOUNTER — Other Ambulatory Visit: Payer: Self-pay | Admitting: *Deleted

## 2016-09-26 NOTE — Patient Outreach (Signed)
Telephone assessment, last transition of care and case closure.  [name] has had her follow up with Dr. Vernie Shanks. She had an X Ray which reveals she is still retaining some of her kidney stone. They have discussed the possiblity of her having another lithotripsy. He will see her again at the end of the month and re-evaluate the need for this. She denies pain and is not seeing any blood in her urine.  I have advised her that her transition of care goals have been met and I am closing her case. She states she would like the opportunity to be able to call me if she has future problems or questions and I encouraged her to do so.    Deloria Lair [facility] [phone]

## 2016-10-03 LAB — STONE ANALYSIS
CA OXALATE, MONOHYDR.: 82 %
Ca Oxalate,Dihydrate: 5 %
Ca phos cry stone ql IR: 13 %
STONE WEIGHT KSTONE: 14 mg

## 2016-10-15 DIAGNOSIS — D500 Iron deficiency anemia secondary to blood loss (chronic): Secondary | ICD-10-CM | POA: Diagnosis not present

## 2016-10-15 DIAGNOSIS — I10 Essential (primary) hypertension: Secondary | ICD-10-CM | POA: Diagnosis not present

## 2016-10-17 DIAGNOSIS — I482 Chronic atrial fibrillation: Secondary | ICD-10-CM | POA: Diagnosis not present

## 2016-10-17 DIAGNOSIS — E782 Mixed hyperlipidemia: Secondary | ICD-10-CM | POA: Diagnosis not present

## 2016-10-17 DIAGNOSIS — N209 Urinary calculus, unspecified: Secondary | ICD-10-CM | POA: Diagnosis not present

## 2016-10-17 DIAGNOSIS — Z6824 Body mass index (BMI) 24.0-24.9, adult: Secondary | ICD-10-CM | POA: Diagnosis not present

## 2016-10-23 ENCOUNTER — Ambulatory Visit (HOSPITAL_COMMUNITY)
Admission: RE | Admit: 2016-10-23 | Discharge: 2016-10-23 | Disposition: A | Discharge status: Home / Self Care | Payer: PPO | Source: Ambulatory Visit | Attending: Urology | Admitting: Urology

## 2016-10-23 ENCOUNTER — Other Ambulatory Visit: Payer: Self-pay | Admitting: Urology

## 2016-10-23 ENCOUNTER — Ambulatory Visit (INDEPENDENT_AMBULATORY_CARE_PROVIDER_SITE_OTHER): Payer: Self-pay | Admitting: Urology

## 2016-10-23 DIAGNOSIS — N200 Calculus of kidney: Secondary | ICD-10-CM

## 2016-10-23 DIAGNOSIS — N201 Calculus of ureter: Secondary | ICD-10-CM

## 2016-12-21 ENCOUNTER — Ambulatory Visit (INDEPENDENT_AMBULATORY_CARE_PROVIDER_SITE_OTHER): Payer: PPO | Admitting: Cardiovascular Disease

## 2016-12-21 ENCOUNTER — Encounter: Payer: Self-pay | Admitting: Cardiovascular Disease

## 2016-12-21 VITALS — BP 146/88 | HR 84 | Ht 63.0 in | Wt 143.2 lb

## 2016-12-21 DIAGNOSIS — I5032 Chronic diastolic (congestive) heart failure: Secondary | ICD-10-CM

## 2016-12-21 DIAGNOSIS — I10 Essential (primary) hypertension: Secondary | ICD-10-CM

## 2016-12-21 DIAGNOSIS — R748 Abnormal levels of other serum enzymes: Secondary | ICD-10-CM | POA: Diagnosis not present

## 2016-12-21 DIAGNOSIS — D696 Thrombocytopenia, unspecified: Secondary | ICD-10-CM | POA: Diagnosis not present

## 2016-12-21 DIAGNOSIS — R778 Other specified abnormalities of plasma proteins: Secondary | ICD-10-CM

## 2016-12-21 DIAGNOSIS — I480 Paroxysmal atrial fibrillation: Secondary | ICD-10-CM

## 2016-12-21 DIAGNOSIS — I712 Thoracic aortic aneurysm, without rupture, unspecified: Secondary | ICD-10-CM

## 2016-12-21 DIAGNOSIS — M7989 Other specified soft tissue disorders: Secondary | ICD-10-CM

## 2016-12-21 DIAGNOSIS — R7989 Other specified abnormal findings of blood chemistry: Secondary | ICD-10-CM

## 2016-12-21 DIAGNOSIS — I2510 Atherosclerotic heart disease of native coronary artery without angina pectoris: Secondary | ICD-10-CM | POA: Diagnosis not present

## 2016-12-21 NOTE — Addendum Note (Signed)
Addended by: Marlyn CorporalARLTON, CATHERINE A on: 12/21/2016 11:35 AM   Modules accepted: Orders

## 2016-12-21 NOTE — Progress Notes (Signed)
SUBJECTIVE: The patient presents for routine follow-up. I evaluated her while hospitalized in May 2018. She has atrial fibrillation, thrombocytopenia, demand ischemia, and an ascending thoracic aortic aneurysm. It measured 4.6 cm by CTA. She also has mild to moderate mitral regurgitation. Troponins peaked at 0.38. She has normal left ventricular systolic function.  She is doing well and denies chest pain, palpitations, and shortness of breath. She had a fall last Friday and injured her left leg. She had some pain with walking on it this morning but is able to apply pressure. She denies dizziness and lightheadedness.  She is here with her daughter, Guy Sandifer. [name]'s has 3 children, a daughter who is an Technical sales engineer in [place], [place], one son who works for a company in [place] who's wife is Melina Modena (marketing at [employer]), and another son, Overton Mam, who is a Training and development officer for [employer]. 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   Review of Systems: As per "subjective", otherwise negative.  Allergies  Allergen Reactions  . Sulfa Antibiotics Other (See Comments)    Pt states that this med makes her feel crazy.      Current Outpatient Prescriptions  Medication Sig Dispense Refill  . Ascorbic Acid (VITAMIN C) 1000 MG tablet Take 1,000 mg by mouth daily.    . Calcium Carbonate-Vitamin D (CALCIUM 600+D) 600-400 MG-UNIT tablet Take 1 tablet by mouth daily.    . Coenzyme Q10 (COQ10) 100 MG CAPS Take 200 mg by mouth daily.     Marland Kitchen diltiazem (CARDIZEM CD) 120 MG 24 hr capsule Take 1 capsule (120 mg total) by mouth daily. 30 capsule 1  . ELIQUIS 5 MG TABS tablet Take 1 tablet by mouth 2 (two) times daily.    . ferrous sulfate 325 (65 FE) MG tablet Take 325 mg by mouth every other day.    . furosemide (LASIX) 20 MG tablet Take 1 tablet (20 mg total) by mouth daily as needed. (Patient taking differently: Take 20 mg by mouth daily. ) 30 tablet 0  .  gabapentin (NEURONTIN) 300 MG capsule Take 300 mg by mouth at bedtime.     . Multiple Vitamin (MULTIVITAMIN WITH MINERALS) TABS tablet Take 1 tablet by mouth daily.    . NON FORMULARY Take 250 mg by mouth daily. Keratin    . omega-3 acid ethyl esters (LOVAZA) 1 g capsule Take 1 g by mouth 2 (two) times daily.    . pantoprazole (PROTONIX) 40 MG tablet TAKE ONE TABLET BY MOUTH ONCE DAILY. 30 tablet 5  . Plant Sterol Stanol-Pantethine 450-75 MG TABS Take 1 tablet by mouth daily.     . pravastatin (PRAVACHOL) 40 MG tablet Take 40 mg by mouth at bedtime.     . timolol (TIMOPTIC) 0.5 % ophthalmic solution Place 1 drop into both eyes daily.    . vitamin E 400 UNIT capsule Take 400 Units by mouth daily.     No current facility-administered medications for this visit.     Past Medical History:  Diagnosis Date  . Anemia   . Ascending aortic aneurysm (HCC)    a. 4.6cm by CT 07/2016.  Marland Kitchen Chronic diastolic CHF (congestive heart failure) (HCC)   . Coronary artery calcification seen on CT scan 07/2016  . History of blood transfusion   . Hypertension   . Hypomagnesemia   . PAF (paroxysmal atrial fibrillation) (HCC)    a. dx during adm 08/2016.  Marland Kitchen 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  Negative Jak2, BCR/ABL, normal epo level on 02/01/2014  . Sepsis (HCC) 08/2016  . Thrombocytopenia (HCC)   . Trigeminal neuralgia     Past Surgical History:  Procedure Laterality Date  . BREAST CYST EXCISION  60 yrs ago  . COLONOSCOPY WITH ESOPHAGOGASTRODUODENOSCOPY (EGD) N/A 03/10/2013   Procedure: COLONOSCOPY WITH ESOPHAGOGASTRODUODENOSCOPY (EGD);  Surgeon: Malissa Hippo, MD;  Location: [facility];  Service: Endoscopy;  Laterality: N/A;  730  . EXTRACORPOREAL SHOCK WAVE LITHOTRIPSY Right 09/20/2016   Procedure: RIGHT EXTRACORPOREAL SHOCK WAVE LITHOTRIPSY (ESWL);  Surgeon: Alfredo Martinez, MD;  Location: [facility];  Service: Urology;  Laterality: Right;  . EYE SURGERY     cataract surgery bilat   . Gamma Knife       Trigeminal   . SLT LASER APPLICATION Left 09/27/2014   Procedure: SLT LASER APPLICATION;  Surgeon: Susa Simmonds, MD;  Location: [facility];  Service: Ophthalmology;  Laterality: Left;    Social History   Social History  . Marital status: Widowed    Spouse name: N/A  . Number of children: N/A  . Years of education: N/A   Occupational History  . Not on file.   Social History Main Topics  . Smoking status: Never Smoker  . Smokeless tobacco: Never Used  . Alcohol use No  . Drug use: No  . Sexual activity: No   Other Topics Concern  . Not on file   Social History Narrative  . No narrative on file     Vitals:   12/21/16 1036  BP: (!) 146/88  Pulse: 84  SpO2: 96%  Weight: 143 lb 3.2 oz (65 kg)  Height: 5\' 3"  (1.6 m)    Wt Readings from Last 3 Encounters:  12/21/16 143 lb 3.2 oz (65 kg)  09/20/16 139 lb 6 oz (63.2 kg)  09/12/16 140 lb (63.5 kg)     PHYSICAL EXAM General: NAD HEENT: Normal. Neck: No JVD, no thyromegaly. Lungs: Clear to auscultation bilaterally with normal respiratory effort. CV: Nondisplaced PMI.  Regular rate and rhythm, normal S1/S2, no S3/S4, no murmur. +Left leg swelling. Mild pretibial erythema. No carotid bruit.   Abdomen: Soft, nontender, no distention.  Neurologic: Alert and oriented.  Psych: Normal affect. Skin: Normal. Musculoskeletal: Kyphotic. +left leg swelling. Mild pretibial erythema.    ECG: Most recent ECG reviewed.   Labs: Lab Results  Component Value Date/Time   K 3.6 09/12/2016 03:33 PM   BUN 15 09/12/2016 03:33 PM   CREATININE 0.89 09/12/2016 03:33 PM   ALT 37 08/21/2016 06:52 AM   TSH 2.377 08/24/2016 05:40 AM   HGB 15.7 (H) 09/12/2016 03:33 PM     Lipids: No results found for: LDLCALC, LDLDIRECT, CHOL, TRIG, HDL     ASSESSMENT AND PLAN:  1. Paroxysmal atrial fibrillation: Symptomatically stable. Continue Eliquis for anticoagulation. Continue long-acting diltiazem 120 mg daily.  2. Elevated troponin  level/demand ischemia/coronary artery calcifications seen on CT: Asymptomatic. No indication for stress testing at this time.  3. Chronic diastolic heart failure: Euvolemic. Continue Lasix 20 mg daily.  4. Ascending thoracic aortic aneurysm: Repeat CTA in October 2018. 4.6 cm in diameter on 08/20/2016.  5. Thrombocytopenia: Platelets normalized up 291 on 09/12/16. Prior thrombocytopenia likely due to sepsis.  6. Fall with left leg swelling: She is able to provide pressure on it. I do not feel there is an indication for x-rays at this time. There does not appear to be any significant cellulitis warranting oral antimicrobial therapy.  7. Hypertension:  Mildly elevated. Will monitor for trend.     Disposition: Follow up 6 months.  Time spent: 40 minutes, of which greater than 50% was spent reviewing symptoms, relevant blood tests and studies, and discussing management plan with the patient.    Prentice DockerSuresh Boss Danielsen, M.D., F.A.C.C.

## 2016-12-21 NOTE — Patient Instructions (Signed)
Your physician wants you to follow-up in: 6 months with Dr.Koneswaran You will receive a reminder letter in the mail two months in advance. If you don't receive a letter, please call our office to schedule the follow-up appointment.    Your physician recommends that you continue on your current medications as directed. Please refer to the Current Medication list given to you today.    If you need a refill on your cardiac medications before your next appointment, please call your pharmacy.     Pleas schedule chest CT with Dahlia ClientHannah at the front desk      Thank you for choosing [facility] !

## 2016-12-27 DIAGNOSIS — L03116 Cellulitis of left lower limb: Secondary | ICD-10-CM | POA: Diagnosis not present

## 2016-12-27 DIAGNOSIS — Z6824 Body mass index (BMI) 24.0-24.9, adult: Secondary | ICD-10-CM | POA: Diagnosis not present

## 2016-12-27 DIAGNOSIS — M79662 Pain in left lower leg: Secondary | ICD-10-CM | POA: Diagnosis not present

## 2017-01-22 ENCOUNTER — Other Ambulatory Visit (HOSPITAL_COMMUNITY)
Admission: RE | Admit: 2017-01-22 | Discharge: 2017-01-22 | Disposition: A | Discharge status: Home / Self Care | Payer: PPO | Source: Ambulatory Visit | Attending: Cardiovascular Disease | Admitting: Cardiovascular Disease

## 2017-01-22 DIAGNOSIS — I712 Thoracic aortic aneurysm, without rupture: Secondary | ICD-10-CM | POA: Insufficient documentation

## 2017-01-22 LAB — CREATININE, SERUM: CREATININE: 0.81 mg/dL (ref 0.44–1.00)

## 2017-01-22 LAB — BUN: BUN: 18 mg/dL (ref 6–20)

## 2017-01-23 ENCOUNTER — Ambulatory Visit (HOSPITAL_COMMUNITY)
Admission: RE | Admit: 2017-01-23 | Discharge: 2017-01-23 | Disposition: A | Discharge status: Home / Self Care | Payer: PPO | Source: Ambulatory Visit | Attending: Cardiovascular Disease | Admitting: Cardiovascular Disease

## 2017-01-23 ENCOUNTER — Other Ambulatory Visit: Payer: Self-pay

## 2017-01-23 ENCOUNTER — Telehealth: Payer: Self-pay

## 2017-01-23 DIAGNOSIS — I712 Thoracic aortic aneurysm, without rupture, unspecified: Secondary | ICD-10-CM

## 2017-01-23 DIAGNOSIS — K449 Diaphragmatic hernia without obstruction or gangrene: Secondary | ICD-10-CM | POA: Diagnosis not present

## 2017-01-23 DIAGNOSIS — I7121 Aneurysm of the ascending aorta, without rupture: Secondary | ICD-10-CM

## 2017-01-23 DIAGNOSIS — I700 Atherosclerosis of aorta: Secondary | ICD-10-CM | POA: Insufficient documentation

## 2017-01-23 MED ORDER — IOPAMIDOL (ISOVUE-370) INJECTION 76%
100.0000 mL | Freq: Once | INTRAVENOUS | Status: AC | PRN
  Administered 2017-01-23: 100 mL via INTRAVENOUS

## 2017-01-23 NOTE — Telephone Encounter (Signed)
Patient notified , ref placed to [facility], copied pcp

## 2017-01-23 NOTE — Telephone Encounter (Signed)
-----   Message from Laqueta Linden, MD sent at 01/23/2017  1:58 PM EDT ----- This has increased in size to 4.9 cm. Please make a referral to CT surgery for additional surveillance monitoring and eventual management.

## 2017-02-07 DIAGNOSIS — Z23 Encounter for immunization: Secondary | ICD-10-CM | POA: Diagnosis not present

## 2017-02-11 ENCOUNTER — Other Ambulatory Visit (INDEPENDENT_AMBULATORY_CARE_PROVIDER_SITE_OTHER): Payer: Self-pay | Admitting: *Deleted

## 2017-02-11 MED ORDER — PANTOPRAZOLE SODIUM 40 MG PO TBEC: 40.0000 mg | DELAYED_RELEASE_TABLET | Freq: Every day | ORAL | 5 refills | Status: DC

## 2017-02-15 DIAGNOSIS — H1851 Endothelial corneal dystrophy: Secondary | ICD-10-CM | POA: Diagnosis not present

## 2017-02-15 DIAGNOSIS — H40013 Open angle with borderline findings, low risk, bilateral: Secondary | ICD-10-CM | POA: Diagnosis not present

## 2017-02-15 DIAGNOSIS — Z961 Presence of intraocular lens: Secondary | ICD-10-CM | POA: Diagnosis not present

## 2017-02-15 DIAGNOSIS — H16221 Keratoconjunctivitis sicca, not specified as Sjogren's, right eye: Secondary | ICD-10-CM | POA: Diagnosis not present

## 2017-02-15 DIAGNOSIS — H25812 Combined forms of age-related cataract, left eye: Secondary | ICD-10-CM | POA: Diagnosis not present

## 2017-02-15 DIAGNOSIS — H35371 Puckering of macula, right eye: Secondary | ICD-10-CM | POA: Diagnosis not present

## 2017-02-19 ENCOUNTER — Encounter: Payer: Self-pay | Admitting: Thoracic Surgery (Cardiothoracic Vascular Surgery)

## 2017-02-19 ENCOUNTER — Institutional Professional Consult (permissible substitution) (INDEPENDENT_AMBULATORY_CARE_PROVIDER_SITE_OTHER): Payer: PPO | Admitting: Thoracic Surgery (Cardiothoracic Vascular Surgery)

## 2017-02-19 VITALS — BP 173/97 | HR 82 | Ht 63.0 in | Wt 143.0 lb

## 2017-02-19 DIAGNOSIS — I7121 Aneurysm of the ascending aorta, without rupture: Secondary | ICD-10-CM

## 2017-02-19 DIAGNOSIS — I712 Thoracic aortic aneurysm, without rupture: Secondary | ICD-10-CM

## 2017-02-19 DIAGNOSIS — I10 Essential (primary) hypertension: Secondary | ICD-10-CM | POA: Diagnosis not present

## 2017-02-19 MED ORDER — METOPROLOL SUCCINATE ER 25 MG PO TB24: 25.0000 mg | ORAL_TABLET | Freq: Every day | ORAL | 5 refills | Status: DC

## 2017-02-19 NOTE — Patient Instructions (Addendum)
Start Toprol XL (metoprolol) 25 mg once daily  Continue other medications  Goal blood pressure < 130 systolic, mandatory < 140

## 2017-02-19 NOTE — Progress Notes (Signed)
PCP is Benita StabileHall, John Z, MD Referring Provider is Laqueta LindenKoneswaran, Suresh A, MD  Chief Complaint  Patient presents with  . New Patient (Initial Visit)    thoracic ascending aortic aneurysm 4.9cm, CT chest 01/23/2017    HPI: 83 year old woman sent for consultation regarding a 4.9 cm ascending aneurysm.  [name] is an 83 year old woman with a past history of hypertension, paroxysmal atrial fibrillation, polycythemia, trigeminal neuralgia, and chronic diastolic heart failure.  She also has a history of thrombocytopenia related to sepsis which has resolved.  She was hospitalized earlier this year with shortness of breath.  She was in atrial fibrillation.  She says that it converted to normal "by itself."  She currently is on diltiazem and Eliquis.  She denies any chest pain, pressure, tightness, or shortness of breath with exertion.  Activities are fairly limited, but she does live at home by herself and takes care of her own daily needs.   Past Medical History:  Diagnosis Date  . Anemia   . Ascending aortic aneurysm (HCC)    a. 4.6cm by CT 07/2016.  Marland Kitchen. Chronic diastolic CHF (congestive heart failure) (HCC)   . Coronary artery calcification seen on CT scan 07/2016  . History of blood transfusion   . Hypertension   . Hypomagnesemia   . PAF (paroxysmal atrial fibrillation) (HCC)    a. dx during adm 08/2016.  Marland Kitchen. 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 Sepsis (HCC) 08/2016  . Thrombocytopenia (HCC)   . Trigeminal neuralgia     Past Surgical History:  Procedure Laterality Date  . BREAST CYST EXCISION  60 yrs ago  . COLONOSCOPY WITH ESOPHAGOGASTRODUODENOSCOPY (EGD) N/A 03/10/2013   Procedure: COLONOSCOPY WITH ESOPHAGOGASTRODUODENOSCOPY (EGD);  Surgeon: Malissa HippoNajeeb U Rehman, MD;  [facility];  Service: Endoscopy;  Laterality: N/A;  730  . EXTRACORPOREAL SHOCK WAVE LITHOTRIPSY Right 09/20/2016   Procedure: RIGHT EXTRACORPOREAL SHOCK WAVE  LITHOTRIPSY (ESWL);  Surgeon: Alfredo MartinezMacDiarmid, Scott, MD;  Location: [facility];  Service: Urology;  Laterality: Right;  . EYE SURGERY     cataract surgery bilat   . Gamma Knife     Trigeminal   . SLT LASER APPLICATION Left 09/27/2014   Procedure: SLT LASER APPLICATION;  Surgeon: [name], MD;  Location: [facility];  Service: Ophthalmology;  Laterality: Left;    Family History  Problem Relation Age of Onset  . CAD Neg Hx     Social History Social History  Substance Use Topics  . Smoking status: Never Smoker  . Smokeless tobacco: Never Used  . Alcohol use No    Current Outpatient Prescriptions  Medication Sig Dispense Refill  . Ascorbic Acid (VITAMIN C) 1000 MG tablet Take 1,000 mg by mouth daily.    . Calcium Carbonate-Vitamin D (CALCIUM 600+D) 600-400 MG-UNIT tablet Take 1 tablet by mouth daily.    . Coenzyme Q10 (COQ10) 100 MG CAPS Take 200 mg by mouth daily.     Marland Kitchen. diltiazem (CARDIZEM CD) 120 MG 24 hr capsule Take 1 capsule (120 mg total) by mouth daily. 30 capsule 1  . ELIQUIS 5 MG TABS tablet Take 1 tablet by mouth 2 (two) times daily.    . ferrous sulfate 325 (65 FE) MG tablet Take 325 mg by mouth every other day.    . furosemide (LASIX) 20 MG tablet Take 1 tablet (20 mg total) by mouth daily as needed. (Patient taking differently: Take 20 mg by mouth daily. ) 30 tablet  0  . gabapentin (NEURONTIN) 300 MG capsule Take 300 mg by mouth at bedtime.     . Multiple Vitamin (MULTIVITAMIN WITH MINERALS) TABS tablet Take 1 tablet by mouth daily.    . NON FORMULARY Take 250 mg by mouth daily. Keratin    . omega-3 acid ethyl esters (LOVAZA) 1 g capsule Take 1 g by mouth 2 (two) times daily.    . pantoprazole (PROTONIX) 40 MG tablet Take 1 tablet (40 mg total) by mouth daily. 30 tablet 5  . Plant Sterol Stanol-Pantethine 450-75 MG TABS Take 1 tablet by mouth daily.     . pravastatin (PRAVACHOL) 40 MG tablet Take 40 mg by mouth at bedtime.     . timolol (TIMOPTIC) 0.5 % ophthalmic solution  Place 1 drop into both eyes daily.    . vitamin E 400 UNIT capsule Take 400 Units by mouth daily.    . metoprolol succinate (TOPROL XL) 25 MG 24 hr tablet Take 1 tablet (25 mg total) by mouth daily. 30 tablet 5   No current facility-administered medications for this visit.     Allergies  Allergen Reactions  . Sulfa Antibiotics Other (See Comments)    Pt states that this med makes her feel crazy.      Review of Systems  Constitutional: Negative for activity change and unexpected weight change.  HENT: Negative for trouble swallowing and voice change.   Eyes: Positive for visual disturbance (Stop driving after cataract surgery).  Respiratory: Negative for shortness of breath and wheezing.   Cardiovascular: Positive for leg swelling. Negative for chest pain.       Varicose veins  Gastrointestinal: Negative for abdominal pain and blood in stool.  Genitourinary: Positive for hematuria.       Kidney stone  Musculoskeletal: Positive for gait problem (Has difficulty seeing steps). Negative for arthralgias.  Neurological: Negative for speech difficulty and weakness.  Hematological: Negative for adenopathy. Does not bruise/bleed easily.  All other systems reviewed and are negative.   BP (!) 173/97   Pulse 82   Ht 5\' 3"  (1.6 m)   Wt 143 lb (64.9 kg)   SpO2 96%   BMI 25.33 kg/m  Physical Exam  Constitutional: She is oriented to person, place, and time. She appears well-developed and well-nourished. No distress.  HENT:  Head: Normocephalic and atraumatic.  Mouth/Throat: No oropharyngeal exudate.  Eyes: Conjunctivae and EOM are normal. No scleral icterus.  Neck: Neck supple. No thyromegaly present.  No carotid bruits  Cardiovascular: Normal rate, regular rhythm and normal heart sounds.   No murmur heard. Pulmonary/Chest: Effort normal and breath sounds normal. No respiratory distress. She has no wheezes. She has no rales.  Abdominal: Soft. She exhibits no distension. There is no  tenderness.  Musculoskeletal: Normal range of motion. She exhibits no edema.  Lymphadenopathy:    She has no cervical adenopathy.  Neurological: She is alert and oriented to person, place, and time. No cranial nerve deficit. She exhibits normal muscle tone.  Skin: Skin is warm and dry.  Vitals reviewed.    Diagnostic Tests: CT ANGIOGRAPHY CHEST WITH CONTRAST  TECHNIQUE: Multidetector CT imaging of the chest was performed using the standard protocol during bolus administration of intravenous contrast. Multiplanar CT image reconstructions and MIPs were obtained to evaluate the vascular anatomy.  CONTRAST:  100 cc Isovue 370 IV.  COMPARISON:  08/24/2016 chest radiograph. 08/20/2016 chest CT angiogram.  FINDINGS: Cardiovascular: Normal heart size. No significant pericardial fluid/thickening. Atherosclerotic thoracic aorta. Aortic root measures  3.2 cm diameter at the level of the sinuses of Valsalva, previously 3.2 cm. Ascending thoracic aortic aneurysm measures 4.9 cm in maximum diameter, previously 4.6 cm, increased. Aortic isthmus diameter 3.2 cm, previously 3.2 cm. Mid descending thoracic aortic diameter 3.0 cm, previously 3.0 cm. Aortic diameter 2.9 cm at the diaphragmatic hiatus, previously 2.9 cm. No evidence of acute intramural hematoma, acute aortic dissection, pseudoaneurysm or penetrating atherosclerotic ulcer. Aortic arch branch vessels are patent. Normal caliber pulmonary arteries. No central pulmonary emboli.  Mediastinum/Nodes: No discrete thyroid nodules. Unremarkable esophagus. No pathologically enlarged axillary, mediastinal or hilar lymph nodes.  Lungs/Pleura: No pneumothorax. No pleural effusion. Subsegmental scarring versus atelectasis in the bilateral medial lower lobes. No acute consolidative airspace disease or lung masses. Subpleural 3 mm left upper lobe pulmonary nodule (series 9/ image 22), stable since 08/20/2016, considered benign. No new  significant pulmonary nodules.  Upper abdomen: Moderate to large hiatal hernia. Partially visualized simple appearing 2.0 cm renal cyst in the lateral interpolar left kidney.  Musculoskeletal: No aggressive appearing focal osseous lesions. Marked thoracic spondylosis.  Review of the MIP images confirms the above findings.  IMPRESSION: 1. Ascending thoracic aortic 4.9 cm aneurysm, increased. Recommend semi-annual imaging followup by CTA or MRA and referral to cardiothoracic surgery if not already obtained. This recommendation follows 2010 ACCF/AHA/AATS/ACR/ASA/SCA/SCAI/SIR/STS/SVM Guidelines for the Diagnosis and Management of Patients With Thoracic Aortic Disease. Circulation. 2010; 121: O130-Q657. 2. Moderate to large hiatal hernia.  Aortic Atherosclerosis (ICD10-I70.0).   Electronically Signed   By: Delbert Phenix M.D.   On: 01/23/2017 13:15 I personally reviewed the CT images and concur with the findings noted above.    Impression: [name] is an 83 year old woman in generally good overall health who has a history of hypertension, paroxysmal atrial fibrillation, chronic congestive heart failure, and trigeminal neuralgia, she was being evaluated for shortness of breath earlier this spring.  A CT of the chest was done to rule out pulmonary embolus.  She was noted to have an ascending aneurysm.  It measured at 4.6 cm, but was of poor quality scan for determining the exact aortic size.  In any event she had a repeat scan recently which showed a 4.9 cm ascending aneurysm.  There is no definite indication for surgery at this point in time.  Typical indications include a diameter of 5.5 cm or growth of more than 5 mm in 6 months.  In her case she is relatively small woman but her aorta overall is generous in size.  When indexed for her BSA of 1.7 she falls into the 8 %/year risk group which would be lower than her surgical risk for major morbidity at this  point.  Hypertension-her blood pressure was markedly elevated at 170 systolic today.  She recalls being 142 at her recent OB/GYN visit.  In the event either of those numbers is too high for an aneurysm of that size.  I recommended that we start her on Toprol-XL 25 mg daily in addition to her diltiazem.  She also was advised to watch her salt intake  She was advised not to lift anything over 25 pounds and to avoid straining.  There is no contraindication to aerobic exercise.  Plan: Toprol-XL 25 mg daily  Return in 6 months with CT angio of chest-if there is any increase in size at that point we will have to seriously consider surgical repair  Loreli Slot, MD [facility] [phone]

## 2017-04-22 ENCOUNTER — Encounter (INDEPENDENT_AMBULATORY_CARE_PROVIDER_SITE_OTHER): Payer: Self-pay | Admitting: Internal Medicine

## 2017-04-22 DIAGNOSIS — I10 Essential (primary) hypertension: Secondary | ICD-10-CM | POA: Diagnosis not present

## 2017-04-22 DIAGNOSIS — D509 Iron deficiency anemia, unspecified: Secondary | ICD-10-CM | POA: Diagnosis not present

## 2017-04-22 DIAGNOSIS — R7301 Impaired fasting glucose: Secondary | ICD-10-CM | POA: Diagnosis not present

## 2017-04-22 DIAGNOSIS — E782 Mixed hyperlipidemia: Secondary | ICD-10-CM | POA: Diagnosis not present

## 2017-04-24 DIAGNOSIS — E782 Mixed hyperlipidemia: Secondary | ICD-10-CM | POA: Diagnosis not present

## 2017-04-24 DIAGNOSIS — Z0000 Encounter for general adult medical examination without abnormal findings: Secondary | ICD-10-CM | POA: Diagnosis not present

## 2017-04-24 DIAGNOSIS — I482 Chronic atrial fibrillation: Secondary | ICD-10-CM | POA: Diagnosis not present

## 2017-04-24 DIAGNOSIS — Z23 Encounter for immunization: Secondary | ICD-10-CM | POA: Diagnosis not present

## 2017-04-24 DIAGNOSIS — Z6825 Body mass index (BMI) 25.0-25.9, adult: Secondary | ICD-10-CM | POA: Diagnosis not present

## 2017-04-25 ENCOUNTER — Other Ambulatory Visit: Payer: Self-pay | Admitting: Cardiovascular Disease

## 2017-04-25 ENCOUNTER — Telehealth: Payer: Self-pay | Admitting: Cardiovascular Disease

## 2017-04-25 MED ORDER — APIXABAN 5 MG PO TABS: ORAL_TABLET | ORAL | 3 refills | Status: DC

## 2017-04-25 NOTE — Telephone Encounter (Signed)
Refill sent to [facility] 

## 2017-04-25 NOTE — Telephone Encounter (Signed)
°*  STAT* If patient is at the pharmacy, call can be transferred to refill team.   1. Which medications need to be refilled? (please list name of each medication and dose if known)  ELIQUIS 5 MG TABS tablet [[id number]][[id number]]   2. Which pharmacy/location (including street and city if local pharmacy) is medication to be sent to? [facility]  3. Do they need a 30 day or 90 day supply?

## 2017-05-14 ENCOUNTER — Encounter (INDEPENDENT_AMBULATORY_CARE_PROVIDER_SITE_OTHER): Payer: Self-pay | Admitting: Internal Medicine

## 2017-05-14 ENCOUNTER — Ambulatory Visit (INDEPENDENT_AMBULATORY_CARE_PROVIDER_SITE_OTHER): Payer: PPO | Admitting: Internal Medicine

## 2017-05-14 VITALS — BP 100/84 | HR 64 | Temp 97.7°F | Ht 63.0 in | Wt 145.8 lb

## 2017-05-14 DIAGNOSIS — K219 Gastro-esophageal reflux disease without esophagitis: Secondary | ICD-10-CM

## 2017-05-14 NOTE — Patient Instructions (Signed)
Continue the Protonix. OV in 1 year.  

## 2017-05-14 NOTE — Progress Notes (Signed)
Subjective:    Patient ID: [name], female    DOB: 05/21/1928, 84 y.o.   MRN: [id number]  HPI Here today for f/u. Last seen in January of 2018. Hx of GERD. Maintained on Protonix.  She is doing good. Her appetite is good. No weight loss. Has a BM daily. No melena or BRRB. GERD controlled with Protonix.  Hx of atrial fib and maintained on  Eliquis Hx of secondary polycythemia and is [place] by the cancer center in [place].   Treated for H. Pylori in 2014 with Pylera.   11/182014 EGD, ED &Colonoscopy  Indications: Patient is an 84 year old Caucasian female who was found to have iron deficiency anemia when she presented with exertional dyspnea. She has frequent but not daily heartburn and also complains of intermittent solid food dysphagia. She is on low-dose aspirin also takes alendronate every week. She does not take OTC NSAIDs. She is undergoing diagnostic evaluation.  Impression:  EGD findings; Erosive reflux esophagitis with stricture at GE junction which was dilated with a balloon dilator to 18 mm. Moderate size sliding hiatal hernia. Small ulcer at gastric body along with antral erosions.  Colonoscopy findings; Normal colonoscopy except few diverticula at sigmoid colon.  Review of Systems Past Medical History:  Diagnosis Date  . Anemia   . Ascending aortic aneurysm (HCC)    a. 4.6cm by CT 07/2016.  Marland Kitchen. Chronic diastolic CHF (congestive heart failure) (HCC)   . Coronary artery calcification seen on CT scan 07/2016  . History of blood transfusion   . Hypertension   . Hypomagnesemia   . PAF (paroxysmal atrial fibrillation) (HCC)    a. dx during adm 08/2016.  Marland Kitchen. 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 Sepsis (HCC) 08/2016  . Thrombocytopenia (HCC)   . Trigeminal neuralgia     Past Surgical History:  Procedure Laterality  Date  . BREAST CYST EXCISION  60 yrs ago  . COLONOSCOPY WITH ESOPHAGOGASTRODUODENOSCOPY (EGD) N/A 03/10/2013   Procedure: COLONOSCOPY WITH ESOPHAGOGASTRODUODENOSCOPY (EGD);  Surgeon: Malissa HippoNajeeb U Rehman, MD;  [facility];  Service: Endoscopy;  Laterality: N/A;  730  . EXTRACORPOREAL SHOCK WAVE LITHOTRIPSY Right 09/20/2016   Procedure: RIGHT EXTRACORPOREAL SHOCK WAVE LITHOTRIPSY (ESWL);  Surgeon: Alfredo MartinezMacDiarmid, Scott, MD;  Location: [facility];  Service: Urology;  Laterality: Right;  . EYE SURGERY     cataract surgery bilat   . Gamma Knife     Trigeminal   . SLT LASER APPLICATION Left 09/27/2014   Procedure: SLT LASER APPLICATION;  Surgeon: [name], MD;  Location: [facility];  Service: Ophthalmology;  Laterality: Left;    Allergies  Allergen Reactions  . Sulfa Antibiotics Other (See Comments)    Pt states that this med makes her feel crazy.      Current Outpatient Medications on File Prior to Visit  Medication Sig Dispense Refill  . apixaban (ELIQUIS) 5 MG TABS tablet TAKE (1) TABLET BY MOUTH TWICE DAILY. 180 tablet 3  . Ascorbic Acid (VITAMIN C) 1000 MG tablet Take 1,000 mg by mouth daily.    . Calcium Carbonate-Vitamin D (CALCIUM 600+D) 600-400 MG-UNIT tablet Take 1 tablet by mouth daily.    . Cinnamon 500 MG capsule Take 1,000 mg by mouth daily.    . Coenzyme Q10 (COQ10) 100 MG CAPS Take 200 mg by mouth daily.     Marland Kitchen. diltiazem (CARDIZEM CD) 120 MG 24 hr capsule Take 1 capsule (120 mg total) by mouth  daily. 30 capsule 1  . ferrous sulfate 325 (65 FE) MG tablet Take 325 mg by mouth every other day.    . furosemide (LASIX) 20 MG tablet Take 1 tablet (20 mg total) by mouth daily as needed. (Patient taking differently: Take 20 mg by mouth daily. ) 30 tablet 0  . gabapentin (NEURONTIN) 300 MG capsule Take 300 mg by mouth at bedtime.     . metoprolol succinate (TOPROL XL) 25 MG 24 hr tablet Take 1 tablet (25 mg total) by mouth daily. 30 tablet 5  . Multiple Vitamin (MULTIVITAMIN WITH  MINERALS) TABS tablet Take 1 tablet by mouth daily.    . NON FORMULARY Take 250 mg by mouth daily. Keratin    . omega-3 acid ethyl esters (LOVAZA) 1 g capsule Take 1 g by mouth 2 (two) times daily.    . pantoprazole (PROTONIX) 40 MG tablet Take 1 tablet (40 mg total) by mouth daily. 30 tablet 5  . Plant Sterol Stanol-Pantethine 450-75 MG TABS Take 1 tablet by mouth daily.     . pravastatin (PRAVACHOL) 40 MG tablet Take 40 mg by mouth at bedtime.     Marland Kitchen Specialty Vitamins Products (BIOTIN PLUS KERATIN PO) Take 250 mg by mouth.    . timolol (TIMOPTIC) 0.5 % ophthalmic solution Place 1 drop into both eyes daily.    . vitamin E 400 UNIT capsule Take 400 Units by mouth daily.     No current facility-administered medications on file prior to visit.         Objective:   Physical Exam Blood pressure 100/84, pulse 64, temperature 97.7 F (36.5 C), height 5\' 3"  (1.6 m), weight 145 lb 12.8 oz (66.1 kg). Alert and oriented. Skin warm and dry. Oral mucosa is moist.   . Sclera anicteric, conjunctivae is pink. Thyroid not enlarged. No cervical lymphadenopathy. Lungs clear. Heart regular rate and rhythm.  Abdomen is soft. Bowel sounds are positive. No hepatomegaly. No abdominal masses felt. No tenderness.  No edema to lower extremities. .         Assessment & Plan:  GERD. She is doing well.  Continue the Protonix. OV in 1 year.

## 2017-06-04 DIAGNOSIS — Z6825 Body mass index (BMI) 25.0-25.9, adult: Secondary | ICD-10-CM | POA: Diagnosis not present

## 2017-06-04 DIAGNOSIS — N390 Urinary tract infection, site not specified: Secondary | ICD-10-CM | POA: Diagnosis not present

## 2017-06-26 ENCOUNTER — Ambulatory Visit: Payer: PPO | Admitting: Cardiovascular Disease

## 2017-06-26 ENCOUNTER — Encounter: Payer: Self-pay | Admitting: Cardiovascular Disease

## 2017-06-26 VITALS — BP 130/70 | HR 69 | Ht 62.0 in | Wt 148.0 lb

## 2017-06-26 DIAGNOSIS — I5032 Chronic diastolic (congestive) heart failure: Secondary | ICD-10-CM | POA: Diagnosis not present

## 2017-06-26 DIAGNOSIS — R7989 Other specified abnormal findings of blood chemistry: Secondary | ICD-10-CM

## 2017-06-26 DIAGNOSIS — I480 Paroxysmal atrial fibrillation: Secondary | ICD-10-CM | POA: Diagnosis not present

## 2017-06-26 DIAGNOSIS — R778 Other specified abnormalities of plasma proteins: Secondary | ICD-10-CM

## 2017-06-26 DIAGNOSIS — R748 Abnormal levels of other serum enzymes: Secondary | ICD-10-CM | POA: Diagnosis not present

## 2017-06-26 DIAGNOSIS — I10 Essential (primary) hypertension: Secondary | ICD-10-CM

## 2017-06-26 DIAGNOSIS — I2510 Atherosclerotic heart disease of native coronary artery without angina pectoris: Secondary | ICD-10-CM | POA: Diagnosis not present

## 2017-06-26 DIAGNOSIS — I712 Thoracic aortic aneurysm, without rupture: Secondary | ICD-10-CM | POA: Diagnosis not present

## 2017-06-26 DIAGNOSIS — I7121 Aneurysm of the ascending aorta, without rupture: Secondary | ICD-10-CM

## 2017-06-26 NOTE — Patient Instructions (Signed)

## 2017-06-26 NOTE — Progress Notes (Signed)
SUBJECTIVE: The patient presents for follow-up of paroxysmal atrial fibrillation, chronic diastolic heart failure, hypertension, and ascending thoracic aortic aneurysm.  Ascending thoracic aortic aneurysm increased from 4.6-4.9 cm on 01/23/17.  CT also demonstrated a moderate to large hiatal hernia.  Made a referral to CT surgery for additional surveillance monitoring and eventual management.  She saw CT surgery on 02/19/17 who started Toprol-XL 25 mg daily and has scheduled follow-up with her.  She is doing well and denies chest pain, palpitations, orthopnea, and shortness of breath.  She took Lasix on 2 consecutive days last week for some ankle and feet swelling which has since resolved.  She said she likes to stay up late and watch TV.   She is here with her daughter, [name]. [name] has 3 children, a daughter who is an Technical sales engineer in [place], [place], one son who works for a company in [place] who's wife is [name] (marketing at [employer]), and another son, [name], who is a Training and development officer for [employer]. 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   Review of Systems: As per "subjective", otherwise negative.  Allergies  Allergen Reactions  . Sulfa Antibiotics Other (See Comments)    Pt states that this med makes her feel crazy.      Current Outpatient Medications  Medication Sig Dispense Refill  . apixaban (ELIQUIS) 5 MG TABS tablet TAKE (1) TABLET BY MOUTH TWICE DAILY. 180 tablet 3  . Ascorbic Acid (VITAMIN C) 1000 MG tablet Take 1,000 mg by mouth daily.    . Calcium Carbonate-Vitamin D (CALCIUM 600+D) 600-400 MG-UNIT tablet Take 1 tablet by mouth daily.    . Cinnamon 500 MG capsule Take 1,000 mg by mouth daily.    . Coenzyme Q10 (COQ10) 100 MG CAPS Take 200 mg by mouth daily.     Marland Kitchen diltiazem (CARDIZEM CD) 120 MG 24 hr capsule Take 1 capsule (120 mg total) by mouth daily. 30 capsule 1  . ferrous sulfate 325 (65 FE) MG tablet Take 325  mg by mouth every other day.    . furosemide (LASIX) 20 MG tablet Take 1 tablet (20 mg total) by mouth daily as needed. (Patient taking differently: Take 20 mg by mouth daily. ) 30 tablet 0  . gabapentin (NEURONTIN) 300 MG capsule Take 300 mg by mouth at bedtime.     . metoprolol succinate (TOPROL XL) 25 MG 24 hr tablet Take 1 tablet (25 mg total) by mouth daily. 30 tablet 5  . Multiple Vitamin (MULTIVITAMIN WITH MINERALS) TABS tablet Take 1 tablet by mouth daily.    . NON FORMULARY Take 250 mg by mouth daily. Keratin    . omega-3 acid ethyl esters (LOVAZA) 1 g capsule Take 1 g by mouth 2 (two) times daily.    . pantoprazole (PROTONIX) 40 MG tablet Take 1 tablet (40 mg total) by mouth daily. 30 tablet 5  . Plant Sterol Stanol-Pantethine 450-75 MG TABS Take 1 tablet by mouth daily.     . pravastatin (PRAVACHOL) 40 MG tablet Take 40 mg by mouth at bedtime.     Marland Kitchen Specialty Vitamins Products (BIOTIN PLUS KERATIN PO) Take 250 mg by mouth.    . timolol (TIMOPTIC) 0.5 % ophthalmic solution Place 1 drop into both eyes daily.    . vitamin E 400 UNIT capsule Take 400 Units by mouth daily.     No current facility-administered medications for this visit.     Past Medical History:  Diagnosis Date  . Anemia   . Ascending aortic aneurysm (HCC)    a. 4.6cm by CT 07/2016.  Marland Kitchen Chronic diastolic CHF (congestive heart failure) (HCC)   . Coronary artery calcification seen on CT scan 07/2016  . History of blood transfusion   . Hypertension   . Hypomagnesemia   . PAF (paroxysmal atrial fibrillation) (HCC)    a. dx during adm 08/2016.  Marland Kitchen 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   Negative Jak2, BCR/ABL, normal epo level on 02/01/2014  . Sepsis (HCC) 08/2016  . Thrombocytopenia (HCC)   . Trigeminal neuralgia     Past Surgical History:  Procedure Laterality Date  . BREAST CYST EXCISION  60 yrs ago  . COLONOSCOPY WITH ESOPHAGOGASTRODUODENOSCOPY (EGD) N/A 03/10/2013   Procedure: COLONOSCOPY WITH  ESOPHAGOGASTRODUODENOSCOPY (EGD);  Surgeon: Malissa Hippo, MD;  [facility];  Service: Endoscopy;  Laterality: N/A;  730  . EXTRACORPOREAL SHOCK WAVE LITHOTRIPSY Right 09/20/2016   Procedure: RIGHT EXTRACORPOREAL SHOCK WAVE LITHOTRIPSY (ESWL);  Surgeon: Alfredo Martinez, MD;  Location: [facility];  Service: Urology;  Laterality: Right;  . EYE SURGERY     cataract surgery bilat   . Gamma Knife     Trigeminal   . SLT LASER APPLICATION Left 09/27/2014   Procedure: SLT LASER APPLICATION;  Surgeon: Susa Simmonds, MD;  Location: [facility];  Service: Ophthalmology;  Laterality: Left;    Social History   Socioeconomic History  . Marital status: Widowed    Spouse name: Not on file  . Number of children: Not on file  . Years of education: Not on file  . Highest education level: Not on file  Social Needs  . Financial resource strain: Not on file  . Food insecurity - worry: Not on file  . Food insecurity - inability: Not on file  . Transportation needs - medical: Not on file  . Transportation needs - non-medical: Not on file  Occupational History  . Not on file  Tobacco Use  . Smoking status: Never Smoker  . Smokeless tobacco: Never Used  Substance and Sexual Activity  . Alcohol use: No  . Drug use: No  . Sexual activity: No  Other Topics Concern  . Not on file  Social History Narrative  . Not on file     Vitals:   06/26/17 0857  BP: 130/70  Pulse: 69  SpO2: 96%  Weight: 148 lb (67.1 kg)  Height: 5\' 2"  (1.575 m)    Wt Readings from Last 3 Encounters:  06/26/17 148 lb (67.1 kg)  05/14/17 145 lb 12.8 oz (66.1 kg)  02/19/17 143 lb (64.9 kg)     PHYSICAL EXAM General: NAD HEENT: Normal. Neck: No JVD, no thyromegaly. Lungs: Clear to auscultation bilaterally with normal respiratory effort. CV: Regular rate and rhythm, normal S1/S2, no S3/S4, no murmur. No pretibial or periankle edema.  No carotid bruit.   Abdomen: Soft, nontender, no distention.  Neurologic:  Alert and oriented.  Psych: Normal affect. Skin: Normal. Musculoskeletal: No gross deformities.    ECG: Most recent ECG reviewed.   Labs: Lab Results  Component Value Date/Time   K 3.6 09/12/2016 03:33 PM   BUN 18 01/22/2017 08:27 AM   CREATININE 0.81 01/22/2017 08:27 AM   ALT 37 08/21/2016 06:52 AM   TSH 2.377 08/24/2016 05:40 AM   HGB 15.7 (H) 09/12/2016 03:33 PM     Lipids: No results found for: LDLCALC, LDLDIRECT, CHOL, TRIG, HDL     ASSESSMENT AND PLAN: 1. Paroxysmal  atrial fibrillation: Symptomatically stable. Continue Eliquis for anticoagulation. Continue long-acting diltiazem 120 mg daily.  She is also on Toprol-XL 25 mg daily.  2. Elevated troponin level/demand ischemia/coronary artery calcifications seen on CT: Asymptomatic. No indication for stress testing at this time.  3. Chronic diastolic heart failure: Euvolemic. Continue Lasix 20 mg daily.  4. Ascending thoracic aortic aneurysm: Repeat CTA in October 2018 demonstrated increased to 4.9 cm from 4.6 cm.  She was started on Toprol-XL 25 mg daily by CT surgery who is now monitoring her aneurysm.  5.  Hypertension: Controlled on present therapy.  No changes.    Disposition: Follow up 6 months   Prentice DockerSuresh Deriona Altemose, M.D., F.A.C.C.

## 2017-08-08 ENCOUNTER — Other Ambulatory Visit: Payer: Self-pay | Admitting: Thoracic Surgery (Cardiothoracic Vascular Surgery)

## 2017-08-08 DIAGNOSIS — I712 Thoracic aortic aneurysm, without rupture, unspecified: Secondary | ICD-10-CM

## 2017-08-19 DIAGNOSIS — H35371 Puckering of macula, right eye: Secondary | ICD-10-CM | POA: Diagnosis not present

## 2017-08-19 DIAGNOSIS — H40013 Open angle with borderline findings, low risk, bilateral: Secondary | ICD-10-CM | POA: Diagnosis not present

## 2017-08-19 DIAGNOSIS — H16221 Keratoconjunctivitis sicca, not specified as Sjogren's, right eye: Secondary | ICD-10-CM | POA: Diagnosis not present

## 2017-08-19 DIAGNOSIS — Z961 Presence of intraocular lens: Secondary | ICD-10-CM | POA: Diagnosis not present

## 2017-08-19 DIAGNOSIS — H1851 Endothelial corneal dystrophy: Secondary | ICD-10-CM | POA: Diagnosis not present

## 2017-08-19 DIAGNOSIS — H25812 Combined forms of age-related cataract, left eye: Secondary | ICD-10-CM | POA: Diagnosis not present

## 2017-09-10 ENCOUNTER — Other Ambulatory Visit: Payer: Self-pay

## 2017-09-10 ENCOUNTER — Ambulatory Visit: Payer: PPO | Admitting: Thoracic Surgery (Cardiothoracic Vascular Surgery)

## 2017-09-10 ENCOUNTER — Encounter: Payer: Self-pay | Admitting: Thoracic Surgery (Cardiothoracic Vascular Surgery)

## 2017-09-10 ENCOUNTER — Ambulatory Visit
Admission: RE | Admit: 2017-09-10 | Discharge: 2017-09-10 | Disposition: A | Discharge status: Home / Self Care | Payer: PPO | Source: Ambulatory Visit | Attending: Thoracic Surgery (Cardiothoracic Vascular Surgery) | Admitting: Thoracic Surgery (Cardiothoracic Vascular Surgery)

## 2017-09-10 VITALS — BP 159/92 | HR 86 | Resp 16 | Ht 62.0 in | Wt 146.0 lb

## 2017-09-10 DIAGNOSIS — I7121 Aneurysm of the ascending aorta, without rupture: Secondary | ICD-10-CM

## 2017-09-10 DIAGNOSIS — I712 Thoracic aortic aneurysm, without rupture, unspecified: Secondary | ICD-10-CM

## 2017-09-10 MED ORDER — IOPAMIDOL (ISOVUE-370) INJECTION 76%
75.0000 mL | Freq: Once | INTRAVENOUS | Status: AC | PRN
  Administered 2017-09-10: 75 mL via INTRAVENOUS

## 2017-09-10 NOTE — Progress Notes (Signed)
[address]       [name] [address]             [phone]     HPI: [name] returns for a scheduled follow-up visit  [name] is an 84 year old woman with a history of coronary artery disease, chronic diastolic congestive heart failure, polycythemia, hypertension, paroxysmal atrial fibrillation, trigeminal neuralgia, thrombocytopenia entheses resolved), and ascending aneurysm first noted in 2018.  I saw her in October 2018 for the ascending aneurysm.  It measured 4.9 cm on CT at that time.  In the interim since her last visit she is been feeling well.  She says she has not had any problems recently and has not had any hospital admissions.  She denies chest pain and shortness of breath.  Past Medical History:  Diagnosis Date  . Anemia   . Ascending aortic aneurysm (HCC)    a. 4.6cm by CT 07/2016.  Marland Kitchen Chronic diastolic CHF (congestive heart failure) (HCC)   . Coronary artery calcification seen on CT scan 07/2016  . History of blood transfusion   . Hypertension   . Hypomagnesemia   . PAF (paroxysmal atrial fibrillation) (HCC)    a. dx during adm 08/2016.  Marland Kitchen 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   Negative Jak2, BCR/ABL, normal epo level on 02/01/2014  . Sepsis (HCC) 08/2016  . Thrombocytopenia (HCC)   . Trigeminal neuralgia     Current Outpatient Medications  Medication Sig Dispense Refill  . apixaban (ELIQUIS) 5 MG TABS tablet TAKE (1) TABLET BY MOUTH TWICE DAILY. 180 tablet 3  . Ascorbic Acid (VITAMIN C) 1000 MG tablet Take 1,000 mg by mouth daily.    . Calcium Carbonate-Vitamin D (CALCIUM 600+D) 600-400 MG-UNIT tablet Take 1 tablet by mouth daily.    . Cinnamon 500 MG capsule Take 1,000 mg by mouth daily.    . Coenzyme Q10 (COQ10) 100 MG CAPS Take 200 mg by mouth daily.     Marland Kitchen diltiazem (CARDIZEM CD) 120 MG 24 hr capsule Take 1 capsule (120 mg total) by mouth daily. 30 capsule 1  . ferrous sulfate 325 (65 FE) MG tablet Take 325 mg by mouth every  other day.    . furosemide (LASIX) 20 MG tablet Take 1 tablet (20 mg total) by mouth daily as needed. (Patient taking differently: Take 20 mg by mouth daily. ) 30 tablet 0  . gabapentin (NEURONTIN) 300 MG capsule Take 300 mg by mouth at bedtime.     . metoprolol succinate (TOPROL XL) 25 MG 24 hr tablet Take 1 tablet (25 mg total) by mouth daily. 30 tablet 5  . Multiple Vitamin (MULTIVITAMIN WITH MINERALS) TABS tablet Take 1 tablet by mouth daily.    . NON FORMULARY Take 250 mg by mouth daily. Keratin    . omega-3 acid ethyl esters (LOVAZA) 1 g capsule Take 1 g by mouth 2 (two) times daily.    . pantoprazole (PROTONIX) 40 MG tablet Take 1 tablet (40 mg total) by mouth daily. 30 tablet 5  . Plant Sterol Stanol-Pantethine 450-75 MG TABS Take 1 tablet by mouth daily.     . pravastatin (PRAVACHOL) 40 MG tablet Take 40 mg by mouth at bedtime.     Marland Kitchen Specialty Vitamins Products (BIOTIN PLUS KERATIN PO) Take 250 mg by mouth.    . timolol (TIMOPTIC) 0.5 % ophthalmic solution Place 1 drop into both eyes daily.    . vitamin E 400 UNIT capsule Take 400 Units by  mouth daily.     No current facility-administered medications for this visit.     Physical Exam BP (!) 159/92 (BP Location: Right Arm, Patient Position: Sitting, Cuff Size: Large)   Pulse 86   Resp 16   Ht  (1.575 m)   Wt 146 lb (66.2 kg)   SpO2 94% Comment: ON RA  BMI 26.73 kg/m  84 year old woman in no acute distress Alert and oriented x3 with no focal deficits Kyphoscoliosis Cardiac regular rate and rhythm with no murmur No carotid bruits Lungs clear breath sounds bilaterally  Diagnostic Tests: CT ANGIOGRAPHY CHEST WITH CONTRAST  TECHNIQUE: Multidetector CT imaging of the chest was performed using the standard protocol during bolus administration of intravenous contrast. Multiplanar CT image reconstructions and MIPs were obtained to evaluate the vascular anatomy.  Creatinine was obtained on site at [facility] at  [address]  Results: Creatinine 0.8 mg/dL.  CONTRAST:  75mL ISOVUE-370 IOPAMIDOL (ISOVUE-370) INJECTION 76%  COMPARISON:  Prior CT of the chest 01/23/2017  FINDINGS: Cardiovascular: Conventional 3 vessel arch anatomy. Aneurysmal dilatation of the tubular portion of the ascending thoracic aorta is again noted. The maximal transverse diameter is 4.9 cm, unchanged compared to prior. The aortic root remains normal in caliber. There is no effacement of the sino-tubular junction. The transverse and descending thoracic aorta are normal in caliber. No evidence of dissection. The branch vessels are widely patent but highly tortuous. Minimal atherosclerotic plaque is present. Normal caliber main and central pulmonary arteries. No evidence of pulmonary embolus. Mild right atrial dilatation. The heart is otherwise normal in size. No pericardial effusion.  Mediastinum/Nodes: Moderately large sliding hiatal hernia. No suspicious mediastinal or hilar lymphadenopathy. Normal appearance of the thyroid gland.  Lungs/Pleura: Relatively low inspiratory volumes with chronic elevation of the right hemidiaphragm and chronic atelectasis in the inferior aspect of the right middle lobe. Scattered areas of patchy ground-glass attenuation airspace opacity are present bilaterally and favored to reflect subsegmental atelectasis in the setting of under ventilation. No pneumothorax or pleural effusion.  Upper Abdomen: Visualized upper abdominal organs are unremarkable.  Musculoskeletal: No acute fracture or aggressive appearing lytic or blastic osseous lesion.  Review of the MIP images confirms the above findings.  IMPRESSION: 1. Stable fusiform aneurysmal dilatation of the tubular portion of the ascending thoracic aorta with a maximal diameter of 4.9 cm. Recommend semi-annual imaging followup by CTA or MRA and referral to cardiothoracic surgery if not already obtained. This  recommendation follows 2010 ACCF/AHA/AATS/ACR/ASA/SCA/SCAI/SIR/STS/SVM Guidelines for the Diagnosis and Management of Patients With Thoracic Aortic Disease. Circulation. 2010; 121: Z610-R604 2. Moderately large sliding hiatal hernia. 3. Relative hypoventilation with scattered areas of subsegmental atelectasis.  Aortic aneurysm NOS (ICD10-I71.9); Aortic Atherosclerosis (ICD10-170.0)  Signed,  Sterling Big, MD  [facility]  [facility]   Electronically Signed   By: Malachy Moan M.D.   On: 09/10/2017 09:09 I personally reviewed the CT images and concur with the findings noted above.  Impression: [name] is an 84 year old woman who is being followed for a 4.9 cm ascending aneurysm.  This is unchanged over the past 6 months.  There is no indication for surgical intervention at this time.  She does have risk of dissection or rupture, but the surgical risk outweighs that at this point.  She needs continued follow-up at six-month intervals.  Hypertension-poorly controlled today.  She says that when she saw Dr. Margo Aye and at her last GYN appointment her blood pressure was less  than 140.  She says that she was very nervous about the CT scan and seeing me today.  I suspect there is an element of whitecoat hypertension with seeing me about this particular problem today.  I advised her to check her blood pressure at home on a regular basis.  She can follow-up with Dr. Margo Aye regarding her blood pressure if it is elevated.  Plan: Return in 6 months with CT injury  Loreli Slot, MD [facility] [phone]

## 2017-09-20 ENCOUNTER — Other Ambulatory Visit (INDEPENDENT_AMBULATORY_CARE_PROVIDER_SITE_OTHER): Payer: Self-pay | Admitting: Internal Medicine

## 2017-10-17 ENCOUNTER — Other Ambulatory Visit: Payer: Self-pay

## 2017-10-17 DIAGNOSIS — I4891 Unspecified atrial fibrillation: Secondary | ICD-10-CM

## 2017-10-17 MED ORDER — METOPROLOL SUCCINATE ER 25 MG PO TB24: 25.0000 mg | ORAL_TABLET | Freq: Every day | ORAL | 6 refills | Status: DC

## 2017-10-28 DIAGNOSIS — D509 Iron deficiency anemia, unspecified: Secondary | ICD-10-CM | POA: Diagnosis not present

## 2017-10-28 DIAGNOSIS — I10 Essential (primary) hypertension: Secondary | ICD-10-CM | POA: Diagnosis not present

## 2017-10-28 DIAGNOSIS — E782 Mixed hyperlipidemia: Secondary | ICD-10-CM | POA: Diagnosis not present

## 2017-10-28 DIAGNOSIS — Z0000 Encounter for general adult medical examination without abnormal findings: Secondary | ICD-10-CM | POA: Diagnosis not present

## 2017-10-28 DIAGNOSIS — I482 Chronic atrial fibrillation: Secondary | ICD-10-CM | POA: Diagnosis not present

## 2017-10-31 ENCOUNTER — Telehealth: Payer: Self-pay | Admitting: Cardiovascular Disease

## 2017-10-31 NOTE — Telephone Encounter (Signed)
Pt notified and voiced understanding 

## 2017-10-31 NOTE — Telephone Encounter (Signed)
None

## 2017-10-31 NOTE — Telephone Encounter (Signed)
Pt is going to have a toenail removed on 12/02/17--wants to know what meds she needs to hold prior. Dr. Allena KatzPatel will be doing it.

## 2017-11-04 DIAGNOSIS — Z6825 Body mass index (BMI) 25.0-25.9, adult: Secondary | ICD-10-CM | POA: Diagnosis not present

## 2017-11-04 DIAGNOSIS — D509 Iron deficiency anemia, unspecified: Secondary | ICD-10-CM | POA: Diagnosis not present

## 2017-11-04 DIAGNOSIS — K219 Gastro-esophageal reflux disease without esophagitis: Secondary | ICD-10-CM | POA: Diagnosis not present

## 2017-11-04 DIAGNOSIS — I482 Chronic atrial fibrillation: Secondary | ICD-10-CM | POA: Diagnosis not present

## 2017-11-04 DIAGNOSIS — E782 Mixed hyperlipidemia: Secondary | ICD-10-CM | POA: Diagnosis not present

## 2017-11-05 ENCOUNTER — Other Ambulatory Visit: Payer: Self-pay | Admitting: Urology

## 2017-11-05 ENCOUNTER — Ambulatory Visit: Payer: PPO | Admitting: Urology

## 2017-11-05 ENCOUNTER — Ambulatory Visit (HOSPITAL_COMMUNITY)
Admission: RE | Admit: 2017-11-05 | Discharge: 2017-11-05 | Disposition: A | Discharge status: Home / Self Care | Payer: PPO | Source: Ambulatory Visit | Attending: Urology | Admitting: Urology

## 2017-11-05 DIAGNOSIS — N200 Calculus of kidney: Secondary | ICD-10-CM | POA: Diagnosis not present

## 2017-11-05 DIAGNOSIS — N201 Calculus of ureter: Secondary | ICD-10-CM | POA: Diagnosis not present

## 2017-11-18 ENCOUNTER — Telehealth: Payer: Self-pay | Admitting: Orthopedic Surgery

## 2017-11-18 NOTE — Telephone Encounter (Signed)
Patient called and wanted an appointment with Dr. Romeo AppleHarrison.  I explained to her that Dr. Romeo AppleHarrison was out of the office for the next 2 weeks and it would possibly a week or so after that before I could get her on the schedule.  I suggested that maybe she could go ahead and see her PCP and get an x-ray or I would be more than happy to schedule her with Dr. Hilda LiasKeeling.  She said she thought she would wait and see when she could get in with her PCP, Dr. Margo AyeHall.  I told her to let me know if she changed her mind.

## 2017-12-02 DIAGNOSIS — B351 Tinea unguium: Secondary | ICD-10-CM | POA: Diagnosis not present

## 2017-12-02 DIAGNOSIS — M79674 Pain in right toe(s): Secondary | ICD-10-CM | POA: Diagnosis not present

## 2017-12-02 DIAGNOSIS — I739 Peripheral vascular disease, unspecified: Secondary | ICD-10-CM | POA: Diagnosis not present

## 2017-12-02 DIAGNOSIS — M79675 Pain in left toe(s): Secondary | ICD-10-CM | POA: Diagnosis not present

## 2017-12-19 DIAGNOSIS — Z23 Encounter for immunization: Secondary | ICD-10-CM | POA: Diagnosis not present

## 2017-12-19 DIAGNOSIS — E782 Mixed hyperlipidemia: Secondary | ICD-10-CM | POA: Diagnosis not present

## 2017-12-19 DIAGNOSIS — D509 Iron deficiency anemia, unspecified: Secondary | ICD-10-CM | POA: Diagnosis not present

## 2017-12-19 DIAGNOSIS — I10 Essential (primary) hypertension: Secondary | ICD-10-CM | POA: Diagnosis not present

## 2017-12-19 DIAGNOSIS — K219 Gastro-esophageal reflux disease without esophagitis: Secondary | ICD-10-CM | POA: Diagnosis not present

## 2017-12-19 DIAGNOSIS — G500 Trigeminal neuralgia: Secondary | ICD-10-CM | POA: Diagnosis not present

## 2017-12-19 DIAGNOSIS — Z6825 Body mass index (BMI) 25.0-25.9, adult: Secondary | ICD-10-CM | POA: Diagnosis not present

## 2017-12-19 DIAGNOSIS — N390 Urinary tract infection, site not specified: Secondary | ICD-10-CM | POA: Diagnosis not present

## 2017-12-19 DIAGNOSIS — Z0000 Encounter for general adult medical examination without abnormal findings: Secondary | ICD-10-CM | POA: Diagnosis not present

## 2017-12-19 DIAGNOSIS — N209 Urinary calculus, unspecified: Secondary | ICD-10-CM | POA: Diagnosis not present

## 2017-12-19 DIAGNOSIS — I482 Chronic atrial fibrillation: Secondary | ICD-10-CM | POA: Diagnosis not present

## 2017-12-25 DIAGNOSIS — M545 Low back pain: Secondary | ICD-10-CM | POA: Diagnosis not present

## 2017-12-25 DIAGNOSIS — M25562 Pain in left knee: Secondary | ICD-10-CM | POA: Diagnosis not present

## 2017-12-25 DIAGNOSIS — M25552 Pain in left hip: Secondary | ICD-10-CM | POA: Diagnosis not present

## 2017-12-25 DIAGNOSIS — Z6825 Body mass index (BMI) 25.0-25.9, adult: Secondary | ICD-10-CM | POA: Diagnosis not present

## 2018-01-06 ENCOUNTER — Encounter: Payer: Self-pay | Admitting: Orthopedic Surgery

## 2018-01-06 ENCOUNTER — Ambulatory Visit (INDEPENDENT_AMBULATORY_CARE_PROVIDER_SITE_OTHER): Payer: PPO

## 2018-01-06 ENCOUNTER — Ambulatory Visit: Payer: PPO | Admitting: Orthopedic Surgery

## 2018-01-06 VITALS — BP 149/89 | HR 73 | Ht 63.0 in | Wt 148.0 lb

## 2018-01-06 DIAGNOSIS — M419 Scoliosis, unspecified: Secondary | ICD-10-CM | POA: Diagnosis not present

## 2018-01-06 DIAGNOSIS — M4150 Other secondary scoliosis, site unspecified: Principal | ICD-10-CM

## 2018-01-06 DIAGNOSIS — M79605 Pain in left leg: Secondary | ICD-10-CM

## 2018-01-06 NOTE — Progress Notes (Signed)
NEW PATIENT OFFICE VISIT  Chief Complaint  Patient presents with  . Leg Pain    left      84 year old female presents to us for evaluation of left leg pain x1 month  She has a history of heart aneurysm she is on Eliquis.  She presents with pain in her left hip occasionally also left knee and left leg which is described as a deep dull ache associated with some mild back pain and is severe at night moderate during the day.  She is already on gabapentin 300 mg nightly she received an intramuscular injection 2 weeks ago did not relieve her symptoms     Review of Systems  Constitutional: Negative for fever, malaise/fatigue and weight loss.  Respiratory: Positive for shortness of breath.   Musculoskeletal: Positive for back pain.     Past Medical History:  Diagnosis Date  . Anemia   . Ascending aortic aneurysm (HCC)    a. 4.6cm by CT 07/2016.  Marland Kitchen. Chronic diastolic CHF (congestive heart failure) (HCC)   . Coronary artery calcification seen on CT scan 07/2016  . History of blood transfusion   . Hypertension   . Hypomagnesemia   . PAF (paroxysmal atrial fibrillation) (HCC)    a. dx during adm 08/2016.  Marland Kitchen. 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 Sepsis (HCC) 08/2016  . Thrombocytopenia (HCC)   . Trigeminal neuralgia     Past Surgical History:  Procedure Laterality Date  . BREAST CYST EXCISION  60 yrs ago  . COLONOSCOPY WITH ESOPHAGOGASTRODUODENOSCOPY (EGD) N/A 03/10/2013   Procedure: COLONOSCOPY WITH ESOPHAGOGASTRODUODENOSCOPY (EGD);  Surgeon: Malissa HippoNajeeb U Rehman, MD;  Location: [facility];  Service: Endoscopy;  Laterality: N/A;  730  . EXTRACORPOREAL SHOCK WAVE LITHOTRIPSY Right 09/20/2016   Procedure: RIGHT EXTRACORPOREAL SHOCK WAVE LITHOTRIPSY (ESWL);  Surgeon: Alfredo MartinezMacDiarmid, Scott, MD;  Location: [facility];  Service: Urology;  Laterality: Right;  . EYE SURGERY     cataract surgery bilat   . Gamma Knife     Trigeminal   . SLT  LASER APPLICATION Left 09/27/2014   Procedure: SLT LASER APPLICATION;  Surgeon: [name], MD;  Location: [facility];  Service: Ophthalmology;  Laterality: Left;    Family History  Problem Relation Age of Onset  . Congestive Heart Failure Mother   . Heart attack Father   . CAD Neg Hx    Social History   Tobacco Use  . Smoking status: Never Smoker  . Smokeless tobacco: Never Used  Substance Use Topics  . Alcohol use: No  . Drug use: No    Allergies  Allergen Reactions  . Sulfa Antibiotics Other (See Comments)    Pt states that this med makes her feel crazy.      Current Meds  Medication Sig  . apixaban (ELIQUIS) 5 MG TABS tablet TAKE (1) TABLET BY MOUTH TWICE DAILY.  Marland Kitchen. Ascorbic Acid (VITAMIN C) 1000 MG tablet Take 1,000 mg by mouth daily.  . Calcium Carbonate-Vitamin D (CALCIUM 600+D) 600-400 MG-UNIT tablet Take 1 tablet by mouth daily.  . Cinnamon 500 MG capsule Take 1,000 mg by mouth daily.  . Coenzyme Q10 (COQ10) 100 MG CAPS Take 200 mg by mouth daily.   Marland Kitchen. diltiazem (CARDIZEM CD) 120 MG 24 hr capsule Take 1 capsule (120 mg total) by mouth daily.  . ferrous sulfate 325 (65 FE) MG tablet Take 325 mg by mouth every other day.  . furosemide (LASIX) 20 MG  tablet Take 1 tablet (20 mg total) by mouth daily as needed. (Patient taking differently: Take 20 mg by mouth daily. )  . gabapentin (NEURONTIN) 300 MG capsule Take 300 mg by mouth at bedtime.   . metoprolol succinate (TOPROL XL) 25 MG 24 hr tablet Take 1 tablet (25 mg total) by mouth daily.  . Multiple Vitamin (MULTIVITAMIN WITH MINERALS) TABS tablet Take 1 tablet by mouth daily.  . NON FORMULARY Take 250 mg by mouth daily. Keratin  . omega-3 acid ethyl esters (LOVAZA) 1 g capsule Take 1 g by mouth 2 (two) times daily.  . pantoprazole (PROTONIX) 40 MG tablet TAKE (1) TABLET BY MOUTH ONCE DAILY.  Marland Kitchen Plant Sterol Stanol-Pantethine 450-75 MG TABS Take 1 tablet by mouth daily.   . pravastatin (PRAVACHOL) 40 MG tablet Take 40 mg  by mouth at bedtime.   Marland Kitchen Specialty Vitamins Products (BIOTIN PLUS KERATIN PO) Take 250 mg by mouth.  . timolol (TIMOPTIC) 0.5 % ophthalmic solution Place 1 drop into both eyes daily.  . vitamin E 400 UNIT capsule Take 400 Units by mouth daily.    BP (!) 149/89   Pulse 73   Ht 5\' 3"  (1.6 m)   Wt 148 lb (67.1 kg)   BMI 26.22 kg/m    Physical Exam  Constitutional: She is oriented to person, place, and time. She appears well-developed and well-nourished.  Neurological: She is alert and oriented to person, place, and time.  Psychiatric: She has a normal mood and affect. Judgment normal.  Vitals reviewed.   Ortho Exam Patient has a significant scoliosis in the thoracolumbar spine hyperextension at the neck which is compensatory for gaze  She has tenderness in the left side of her lower back decreased flexion extension rotation no instability muscle tone normal skin is intact without any pathologic lesions.  Right and left leg pulse and temperature normal mild edema bilaterally.  Sensory exam is normal with intact reflexes which are equal at the knee and ankle 2 and 1+ respectively without pathologic reflexes or reactive straight leg raise  Overall coordination and balance are normal.  X-rays were taken in the office she has degenerative scoliosis in the lumbar region with facet arthrosis and spondylolisthesis at L4-5  Encounter Diagnoses  Name Primary?  . Pain of left lower extremity   . Degenerative scoliosis Yes    Recommend physical therapy   MEDICAL DECISION SECTION  Xrays were done at [facility]  My independent reading of xrays:  As above  Encounter Diagnoses  Name Primary?  . Pain of left lower extremity   . Degenerative scoliosis Yes    PLAN: (Rx., injectx, surgery, frx, mri/ct) PT return 2 months  No orders of the defined types were placed in this encounter.   Fuller Canada, MD  01/06/2018 10:08 AM

## 2018-01-06 NOTE — Patient Instructions (Signed)
Scoliosis Scoliosis is the name given to a spine that curves sideways.Scoliosis can cause twisting of your shoulders, hips, chest, back, and rib cage. What are the causes? The cause of scoliosis is not always known. It may be caused by a birth defect or by a disease that can cause muscular dysfunction and imbalance, such as cerebral palsy and muscular dystrophy. What increases the risk? Having a disease that causes muscle disease or dysfunction. What are the signs or symptoms? Scoliosis often has no signs or symptoms.If they are present, they may include:  Unequal size of one body side compared to the other (asymmetry).  Visible curvature of the spine.  Pain. The pain may limit physical activity.  Shortness of breath.  Bowel or bladder issues.  How is this diagnosed? A skilled health care provider will perform an evaluation. This will involve:  Taking your history.  Performing a physical examination.  Performing a neurological exam to detect nerve or muscle function loss.  Range of motion studies on the spine.  X-rays.  An MRI may also be obtained. How is this treated? Treatment varies depending on the nature, extent, and severity of the disease. If the curvature is not great, you may need only observation. A brace may be used to prevent scoliosis from progressing. A brace may also be needed during growth spurts. Physical therapy may be of benefit. Surgery may be required. Follow these instructions at home:  Your health care provider may suggest exercises to strengthen your muscles. Perform them as directed.  Ask your health care provider before participating in any sports.  If you have been prescribed an orthopedic brace, wear it as instructed by your health care provider. Contact a health care provider if: Your brace causes the skin to become sore (chafe) or is uncomfortable. Get help right away if:  You have back pain that is not relieved by the medicines prescribed  by your health care provider.  Your legs feel weak or you lose function in your legs.  You lose some bowel or bladder control. This information is not intended to replace advice given to you by your health care provider. Make sure you discuss any questions you have with your health care provider. Document Released: 04/06/2000 Document Revised: 09/15/2015 Document Reviewed: 10/13/2015 Elsevier Interactive Patient Education  2018 [employer]  

## 2018-01-13 ENCOUNTER — Telehealth: Payer: Self-pay | Admitting: Orthopedic Surgery

## 2018-01-13 NOTE — Telephone Encounter (Signed)
Patient states she needs PT order sent to [facility] in [place]

## 2018-01-13 NOTE — Telephone Encounter (Signed)
Faxed again, thank you

## 2018-01-14 DIAGNOSIS — R2689 Other abnormalities of gait and mobility: Secondary | ICD-10-CM | POA: Diagnosis not present

## 2018-01-14 DIAGNOSIS — M79605 Pain in left leg: Secondary | ICD-10-CM | POA: Diagnosis not present

## 2018-01-14 DIAGNOSIS — M4156 Other secondary scoliosis, lumbar region: Secondary | ICD-10-CM | POA: Diagnosis not present

## 2018-01-14 DIAGNOSIS — M6281 Muscle weakness (generalized): Secondary | ICD-10-CM | POA: Diagnosis not present

## 2018-01-16 DIAGNOSIS — R2689 Other abnormalities of gait and mobility: Secondary | ICD-10-CM | POA: Diagnosis not present

## 2018-01-16 DIAGNOSIS — M4156 Other secondary scoliosis, lumbar region: Secondary | ICD-10-CM | POA: Diagnosis not present

## 2018-01-16 DIAGNOSIS — M79605 Pain in left leg: Secondary | ICD-10-CM | POA: Diagnosis not present

## 2018-01-16 DIAGNOSIS — M6281 Muscle weakness (generalized): Secondary | ICD-10-CM | POA: Diagnosis not present

## 2018-01-20 DIAGNOSIS — M4156 Other secondary scoliosis, lumbar region: Secondary | ICD-10-CM | POA: Diagnosis not present

## 2018-01-20 DIAGNOSIS — M6281 Muscle weakness (generalized): Secondary | ICD-10-CM | POA: Diagnosis not present

## 2018-01-20 DIAGNOSIS — R2689 Other abnormalities of gait and mobility: Secondary | ICD-10-CM | POA: Diagnosis not present

## 2018-01-20 DIAGNOSIS — M79605 Pain in left leg: Secondary | ICD-10-CM | POA: Diagnosis not present

## 2018-01-22 DIAGNOSIS — R2689 Other abnormalities of gait and mobility: Secondary | ICD-10-CM | POA: Diagnosis not present

## 2018-01-22 DIAGNOSIS — M4156 Other secondary scoliosis, lumbar region: Secondary | ICD-10-CM | POA: Diagnosis not present

## 2018-01-22 DIAGNOSIS — M6281 Muscle weakness (generalized): Secondary | ICD-10-CM | POA: Diagnosis not present

## 2018-01-22 DIAGNOSIS — M79605 Pain in left leg: Secondary | ICD-10-CM | POA: Diagnosis not present

## 2018-01-24 ENCOUNTER — Other Ambulatory Visit: Payer: Self-pay | Admitting: *Deleted

## 2018-01-24 DIAGNOSIS — I712 Thoracic aortic aneurysm, without rupture, unspecified: Secondary | ICD-10-CM

## 2018-01-24 DIAGNOSIS — M4156 Other secondary scoliosis, lumbar region: Secondary | ICD-10-CM | POA: Diagnosis not present

## 2018-01-24 DIAGNOSIS — M6281 Muscle weakness (generalized): Secondary | ICD-10-CM | POA: Diagnosis not present

## 2018-01-24 DIAGNOSIS — M79605 Pain in left leg: Secondary | ICD-10-CM | POA: Diagnosis not present

## 2018-01-24 DIAGNOSIS — R2689 Other abnormalities of gait and mobility: Secondary | ICD-10-CM | POA: Diagnosis not present

## 2018-01-27 DIAGNOSIS — M4156 Other secondary scoliosis, lumbar region: Secondary | ICD-10-CM | POA: Diagnosis not present

## 2018-01-27 DIAGNOSIS — R2689 Other abnormalities of gait and mobility: Secondary | ICD-10-CM | POA: Diagnosis not present

## 2018-01-27 DIAGNOSIS — M6281 Muscle weakness (generalized): Secondary | ICD-10-CM | POA: Diagnosis not present

## 2018-01-27 DIAGNOSIS — M79605 Pain in left leg: Secondary | ICD-10-CM | POA: Diagnosis not present

## 2018-01-29 DIAGNOSIS — M6281 Muscle weakness (generalized): Secondary | ICD-10-CM | POA: Diagnosis not present

## 2018-01-29 DIAGNOSIS — M79605 Pain in left leg: Secondary | ICD-10-CM | POA: Diagnosis not present

## 2018-01-29 DIAGNOSIS — R2689 Other abnormalities of gait and mobility: Secondary | ICD-10-CM | POA: Diagnosis not present

## 2018-01-29 DIAGNOSIS — M4156 Other secondary scoliosis, lumbar region: Secondary | ICD-10-CM | POA: Diagnosis not present

## 2018-01-30 DIAGNOSIS — Z23 Encounter for immunization: Secondary | ICD-10-CM | POA: Diagnosis not present

## 2018-01-31 DIAGNOSIS — M79605 Pain in left leg: Secondary | ICD-10-CM | POA: Diagnosis not present

## 2018-01-31 DIAGNOSIS — M4156 Other secondary scoliosis, lumbar region: Secondary | ICD-10-CM | POA: Diagnosis not present

## 2018-01-31 DIAGNOSIS — M6281 Muscle weakness (generalized): Secondary | ICD-10-CM | POA: Diagnosis not present

## 2018-01-31 DIAGNOSIS — R2689 Other abnormalities of gait and mobility: Secondary | ICD-10-CM | POA: Diagnosis not present

## 2018-02-03 DIAGNOSIS — M79605 Pain in left leg: Secondary | ICD-10-CM | POA: Diagnosis not present

## 2018-02-03 DIAGNOSIS — M6281 Muscle weakness (generalized): Secondary | ICD-10-CM | POA: Diagnosis not present

## 2018-02-03 DIAGNOSIS — R2689 Other abnormalities of gait and mobility: Secondary | ICD-10-CM | POA: Diagnosis not present

## 2018-02-03 DIAGNOSIS — M4156 Other secondary scoliosis, lumbar region: Secondary | ICD-10-CM | POA: Diagnosis not present

## 2018-02-05 DIAGNOSIS — R2689 Other abnormalities of gait and mobility: Secondary | ICD-10-CM | POA: Diagnosis not present

## 2018-02-05 DIAGNOSIS — M6281 Muscle weakness (generalized): Secondary | ICD-10-CM | POA: Diagnosis not present

## 2018-02-05 DIAGNOSIS — M79605 Pain in left leg: Secondary | ICD-10-CM | POA: Diagnosis not present

## 2018-02-05 DIAGNOSIS — M4156 Other secondary scoliosis, lumbar region: Secondary | ICD-10-CM | POA: Diagnosis not present

## 2018-02-07 DIAGNOSIS — M79605 Pain in left leg: Secondary | ICD-10-CM | POA: Diagnosis not present

## 2018-02-07 DIAGNOSIS — R2689 Other abnormalities of gait and mobility: Secondary | ICD-10-CM | POA: Diagnosis not present

## 2018-02-07 DIAGNOSIS — M6281 Muscle weakness (generalized): Secondary | ICD-10-CM | POA: Diagnosis not present

## 2018-02-07 DIAGNOSIS — M4156 Other secondary scoliosis, lumbar region: Secondary | ICD-10-CM | POA: Diagnosis not present

## 2018-02-10 DIAGNOSIS — R2689 Other abnormalities of gait and mobility: Secondary | ICD-10-CM | POA: Diagnosis not present

## 2018-02-10 DIAGNOSIS — M6281 Muscle weakness (generalized): Secondary | ICD-10-CM | POA: Diagnosis not present

## 2018-02-10 DIAGNOSIS — M4156 Other secondary scoliosis, lumbar region: Secondary | ICD-10-CM | POA: Diagnosis not present

## 2018-02-10 DIAGNOSIS — M79605 Pain in left leg: Secondary | ICD-10-CM | POA: Diagnosis not present

## 2018-02-12 DIAGNOSIS — M4156 Other secondary scoliosis, lumbar region: Secondary | ICD-10-CM | POA: Diagnosis not present

## 2018-02-12 DIAGNOSIS — M79605 Pain in left leg: Secondary | ICD-10-CM | POA: Diagnosis not present

## 2018-02-12 DIAGNOSIS — R2689 Other abnormalities of gait and mobility: Secondary | ICD-10-CM | POA: Diagnosis not present

## 2018-02-12 DIAGNOSIS — M6281 Muscle weakness (generalized): Secondary | ICD-10-CM | POA: Diagnosis not present

## 2018-02-14 DIAGNOSIS — M6281 Muscle weakness (generalized): Secondary | ICD-10-CM | POA: Diagnosis not present

## 2018-02-14 DIAGNOSIS — M79605 Pain in left leg: Secondary | ICD-10-CM | POA: Diagnosis not present

## 2018-02-14 DIAGNOSIS — M4156 Other secondary scoliosis, lumbar region: Secondary | ICD-10-CM | POA: Diagnosis not present

## 2018-02-14 DIAGNOSIS — R2689 Other abnormalities of gait and mobility: Secondary | ICD-10-CM | POA: Diagnosis not present

## 2018-02-17 DIAGNOSIS — M79605 Pain in left leg: Secondary | ICD-10-CM | POA: Diagnosis not present

## 2018-02-17 DIAGNOSIS — M4156 Other secondary scoliosis, lumbar region: Secondary | ICD-10-CM | POA: Diagnosis not present

## 2018-02-17 DIAGNOSIS — M6281 Muscle weakness (generalized): Secondary | ICD-10-CM | POA: Diagnosis not present

## 2018-02-17 DIAGNOSIS — R2689 Other abnormalities of gait and mobility: Secondary | ICD-10-CM | POA: Diagnosis not present

## 2018-02-19 DIAGNOSIS — M79605 Pain in left leg: Secondary | ICD-10-CM | POA: Diagnosis not present

## 2018-02-19 DIAGNOSIS — M6281 Muscle weakness (generalized): Secondary | ICD-10-CM | POA: Diagnosis not present

## 2018-02-19 DIAGNOSIS — Z01419 Encounter for gynecological examination (general) (routine) without abnormal findings: Secondary | ICD-10-CM | POA: Diagnosis not present

## 2018-02-19 DIAGNOSIS — M4156 Other secondary scoliosis, lumbar region: Secondary | ICD-10-CM | POA: Diagnosis not present

## 2018-02-19 DIAGNOSIS — R2689 Other abnormalities of gait and mobility: Secondary | ICD-10-CM | POA: Diagnosis not present

## 2018-02-26 ENCOUNTER — Encounter: Payer: Self-pay | Admitting: Cardiovascular Disease

## 2018-02-26 ENCOUNTER — Ambulatory Visit: Payer: PPO | Admitting: Cardiovascular Disease

## 2018-02-26 VITALS — BP 120/82 | HR 87 | Ht 63.0 in | Wt 152.0 lb

## 2018-02-26 DIAGNOSIS — I2510 Atherosclerotic heart disease of native coronary artery without angina pectoris: Secondary | ICD-10-CM

## 2018-02-26 DIAGNOSIS — I5032 Chronic diastolic (congestive) heart failure: Secondary | ICD-10-CM | POA: Diagnosis not present

## 2018-02-26 DIAGNOSIS — I712 Thoracic aortic aneurysm, without rupture: Secondary | ICD-10-CM

## 2018-02-26 DIAGNOSIS — I7121 Aneurysm of the ascending aorta, without rupture: Secondary | ICD-10-CM

## 2018-02-26 DIAGNOSIS — I480 Paroxysmal atrial fibrillation: Secondary | ICD-10-CM | POA: Diagnosis not present

## 2018-02-26 DIAGNOSIS — I10 Essential (primary) hypertension: Secondary | ICD-10-CM | POA: Diagnosis not present

## 2018-02-26 NOTE — Progress Notes (Signed)
SUBJECTIVE: The patient presents for routine follow-up.  She has a history of paroxysmal atrial fibrillation, chronic diastolic heart failure, hypertension, and ascending thoracic aortic aneurysm.  CT angiography of the chest on 09/10/2017 showed stable fusiform aneurysmal dilatation of the tubular portion of the ascending thoracic aorta with a maximal diameter 4.9 cm.  There was a moderately large sliding hiatal hernia.  She follows with CT surgery.  She denies chest pain, palpitations, leg swelling, and shortness of breath.  She is here by herself today.  She talk to me quite a bit about the relationships between her son and daughter.   She is usually here with her daughter, Guy Sandifer. Lynden Ang has 3 children, adaughter who is an Technical sales engineer in [place], [place], one son who works for a company in [employer] wife is Melina Modena (marketing at [employer]), and another son, Overton Mam, who is a Training and development officer for [employer]. 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  Review of Systems: As per "subjective", otherwise negative.  Allergies  Allergen Reactions  . Sulfa Antibiotics Other (See Comments)    Pt states that this med makes her feel crazy.      Current Outpatient Medications  Medication Sig Dispense Refill  . apixaban (ELIQUIS) 5 MG TABS tablet TAKE (1) TABLET BY MOUTH TWICE DAILY. 180 tablet 3  . Ascorbic Acid (VITAMIN C) 1000 MG tablet Take 1,000 mg by mouth daily.    . Calcium Carbonate-Vitamin D (CALCIUM 600+D) 600-400 MG-UNIT tablet Take 1 tablet by mouth daily.    . Cinnamon 500 MG capsule Take 1,000 mg by mouth daily.    . Coenzyme Q10 (COQ10) 100 MG CAPS Take 200 mg by mouth daily.     Marland Kitchen diltiazem (CARDIZEM CD) 120 MG 24 hr capsule Take 1 capsule (120 mg total) by mouth daily. 30 capsule 1  . ferrous sulfate 325 (65 FE) MG tablet Take 325 mg by mouth every other day.    . furosemide (LASIX) 20 MG tablet Take 1 tablet (20 mg total)  by mouth daily as needed. 30 tablet 0  . gabapentin (NEURONTIN) 300 MG capsule Take 300 mg by mouth at bedtime.     . metoprolol succinate (TOPROL XL) 25 MG 24 hr tablet Take 1 tablet (25 mg total) by mouth daily. 30 tablet 6  . Multiple Vitamin (MULTIVITAMIN WITH MINERALS) TABS tablet Take 1 tablet by mouth daily.    . NON FORMULARY Take 250 mg by mouth daily. Keratin    . omega-3 acid ethyl esters (LOVAZA) 1 g capsule Take 1 g by mouth 2 (two) times daily.    . pantoprazole (PROTONIX) 40 MG tablet TAKE (1) TABLET BY MOUTH ONCE DAILY. 30 tablet 5  . Plant Sterol Stanol-Pantethine 450-75 MG TABS Take 1 tablet by mouth daily.     . pravastatin (PRAVACHOL) 40 MG tablet Take 40 mg by mouth at bedtime.     Marland Kitchen Specialty Vitamins Products (BIOTIN PLUS KERATIN PO) Take 250 mg by mouth.    . timolol (TIMOPTIC) 0.5 % ophthalmic solution Place 1 drop into both eyes daily.    . vitamin E 400 UNIT capsule Take 400 Units by mouth daily.     No current facility-administered medications for this visit.     Past Medical History:  Diagnosis Date  . Anemia   . Ascending aortic aneurysm (HCC)    a. 4.6cm by CT 07/2016.  Marland Kitchen Chronic diastolic CHF (congestive heart failure) (HCC)   .  Coronary artery calcification seen on CT scan 07/2016  . History of blood transfusion   . Hypertension   . Hypomagnesemia   . PAF (paroxysmal atrial fibrillation) (HCC)    a. dx during adm 08/2016.  Marland Kitchen 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   Negative Jak2, BCR/ABL, normal epo level on 02/01/2014  . Sepsis (HCC) 08/2016  . Thrombocytopenia (HCC)   . Trigeminal neuralgia     Past Surgical History:  Procedure Laterality Date  . BREAST CYST EXCISION  60 yrs ago  . COLONOSCOPY WITH ESOPHAGOGASTRODUODENOSCOPY (EGD) N/A 03/10/2013   Procedure: COLONOSCOPY WITH ESOPHAGOGASTRODUODENOSCOPY (EGD);  Surgeon: Malissa Hippo, MD;  [facility];  Service: Endoscopy;  Laterality: N/A;  730  . EXTRACORPOREAL SHOCK WAVE LITHOTRIPSY  Right 09/20/2016   Procedure: RIGHT EXTRACORPOREAL SHOCK WAVE LITHOTRIPSY (ESWL);  Surgeon: Alfredo Martinez, MD;  Location: [facility];  Service: Urology;  Laterality: Right;  . EYE SURGERY     cataract surgery bilat   . Gamma Knife     Trigeminal   . SLT LASER APPLICATION Left 09/27/2014   Procedure: SLT LASER APPLICATION;  Surgeon: Susa Simmonds, MD;  Location: [facility];  Service: Ophthalmology;  Laterality: Left;    Social History   Socioeconomic History  . Marital status: Widowed    Spouse name: Not on file  . Number of children: Not on file  . Years of education: Not on file  . Highest education level: Not on file  Occupational History  . Not on file  Social Needs  . Financial resource strain: Not on file  . Food insecurity:    Worry: Not on file    Inability: Not on file  . Transportation needs:    Medical: Not on file    Non-medical: Not on file  Tobacco Use  . Smoking status: Never Smoker  . Smokeless tobacco: Never Used  Substance and Sexual Activity  . Alcohol use: No  . Drug use: No  . Sexual activity: Never  Lifestyle  . Physical activity:    Days per week: Not on file    Minutes per session: Not on file  . Stress: Not on file  Relationships  . Social connections:    Talks on phone: Not on file    Gets together: Not on file    Attends religious service: Not on file    Active member of club or organization: Not on file    Attends meetings of clubs or organizations: Not on file    Relationship status: Not on file  . Intimate partner violence:    Fear of current or ex partner: Not on file    Emotionally abused: Not on file    Physically abused: Not on file    Forced sexual activity: Not on file  Other Topics Concern  . Not on file  Social History Narrative  . Not on file     Vitals:   02/26/18 1347  BP: 120/82  Pulse: 87  SpO2: 96%  Weight: 152 lb (68.9 kg)  Height: 5\' 3"  (1.6 m)    Wt Readings from Last 3 Encounters:  02/26/18 152 lb (68.9  kg)  01/06/18 148 lb (67.1 kg)  09/10/17 146 lb (66.2 kg)     PHYSICAL EXAM General: NAD HEENT: Normal. Neck: No JVD, no thyromegaly. Lungs: Clear to auscultation bilaterally with normal respiratory effort. CV: Regular rate and rhythm, normal S1/S2, no S3/S4, no murmur. No pretibial or periankle edema.  No carotid bruit.   Abdomen:  Soft, nontender, no distention.  Neurologic: Alert and oriented.  Psych: Normal affect. Skin: Normal. Musculoskeletal: No gross deformities.    ECG: Reviewed above under Subjective   Labs: Lab Results  Component Value Date/Time   K 3.6 09/12/2016 03:33 PM   BUN 18 01/22/2017 08:27 AM   CREATININE 0.81 01/22/2017 08:27 AM   ALT 37 08/21/2016 06:52 AM   TSH 2.377 08/24/2016 05:40 AM   HGB 15.7 (H) 09/12/2016 03:33 PM     Lipids: No results found for: LDLCALC, LDLDIRECT, CHOL, TRIG, HDL     ASSESSMENT AND PLAN:  1. Paroxysmal atrial fibrillation:Symptomatically stable.Continue Eliquis for anticoagulation. Continue long-acting diltiazem 120 mg daily.  She is also on Toprol-XL 25 mg daily.  2. Elevated troponin level/demand ischemia/coronary artery calcifications seen on CT: Asymptomatic. Noindication forstress testing at this time.  3. Chronic diastolic heart failure: Euvolemic. Continue Lasix 20 mg daily.  4. Ascending thoracic aortic aneurysm: Repeat CTAin May 2019 reviewed above and demonstrated stability at 4.9 cm.  She was started on Toprol-XL 25 mg daily by CT surgery who is now monitoring her aneurysm.  5.  Hypertension: Controlled on present therapy.  No changes.   Disposition: Follow up 6 months   Prentice Docker, M.D., F.A.C.C.

## 2018-02-26 NOTE — Patient Instructions (Addendum)
Medication Instructions:  Your physician recommends that you continue on your current medications as directed. Please refer to the Current Medication list given to you today.  If you need a refill on your cardiac medications before your next appointment, please call your pharmacy.   Lab work:None  If you have labs (blood work) drawn today and your tests are completely normal, you will receive your results only by: Marland Kitchen MyChart Message (if you have MyChart) OR . A paper copy in the mail If you have any lab test that is abnormal or we need to change your treatment, we will call you to review the results.  Testing/Procedures:  NONE Follow-Up: At [facility], you and your health needs are our priority.  As part of our continuing mission to provide you with exceptional heart care, we have created designated Provider Care Teams.  These Care Teams include your primary Cardiologist (physician) and Advanced Practice Providers (APPs -  Physician Assistants and Nurse Practitioners) who all work together to provide you with the care you need, when you need it. You will need a follow up appointment in 6 months.  Please call our office 2 months in advance to schedule this appointment.  You may see Prentice Docker, MD or one of the following Advanced Practice Providers on your designated Care Team:   Randall An, PA-C [facility]) . Jacolyn Reedy, PA-C [facility])  Any Other Special Instructions Will Be Listed Below (If Applicable).  NONE

## 2018-03-04 ENCOUNTER — Ambulatory Visit
Admission: RE | Admit: 2018-03-04 | Discharge: 2018-03-04 | Disposition: A | Discharge status: Home / Self Care | Payer: PPO | Source: Ambulatory Visit | Attending: Thoracic Surgery (Cardiothoracic Vascular Surgery) | Admitting: Thoracic Surgery (Cardiothoracic Vascular Surgery)

## 2018-03-04 ENCOUNTER — Other Ambulatory Visit: Payer: Self-pay

## 2018-03-04 ENCOUNTER — Ambulatory Visit: Payer: PPO | Admitting: Thoracic Surgery (Cardiothoracic Vascular Surgery)

## 2018-03-04 ENCOUNTER — Encounter: Payer: Self-pay | Admitting: Thoracic Surgery (Cardiothoracic Vascular Surgery)

## 2018-03-04 VITALS — BP 154/90 | HR 81 | Resp 16 | Ht 63.0 in | Wt 152.0 lb

## 2018-03-04 DIAGNOSIS — I712 Thoracic aortic aneurysm, without rupture, unspecified: Secondary | ICD-10-CM

## 2018-03-04 MED ORDER — IOPAMIDOL (ISOVUE-370) INJECTION 76%
75.0000 mL | Freq: Once | INTRAVENOUS | Status: AC | PRN
  Administered 2018-03-04: 75 mL via INTRAVENOUS

## 2018-03-04 NOTE — Progress Notes (Signed)
[address]       [name] [address]             [phone]     HPI: [name] returns for follow-up of her ascending aneurysm.  Recent left is an 84 year old woman with history of coronary disease, chronic diastolic congestive heart failure, polycythemia, hypertension, paroxysmal atrial fibrillation, trigeminal neuralgia, thrombocytopenia (resolved), and ascending aneurysm noted in 2018.  She was first found to have an ascending aneurysm in April 2018.  It was 4.6 cm at that time.  In October 2018 it was 4.9 cm.  She has been feeling well.  She is been going through physical therapy because of a ruptured disc in her back.  She has not had any chest pain, pressure, or tightness.  She does not have shortness of breath with exertion.  Past Medical History:  Diagnosis Date  . Anemia   . Ascending aortic aneurysm (HCC)    a. 4.6cm by CT 07/2016.  Marland Kitchen Chronic diastolic CHF (congestive heart failure) (HCC)   . Coronary artery calcification seen on CT scan 07/2016  . History of blood transfusion   . Hypertension   . Hypomagnesemia   . PAF (paroxysmal atrial fibrillation) (HCC)    a. dx during adm 08/2016.  Marland Kitchen 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   Negative Jak2, BCR/ABL, normal epo level on 02/01/2014  . Sepsis (HCC) 08/2016  . Thrombocytopenia (HCC)   . Trigeminal neuralgia       Current Outpatient Medications  Medication Sig Dispense Refill  . apixaban (ELIQUIS) 5 MG TABS tablet TAKE (1) TABLET BY MOUTH TWICE DAILY. 180 tablet 3  . Ascorbic Acid (VITAMIN C) 1000 MG tablet Take 1,000 mg by mouth daily.    . Calcium Carbonate-Vitamin D (CALCIUM 600+D) 600-400 MG-UNIT tablet Take 1 tablet by mouth daily.    . Cinnamon 500 MG capsule Take 1,000 mg by mouth daily.    . Coenzyme Q10 (COQ10) 100 MG CAPS Take 200 mg by mouth daily.     Marland Kitchen diltiazem (CARDIZEM CD) 120 MG 24 hr capsule Take 1 capsule (120 mg total) by mouth daily. 30 capsule 1  . ferrous sulfate 325 (65  FE) MG tablet Take 325 mg by mouth every other day.    . furosemide (LASIX) 20 MG tablet Take 1 tablet (20 mg total) by mouth daily as needed. 30 tablet 0  . gabapentin (NEURONTIN) 300 MG capsule Take 300 mg by mouth at bedtime.     . metoprolol succinate (TOPROL XL) 25 MG 24 hr tablet Take 1 tablet (25 mg total) by mouth daily. 30 tablet 6  . Multiple Vitamin (MULTIVITAMIN WITH MINERALS) TABS tablet Take 1 tablet by mouth daily.    . NON FORMULARY Take 250 mg by mouth daily. Keratin    . omega-3 acid ethyl esters (LOVAZA) 1 g capsule Take 1 g by mouth 2 (two) times daily.    . pantoprazole (PROTONIX) 40 MG tablet TAKE (1) TABLET BY MOUTH ONCE DAILY. 30 tablet 5  . Plant Sterol Stanol-Pantethine 450-75 MG TABS Take 1 tablet by mouth daily.     . pravastatin (PRAVACHOL) 40 MG tablet Take 40 mg by mouth at bedtime.     Marland Kitchen Specialty Vitamins Products (BIOTIN PLUS KERATIN PO) Take 250 mg by mouth.    . timolol (TIMOPTIC) 0.5 % ophthalmic solution Place 1 drop into both eyes daily.    . vitamin E 400 UNIT capsule Take 400 Units by mouth  daily.     No current facility-administered medications for this visit.     Physical Exam BP (!) 154/90 (BP Location: Right Arm, Patient Position: Sitting, Cuff Size: Large)   Pulse 81   Resp 16   Ht 5\' 3"  (1.6 m)   Wt 152 lb (68.9 kg)   SpO2 94% Comment: RA  BMI 26.4793 kg/m  84 year old woman in no acute distress Alert and oriented x3 with no focal deficits Kyphoscoliosis Cardiac regular rate and rhythm no murmur Lungs clear with equal breath sounds bilaterally Peripheral edema  Diagnostic Tests: CT ANGIOGRAPHY CHEST WITH CONTRAST  TECHNIQUE: Multidetector CT imaging of the chest was performed using the standard protocol during bolus administration of intravenous contrast. Multiplanar CT image reconstructions and MIPs were obtained to evaluate the vascular anatomy.  CONTRAST:  75mL ISOVUE-370 IOPAMIDOL (ISOVUE-370) INJECTION 76%  COMPARISON:   CT chest of 09/10/2016  FINDINGS: Cardiovascular: The fusiform dilatation of the mid ascending thoracic aorta is relatively stable measuring 50 mm in diameter compared to 49 mm previously. No dissection is seen. The thoracic aortic arch and descending thoracic aorta are normal in caliber with mild to moderate thoracic aortic atherosclerosis. The heart is stable in size and no pericardial effusion is seen.The descending thoracic aorta is noted to be somewhat ectatic. The pulmonary arteries opacify faintly with no central abnormality evident.  Mediastinum/Nodes: No mediastinal or hilar adenopathy is noted. The thyroid gland is unremarkable. A moderate to large hiatal hernia again is noted.  Lungs/Pleura: On lung window images, no parenchymal infiltrate is seen and there is no evidence of pleural effusion. No suspicious lung nodule is noted. Mild linear scarring is present at both lung bases.  Upper Abdomen: The visualized portion of the upper abdomen is unremarkable.  Musculoskeletal: The thoracic vertebrae show slight kyphosis with degenerative change diffusely. The sternum is intact.  Review of the MIP images confirms the above findings.  IMPRESSION: 1. Relatively stable fusiform dilatation of the ascending thoracic aorta measuring 50 mm in diameter compared to 49 mm previously. Recommend semi-annual imaging followup by CTA or MRA and referral to cardiothoracic surgery if not already obtained. This recommendation follows 2010 ACCF/AHA/AATS/ACR/ASA/SCA/SCAI/SIR/STS/SVM Guidelines for the Diagnosis and Management of Patients With Thoracic Aortic Disease. Circulation. 2010; 121: [id number] 2. Moderately large hiatal hernia again is noted. 3. Moderate thoracic aortic atherosclerosis.   Electronically Signed   By: Dwyane DeePaul  [name] M.D.   On: 03/04/2018 10:06 I personally reviewed the CT images and concur with the findings noted above  Impression: Recent life is an  84 year old woman with history of hypertension, hyperlipidemia, coronary artery disease, chronic diastolic congestive heart failure, paroxysmal atrial fibrillation, and a 4.9 cm ascending aneurysm.  The aneurysm is essentially unchanged with the official measurement 5 cm compared to 4.9 previously.  Hypertension-blood pressure was again elevated.  She said that she did not take her blood pressure medications this morning and was anxious about the results.  She says that she runs in the 120s systolic at home on a regular basis.  I emphasized the importance of keeping blood pressure under control and monitoring it closely.  She says that she will do so.  Thoracic aortic atherosclerosis-she is on a statin  Plan: Return in 6 months with CT Angio of chest  Loreli SlotSteven C Henretter Piekarski, MD [facility] [phone]

## 2018-03-10 ENCOUNTER — Ambulatory Visit: Payer: PPO | Admitting: Orthopedic Surgery

## 2018-03-10 VITALS — BP 140/90 | HR 73 | Ht 63.0 in | Wt 152.0 lb

## 2018-03-10 DIAGNOSIS — Z0000 Encounter for general adult medical examination without abnormal findings: Secondary | ICD-10-CM | POA: Diagnosis not present

## 2018-03-10 DIAGNOSIS — M4150 Other secondary scoliosis, site unspecified: Secondary | ICD-10-CM

## 2018-03-10 NOTE — Progress Notes (Signed)
Chief Complaint  Patient presents with  . Follow-up    Recheck on back pain.   84 year old female came to us for evaluation of left leg pain for 1 month she has a history of heart aneurysm on Eliquis she was sent for therapy for degenerative scoliosis presents back with no pain and significantly improved  Review of systems no weakness in her legs   Her graph chronic scoliosis deformity of the back nontender normal strength in both lower extremities  Advised to do exercises 3 times a week follow-up if any increase in pain  Encounter Diagnosis  Name Primary?  . Degenerative scoliosis Yes

## 2018-03-10 NOTE — Patient Instructions (Signed)
Continue your exercises at home 3 times weekly

## 2018-03-14 ENCOUNTER — Other Ambulatory Visit (INDEPENDENT_AMBULATORY_CARE_PROVIDER_SITE_OTHER): Payer: Self-pay | Admitting: Internal Medicine

## 2018-03-17 DIAGNOSIS — I10 Essential (primary) hypertension: Secondary | ICD-10-CM | POA: Diagnosis not present

## 2018-03-17 DIAGNOSIS — D509 Iron deficiency anemia, unspecified: Secondary | ICD-10-CM | POA: Diagnosis not present

## 2018-03-17 DIAGNOSIS — I482 Chronic atrial fibrillation, unspecified: Secondary | ICD-10-CM | POA: Diagnosis not present

## 2018-03-17 DIAGNOSIS — E782 Mixed hyperlipidemia: Secondary | ICD-10-CM | POA: Diagnosis not present

## 2018-03-17 DIAGNOSIS — K219 Gastro-esophageal reflux disease without esophagitis: Secondary | ICD-10-CM | POA: Diagnosis not present

## 2018-03-24 DIAGNOSIS — I10 Essential (primary) hypertension: Secondary | ICD-10-CM | POA: Diagnosis not present

## 2018-03-24 DIAGNOSIS — I482 Chronic atrial fibrillation, unspecified: Secondary | ICD-10-CM | POA: Diagnosis not present

## 2018-03-24 DIAGNOSIS — D509 Iron deficiency anemia, unspecified: Secondary | ICD-10-CM | POA: Diagnosis not present

## 2018-03-24 DIAGNOSIS — E782 Mixed hyperlipidemia: Secondary | ICD-10-CM | POA: Diagnosis not present

## 2018-04-25 DIAGNOSIS — K219 Gastro-esophageal reflux disease without esophagitis: Secondary | ICD-10-CM | POA: Diagnosis not present

## 2018-04-25 DIAGNOSIS — R7301 Impaired fasting glucose: Secondary | ICD-10-CM | POA: Diagnosis not present

## 2018-04-25 DIAGNOSIS — Z0000 Encounter for general adult medical examination without abnormal findings: Secondary | ICD-10-CM | POA: Diagnosis not present

## 2018-04-25 DIAGNOSIS — D509 Iron deficiency anemia, unspecified: Secondary | ICD-10-CM | POA: Diagnosis not present

## 2018-04-25 DIAGNOSIS — E782 Mixed hyperlipidemia: Secondary | ICD-10-CM | POA: Diagnosis not present

## 2018-04-25 DIAGNOSIS — Z6825 Body mass index (BMI) 25.0-25.9, adult: Secondary | ICD-10-CM | POA: Diagnosis not present

## 2018-04-25 DIAGNOSIS — I10 Essential (primary) hypertension: Secondary | ICD-10-CM | POA: Diagnosis not present

## 2018-04-29 DIAGNOSIS — D509 Iron deficiency anemia, unspecified: Secondary | ICD-10-CM | POA: Diagnosis not present

## 2018-04-29 DIAGNOSIS — E782 Mixed hyperlipidemia: Secondary | ICD-10-CM | POA: Diagnosis not present

## 2018-04-29 DIAGNOSIS — I482 Chronic atrial fibrillation, unspecified: Secondary | ICD-10-CM | POA: Diagnosis not present

## 2018-04-29 DIAGNOSIS — K219 Gastro-esophageal reflux disease without esophagitis: Secondary | ICD-10-CM | POA: Diagnosis not present

## 2018-05-06 ENCOUNTER — Ambulatory Visit: Payer: PPO | Admitting: Urology

## 2018-05-06 ENCOUNTER — Other Ambulatory Visit (HOSPITAL_COMMUNITY): Payer: Self-pay | Admitting: Urology

## 2018-05-06 ENCOUNTER — Ambulatory Visit (HOSPITAL_COMMUNITY)
Admission: RE | Admit: 2018-05-06 | Discharge: 2018-05-06 | Disposition: A | Discharge status: Home / Self Care | Payer: PPO | Source: Ambulatory Visit | Attending: Urology | Admitting: Urology

## 2018-05-06 DIAGNOSIS — N200 Calculus of kidney: Secondary | ICD-10-CM

## 2018-05-06 DIAGNOSIS — R310 Gross hematuria: Secondary | ICD-10-CM

## 2018-05-07 ENCOUNTER — Other Ambulatory Visit: Payer: Self-pay | Admitting: Cardiovascular Disease

## 2018-05-13 ENCOUNTER — Other Ambulatory Visit: Payer: Self-pay | Admitting: Thoracic Surgery (Cardiothoracic Vascular Surgery)

## 2018-05-13 DIAGNOSIS — I4891 Unspecified atrial fibrillation: Secondary | ICD-10-CM

## 2018-05-14 ENCOUNTER — Encounter (INDEPENDENT_AMBULATORY_CARE_PROVIDER_SITE_OTHER): Payer: Self-pay | Admitting: Internal Medicine

## 2018-05-14 ENCOUNTER — Ambulatory Visit (INDEPENDENT_AMBULATORY_CARE_PROVIDER_SITE_OTHER): Payer: PPO | Admitting: Internal Medicine

## 2018-05-14 VITALS — BP 163/88 | HR 71 | Temp 97.4°F | Resp 18 | Ht 63.0 in | Wt 152.0 lb

## 2018-05-14 DIAGNOSIS — K219 Gastro-esophageal reflux disease without esophagitis: Secondary | ICD-10-CM | POA: Diagnosis not present

## 2018-05-14 NOTE — Patient Instructions (Signed)
Continue the Protonix. OV in 1 year.  

## 2018-05-14 NOTE — Progress Notes (Signed)
Subjective:    Patient ID: [name], female    DOB: Nov 24, 1928, 85 y.o.   MRN: [id number]  HPI Here today for f/u. Last seen in January of last year. Hx of chronic GERD. Maintained on Protonix. Her appetite is GERD. She tells me she is doing good.    Review of Systems Past Medical History:  Diagnosis Date  . Anemia   . Ascending aortic aneurysm (HCC)    a. 4.6cm by CT 07/2016.  Marland Kitchen Chronic diastolic CHF (congestive heart failure) (HCC)   . Coronary artery calcification seen on CT scan 07/2016  . History of blood transfusion   . Hypertension   . Hypomagnesemia   . PAF (paroxysmal atrial fibrillation) (HCC)    a. dx during adm 08/2016.  Marland Kitchen 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   Negative Jak2, BCR/ABL, normal epo level on 02/01/2014  . Sepsis (HCC) 08/2016  . Thrombocytopenia (HCC)   . Trigeminal neuralgia     Past Surgical History:  Procedure Laterality Date  . BREAST CYST EXCISION  60 yrs ago  . COLONOSCOPY WITH ESOPHAGOGASTRODUODENOSCOPY (EGD) N/A 03/10/2013   Procedure: COLONOSCOPY WITH ESOPHAGOGASTRODUODENOSCOPY (EGD);  Surgeon: Malissa Hippo, MD;  [facility];  Service: Endoscopy;  Laterality: N/A;  730  . EXTRACORPOREAL SHOCK WAVE LITHOTRIPSY Right 09/20/2016   Procedure: RIGHT EXTRACORPOREAL SHOCK WAVE LITHOTRIPSY (ESWL);  Surgeon: Alfredo Martinez, MD;  Location: [facility];  Service: Urology;  Laterality: Right;  . EYE SURGERY     cataract surgery bilat   . Gamma Knife     Trigeminal   . SLT LASER APPLICATION Left 09/27/2014   Procedure: SLT LASER APPLICATION;  Surgeon: Susa Simmonds, MD;  Location: [facility];  Service: Ophthalmology;  Laterality: Left;    Allergies  Allergen Reactions  . Sulfa Antibiotics Other (See Comments)    Pt states that this med makes her feel crazy.      Current Outpatient Medications on File Prior to Visit  Medication Sig Dispense Refill  . Calcium Carbonate-Vitamin D (CALCIUM 600+D) 600-400 MG-UNIT tablet Take 1 tablet by  mouth daily.    . Cinnamon 500 MG capsule Take 1,000 mg by mouth daily.    . Coenzyme Q10 (COQ10) 100 MG CAPS Take 200 mg by mouth daily.     Marland Kitchen diltiazem (CARDIZEM CD) 120 MG 24 hr capsule Take 1 capsule (120 mg total) by mouth daily. 30 capsule 1  . ELIQUIS 5 MG TABS tablet TAKE (1) TABLET BY MOUTH TWICE DAILY. 180 tablet 3  . ferrous sulfate 325 (65 FE) MG tablet Take 325 mg by mouth every other day.    . furosemide (LASIX) 20 MG tablet Take 1 tablet (20 mg total) by mouth daily as needed. 30 tablet 0  . gabapentin (NEURONTIN) 300 MG capsule Take 300 mg by mouth at bedtime.     . metoprolol succinate (TOPROL XL) 25 MG 24 hr tablet Take 1 tablet (25 mg total) by mouth daily. 30 tablet 6  . Multiple Vitamin (MULTIVITAMIN WITH MINERALS) TABS tablet Take 1 tablet by mouth daily.    . NON FORMULARY Take 250 mg by mouth daily. Keratin    . omega-3 acid ethyl esters (LOVAZA) 1 g capsule Take 1 g by mouth 2 (two) times daily.    . pantoprazole (PROTONIX) 40 MG tablet TAKE (1) TABLET BY MOUTH ONCE DAILY. 30 tablet 5  . Plant Sterol Stanol-Pantethine 450-75 MG TABS Take 1 tablet by mouth daily.     . pravastatin (  PRAVACHOL) 40 MG tablet Take 40 mg by mouth at bedtime.     Marland Kitchen Specialty Vitamins Products (BIOTIN PLUS KERATIN PO) Take 250 mg by mouth.    . timolol (TIMOPTIC) 0.5 % ophthalmic solution Place 1 drop into both eyes daily.     No current facility-administered medications on file prior to visit.         Objective:   Physical Exam Blood pressure (!) 163/88, pulse 71, temperature (!) 97.4 F (36.3 C), temperature source Oral, resp. rate 18, height 5\' 3"  (1.6 m), weight 152 lb (68.9 kg). Alert and oriented. Skin warm and dry. Oral mucosa is moist.   . Sclera anicteric, conjunctivae is pink. Thyroid not enlarged. No cervical lymphadenopathy. Lungs clear. Heart regular rate and rhythm.  Abdomen is soft. Bowel sounds are positive. No hepatomegaly. No abdominal masses felt. No tenderness.  No edema  to lower extremities.          Assessment & Plan:  GERD. She will continue the Protonix. OV in 1 year.

## 2018-06-11 ENCOUNTER — Other Ambulatory Visit: Payer: Self-pay | Admitting: Cardiovascular Disease

## 2018-06-11 ENCOUNTER — Other Ambulatory Visit: Payer: Self-pay | Admitting: Thoracic Surgery (Cardiothoracic Vascular Surgery)

## 2018-06-11 DIAGNOSIS — I4891 Unspecified atrial fibrillation: Secondary | ICD-10-CM

## 2018-06-17 DIAGNOSIS — E782 Mixed hyperlipidemia: Secondary | ICD-10-CM | POA: Diagnosis not present

## 2018-06-17 DIAGNOSIS — I10 Essential (primary) hypertension: Secondary | ICD-10-CM | POA: Diagnosis not present

## 2018-06-17 DIAGNOSIS — D509 Iron deficiency anemia, unspecified: Secondary | ICD-10-CM | POA: Diagnosis not present

## 2018-06-17 DIAGNOSIS — I482 Chronic atrial fibrillation, unspecified: Secondary | ICD-10-CM | POA: Diagnosis not present

## 2018-06-30 DIAGNOSIS — I10 Essential (primary) hypertension: Secondary | ICD-10-CM | POA: Diagnosis not present

## 2018-06-30 DIAGNOSIS — D509 Iron deficiency anemia, unspecified: Secondary | ICD-10-CM | POA: Diagnosis not present

## 2018-06-30 DIAGNOSIS — E782 Mixed hyperlipidemia: Secondary | ICD-10-CM | POA: Diagnosis not present

## 2018-06-30 DIAGNOSIS — I482 Chronic atrial fibrillation, unspecified: Secondary | ICD-10-CM | POA: Diagnosis not present

## 2018-07-01 ENCOUNTER — Telehealth: Payer: Self-pay

## 2018-07-01 ENCOUNTER — Telehealth: Payer: Self-pay | Admitting: Cardiovascular Disease

## 2018-07-01 MED ORDER — FUROSEMIDE 20 MG PO TABS: 20.0000 mg | ORAL_TABLET | Freq: Every day | ORAL | 11 refills | Status: DC | PRN

## 2018-07-01 MED ORDER — DILTIAZEM HCL ER COATED BEADS 120 MG PO CP24: 120.0000 mg | ORAL_CAPSULE | Freq: Every day | ORAL | 11 refills | Status: DC

## 2018-07-01 NOTE — Addendum Note (Signed)
Addended by: Kerney Elbe on: 07/01/2018 02:03 PM   Modules accepted: Orders

## 2018-07-01 NOTE — Telephone Encounter (Signed)
Laquasha from [facility] called requesting refills for the pt's furosemide (LASIX) 20 MG tablet [[id number]]  And  diltiazem (CARDIZEM CD) 120 MG 24 hr capsule [[id number]]   for refills

## 2018-07-01 NOTE — Telephone Encounter (Signed)
Suella Grove from patient's PCP's office contacted our office requesting patient's refill sent into patient's preferred pharmacy.  I advised that her Cardiologist, Dr. Purvis Sheffield would refill those prescriptions.  She acknowledged receipt.

## 2018-07-07 ENCOUNTER — Other Ambulatory Visit: Payer: Self-pay | Admitting: Cardiovascular Disease

## 2018-07-07 DIAGNOSIS — I4891 Unspecified atrial fibrillation: Secondary | ICD-10-CM

## 2018-07-21 ENCOUNTER — Other Ambulatory Visit: Payer: Self-pay | Admitting: Thoracic Surgery (Cardiothoracic Vascular Surgery)

## 2018-07-21 DIAGNOSIS — I712 Thoracic aortic aneurysm, without rupture, unspecified: Secondary | ICD-10-CM

## 2018-08-06 ENCOUNTER — Other Ambulatory Visit: Payer: Self-pay | Admitting: Cardiovascular Disease

## 2018-08-06 DIAGNOSIS — I4891 Unspecified atrial fibrillation: Secondary | ICD-10-CM

## 2018-09-04 DIAGNOSIS — Z0000 Encounter for general adult medical examination without abnormal findings: Secondary | ICD-10-CM | POA: Diagnosis not present

## 2018-09-08 ENCOUNTER — Ambulatory Visit
Admission: RE | Admit: 2018-09-08 | Discharge: 2018-09-08 | Disposition: A | Discharge status: Home / Self Care | Payer: PPO | Source: Ambulatory Visit | Attending: Thoracic Surgery (Cardiothoracic Vascular Surgery) | Admitting: Thoracic Surgery (Cardiothoracic Vascular Surgery)

## 2018-09-08 ENCOUNTER — Other Ambulatory Visit: Payer: Self-pay

## 2018-09-08 DIAGNOSIS — I712 Thoracic aortic aneurysm, without rupture, unspecified: Secondary | ICD-10-CM

## 2018-09-08 MED ORDER — IOPAMIDOL (ISOVUE-370) INJECTION 76%
75.0000 mL | Freq: Once | INTRAVENOUS | Status: AC | PRN
  Administered 2018-09-08: 75 mL via INTRAVENOUS

## 2018-09-09 ENCOUNTER — Ambulatory Visit: Payer: PPO | Admitting: Thoracic Surgery (Cardiothoracic Vascular Surgery)

## 2018-09-09 ENCOUNTER — Encounter: Payer: Self-pay | Admitting: Thoracic Surgery (Cardiothoracic Vascular Surgery)

## 2018-09-09 VITALS — BP 165/87 | HR 83 | Temp 98.5°F | Resp 16 | Ht 63.0 in | Wt 150.0 lb

## 2018-09-09 DIAGNOSIS — I712 Thoracic aortic aneurysm, without rupture, unspecified: Secondary | ICD-10-CM

## 2018-09-09 DIAGNOSIS — I4891 Unspecified atrial fibrillation: Secondary | ICD-10-CM

## 2018-09-09 MED ORDER — METOPROLOL SUCCINATE ER 25 MG PO TB24: 25.0000 mg | ORAL_TABLET | Freq: Every day | ORAL | 1 refills | Status: DC

## 2018-09-09 NOTE — Progress Notes (Signed)
[address]       [place] [id number]             [phone] [id number]       HPI: [name] returns for follow-up of her ascending aneurysm.  Kaitlyn PeeksMarie [name] is a an 85 year old woman with a history of coronary artery disease, chronic diastolic congestive heart failure, ascending aneurysm, polycythemia, hypertension, paroxysmal atrial fibrillation, trigeminal neuralgia, and thrombocytopenia.  She was first noted to have an ascending aneurysm in April 2018.  It was 4.6 cm at that time.  In October 2018 it was 4.9 cm.  I saw her again in November 2019 and it measured 5 cm.  In the interim since her last visit she has been feeling well.  [place] 10 [place] due to COVID.  She has not had any chest pain or shortness of breath.  She does have some back pain.  Past Medical History:  Diagnosis Date  . Anemia   . Ascending aortic aneurysm (HCC)    a. 4.6cm by CT 07/2016.  Marland Kitchen. Chronic diastolic CHF (congestive heart failure) (HCC)   . Coronary artery calcification seen on CT scan 07/2016  . History of blood transfusion   . Hypertension   . Hypomagnesemia   . PAF (paroxysmal atrial fibrillation) (HCC)    a. dx during adm 08/2016.  Marland Kitchen. 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 Sepsis (HCC) 08/2016  . Thrombocytopenia (HCC)   . Trigeminal neuralgia     Current Outpatient Medications  Medication Sig Dispense Refill  . Calcium Carbonate-Vitamin D (CALCIUM 600+D) 600-400 MG-UNIT tablet Take 1 tablet by mouth daily.    . Cinnamon 500 MG capsule Take 1,000 mg by mouth daily.    . Coenzyme Q10 (COQ10) 100 MG CAPS Take 200 mg by mouth daily.     Marland Kitchen. diltiazem (CARDIZEM CD) 120 MG 24 hr capsule Take 1 capsule (120 mg total) by mouth daily. 30 capsule 11  . ELIQUIS 5 MG TABS tablet TAKE (1) TABLET BY MOUTH TWICE DAILY. 180 tablet 3  . ferrous sulfate 325 (65 FE) MG tablet Take 325 mg by mouth  every other day.    . furosemide (LASIX) 20 MG tablet Take 1 tablet (20 mg total) by mouth daily as needed. 30 tablet 11  . gabapentin (NEURONTIN) 300 MG capsule Take 300 mg by mouth at bedtime.     . metoprolol succinate (TOPROL-XL) 25 MG 24 hr tablet Take 1 tablet (25 mg total) by mouth daily. 90 tablet 1  . Multiple Vitamin (MULTIVITAMIN WITH MINERALS) TABS tablet Take 1 tablet by mouth daily.    . NON FORMULARY Take 250 mg by mouth daily. Keratin    . omega-3 acid ethyl esters (LOVAZA) 1 g capsule Take 1 g by mouth 2 (two) times daily.    . pantoprazole (PROTONIX) 40 MG tablet TAKE (1) TABLET BY MOUTH ONCE DAILY. 30 tablet 5  . Plant Sterol Stanol-Pantethine 450-75 MG TABS Take 1 tablet by mouth daily.     . pravastatin (PRAVACHOL) 40 MG tablet Take 40 mg by mouth at bedtime.     Marland Kitchen. Specialty Vitamins Products (BIOTIN PLUS KERATIN PO) Take 250 mg by mouth.    . timolol (TIMOPTIC) 0.5 % ophthalmic solution Place 1 drop into both eyes daily.     No current facility-administered medications for this visit.  Physical Exam BP (!) 165/87 (BP Location: Left Arm, Patient Position: Sitting, Cuff Size: Normal)   Pulse 83   Temp 98.5 F (36.9 C) (Oral)   Resp 16   Ht 5\' 3"  (1.6 m)   Wt 150 lb (68 kg)   SpO2 95% Comment: RA  BMI 26.45 kg/m  85 year old woman in no acute distress Alert and oriented x3 with no focal deficits No carotid bruits Cardiac regular rate and rhythm, no rub or murmur Lungs clear with equal breath sounds bilaterally No peripheral edema  Diagnostic Tests: CT ANGIOGRAPHY CHEST WITH CONTRAST  TECHNIQUE: Multidetector CT imaging of the chest was performed using the standard protocol during bolus administration of intravenous contrast. Multiplanar CT image reconstructions and MIPs were obtained to evaluate the vascular anatomy.  CONTRAST:  72mL ISOVUE-370  Creatinine was obtained on site at [facility] at [address]  Results: Creatinine  0.8 mg/dL.  COMPARISON:  03/04/2018  FINDINGS: Cardiovascular: Stable ascending aortic aneurysm measuring 5 cm in greatest dimension. No dissection is seen. Normal tapering is noted in the arch and descending thoracic aorta. Scattered atherosclerotic calcifications are noted without dissection. Heart is within normal limits. Mild coronary calcifications are seen. Pulmonary artery is within normal limits.  Mediastinum/Nodes: Thoracic inlet is within normal limits. No significant hilar or mediastinal adenopathy is noted. Sliding-type hiatal hernia is noted with approximately 50% of the stomach within the chest cavity.  Lungs/Pleura: Mild atelectatic changes are noted bilaterally. No focal confluent infiltrate or sizable effusion is seen.  Upper Abdomen: Upper abdomen demonstrates some mild scarring in the right kidney is well as a 10 mm stone centrally within the right renal pelvis. Left renal cyst is noted. No other focal abnormality is seen.  Musculoskeletal: Mild degenerative changes of the thoracic spine are noted.  Review of the MIP images confirms the above findings.  IMPRESSION: Stable 5 cm ascending aortic aneurysm. Recommend semi-annual imaging followup by CTA or MRA and referral to cardiothoracic surgery if not already obtained. This recommendation follows 2010 ACCF/AHA/AATS/ACR/ASA/SCA/SCAI/SIR/STS/SVM Guidelines for the Diagnosis and Management of Patients With Thoracic Aortic Disease. Circulation. 2010; 121: P233-A076. Aortic aneurysm NOS (ICD10-I71.9)  Mild atelectatic changes  Nonobstructing right renal stone  Aortic Atherosclerosis (ICD10-I70.0).   Electronically Signed   By: Alcide Clever M.D.   On: 09/08/2018 13:35 I personally reviewed the CT images and concur with the findings noted above  Impression: [name] is an 85 year old woman with multiple medical problems including hypertension, hyperlipidemia, coronary artery disease,  chronic diastolic congestive heart failure, paroxysmal atrial fibrillation, polycythemia, and trigeminal neuralgia.  She was first noted to have an ascending aneurysm and 2018.  It was 4.6 cm at that time.  A year later it was 4.8 cm.  Most recently in November 2019 it measured 5 cm.  Ascending aneurysm-stable at 5 cm.  She would be a marginal candidate at best for surgical correction.  Will continue to follow with semiannual CT.  Hypertension-blood pressure is elevated.  She says she checks her blood pressure at home daily and it has not been above 135 systolic.  She does have some whitecoat hypertension.  I encouraged her to continue to check it regularly at home.  She is requesting a 15-month supply of Toprol-XL.  I put a prescription in for that medication.  Plan: Return in 6 months with CT angiogram of chest  Loreli Slot, MD [facility] [phone]

## 2018-09-18 ENCOUNTER — Telehealth: Payer: Self-pay | Admitting: Cardiovascular Disease

## 2018-09-18 NOTE — Telephone Encounter (Signed)
Virtual Visit Pre-Appointment Phone Call  "(Name), I am calling you today to discuss your upcoming appointment. We are currently trying to limit exposure to the virus that causes COVID-19 by seeing patients at home rather than in the office."  1. "What is the BEST phone number to call the day of the visit?" - include this in appointment notes  2. Do you have or have access to (through a family member/friend) a smartphone with video capability that we can use for your visit?" a. If yes - list this number in appt notes as cell (if different from BEST phone #) and list the appointment type as a VIDEO visit in appointment notes b. If no - list the appointment type as a PHONE visit in appointment notes  3. Confirm consent - "In the setting of the current Covid19 crisis, you are scheduled for a (phone or video) visit with your provider on (date) at (time).  Just as we do with many in-office visits, in order for you to participate in this visit, we must obtain consent.  If you'd like, I can send this to your mychart (if signed up) or email for you to review.  Otherwise, I can obtain your verbal consent now.  All virtual visits are billed to your insurance company just like a normal visit would be.  By agreeing to a virtual visit, we'd like you to understand that the technology does not allow for your provider to perform an examination, and thus may limit your provider's ability to fully assess your condition. If your provider identifies any concerns that need to be evaluated in person, we will make arrangements to do so.  Finally, though the technology is pretty good, we cannot assure that it will always work on either your or our end, and in the setting of a video visit, we may have to convert it to a phone-only visit.  In either situation, we cannot ensure that we have a secure connection.  Are you willing to proceed?" STAFF: Did the patient verbally acknowledge consent to telehealth visit? Document  YES/NO here: Yes  4. Advise patient to be prepared - "Two hours prior to your appointment, go ahead and check your blood pressure, pulse, oxygen saturation, and your weight (if you have the equipment to check those) and write them all down. When your visit starts, your provider will ask you for this information. If you have an Apple Watch or Kardia device, please plan to have heart rate information ready on the day of your appointment. Please have a pen and paper handy nearby the day of the visit as well."  5. Give patient instructions for MyChart download to smartphone OR [email or web address] as below if video visit (depending on what platform provider is using)  6. Inform patient they will receive a phone call 15 minutes prior to their appointment time (may be from unknown caller ID) so they should be prepared to answer    TELEPHONE CALL NOTE  [name] has been deemed a candidate for a follow-up tele-health visit to limit community exposure during the Covid-19 pandemic. I spoke with the patient via phone to ensure availability of phone/video source, confirm preferred email & phone number, and discuss instructions and expectations.  I reminded [name] to be prepared with any vital sign and/or heart rhythm information that could potentially be obtained via home monitoring, at the time of her visit. I reminded [name] to expect a phone call prior to  her visit.  Dyane Dustman 09/18/2018 2:34 PM

## 2018-09-23 ENCOUNTER — Encounter: Payer: Self-pay | Admitting: Cardiovascular Disease

## 2018-09-23 ENCOUNTER — Other Ambulatory Visit: Payer: Self-pay

## 2018-09-23 ENCOUNTER — Telehealth (INDEPENDENT_AMBULATORY_CARE_PROVIDER_SITE_OTHER): Payer: PPO | Admitting: Cardiovascular Disease

## 2018-09-23 VITALS — BP 135/80 | HR 75 | Ht 63.0 in | Wt 150.0 lb

## 2018-09-23 DIAGNOSIS — I712 Thoracic aortic aneurysm, without rupture: Secondary | ICD-10-CM

## 2018-09-23 DIAGNOSIS — I10 Essential (primary) hypertension: Secondary | ICD-10-CM

## 2018-09-23 DIAGNOSIS — I480 Paroxysmal atrial fibrillation: Secondary | ICD-10-CM

## 2018-09-23 DIAGNOSIS — I4891 Unspecified atrial fibrillation: Secondary | ICD-10-CM

## 2018-09-23 DIAGNOSIS — R778 Other specified abnormalities of plasma proteins: Secondary | ICD-10-CM

## 2018-09-23 DIAGNOSIS — I5032 Chronic diastolic (congestive) heart failure: Secondary | ICD-10-CM

## 2018-09-23 DIAGNOSIS — R7989 Other specified abnormal findings of blood chemistry: Secondary | ICD-10-CM

## 2018-09-23 DIAGNOSIS — I7121 Aneurysm of the ascending aorta, without rupture: Secondary | ICD-10-CM

## 2018-09-23 DIAGNOSIS — I2510 Atherosclerotic heart disease of native coronary artery without angina pectoris: Secondary | ICD-10-CM

## 2018-09-23 MED ORDER — METOPROLOL SUCCINATE ER 25 MG PO TB24: 25.0000 mg | ORAL_TABLET | Freq: Every day | ORAL | 3 refills | Status: DC

## 2018-09-23 NOTE — Addendum Note (Signed)
Addended by: Marlyn Corporal A on: 09/23/2018 01:20 PM   Modules accepted: Orders

## 2018-09-23 NOTE — Progress Notes (Addendum)
Virtual Visit via Telephone Note   This visit type was conducted due to national recommendations for restrictions regarding the COVID-19 Pandemic (e.g. social distancing) in an effort to limit this patient's exposure and mitigate transmission in our community.  Due to her co-morbid illnesses, this patient is at least at moderate risk for complications without adequate follow up.  This format is felt to be most appropriate for this patient at this time.  The patient did not have access to video technology/had technical difficulties with video requiring transitioning to audio format only (telephone).  All issues noted in this document were discussed and addressed.  No physical exam could be performed with this format.  Please refer to the patient's chart for her  consent to telehealth for [facility].   Date:  09/23/2018   ID:  [name], DOB [birth year], MRN [id number]  Patient Location: Home Provider Location: Office  PCP:  Benita Stabile, MD  Cardiologist:  Prentice Docker, MD  Electrophysiologist:  None   Evaluation Performed:  Follow-Up Visit  Chief Complaint:  PAF, chronic diastolic HF  History of Present Illness:    [name] is a 85 y.o. female with a history of paroxysmal atrial fibrillation, chronic diastolic heart failure, hypertension, and ascending thoracic aortic aneurysm.  Stable 5 cm ascending aortic aneurysm by CT on 09/08/18. She follows with CT surgery.  She's been writing cards to spend the time.   She denies chest pain, leg swelling, and palpitations. She only gets short of breath when tying her shoes.  The patient does not have symptoms concerning for COVID-19 infection (fever, chills, cough, or new shortness of breath).  I personally reviewed ECG performed on 02/26/2018 which demonstrated sinus rhythm with mild nonspecific T wave abnormalities.  Soc Hx: Her daughter is Oceanographer. [name] has 3 children, adaughter who is an Technical sales engineer in [place],  [place], one son who works for a company in [employer] wife is Melina Modena (marketing at [employer]), and another son, Overton Mam, who is a Training and development officer for [employer]. 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    Past Medical History:  Diagnosis Date  . Anemia   . Ascending aortic aneurysm (HCC)    a. 4.6cm by CT 07/2016.  Marland Kitchen Chronic diastolic CHF (congestive heart failure) (HCC)   . Coronary artery calcification seen on CT scan 07/2016  . History of blood transfusion   . Hypertension   . Hypomagnesemia   . PAF (paroxysmal atrial fibrillation) (HCC)    a. dx during adm 08/2016.  Marland Kitchen 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   Negative Jak2, BCR/ABL, normal epo level on 02/01/2014  . Sepsis (HCC) 08/2016  . Thrombocytopenia (HCC)   . Trigeminal neuralgia    Past Surgical History:  Procedure Laterality Date  . BREAST CYST EXCISION  60 yrs ago  . COLONOSCOPY WITH ESOPHAGOGASTRODUODENOSCOPY (EGD) N/A 03/10/2013   Procedure: COLONOSCOPY WITH ESOPHAGOGASTRODUODENOSCOPY (EGD);  Surgeon: Malissa Hippo, MD;  Location: [facility];  Service: Endoscopy;  Laterality: N/A;  730  . EXTRACORPOREAL SHOCK WAVE LITHOTRIPSY Right 09/20/2016   Procedure: RIGHT EXTRACORPOREAL SHOCK WAVE LITHOTRIPSY (ESWL);  Surgeon: Alfredo Martinez, MD;  Location: [facility];  Service: Urology;  Laterality: Right;  . EYE SURGERY     cataract surgery bilat   . Gamma Knife     Trigeminal   . SLT LASER APPLICATION Left 09/27/2014   Procedure: SLT LASER APPLICATION;  Surgeon: Susa Simmonds, MD;  Location: [facility];  Service: Ophthalmology;  Laterality: Left;     Current Meds  Medication Sig  . Calcium Carbonate-Vitamin D (CALCIUM 600+D) 600-400 MG-UNIT tablet Take 1 tablet by mouth daily.  . Cinnamon 500 MG capsule Take 1,000 mg by mouth daily.  . Coenzyme Q10 (COQ10) 100 MG CAPS Take 200 mg by mouth daily.   Marland Kitchen. diltiazem (CARDIZEM CD) 120 MG 24 hr capsule Take 1 capsule (120 mg total) by mouth  daily.  Marland Kitchen. ELIQUIS 5 MG TABS tablet TAKE (1) TABLET BY MOUTH TWICE DAILY.  . ferrous sulfate 325 (65 FE) MG tablet Take 325 mg by mouth every other day.  . furosemide (LASIX) 20 MG tablet Take 1 tablet (20 mg total) by mouth daily as needed.  . gabapentin (NEURONTIN) 300 MG capsule Take 300 mg by mouth at bedtime.   . metoprolol succinate (TOPROL-XL) 25 MG 24 hr tablet Take 1 tablet (25 mg total) by mouth daily.  . Multiple Vitamin (MULTIVITAMIN WITH MINERALS) TABS tablet Take 1 tablet by mouth daily.  . NON FORMULARY Take 250 mg by mouth daily. Keratin  . omega-3 acid ethyl esters (LOVAZA) 1 g capsule Take 1 g by mouth 2 (two) times daily.  . pantoprazole (PROTONIX) 40 MG tablet TAKE (1) TABLET BY MOUTH ONCE DAILY.  Marland Kitchen. Plant Sterol Stanol-Pantethine 450-75 MG TABS Take 1 tablet by mouth daily.   . pravastatin (PRAVACHOL) 40 MG tablet Take 40 mg by mouth at bedtime.   Marland Kitchen. Specialty Vitamins Products (BIOTIN PLUS KERATIN PO) Take 250 mg by mouth.  . timolol (TIMOPTIC) 0.5 % ophthalmic solution Place 1 drop into both eyes daily.     Allergies:   Sulfa antibiotics   Social History   Tobacco Use  . Smoking status: Never Smoker  . Smokeless tobacco: Never Used  Substance Use Topics  . Alcohol use: No  . Drug use: No     Family Hx: The patient's family history includes Congestive Heart Failure in her mother; Heart attack in her father. There is no history of CAD.  ROS:   Please see the history of present illness.     All other systems reviewed and are negative.   Prior CV studies:   The following studies were reviewed today:  NA  Labs/Other Tests and Data Reviewed:    EKG:  No ECG reviewed.  Recent Labs: No results found for requested labs within last 8760 hours.   Recent Lipid Panel No results found for: CHOL, TRIG, HDL, CHOLHDL, LDLCALC, LDLDIRECT  Wt Readings from Last 3 Encounters:  09/23/18 150 lb (68 kg)  09/09/18 150 lb (68 kg)  05/14/18 152 lb (68.9 kg)      Objective:    Vital Signs:  BP 135/80   Pulse 75   Ht 5\' 3"  (1.6 m)   Wt 150 lb (68 kg)   BMI 26.57 kg/m    VITAL SIGNS:  reviewed  ASSESSMENT & PLAN:    1. Paroxysmal atrial fibrillation:Symptomatically stable.Continue Eliquis for anticoagulation. Continue long-acting diltiazem 120 mg daily.She is also on Toprol-XL 25 mg daily.  2. Elevated troponin level/demand ischemia/coronary artery calcifications seen on CT: Asymptomatic. Noindication forstress testing at this time.  3. Chronic diastolic heart failure: Euvolemic. Continue Lasix 20 mg daily.  4. Ascending thoracic aortic aneurysm: Repeat CTAin May 2020 reviewed above anddemonstrated stability at 5 cm. She was started on Toprol-XL 25 mg daily by CT surgery who is monitoring her aneurysm.  5. Hypertension: Controlled on present therapy. No changes.   COVID-19  Education: The signs and symptoms of COVID-19 were discussed with the patient and how to seek care for testing (follow up with PCP or arrange E-visit).  The importance of social distancing was discussed today.  Time:   Today, I have spent 15 minutes with the patient with telehealth technology discussing the above problems.     Medication Adjustments/Labs and Tests Ordered: Current medicines are reviewed at length with the patient today.  Concerns regarding medicines are outlined above.   Tests Ordered: No orders of the defined types were placed in this encounter.   Medication Changes: No orders of the defined types were placed in this encounter.   Disposition:  Follow up in 6 month(s)  Signed, Prentice Docker, MD  09/23/2018 1:08 PM    [facility]

## 2018-09-23 NOTE — Patient Instructions (Signed)
Medication Instructions: Your physician recommends that you continue on your current medications as directed. Please refer to the Current Medication list given to you today.   Labwork: none  Procedures/Testing: nione  Follow-Up: 6 months with Dr.koneswaran  Any Additional Special Instructions Will Be Listed Below (If Applicable).     If you need a refill on your cardiac medications before your next appointment, please call your pharmacy.     Thank you for choosing [facility] !

## 2018-10-31 ENCOUNTER — Other Ambulatory Visit (HOSPITAL_COMMUNITY): Payer: Self-pay | Admitting: Urology

## 2018-10-31 ENCOUNTER — Other Ambulatory Visit: Payer: Self-pay

## 2018-10-31 ENCOUNTER — Ambulatory Visit (HOSPITAL_COMMUNITY)
Admission: RE | Admit: 2018-10-31 | Discharge: 2018-10-31 | Disposition: A | Discharge status: Home / Self Care | Payer: PPO | Source: Ambulatory Visit | Attending: Urology | Admitting: Urology

## 2018-10-31 ENCOUNTER — Telehealth: Payer: Self-pay | Admitting: Cardiovascular Disease

## 2018-10-31 DIAGNOSIS — I482 Chronic atrial fibrillation, unspecified: Secondary | ICD-10-CM | POA: Diagnosis not present

## 2018-10-31 DIAGNOSIS — N200 Calculus of kidney: Secondary | ICD-10-CM | POA: Diagnosis not present

## 2018-10-31 DIAGNOSIS — E782 Mixed hyperlipidemia: Secondary | ICD-10-CM | POA: Diagnosis not present

## 2018-10-31 DIAGNOSIS — I4891 Unspecified atrial fibrillation: Secondary | ICD-10-CM

## 2018-10-31 DIAGNOSIS — I10 Essential (primary) hypertension: Secondary | ICD-10-CM | POA: Diagnosis not present

## 2018-10-31 DIAGNOSIS — D509 Iron deficiency anemia, unspecified: Secondary | ICD-10-CM | POA: Diagnosis not present

## 2018-10-31 DIAGNOSIS — R7301 Impaired fasting glucose: Secondary | ICD-10-CM | POA: Diagnosis not present

## 2018-10-31 MED ORDER — METOPROLOL SUCCINATE ER 25 MG PO TB24: 25.0000 mg | ORAL_TABLET | Freq: Every day | ORAL | 3 refills | Status: DC

## 2018-10-31 NOTE — Telephone Encounter (Signed)
Done

## 2018-10-31 NOTE — Telephone Encounter (Signed)
Requesting 90 day supply of Metoprolol be sent to [facility]/tg

## 2018-11-04 DIAGNOSIS — K219 Gastro-esophageal reflux disease without esophagitis: Secondary | ICD-10-CM | POA: Diagnosis not present

## 2018-11-04 DIAGNOSIS — D509 Iron deficiency anemia, unspecified: Secondary | ICD-10-CM | POA: Diagnosis not present

## 2018-11-04 DIAGNOSIS — I482 Chronic atrial fibrillation, unspecified: Secondary | ICD-10-CM | POA: Diagnosis not present

## 2018-11-04 DIAGNOSIS — R7301 Impaired fasting glucose: Secondary | ICD-10-CM | POA: Diagnosis not present

## 2018-11-04 DIAGNOSIS — R0602 Shortness of breath: Secondary | ICD-10-CM | POA: Diagnosis not present

## 2018-11-04 DIAGNOSIS — R944 Abnormal results of kidney function studies: Secondary | ICD-10-CM | POA: Diagnosis not present

## 2018-11-04 DIAGNOSIS — E782 Mixed hyperlipidemia: Secondary | ICD-10-CM | POA: Diagnosis not present

## 2018-11-18 ENCOUNTER — Other Ambulatory Visit (HOSPITAL_COMMUNITY)
Admission: AD | Admit: 2018-11-18 | Discharge: 2018-11-18 | Disposition: A | Discharge status: Home / Self Care | Payer: PPO | Source: Other Acute Inpatient Hospital | Attending: Urology | Admitting: Urology

## 2018-11-18 ENCOUNTER — Ambulatory Visit (INDEPENDENT_AMBULATORY_CARE_PROVIDER_SITE_OTHER): Payer: PPO | Admitting: Urology

## 2018-11-18 DIAGNOSIS — N200 Calculus of kidney: Secondary | ICD-10-CM | POA: Diagnosis not present

## 2018-11-20 LAB — URINE CULTURE: Culture: 20000 — AB

## 2018-12-01 DIAGNOSIS — N209 Urinary calculus, unspecified: Secondary | ICD-10-CM | POA: Diagnosis not present

## 2018-12-01 DIAGNOSIS — I10 Essential (primary) hypertension: Secondary | ICD-10-CM | POA: Diagnosis not present

## 2018-12-01 DIAGNOSIS — D509 Iron deficiency anemia, unspecified: Secondary | ICD-10-CM | POA: Diagnosis not present

## 2018-12-01 DIAGNOSIS — N390 Urinary tract infection, site not specified: Secondary | ICD-10-CM | POA: Diagnosis not present

## 2018-12-01 DIAGNOSIS — G500 Trigeminal neuralgia: Secondary | ICD-10-CM | POA: Diagnosis not present

## 2018-12-01 DIAGNOSIS — K219 Gastro-esophageal reflux disease without esophagitis: Secondary | ICD-10-CM | POA: Diagnosis not present

## 2018-12-01 DIAGNOSIS — Z6825 Body mass index (BMI) 25.0-25.9, adult: Secondary | ICD-10-CM | POA: Diagnosis not present

## 2018-12-01 DIAGNOSIS — I482 Chronic atrial fibrillation, unspecified: Secondary | ICD-10-CM | POA: Diagnosis not present

## 2018-12-01 DIAGNOSIS — Z0000 Encounter for general adult medical examination without abnormal findings: Secondary | ICD-10-CM | POA: Diagnosis not present

## 2018-12-01 DIAGNOSIS — R0602 Shortness of breath: Secondary | ICD-10-CM | POA: Diagnosis not present

## 2018-12-01 DIAGNOSIS — M25552 Pain in left hip: Secondary | ICD-10-CM | POA: Diagnosis not present

## 2018-12-01 DIAGNOSIS — E782 Mixed hyperlipidemia: Secondary | ICD-10-CM | POA: Diagnosis not present

## 2018-12-01 DIAGNOSIS — Z23 Encounter for immunization: Secondary | ICD-10-CM | POA: Diagnosis not present

## 2018-12-12 ENCOUNTER — Other Ambulatory Visit (HOSPITAL_COMMUNITY): Payer: Self-pay | Admitting: Adult Health Nurse Practitioner

## 2018-12-12 ENCOUNTER — Ambulatory Visit (HOSPITAL_COMMUNITY)
Admission: RE | Admit: 2018-12-12 | Discharge: 2018-12-12 | Disposition: A | Discharge status: Home / Self Care | Payer: PPO | Source: Ambulatory Visit | Attending: Adult Health Nurse Practitioner | Admitting: Adult Health Nurse Practitioner

## 2018-12-12 ENCOUNTER — Other Ambulatory Visit: Payer: Self-pay

## 2018-12-12 DIAGNOSIS — R0602 Shortness of breath: Secondary | ICD-10-CM | POA: Insufficient documentation

## 2018-12-15 DIAGNOSIS — R0602 Shortness of breath: Secondary | ICD-10-CM | POA: Diagnosis not present

## 2018-12-15 DIAGNOSIS — K219 Gastro-esophageal reflux disease without esophagitis: Secondary | ICD-10-CM | POA: Diagnosis not present

## 2018-12-15 DIAGNOSIS — R944 Abnormal results of kidney function studies: Secondary | ICD-10-CM | POA: Diagnosis not present

## 2018-12-15 DIAGNOSIS — E782 Mixed hyperlipidemia: Secondary | ICD-10-CM | POA: Diagnosis not present

## 2018-12-15 DIAGNOSIS — Z0000 Encounter for general adult medical examination without abnormal findings: Secondary | ICD-10-CM | POA: Diagnosis not present

## 2018-12-15 DIAGNOSIS — R7301 Impaired fasting glucose: Secondary | ICD-10-CM | POA: Diagnosis not present

## 2018-12-15 DIAGNOSIS — N390 Urinary tract infection, site not specified: Secondary | ICD-10-CM | POA: Diagnosis not present

## 2018-12-15 DIAGNOSIS — I10 Essential (primary) hypertension: Secondary | ICD-10-CM | POA: Diagnosis not present

## 2018-12-15 DIAGNOSIS — I482 Chronic atrial fibrillation, unspecified: Secondary | ICD-10-CM | POA: Diagnosis not present

## 2018-12-15 DIAGNOSIS — G500 Trigeminal neuralgia: Secondary | ICD-10-CM | POA: Diagnosis not present

## 2018-12-15 DIAGNOSIS — D509 Iron deficiency anemia, unspecified: Secondary | ICD-10-CM | POA: Diagnosis not present

## 2018-12-15 DIAGNOSIS — R1310 Dysphagia, unspecified: Secondary | ICD-10-CM | POA: Diagnosis not present

## 2018-12-15 DIAGNOSIS — N209 Urinary calculus, unspecified: Secondary | ICD-10-CM | POA: Diagnosis not present

## 2018-12-18 NOTE — Progress Notes (Signed)
Subjective:     Patient ID: [name], female    DOB: 02-13-1929, 85 y.o.   MRN: [id number]  HPI  [name]. Barba is an 85 year old female with a past medical history of HTN, diastolic CHF, paroxysmal atrial fibrillation on Eliquis, IDA anemia dx'd in 2014, + H. Pylori 2014, negative evaluation for secondary polycythemia by hematologist Dr. Farrel Gobble 01/2014), ascending aortic aneurysm, kidney stones and trigeminal neuralgia. She takes Pantoprazole 40mg  once daily 3 to 4 days weekly. She stated her PCP recommended she reduce the frequency. She presents today with complaints of having difficulty swallowing, food doesn't go down, foods such as apple or steak will get stuck to the upper esophagus. She drinks water and the food passes. No episodes of dysphagia for the past week. She reports having 4 to 5 times of food getting stuck over the past month. Last EGD was in 2014 which showed reflux esophagitis and a stricture at the GE junction which was dilated. She reports passing a normal solid stool once daily. No rectal bleeding or melena. Last colonoscopy was in 2014 which showed sigmoid diverticulosis.  Chest CT angiogram 09/08/2018 for ascending aneurysm surveillance:  Cardiovascular: Stable ascending aortic aneurysm measuring 5 cm in greatest dimension. No dissection is seen. Normal tapering is noted in the arch and descending thoracic aorta. Scattered atherosclerotic calcifications are noted without dissection. Heart is within normal limits. Mild coronary calcifications are seen. Pulmonary artery is within normal limits. Mediastinum/Nodes: Thoracic inlet is within normal limits. No significant hilar or mediastinal adenopathy is noted. Sliding-type hiatal hernia is noted with approximately 50% of the stomach within the chest cavity. Mild atelectatic changes Nonobstructing right renal stone  03/10/2013 EGD: Erosive reflux esophagitis with stricture at GE junction which was dilated  with a balloon dilator to 18 mm. Moderate size sliding hiatal hernia. Small ulcer at gastric body along with antral erosions.  03/10/2013 Colonoscopy:   Normal colonoscopy except few diverticula at sigmoid colon. Suspect patient has been losing blood from upper GI tract.   Past Medical History:  Diagnosis Date  . Anemia   . Ascending aortic aneurysm ([place])    a. 4.6cm by CT 07/2016.  Marland Kitchen Chronic diastolic CHF (congestive heart failure) ([place])   . Coronary artery calcification seen on CT scan 07/2016  . History of blood transfusion   . Hypertension   . Hypomagnesemia   . PAF (paroxysmal atrial fibrillation) ([place])    a. dx during adm 08/2016.  Marland Kitchen 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   Negative Jak2, BCR/ABL, normal epo level on 02/01/2014  . Sepsis ([place]) 08/2016  . Thrombocytopenia ([place])   . Trigeminal neuralgia    Past Surgical History:  Procedure Laterality Date  . BREAST CYST EXCISION  60 yrs ago  . COLONOSCOPY WITH ESOPHAGOGASTRODUODENOSCOPY (EGD) N/A 03/10/2013   Procedure: COLONOSCOPY WITH ESOPHAGOGASTRODUODENOSCOPY (EGD);  Surgeon: Rogene Houston, MD;  [facility];  Service: Endoscopy;  Laterality: N/A;  730  . EXTRACORPOREAL SHOCK WAVE LITHOTRIPSY Right 09/20/2016   Procedure: RIGHT EXTRACORPOREAL SHOCK WAVE LITHOTRIPSY (ESWL);  Surgeon: Bjorn Loser, MD;  Location: [facility];  Service: Urology;  Laterality: Right;  . EYE SURGERY     cataract surgery bilat   . Gamma Knife     Trigeminal   . SLT LASER APPLICATION Left 4/0/9811   Procedure: SLT LASER APPLICATION;  Surgeon: Williams Che, MD;  Location: [facility];  Service: Ophthalmology;  Laterality: Left;   Current Outpatient Medications on File Prior to  Visit  Medication Sig Dispense Refill  . Calcium Carbonate-Vitamin D (CALCIUM 600+D) 600-400 MG-UNIT tablet Take 1 tablet by mouth daily.    . Cinnamon 500 MG capsule Take 1,000 mg by mouth daily.    . Coenzyme Q10 (COQ10) 100 MG CAPS Take 200 mg by mouth daily.      Marland Kitchen diltiazem (CARDIZEM CD) 120 MG 24 hr capsule Take 1 capsule (120 mg total) by mouth daily. 30 capsule 11  . ELIQUIS 5 MG TABS tablet TAKE (1) TABLET BY MOUTH TWICE DAILY. 180 tablet 3  . ferrous sulfate 325 (65 FE) MG tablet Take 325 mg by mouth every other day.    . furosemide (LASIX) 20 MG tablet Take 1 tablet (20 mg total) by mouth daily as needed. 30 tablet 11  . gabapentin (NEURONTIN) 300 MG capsule Take 300 mg by mouth at bedtime.     . metoprolol succinate (TOPROL-XL) 25 MG 24 hr tablet Take 1 tablet (25 mg total) by mouth daily. 90 tablet 3  . Multiple Vitamin (MULTIVITAMIN WITH MINERALS) TABS tablet Take 1 tablet by mouth daily.    . NON FORMULARY Take 250 mg by mouth daily. Keratin    . omega-3 acid ethyl esters (LOVAZA) 1 g capsule Take 1 g by mouth 2 (two) times daily.    . pantoprazole (PROTONIX) 40 MG tablet TAKE (1) TABLET BY MOUTH ONCE DAILY. 30 tablet 5  . Plant Sterol Stanol-Pantethine 450-75 MG TABS Take 1 tablet by mouth daily.     . pravastatin (PRAVACHOL) 40 MG tablet Take 40 mg by mouth at bedtime.     Marland Kitchen Specialty Vitamins Products (BIOTIN PLUS KERATIN PO) Take 250 mg by mouth.    . timolol (TIMOPTIC) 0.5 % ophthalmic solution Place 1 drop into both eyes daily.     No current facility-administered medications on file prior to visit.    Allergies  Allergen Reactions  . Sulfa Antibiotics Other (See Comments)    Pt states that this med makes her feel crazy.     Review of Systems  See HPI, all other systems reviewed and are negative     Objective:   Physical Exam  BP (!) 155/87   Pulse 77   Temp 98.2 F (36.8 C)   Ht 5\' 3"  (1.6 m)   Wt 150 lb 4.8 oz (68.2 kg)   BMI 26.62 kg/m   General: 85 y.o. female ambulating slowly with the assistance of a cane in NAD Eyes: sclera nonicteric, conjunctiva pink Mouth: dentition intact, lower partial plate Neck: supple, no thyromegaly or lymphadenopathy  Heart: irregular rhythm, no murmur Lungs: clear throughout  Abdomen: soft, nontender, no masses or organomegaly  Extremities: bilateral ankles with trace edema Neuro: alert and oriented x 4, speech is clear    Assessment & Plan:   26. 85 year old female with a hx or reflux esophagitis and a esophageal stricture at the GE junction presents with complaints of dysphagia -Increase Pantoprazole 40mg  one capsule by mouth once daily  -EGD discussed, patient prefers to avoid endoscopic evaluation unless absolutely necessary, she declined to schedule a barium swallow which is low risk. However, she will call our office if her swallowing difficulties worsen. She will call me in 2 weeks with an update.  -Patient advised to drink 3 sips of tap water before she swallows any food or medication, cut food in small pieces and chew food thoroughly  2. Afib on eliquis, no signs of active GI bleeding  3. Ascending aortic  aneurysm followed by cardiothoracic surgeon, Dr. Charlett LangoSteven Hendrickson

## 2018-12-22 ENCOUNTER — Ambulatory Visit (HOSPITAL_COMMUNITY)
Admission: RE | Admit: 2018-12-22 | Discharge: 2018-12-22 | Disposition: A | Discharge status: Home / Self Care | Payer: PPO | Source: Ambulatory Visit | Attending: Internal Medicine | Admitting: Internal Medicine

## 2018-12-22 ENCOUNTER — Other Ambulatory Visit (HOSPITAL_COMMUNITY): Payer: Self-pay | Admitting: Internal Medicine

## 2018-12-22 ENCOUNTER — Ambulatory Visit (INDEPENDENT_AMBULATORY_CARE_PROVIDER_SITE_OTHER): Payer: PPO | Admitting: Nurse Practitioner

## 2018-12-22 ENCOUNTER — Other Ambulatory Visit: Payer: Self-pay

## 2018-12-22 ENCOUNTER — Encounter (INDEPENDENT_AMBULATORY_CARE_PROVIDER_SITE_OTHER): Payer: Self-pay | Admitting: Nurse Practitioner

## 2018-12-22 VITALS — BP 155/87 | HR 77 | Temp 98.2°F | Ht 63.0 in | Wt 150.3 lb

## 2018-12-22 DIAGNOSIS — E782 Mixed hyperlipidemia: Secondary | ICD-10-CM | POA: Diagnosis not present

## 2018-12-22 DIAGNOSIS — R1310 Dysphagia, unspecified: Secondary | ICD-10-CM | POA: Diagnosis not present

## 2018-12-22 DIAGNOSIS — R0602 Shortness of breath: Secondary | ICD-10-CM

## 2018-12-22 DIAGNOSIS — I482 Chronic atrial fibrillation, unspecified: Secondary | ICD-10-CM | POA: Diagnosis not present

## 2018-12-22 DIAGNOSIS — D509 Iron deficiency anemia, unspecified: Secondary | ICD-10-CM | POA: Diagnosis not present

## 2018-12-22 DIAGNOSIS — I10 Essential (primary) hypertension: Secondary | ICD-10-CM | POA: Diagnosis not present

## 2018-12-22 LAB — ECHOCARDIOGRAM COMPLETE
Height: 63 in
Weight: 2404.8 oz

## 2018-12-22 NOTE — Patient Instructions (Signed)
1. Increase Pantoprazole 40mg  one capsule by mouth to be taken once daily  2. Call [facility] NP in 2 weeks with an update, we discussed scheduling a barium swallow study or upper endoscopy if your swallowing difficulties persists or worsen   3. Drink 3 sips of tap water before you swallow any medication or food. Cut your food in small pieces, chew your food thoroughly.

## 2018-12-22 NOTE — Progress Notes (Signed)
*  PRELIMINARY RESULTS* Echocardiogram 2D Echocardiogram has been performed.  [name] 12/22/2018, 3:48 PM

## 2019-01-05 ENCOUNTER — Telehealth (INDEPENDENT_AMBULATORY_CARE_PROVIDER_SITE_OTHER): Payer: Self-pay | Admitting: Nurse Practitioner

## 2019-01-05 NOTE — Telephone Encounter (Signed)
Noted  

## 2019-01-05 NOTE — Telephone Encounter (Signed)
Patient called with progress report - patient stated the pantoprazole has helped and she is feeling better

## 2019-01-07 ENCOUNTER — Other Ambulatory Visit (INDEPENDENT_AMBULATORY_CARE_PROVIDER_SITE_OTHER): Payer: Self-pay | Admitting: Internal Medicine

## 2019-01-13 DIAGNOSIS — K219 Gastro-esophageal reflux disease without esophagitis: Secondary | ICD-10-CM | POA: Diagnosis not present

## 2019-01-13 DIAGNOSIS — D509 Iron deficiency anemia, unspecified: Secondary | ICD-10-CM | POA: Diagnosis not present

## 2019-01-15 DIAGNOSIS — H35371 Puckering of macula, right eye: Secondary | ICD-10-CM | POA: Diagnosis not present

## 2019-01-15 DIAGNOSIS — H25812 Combined forms of age-related cataract, left eye: Secondary | ICD-10-CM | POA: Diagnosis not present

## 2019-01-15 DIAGNOSIS — H40013 Open angle with borderline findings, low risk, bilateral: Secondary | ICD-10-CM | POA: Diagnosis not present

## 2019-01-19 DIAGNOSIS — B351 Tinea unguium: Secondary | ICD-10-CM | POA: Diagnosis not present

## 2019-01-19 DIAGNOSIS — M79675 Pain in left toe(s): Secondary | ICD-10-CM | POA: Diagnosis not present

## 2019-01-19 DIAGNOSIS — I739 Peripheral vascular disease, unspecified: Secondary | ICD-10-CM | POA: Diagnosis not present

## 2019-01-22 DIAGNOSIS — D509 Iron deficiency anemia, unspecified: Secondary | ICD-10-CM | POA: Diagnosis not present

## 2019-01-22 DIAGNOSIS — I482 Chronic atrial fibrillation, unspecified: Secondary | ICD-10-CM | POA: Diagnosis not present

## 2019-01-22 DIAGNOSIS — E782 Mixed hyperlipidemia: Secondary | ICD-10-CM | POA: Diagnosis not present

## 2019-01-22 DIAGNOSIS — I10 Essential (primary) hypertension: Secondary | ICD-10-CM | POA: Diagnosis not present

## 2019-01-27 ENCOUNTER — Other Ambulatory Visit: Payer: Self-pay

## 2019-01-27 ENCOUNTER — Ambulatory Visit (HOSPITAL_COMMUNITY)
Admission: RE | Admit: 2019-01-27 | Discharge: 2019-01-27 | Disposition: A | Discharge status: Home / Self Care | Payer: PPO | Source: Ambulatory Visit | Attending: Urology | Admitting: Urology

## 2019-01-27 ENCOUNTER — Other Ambulatory Visit (HOSPITAL_COMMUNITY): Payer: Self-pay | Admitting: Urology

## 2019-01-27 ENCOUNTER — Ambulatory Visit (INDEPENDENT_AMBULATORY_CARE_PROVIDER_SITE_OTHER): Payer: PPO | Admitting: Urology

## 2019-01-27 ENCOUNTER — Other Ambulatory Visit (HOSPITAL_COMMUNITY)
Admission: AD | Admit: 2019-01-27 | Discharge: 2019-01-27 | Disposition: A | Discharge status: Home / Self Care | Payer: PPO | Source: Skilled Nursing Facility | Attending: *Deleted | Admitting: *Deleted

## 2019-01-27 DIAGNOSIS — R310 Gross hematuria: Secondary | ICD-10-CM | POA: Insufficient documentation

## 2019-01-27 DIAGNOSIS — N200 Calculus of kidney: Secondary | ICD-10-CM | POA: Diagnosis not present

## 2019-01-28 LAB — URINE CULTURE: Culture: NO GROWTH

## 2019-02-03 ENCOUNTER — Other Ambulatory Visit: Payer: Self-pay | Admitting: *Deleted

## 2019-02-03 DIAGNOSIS — I712 Thoracic aortic aneurysm, without rupture, unspecified: Secondary | ICD-10-CM

## 2019-02-10 ENCOUNTER — Other Ambulatory Visit: Payer: Self-pay | Admitting: Urology

## 2019-02-10 IMAGING — CR DG CHEST 1V PORT
1 series · 1 of 1 positions shown · non-contrast
Comparison: None

CLINICAL DATA: Shortness of breath

EXAM:
PORTABLE CHEST 1 VIEW

[ap portable]
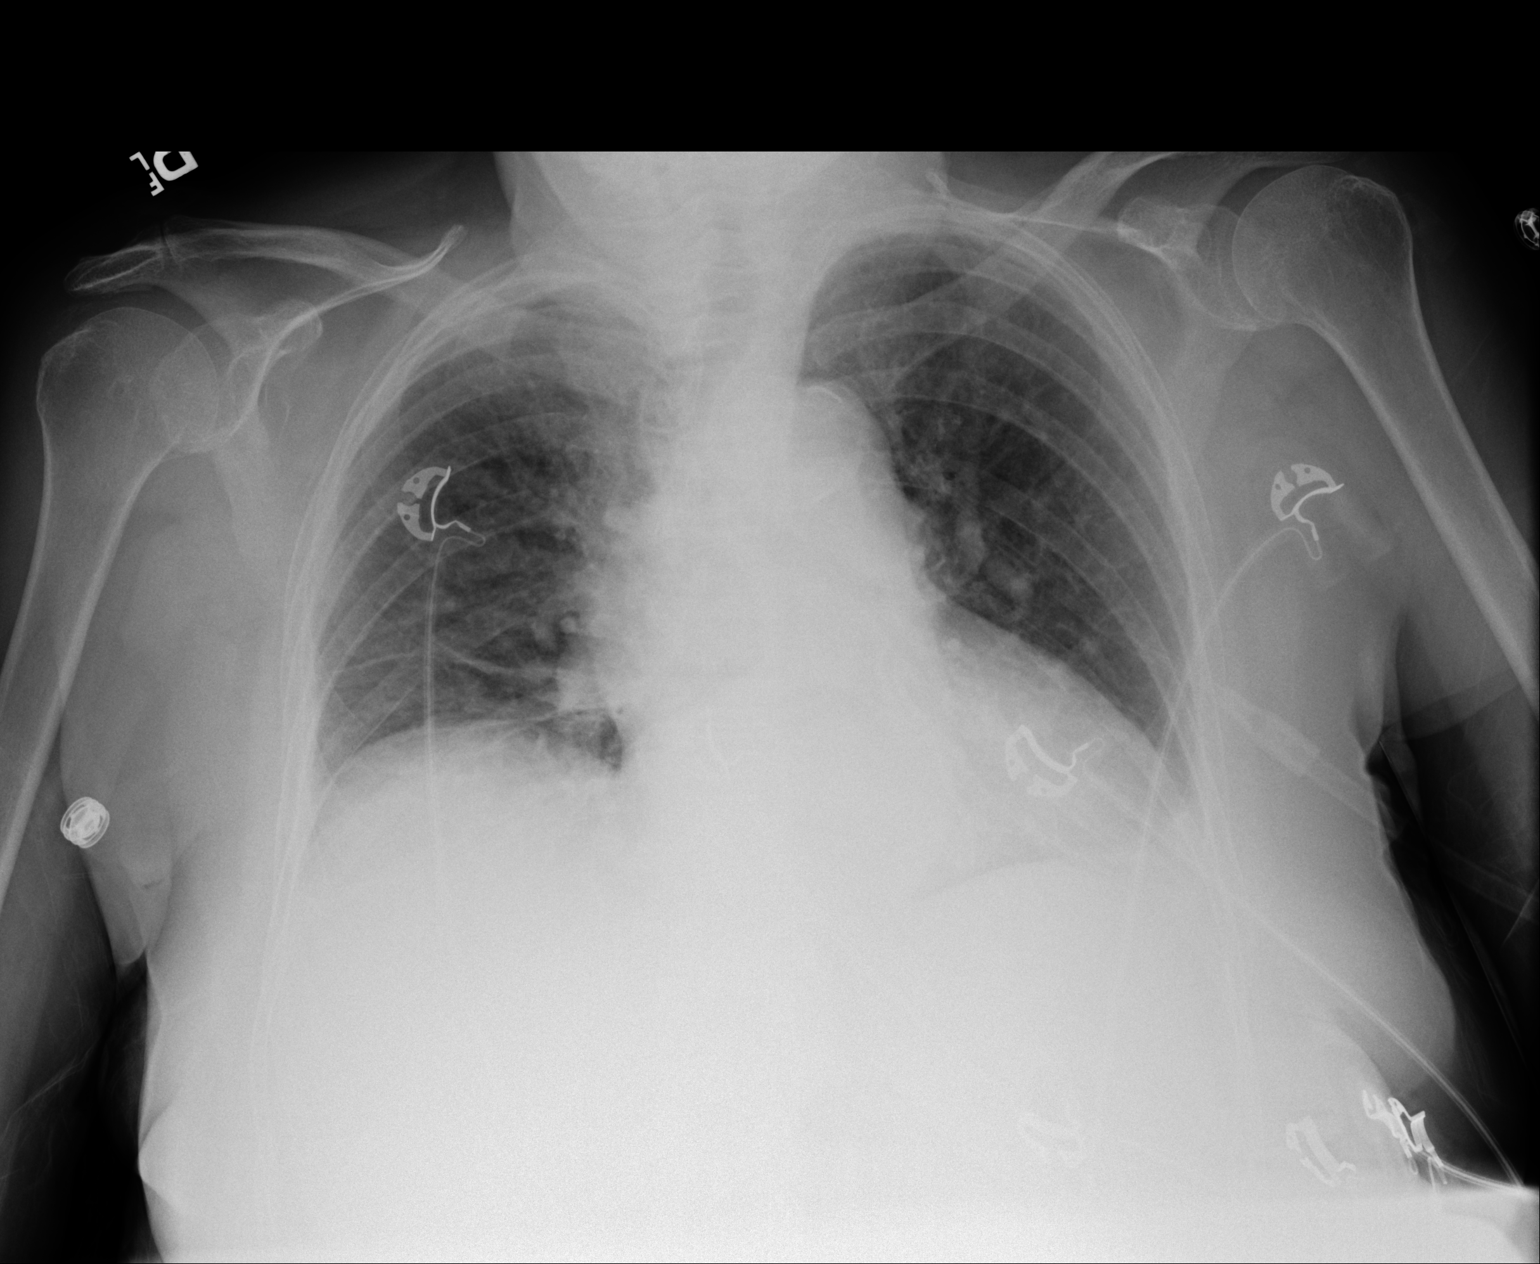

[1 of 1 positions shown; findings below may reference images not displayed]

FINDINGS: The heart size and mediastinal contours are within normal limits.
Aortic atherosclerosis. Decreased lung volumes. Both lungs are
clear. The visualized skeletal structures are unremarkable.
IMPRESSION: 1. Low lung volumes.
2.  Aortic Atherosclerosis ([id number]).

## 2019-02-12 ENCOUNTER — Encounter (HOSPITAL_COMMUNITY): Payer: Self-pay | Admitting: *Deleted

## 2019-02-12 NOTE — Progress Notes (Signed)
Spoke to patient via phone,history obtained,updated.  Bring blue folder,insurance cards,picture ID,designated driver and living will,POA, if desires (to be placed on chart). Reinforced no aspirin(instructions to hold aspirin per your doctor), ibuprofen products 72 hours prior to procedure. No vitamins or herbal medicines 7 days prior to procedure.   Follow laxative instructions provided by urologist (office) and in blue folder. Wear easy on/off clothing and no jewelry except wedding rings and ear rings. Leave all other valuables at home. Verbalizes understanding of instructions  No alcohol 24 hours prior to procedure.call md office if you have a cold,sorethroat or fever. Shower or bathe before your treatment. NPO after midnight. Take your diltiazem with a sip of water the morning of the procedure. The last day of Eliquis will be 02/13/2019 as per your instructions from Dr Dahlstedt's office.

## 2019-02-13 ENCOUNTER — Telehealth: Payer: Self-pay | Admitting: *Deleted

## 2019-02-13 NOTE — Telephone Encounter (Signed)
Noted and Lavella Lemons says she can see this documentation in Epic

## 2019-02-13 NOTE — Telephone Encounter (Signed)
It did not appear I could do an addendum to my office note from 02/26/2018 so I dictated the ECG as an addendum to my note in June 2020.

## 2019-02-13 NOTE — Telephone Encounter (Signed)
[name] from [facility] requesting EKG dictation from 02/26/2018 (says see subjective but isn't noted in office notes) - pt is having a Lithotripsy on Monday and they are requesting Dr Bronson Ing to dictate on that EKG

## 2019-02-16 ENCOUNTER — Ambulatory Visit (HOSPITAL_COMMUNITY): Payer: PPO

## 2019-02-16 ENCOUNTER — Encounter (HOSPITAL_COMMUNITY)
Admission: RE | Disposition: A | Discharge status: Home / Self Care | Payer: Self-pay | Source: Home / Self Care | Attending: Urology

## 2019-02-16 ENCOUNTER — Ambulatory Visit (HOSPITAL_COMMUNITY)
Admission: RE | Admit: 2019-02-16 | Discharge: 2019-02-16 | Disposition: A | Discharge status: Home / Self Care | Payer: PPO | Attending: Urology | Admitting: Urology

## 2019-02-16 ENCOUNTER — Encounter (HOSPITAL_COMMUNITY): Payer: Self-pay

## 2019-02-16 ENCOUNTER — Other Ambulatory Visit: Payer: Self-pay

## 2019-02-16 DIAGNOSIS — Z01818 Encounter for other preprocedural examination: Secondary | ICD-10-CM | POA: Diagnosis not present

## 2019-02-16 DIAGNOSIS — I110 Hypertensive heart disease with heart failure: Secondary | ICD-10-CM | POA: Insufficient documentation

## 2019-02-16 DIAGNOSIS — I480 Paroxysmal atrial fibrillation: Secondary | ICD-10-CM | POA: Insufficient documentation

## 2019-02-16 DIAGNOSIS — Z79899 Other long term (current) drug therapy: Secondary | ICD-10-CM | POA: Insufficient documentation

## 2019-02-16 DIAGNOSIS — I2510 Atherosclerotic heart disease of native coronary artery without angina pectoris: Secondary | ICD-10-CM | POA: Diagnosis not present

## 2019-02-16 DIAGNOSIS — N200 Calculus of kidney: Secondary | ICD-10-CM | POA: Diagnosis not present

## 2019-02-16 DIAGNOSIS — Z87442 Personal history of urinary calculi: Secondary | ICD-10-CM | POA: Insufficient documentation

## 2019-02-16 DIAGNOSIS — I5032 Chronic diastolic (congestive) heart failure: Secondary | ICD-10-CM | POA: Insufficient documentation

## 2019-02-16 DIAGNOSIS — Z8249 Family history of ischemic heart disease and other diseases of the circulatory system: Secondary | ICD-10-CM | POA: Insufficient documentation

## 2019-02-16 DIAGNOSIS — Z7901 Long term (current) use of anticoagulants: Secondary | ICD-10-CM | POA: Insufficient documentation

## 2019-02-16 HISTORY — DX: Cardiac arrhythmia, unspecified: I49.9

## 2019-02-16 HISTORY — PX: EXTRACORPOREAL SHOCK WAVE LITHOTRIPSY: SHX1557

## 2019-02-16 HISTORY — DX: Personal history of urinary calculi: Z87.442

## 2019-02-16 SURGERY — LITHOTRIPSY, ESWL
Anesthesia: LOCAL | Laterality: Right

## 2019-02-16 MED ORDER — DIPHENHYDRAMINE HCL 25 MG PO CAPS
25.0000 mg | ORAL_CAPSULE | ORAL | Status: AC
  Administered 2019-02-16: 25 mg via ORAL
  Filled 2019-02-16: qty 1

## 2019-02-16 MED ORDER — SODIUM CHLORIDE 0.9 % IV SOLN
INTRAVENOUS | Status: DC
  Administered 2019-02-16: 10:00:00 via INTRAVENOUS

## 2019-02-16 MED ORDER — DIAZEPAM 5 MG PO TABS
10.0000 mg | ORAL_TABLET | ORAL | Status: AC
  Administered 2019-02-16: 10 mg via ORAL
  Filled 2019-02-16: qty 2

## 2019-02-16 MED ORDER — CIPROFLOXACIN HCL 500 MG PO TABS
500.0000 mg | ORAL_TABLET | ORAL | Status: AC
  Administered 2019-02-16: 500 mg via ORAL
  Filled 2019-02-16: qty 1

## 2019-02-16 SURGICAL SUPPLY — 4 items
COVER SURGICAL LIGHT HANDLE (MISCELLANEOUS) ×2 IMPLANT
COVER WAND RF STERILE (DRAPES) IMPLANT
KIT TURNOVER KIT A (KITS) IMPLANT
TOWEL OR 17X26 10 PK STRL BLUE (TOWEL DISPOSABLE) ×2 IMPLANT

## 2019-02-16 NOTE — Op Note (Signed)
See [facility] OP note scanned into chart. 

## 2019-02-16 NOTE — Interval H&P Note (Signed)
History and Physical Interval Note:  02/16/2019 11:17 AM  [name]  has presented today for surgery, with the diagnosis of RIGHT RENAL PELVIC STONE.  The various methods of treatment have been discussed with the patient and family. After consideration of risks, benefits and other options for treatment, the patient has consented to  Procedure(s): EXTRACORPOREAL SHOCK WAVE LITHOTRIPSY (ESWL) (Right) as a surgical intervention.  The patient's history has been reviewed, patient examined, no change in status, stable for surgery.  I have reviewed the patient's chart and labs.  Questions were answered to the patient's satisfaction.     Lillette Boxer Satsuki Zillmer

## 2019-02-16 NOTE — Discharge Instructions (Signed)
See [facility] discharge instructions in chart.  OK to restart ALL meds including Eliquis tomorrow

## 2019-02-16 NOTE — H&P (Signed)
H&P  Chief Complaint: Rt kidney stone  History of Present Illness: [name] is a 85 y.o. year old female presenting for ESL to treat an enlarging 10-11 mm rt renal stone w/ hematuria.  Past Medical History:  Diagnosis Date  . Anemia   . Ascending aortic aneurysm ([facility])    a. 4.6cm by CT 07/2016.  Marland Kitchen Chronic diastolic CHF (congestive heart failure) ([facility])   . Coronary artery calcification seen on CT scan 07/2016  . Dysrhythmia    a fib  . History of blood transfusion   . History of kidney stones   . Hypertension   . Hypomagnesemia   . PAF (paroxysmal atrial fibrillation) ([facility])    a. dx during adm 08/2016.  Marland Kitchen 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   Negative Jak2, BCR/ABL, normal epo level on 02/01/2014  . Sepsis ([facility]) 08/2016  . Thrombocytopenia ([facility])   . Trigeminal neuralgia     Past Surgical History:  Procedure Laterality Date  . BREAST CYST EXCISION  60 yrs ago  . COLONOSCOPY WITH ESOPHAGOGASTRODUODENOSCOPY (EGD) N/A 03/10/2013   Procedure: COLONOSCOPY WITH ESOPHAGOGASTRODUODENOSCOPY (EGD);  Surgeon: Malissa Hippo, MD;  [facility];  Service: Endoscopy;  Laterality: N/A;  730  . EXTRACORPOREAL SHOCK WAVE LITHOTRIPSY Right 09/20/2016   Procedure: RIGHT EXTRACORPOREAL SHOCK WAVE LITHOTRIPSY (ESWL);  Surgeon: Alfredo Martinez, MD;  Location: [facility];  Service: Urology;  Laterality: Right;  . EYE SURGERY     cataract surgery bilat   . Gamma Knife     Trigeminal   . SLT LASER APPLICATION Left 09/27/2014   Procedure: SLT LASER APPLICATION;  Surgeon: Susa Simmonds, MD;  Location: [facility];  Service: Ophthalmology;  Laterality: Left;    Home Medications:  Medications Prior to Admission  Medication Sig Dispense Refill  . esomeprazole (NEXIUM) 40 MG capsule Take 40 mg by mouth daily at 12 noon.    . Calcium Carbonate-Vitamin D (CALCIUM 600+D) 600-400 MG-UNIT tablet Take 1 tablet by mouth daily.    . Cinnamon 500 MG capsule Take 1,000 mg by mouth daily.    . Coenzyme  Q10 (COQ10) 100 MG CAPS Take 200 mg by mouth daily.     Marland Kitchen diltiazem (CARDIZEM CD) 120 MG 24 hr capsule Take 1 capsule (120 mg total) by mouth daily. 30 capsule 11  . ELIQUIS 5 MG TABS tablet TAKE (1) TABLET BY MOUTH TWICE DAILY. 180 tablet 3  . ferrous sulfate 325 (65 FE) MG tablet Take 325 mg by mouth every other day.    . furosemide (LASIX) 20 MG tablet Take 1 tablet (20 mg total) by mouth daily as needed. 30 tablet 11  . gabapentin (NEURONTIN) 300 MG capsule Take 300 mg by mouth at bedtime.     . metoprolol succinate (TOPROL-XL) 25 MG 24 hr tablet Take 1 tablet (25 mg total) by mouth daily. 90 tablet 3  . Multiple Vitamin (MULTIVITAMIN WITH MINERALS) TABS tablet Take 1 tablet by mouth daily.    . NON FORMULARY Take 250 mg by mouth daily. Keratin    . omega-3 acid ethyl esters (LOVAZA) 1 g capsule Take 1 g by mouth 2 (two) times daily.    . pantoprazole (PROTONIX) 40 MG tablet TAKE (1) TABLET BY MOUTH ONCE DAILY. 30 tablet 2  . Plant Sterol Stanol-Pantethine 450-75 MG TABS Take 1 tablet by mouth daily.     . pravastatin (PRAVACHOL) 40 MG tablet Take 40 mg by mouth at bedtime.     Marland Kitchen Specialty Vitamins Products (  BIOTIN PLUS KERATIN PO) Take 250 mg by mouth.    . timolol (TIMOPTIC) 0.5 % ophthalmic solution Place 1 drop into both eyes daily.      Allergies:  Allergies  Allergen Reactions  . Sulfa Antibiotics Other (See Comments)    Pt states that this med makes her feel crazy.      Family History  Problem Relation Age of Onset  . Congestive Heart Failure Mother   . Heart attack Father   . CAD Neg Hx     Social History:  reports that she has never smoked. She has never used smokeless tobacco. She reports that she does not drink alcohol or use drugs.  ROS: A complete review of systems was performed.  All systems are negative except for pertinent findings as noted.  Physical Exam:  Vital signs in last 24 hours:   General:  Alert and oriented, No acute distress HEENT:  Normocephalic, atraumatic Neck: No JVD or lymphadenopathy Cardiovascular: Regular rate and rhythm Lungs: Clear bilaterally Abdomen: Soft, nontender, nondistended, no abdominal masses Back: No CVA tenderness Extremities: No edema Neurologic: Grossly intact  Laboratory Data:  No results found for this or any previous visit (from the past 24 hour(s)). No results found for this or any previous visit (from the past 240 hour(s)). Creatinine: No results for input(s): CREATININE in the last 168 hours.  Radiologic Imaging: No results found.  Impression/Assessment:  Rt renal calculus  Plan:  ESL--due to size and location of stone this may be the initial step in a staged treatment  Jorja Loa 02/16/2019, 8:53 AM  Lillette Boxer. Dalonda Simoni MD

## 2019-02-17 ENCOUNTER — Encounter (HOSPITAL_COMMUNITY): Payer: Self-pay | Admitting: Urology

## 2019-02-25 DIAGNOSIS — E782 Mixed hyperlipidemia: Secondary | ICD-10-CM | POA: Diagnosis not present

## 2019-02-25 DIAGNOSIS — I482 Chronic atrial fibrillation, unspecified: Secondary | ICD-10-CM | POA: Diagnosis not present

## 2019-02-25 DIAGNOSIS — D509 Iron deficiency anemia, unspecified: Secondary | ICD-10-CM | POA: Diagnosis not present

## 2019-02-25 DIAGNOSIS — I10 Essential (primary) hypertension: Secondary | ICD-10-CM | POA: Diagnosis not present

## 2019-03-04 ENCOUNTER — Ambulatory Visit
Admission: RE | Admit: 2019-03-04 | Discharge: 2019-03-04 | Disposition: A | Discharge status: Home / Self Care | Payer: PPO | Source: Ambulatory Visit | Attending: Thoracic Surgery (Cardiothoracic Vascular Surgery) | Admitting: Thoracic Surgery (Cardiothoracic Vascular Surgery)

## 2019-03-04 DIAGNOSIS — I10 Essential (primary) hypertension: Secondary | ICD-10-CM | POA: Diagnosis not present

## 2019-03-04 DIAGNOSIS — I712 Thoracic aortic aneurysm, without rupture, unspecified: Secondary | ICD-10-CM

## 2019-03-04 DIAGNOSIS — R7301 Impaired fasting glucose: Secondary | ICD-10-CM | POA: Diagnosis not present

## 2019-03-04 DIAGNOSIS — D509 Iron deficiency anemia, unspecified: Secondary | ICD-10-CM | POA: Diagnosis not present

## 2019-03-04 DIAGNOSIS — E782 Mixed hyperlipidemia: Secondary | ICD-10-CM | POA: Diagnosis not present

## 2019-03-04 MED ORDER — IOPAMIDOL (ISOVUE-370) INJECTION 76%
75.0000 mL | Freq: Once | INTRAVENOUS | Status: AC | PRN
  Administered 2019-03-04: 09:00:00 75 mL via INTRAVENOUS

## 2019-03-09 ENCOUNTER — Ambulatory Visit (HOSPITAL_COMMUNITY)
Admission: RE | Admit: 2019-03-09 | Discharge: 2019-03-09 | Disposition: A | Discharge status: Home / Self Care | Payer: PPO | Source: Ambulatory Visit | Attending: Urology | Admitting: Urology

## 2019-03-09 ENCOUNTER — Other Ambulatory Visit (HOSPITAL_COMMUNITY): Payer: Self-pay | Admitting: Urology

## 2019-03-09 ENCOUNTER — Other Ambulatory Visit: Payer: Self-pay

## 2019-03-09 DIAGNOSIS — N200 Calculus of kidney: Secondary | ICD-10-CM | POA: Diagnosis not present

## 2019-03-09 DIAGNOSIS — K219 Gastro-esophageal reflux disease without esophagitis: Secondary | ICD-10-CM | POA: Diagnosis not present

## 2019-03-09 DIAGNOSIS — Z01419 Encounter for gynecological examination (general) (routine) without abnormal findings: Secondary | ICD-10-CM | POA: Diagnosis not present

## 2019-03-09 DIAGNOSIS — I482 Chronic atrial fibrillation, unspecified: Secondary | ICD-10-CM | POA: Diagnosis not present

## 2019-03-09 DIAGNOSIS — M25562 Pain in left knee: Secondary | ICD-10-CM | POA: Diagnosis not present

## 2019-03-09 DIAGNOSIS — D509 Iron deficiency anemia, unspecified: Secondary | ICD-10-CM | POA: Diagnosis not present

## 2019-03-09 DIAGNOSIS — R7301 Impaired fasting glucose: Secondary | ICD-10-CM | POA: Diagnosis not present

## 2019-03-09 DIAGNOSIS — E782 Mixed hyperlipidemia: Secondary | ICD-10-CM | POA: Diagnosis not present

## 2019-03-09 DIAGNOSIS — R7303 Prediabetes: Secondary | ICD-10-CM | POA: Diagnosis not present

## 2019-03-09 DIAGNOSIS — N1831 Chronic kidney disease, stage 3a: Secondary | ICD-10-CM | POA: Diagnosis not present

## 2019-03-10 ENCOUNTER — Other Ambulatory Visit (HOSPITAL_COMMUNITY)
Admission: RE | Admit: 2019-03-10 | Discharge: 2019-03-10 | Disposition: A | Discharge status: Home / Self Care | Payer: PPO | Source: Ambulatory Visit | Attending: Urology | Admitting: Urology

## 2019-03-10 ENCOUNTER — Ambulatory Visit: Payer: PPO | Admitting: Thoracic Surgery (Cardiothoracic Vascular Surgery)

## 2019-03-10 ENCOUNTER — Ambulatory Visit (INDEPENDENT_AMBULATORY_CARE_PROVIDER_SITE_OTHER): Payer: PPO | Admitting: Urology

## 2019-03-10 ENCOUNTER — Encounter: Payer: Self-pay | Admitting: Thoracic Surgery (Cardiothoracic Vascular Surgery)

## 2019-03-10 ENCOUNTER — Other Ambulatory Visit: Payer: PPO

## 2019-03-10 VITALS — BP 172/93 | HR 83 | Temp 97.9°F | Resp 20 | Ht 63.0 in | Wt 152.0 lb

## 2019-03-10 DIAGNOSIS — N200 Calculus of kidney: Secondary | ICD-10-CM

## 2019-03-10 DIAGNOSIS — I712 Thoracic aortic aneurysm, without rupture, unspecified: Secondary | ICD-10-CM

## 2019-03-10 DIAGNOSIS — I10 Essential (primary) hypertension: Secondary | ICD-10-CM

## 2019-03-10 NOTE — Progress Notes (Signed)
[address]       [place] [id number]             [phone]       HPI: [name] returns for a scheduled follow-up visit  Jetty PeeksMarie [name] is an 85 year old woman with a past history significant for coronary artery disease, chronic diastolic congestive heart failure, ascending aneurysm, polycythemia, hypertension, paroxysmal atrial fibrillation, trigeminal neuralgia, thrombocytopenia, and ascending aortic aneurysm, and nephrolithiasis.  She was found to have a 4.6 cm ascending aneurysm in April 2018.  She has been followed since that time.  In October 2018 4.9 cm, November 2019 5 cm, May 2020 5 cm.  She has been troubled with nephrolithiasis.  She recently had a lithotripsy.  She says there is a residual fragment this can require repeat lithotripsy.  She is not having any chest pain, pressure, or tightness.  She says that her blood pressure at her last 2 doctors visits were in the 110-120 range.  Past Medical History:  Diagnosis Date  . Anemia   . Ascending aortic aneurysm ([facility])    a. 4.6cm by CT 07/2016.  Marland Kitchen. Chronic diastolic CHF (congestive heart failure) ([facility])   . Coronary artery calcification seen on CT scan 07/2016  . Dysrhythmia    a fib  . History of blood transfusion   . History of kidney stones   . Hypertension   . Hypomagnesemia   . PAF (paroxysmal atrial fibrillation) ([facility])    a. dx during adm 08/2016.  Marland Kitchen. 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 Sepsis ([facility]) 08/2016  . Thrombocytopenia ([facility])   . Trigeminal neuralgia     Current Outpatient Medications  Medication Sig Dispense Refill  . Calcium Carbonate-Vitamin D (CALCIUM 600+D) 600-400 MG-UNIT tablet Take 1 tablet by mouth daily.    . Cinnamon 500 MG capsule Take 1,000 mg by mouth daily.    . Coenzyme Q10 (COQ10) 100 MG CAPS Take 200 mg by mouth daily.     Marland Kitchen. diltiazem (CARDIZEM CD) 120 MG 24 hr capsule Take 1 capsule (120 mg total) by  mouth daily. 30 capsule 11  . ELIQUIS 5 MG TABS tablet TAKE (1) TABLET BY MOUTH TWICE DAILY. 180 tablet 3  . esomeprazole (NEXIUM) 40 MG capsule Take 40 mg by mouth daily at 12 noon.    . ferrous sulfate 325 (65 FE) MG tablet Take 325 mg by mouth every other day.    . furosemide (LASIX) 20 MG tablet Take 1 tablet (20 mg total) by mouth daily as needed. 30 tablet 11  . gabapentin (NEURONTIN) 300 MG capsule Take 300 mg by mouth at bedtime.     . metoprolol succinate (TOPROL-XL) 25 MG 24 hr tablet Take 1 tablet (25 mg total) by mouth daily. 90 tablet 3  . Multiple Vitamin (MULTIVITAMIN WITH MINERALS) TABS tablet Take 1 tablet by mouth daily.    . NON FORMULARY Take 250 mg by mouth daily. Keratin    . pantoprazole (PROTONIX) 40 MG tablet TAKE (1) TABLET BY MOUTH ONCE DAILY. 30 tablet 2  . Plant Sterol Stanol-Pantethine 450-75 MG TABS Take 1 tablet by mouth daily.     . pravastatin (PRAVACHOL) 40 MG tablet Take 40 mg by mouth at bedtime.     Marland Kitchen. Specialty Vitamins Products (BIOTIN PLUS KERATIN PO) Take 250 mg by mouth.    . timolol (TIMOPTIC) 0.5 % ophthalmic solution Place 1 drop into both  eyes daily.    Marland Kitchen omega-3 acid ethyl esters (LOVAZA) 1 g capsule Take 1 g by mouth 2 (two) times daily.     No current facility-administered medications for this visit.     Physical Exam BP (!) 172/93 (BP Location: Right Arm, Patient Position: Sitting)   Pulse 83   Temp 97.9 F (36.6 C) (Skin)   Resp 20   Ht 5\' 3"  (1.6 m)   Wt 152 lb (68.9 kg)   SpO2 93% Comment: RA  BMI 26.71 kg/m  85 year old woman in no acute distress Alert and oriented x3 with no focal deficits No carotid bruits Cardiac regular rate and rhythm, no murmur Lungs clear with equal breath sounds bilaterally No peripheral edema  Diagnostic Tests: CT ANGIOGRAPHY CHEST  TECHNIQUE: Multidetector CT imaging through the chest was performed using the standard protocol during bolus administration of intravenous contrast. Multiplanar  reconstructed images and MIPs were obtained and reviewed to evaluate the vascular anatomy.  CONTRAST:  21mL ISOVUE-370 IOPAMIDOL (ISOVUE-370) INJECTION 76%  COMPARISON:  09/08/2018 and previous  FINDINGS: Cardiovascular: Heart size normal. No pericardial effusion. Satisfactory opacification of pulmonary arteries noted, and there is no evidence of pulmonary emboli. Adequate contrast opacification of the thoracic aorta with no evidence of dissection, or stenosis. There is classic 3-vessel brachiocephalic arch anatomy without proximal stenosis. For maximum transverse diameters as follows:  3.2 cm sinuses of Valsalva  2.9 cm sino-tubular junction  5.2 cm mid ascending (previously 5 cm)  3.8 cm distal ascending/proximal arch  2.9 cm distal arch  3.1 cm proximal descending  2.9 cm distal descending  Visualized proximal abdominal aorta has scattered atheromatous plaque without dilatation.  Mediastinum/Nodes: Moderate hiatal hernia. No hilar or mediastinal adenopathy.  Lungs/Pleura: No pleural effusion. No pneumothorax. Linear scarring in the inferior lingula and right middle lobe. Lungs otherwise clear.  Musculoskeletal: Spondylitic changes in the mid and lower thoracic spine. No fracture or worrisome bone lesion.  Upper abdomen: Moderate hiatal hernia. No acute findings.  Review of the MIP images confirms the above findings.  IMPRESSION: 1. 5.2 cm ascending thoracic aortic aneurysm, without complicating features. Recommend semi-annual imaging followup by CTA or MRA and referral to cardiothoracic surgery if not already obtained. This recommendation follows 2010 ACCF/AHA/AATS/ACR/ASA/SCA/SCAI/SIR/STS/SVM Guidelines for the Diagnosis and Management of Patients With Thoracic Aortic Disease. Circulation. 2010; 1212011. Aortic aneurysm NOS (ICD10-I71.9) 2. Moderate hiatal hernia.  Aortic Atherosclerosis (ICD10-I70.0).   Electronically Signed    By: : [id number] M.D.   On: 03/04/2019 11:22 I personally reviewed the CT angiogram and concur with the findings noted above.  I do not see any significant change when compared to her most recent CT.  Impression: [name] is an 85 year old woman  with a past history significant for coronary artery disease, chronic diastolic congestive heart failure, ascending aneurysm, polycythemia, hypertension, paroxysmal atrial fibrillation, trigeminal neuralgia, thrombocytopenia, and ascending aortic aneurysm, and nephrolithiasis.  Ascending aneurysm-measured 4.6 cm when first noted in 2018.  Has increased in size since then.  Currently is around 5 cm.  I will see any change between the most recent film from 6 months ago and her current film.  She needs continued semiannual follow-up.  I do not think she is a candidate for surgical repair.  Hypertension-blood pressure was markedly elevated today.  She says that her last 2 doctors visits that it was well controlled.  She says she is very anxious when she comes to see me.  She is on Toprol-XL and Cardizem CD.  Plan: Return in 6 months with CT angiogram of chest  Melrose Nakayama, MD [facility] [phone]

## 2019-03-16 ENCOUNTER — Other Ambulatory Visit: Payer: Self-pay | Admitting: Urology

## 2019-03-18 DIAGNOSIS — M1712 Unilateral primary osteoarthritis, left knee: Secondary | ICD-10-CM | POA: Diagnosis not present

## 2019-03-18 DIAGNOSIS — M25562 Pain in left knee: Secondary | ICD-10-CM | POA: Diagnosis not present

## 2019-03-18 LAB — CALCULI, WITH PHOTOGRAPH (CLINICAL LAB)
Calcium Oxalate Dihydrate: 60 %
Calcium Oxalate Monohydrate: 40 %
Weight Calculi: 241 mg

## 2019-03-23 ENCOUNTER — Ambulatory Visit (INDEPENDENT_AMBULATORY_CARE_PROVIDER_SITE_OTHER): Payer: PPO | Admitting: Nurse Practitioner

## 2019-03-25 ENCOUNTER — Telehealth: Payer: Self-pay | Admitting: Cardiovascular Disease

## 2019-03-25 NOTE — Telephone Encounter (Signed)

## 2019-03-26 ENCOUNTER — Encounter (HOSPITAL_COMMUNITY): Payer: Self-pay | Admitting: Urology

## 2019-03-31 ENCOUNTER — Encounter (INDEPENDENT_AMBULATORY_CARE_PROVIDER_SITE_OTHER): Payer: Self-pay | Admitting: Internal Medicine

## 2019-03-31 ENCOUNTER — Ambulatory Visit (INDEPENDENT_AMBULATORY_CARE_PROVIDER_SITE_OTHER): Payer: PPO | Admitting: Internal Medicine

## 2019-03-31 ENCOUNTER — Other Ambulatory Visit: Payer: Self-pay

## 2019-03-31 VITALS — Ht 63.0 in | Wt 150.0 lb

## 2019-03-31 DIAGNOSIS — K219 Gastro-esophageal reflux disease without esophagitis: Secondary | ICD-10-CM | POA: Diagnosis not present

## 2019-03-31 DIAGNOSIS — R1319 Other dysphagia: Secondary | ICD-10-CM

## 2019-03-31 DIAGNOSIS — R1310 Dysphagia, unspecified: Secondary | ICD-10-CM

## 2019-03-31 NOTE — Progress Notes (Signed)
Virtual Visit via Telephone Note  Patient had scheduled face-to-face visit today.  Because of Covid-19 pandemic it was decided to proceed with virtual/telephone visit. I connected with [name] on 03/31/19 at  9: 25 AM EST by telephone and verified that I am speaking with the correct person using two identifiers.  Location: Patient: home Provider: office   I discussed the limitations, risks, security and privacy concerns of performing an evaluation and management service by telephone and the availability of in person appointments. I also discussed with the patient that there may be a patient responsible charge related to this service. The patient expressed understanding and agreed to proceed.   History of Present Illness:  Patient is 85 year old female who has chronic GERD known moderate-sized sliding hiatal hernia and history of esophageal stricture which was last dilated back in November 2014.  Patient presented with esophageal dysphagia over 3 months ago.  Patient opted to monitor her symptoms rather than proceed with barium study or EGD.  Patient was switched to pantoprazole.  Patient says it did not work and she is gone back to taking esomeprazole but she does not take it every day.  She is chewing her food thoroughly and eating slowly.  She states she has not had any episode of dysphagia since her last visit.  She remains with good appetite.  Her bowels move daily and she does not have melena or rectal bleeding. Patient tells me that she was recently diagnosed with urolithiasis and underwent lithotripsy x2.  She had blood work last month. Patient states she lives alone.  She does not drive anymore but her son and daughter live close by and check on her frequently.    Current Outpatient Medications:  .  Calcium Carbonate-Vitamin D (CALCIUM 600+D) 600-400 MG-UNIT tablet, Take 1 tablet by mouth daily., Disp: , Rfl:  .  Cinnamon 500 MG capsule, Take 1,000 mg by mouth daily., Disp: ,  Rfl:  .  Coenzyme Q10 (COQ10) 100 MG CAPS, Take 200 mg by mouth daily. , Disp: , Rfl:  .  diltiazem (CARDIZEM CD) 120 MG 24 hr capsule, Take 1 capsule (120 mg total) by mouth daily., Disp: 30 capsule, Rfl: 11 .  ELIQUIS 5 MG TABS tablet, TAKE (1) TABLET BY MOUTH TWICE DAILY., Disp: 180 tablet, Rfl: 3 .  esomeprazole (NEXIUM) 40 MG capsule, Take 1 capsule (40 mg total) by mouth every other day., Disp: , Rfl:  .  ferrous sulfate 325 (65 FE) MG tablet, Take 325 mg by mouth daily. , Disp: , Rfl:  .  furosemide (LASIX) 20 MG tablet, Take 1 tablet (20 mg total) by mouth daily as needed., Disp: 30 tablet, Rfl: 11 .  gabapentin (NEURONTIN) 300 MG capsule, Take 300 mg by mouth at bedtime. , Disp: , Rfl:  .  metoprolol succinate (TOPROL-XL) 25 MG 24 hr tablet, Take 1 tablet (25 mg total) by mouth daily., Disp: 90 tablet, Rfl: 3 .  Multiple Vitamin (MULTIVITAMIN WITH MINERALS) TABS tablet, Take 1 tablet by mouth daily., Disp: , Rfl:  .  NON FORMULARY, Take 250 mg by mouth daily. Keratin, Disp: , Rfl:  .  pravastatin (PRAVACHOL) 40 MG tablet, Take 40 mg by mouth at bedtime. , Disp: , Rfl:  .  Specialty Vitamins Products (BIOTIN PLUS KERATIN PO), Take 250 mg by mouth., Disp: , Rfl:  .  timolol (TIMOPTIC) 0.5 % ophthalmic solution, Place 1 drop into both eyes daily., Disp: , Rfl:    Observations/Objective:  Patient reported  her weight to be 150 pounds.  Lab data from 03/04/2019 WBC 4.4, H&H 13.5 and 42.4 Platelet count 177K Bili 0.3, AP 63, AST 17 and ALT 14, total protein 6.7 and albumin 4.2. Serum calcium 9.4. Electrolytes normal. BUN 15 and creatinine 0.94.   Assessment and Plan:  Chronic GERD complicated by distal esophageal stricture which was last dilated in November 2014.  She also has moderate sliding hiatal hernia.  Given her history she is better off taking PPI on schedule rather than on as needed basis.  She can take esomeprazole 40 mg every other day and if she needs help on off days she  can take famotidine OTC 20 mg daily as needed. As result dysphagia is concerned she may also have esophageal motility disorder given her age.  Need for endoscopic intervention will depend on her clinical course.  Follow Up Instructions:  Patient advised to take esomeprazole 40 mg every other day. Patient will call office if she has more than one episode of dysphagia or an episode of food impaction. Office visit on as-needed basis.  I discussed the assessment and treatment plan with the patient. The patient was provided an opportunity to ask questions and all were answered. The patient agreed with the plan and demonstrated an understanding of the instructions.   The patient was advised to call back or seek an in-person evaluation if the symptoms worsen or if the condition fails to improve as anticipated.  I provided 10 minutes of non-face-to-face time during this encounter.   Hildred Laser, MD

## 2019-04-06 ENCOUNTER — Encounter (HOSPITAL_COMMUNITY): Payer: Self-pay | Admitting: Physician Assistant

## 2019-04-07 ENCOUNTER — Telehealth: Payer: PPO | Admitting: Cardiovascular Disease

## 2019-04-08 ENCOUNTER — Telehealth (INDEPENDENT_AMBULATORY_CARE_PROVIDER_SITE_OTHER): Payer: PPO | Admitting: Cardiovascular Disease

## 2019-04-08 ENCOUNTER — Encounter: Payer: Self-pay | Admitting: Cardiovascular Disease

## 2019-04-08 VITALS — Ht 63.0 in | Wt 150.0 lb

## 2019-04-08 DIAGNOSIS — I5032 Chronic diastolic (congestive) heart failure: Secondary | ICD-10-CM

## 2019-04-08 DIAGNOSIS — I110 Hypertensive heart disease with heart failure: Secondary | ICD-10-CM | POA: Diagnosis not present

## 2019-04-08 DIAGNOSIS — I509 Heart failure, unspecified: Secondary | ICD-10-CM

## 2019-04-08 DIAGNOSIS — I712 Thoracic aortic aneurysm, without rupture: Secondary | ICD-10-CM | POA: Diagnosis not present

## 2019-04-08 DIAGNOSIS — I10 Essential (primary) hypertension: Secondary | ICD-10-CM

## 2019-04-08 DIAGNOSIS — I2510 Atherosclerotic heart disease of native coronary artery without angina pectoris: Secondary | ICD-10-CM

## 2019-04-08 DIAGNOSIS — Z7189 Other specified counseling: Secondary | ICD-10-CM | POA: Diagnosis not present

## 2019-04-08 DIAGNOSIS — I7121 Aneurysm of the ascending aorta, without rupture: Secondary | ICD-10-CM

## 2019-04-08 DIAGNOSIS — R778 Other specified abnormalities of plasma proteins: Secondary | ICD-10-CM

## 2019-04-08 DIAGNOSIS — Z7901 Long term (current) use of anticoagulants: Secondary | ICD-10-CM | POA: Diagnosis not present

## 2019-04-08 DIAGNOSIS — R7989 Other specified abnormal findings of blood chemistry: Secondary | ICD-10-CM

## 2019-04-08 DIAGNOSIS — I480 Paroxysmal atrial fibrillation: Secondary | ICD-10-CM

## 2019-04-08 NOTE — Patient Instructions (Signed)

## 2019-04-08 NOTE — Progress Notes (Signed)
Virtual Visit via Telephone Note   This visit type was conducted due to national recommendations for restrictions regarding the COVID-19 Pandemic (e.g. social distancing) in an effort to limit this patient's exposure and mitigate transmission in our community.  Due to her co-morbid illnesses, this patient is at least at moderate risk for complications without adequate follow up.  This format is felt to be most appropriate for this patient at this time.  The patient did not have access to video technology/had technical difficulties with video requiring transitioning to audio format only (telephone).  All issues noted in this document were discussed and addressed.  No physical exam could be performed with this format.  Please refer to the patient's chart for her  consent to telehealth for [facility].   Date:  04/08/2019   ID:  [name], DOB January 17, 1929, MRN [id number]  Patient Location: Home Provider Location: Home  PCP:  Benita Stabile, MD  Cardiologist:  Prentice Docker, MD  Electrophysiologist:  None   Evaluation Performed:  Follow-Up Visit  Chief Complaint:  PAF, chronic diastolic HF  History of Present Illness:    [name] is a 85 y.o. female with a history of paroxysmal atrial fibrillation, chronic diastolic heart failure, hypertension, and ascending thoracic aortic aneurysm.  Stable 5.2 cm ascending aortic aneurysm by CT on 03/04/19. She follows with CT surgery.  She denies chest pain and palpitations. She takes Lasix every other day for leg swelling.  She has kidney stones and has to undergo lithotripsy.  She's been staying safe and only going to doctor's appointments.  She is frustrated by the land dispute between her daughter, Lynden Ang, and her son, Ree Kida.   Soc Hx: She has a son, Ree Kida. Her daughter is Oceanographer. Lynden Ang has 3 children, adaughter who is an Technical sales engineer in [place], [place], one son who works for a company in [employer] wife is Melina Modena (marketing at [employer]), and another son, Overton Mam, who is a Training and development officer for [employer]. 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  Past Medical History:  Diagnosis Date  . Anemia   . Ascending aortic aneurysm (HCC)    a. 4.6cm by CT 07/2016.  Marland Kitchen Chronic diastolic CHF (congestive heart failure) (HCC)   . Coronary artery calcification seen on CT scan 07/2016  . Dysrhythmia    a fib  . History of blood transfusion   . History of kidney stones   . Hypertension   . Hypomagnesemia   . PAF (paroxysmal atrial fibrillation) (HCC)    a. dx during adm 08/2016.  Marland Kitchen 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   Negative Jak2, BCR/ABL, normal epo level on 02/01/2014  . Sepsis (HCC) 08/2016  . Thrombocytopenia (HCC)   . Trigeminal neuralgia    Past Surgical History:  Procedure Laterality Date  . BREAST CYST EXCISION  60 yrs ago  . COLONOSCOPY WITH ESOPHAGOGASTRODUODENOSCOPY (EGD) N/A 03/10/2013   Procedure: COLONOSCOPY WITH ESOPHAGOGASTRODUODENOSCOPY (EGD);  Surgeon: Malissa Hippo, MD;  [facility];  Service: Endoscopy;  Laterality: N/A;  730  . EXTRACORPOREAL SHOCK WAVE LITHOTRIPSY Right 09/20/2016   Procedure: RIGHT EXTRACORPOREAL SHOCK WAVE LITHOTRIPSY (ESWL);  Surgeon: Alfredo Martinez, MD;  Location: [facility];  Service: Urology;  Laterality: Right;  . EXTRACORPOREAL SHOCK WAVE LITHOTRIPSY Right 02/16/2019   Procedure: EXTRACORPOREAL SHOCK WAVE LITHOTRIPSY (ESWL);  Surgeon: Marcine Matar, MD;  Location: [facility];  Service: Urology;  Laterality: Right;  . EYE SURGERY     cataract  surgery bilat   . Gamma Knife     Trigeminal   . SLT LASER APPLICATION Left 09/27/2014   Procedure: SLT LASER APPLICATION;  Surgeon: [name], MD;  Location: [facility];  Service: Ophthalmology;  Laterality: Left;     Current Meds  Medication Sig  . Calcium Carbonate-Vitamin D (CALCIUM 600+D) 600-400 MG-UNIT tablet Take 1 tablet by mouth daily.  . Cinnamon 500 MG  capsule Take 1,000 mg by mouth daily.  . Coenzyme Q10 (COQ10) 100 MG CAPS Take 200 mg by mouth daily.   Marland Kitchen. diltiazem (CARDIZEM CD) 120 MG 24 hr capsule Take 1 capsule (120 mg total) by mouth daily.  Marland Kitchen. ELIQUIS 5 MG TABS tablet TAKE (1) TABLET BY MOUTH TWICE DAILY.  Marland Kitchen. esomeprazole (NEXIUM) 40 MG capsule Take 1 capsule (40 mg total) by mouth every other day.  . ferrous sulfate 325 (65 FE) MG tablet Take 325 mg by mouth daily.   . furosemide (LASIX) 20 MG tablet Take 1 tablet (20 mg total) by mouth daily as needed.  . gabapentin (NEURONTIN) 300 MG capsule Take 300 mg by mouth at bedtime.   . metoprolol succinate (TOPROL-XL) 25 MG 24 hr tablet Take 1 tablet (25 mg total) by mouth daily.  . Multiple Vitamin (MULTIVITAMIN WITH MINERALS) TABS tablet Take 1 tablet by mouth daily.  . NON FORMULARY Take 250 mg by mouth daily. Keratin  . pravastatin (PRAVACHOL) 40 MG tablet Take 40 mg by mouth at bedtime.   Marland Kitchen. Specialty Vitamins Products (BIOTIN PLUS KERATIN PO) Take 250 mg by mouth.  . timolol (TIMOPTIC) 0.5 % ophthalmic solution Place 1 drop into both eyes daily.     Allergies:   Sulfa antibiotics   Social History   Tobacco Use  . Smoking status: Never Smoker  . Smokeless tobacco: Never Used  Substance Use Topics  . Alcohol use: No  . Drug use: No     Family Hx: The patient's family history includes Congestive Heart Failure in her mother; Heart attack in her father. There is no history of CAD.  ROS:   Please see the history of present illness.     All other systems reviewed and are negative.   Prior CV studies:   The following studies were reviewed today:  NA  Labs/Other Tests and Data Reviewed:    EKG:  No ECG reviewed.  Recent Labs: No results found for requested labs within last 8760 hours.   Recent Lipid Panel No results found for: CHOL, TRIG, HDL, CHOLHDL, LDLCALC, LDLDIRECT  Wt Readings from Last 3 Encounters:  04/08/19 150 lb (68 kg)  03/31/19 150 lb (68 kg)    03/10/19 152 lb (68.9 kg)     Objective:    Vital Signs:  Ht 5\' 3"  (1.6 m)   Wt 150 lb (68 kg)   BMI 26.57 kg/m    VITAL SIGNS:  reviewed  ASSESSMENT & PLAN:    1. Paroxysmal atrial fibrillation:Symptomatically stable.Continue Eliquis for anticoagulation. Continue long-acting diltiazem 120 mg daily.She is also on Toprol-XL 25 mg daily.  2. Elevated troponin level/demand ischemia/coronary artery calcifications seen on CT: Asymptomatic. Noindication forstress testing at this time.  3. Chronic diastolic heart failure: Euvolemic. Continue Lasix 20 mg prn. She takes it every other day for leg swelling.  4. Ascending thoracic aortic aneurysm: Repeat [id number] demonstratedstability at 5.2cm. She was started on Toprol-XL 25 mg daily by CT surgery who is monitoring her aneurysm.  5. Hypertension: No changes.   COVID-19 Education: The  signs and symptoms of COVID-19 were discussed with the patient and how to seek care for testing (follow up with PCP or arrange E-visit).  The importance of social distancing was discussed today.  Time:   Today, I have spent 15 minutes with the patient with telehealth technology discussing the above problems.     Medication Adjustments/Labs and Tests Ordered: Current medicines are reviewed at length with the patient today.  Concerns regarding medicines are outlined above.   Tests Ordered: No orders of the defined types were placed in this encounter.   Medication Changes: No orders of the defined types were placed in this encounter.   Follow Up:  Virtual Visit  in 6 month(s)  Signed, Kate Sable, MD  04/08/2019 9:37 AM    [place]

## 2019-04-14 DIAGNOSIS — D509 Iron deficiency anemia, unspecified: Secondary | ICD-10-CM | POA: Diagnosis not present

## 2019-04-14 DIAGNOSIS — I10 Essential (primary) hypertension: Secondary | ICD-10-CM | POA: Diagnosis not present

## 2019-04-14 DIAGNOSIS — E782 Mixed hyperlipidemia: Secondary | ICD-10-CM | POA: Diagnosis not present

## 2019-04-14 DIAGNOSIS — I482 Chronic atrial fibrillation, unspecified: Secondary | ICD-10-CM | POA: Diagnosis not present

## 2019-04-23 ENCOUNTER — Other Ambulatory Visit: Payer: Self-pay

## 2019-04-23 ENCOUNTER — Other Ambulatory Visit (HOSPITAL_COMMUNITY)
Admission: RE | Admit: 2019-04-23 | Discharge: 2019-04-23 | Disposition: A | Discharge status: Home / Self Care | Payer: PPO | Source: Ambulatory Visit | Attending: Urology | Admitting: Urology

## 2019-04-23 DIAGNOSIS — Z20828 Contact with and (suspected) exposure to other viral communicable diseases: Secondary | ICD-10-CM | POA: Diagnosis not present

## 2019-04-23 DIAGNOSIS — Z01812 Encounter for preprocedural laboratory examination: Secondary | ICD-10-CM | POA: Insufficient documentation

## 2019-04-23 LAB — SARS CORONAVIRUS 2 (TAT 6-24 HRS): SARS Coronavirus 2: NEGATIVE

## 2019-04-26 NOTE — H&P (Signed)
H&P  Chief Complaint: Rt renal stone  History of Present Illness: [name] is a 86 y.o. year old female presenting for ESL mgmt of a residual rt renal stone following ESL of a large renal stone  10.26.2020. That was the 1st part of a staged procedure.  Past Medical History:  Diagnosis Date  . Anemia   . Ascending aortic aneurysm ([place])    a. 4.6cm by CT 07/2016.  Marland Kitchen Chronic diastolic CHF (congestive heart failure) ([place])   . Coronary artery calcification seen on CT scan 07/2016  . Dysrhythmia    a fib  . History of blood transfusion   . History of kidney stones   . Hypertension   . Hypomagnesemia   . PAF (paroxysmal atrial fibrillation) ([place])    a. dx during adm 08/2016.  Marland Kitchen 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   Negative Jak2, BCR/ABL, normal epo level on 02/01/2014  . Sepsis ([place]) 08/2016  . Thrombocytopenia ([place])   . Trigeminal neuralgia     Past Surgical History:  Procedure Laterality Date  . BREAST CYST EXCISION  60 yrs ago  . COLONOSCOPY WITH ESOPHAGOGASTRODUODENOSCOPY (EGD) N/A 03/10/2013   Procedure: COLONOSCOPY WITH ESOPHAGOGASTRODUODENOSCOPY (EGD);  Surgeon: Rogene Houston, MD;  [facility];  Service: Endoscopy;  Laterality: N/A;  730  . EXTRACORPOREAL SHOCK WAVE LITHOTRIPSY Right 09/20/2016   Procedure: RIGHT EXTRACORPOREAL SHOCK WAVE LITHOTRIPSY (ESWL);  Surgeon: Bjorn Loser, MD;  [facility];  Service: Urology;  Laterality: Right;  . EXTRACORPOREAL SHOCK WAVE LITHOTRIPSY Right 02/16/2019   Procedure: EXTRACORPOREAL SHOCK WAVE LITHOTRIPSY (ESWL);  Surgeon: Franchot Gallo, MD;  [facility];  Service: Urology;  Laterality: Right;  . EYE SURGERY     cataract surgery bilat   . Gamma Knife     Trigeminal   . SLT LASER APPLICATION Left 1/0/9323   Procedure: SLT LASER APPLICATION;  Surgeon: Williams Che, MD;  Location: [facility];  Service: Ophthalmology;  Laterality: Left;    Home Medications:  No medications prior to admission.     Allergies:  Allergies  Allergen Reactions  . Sulfa Antibiotics Other (See Comments)    Pt states that this med makes her feel crazy.      Family History  Problem Relation Age of Onset  . Congestive Heart Failure Mother   . Heart attack Father   . CAD Neg Hx     Social History:  reports that she has never smoked. She has never used smokeless tobacco. She reports that she does not drink alcohol or use drugs.  ROS: A complete review of systems was performed.  All systems are negative except for pertinent findings as noted.  Physical Exam:  Vital signs in last 24 hours:   General:  Alert and oriented, No acute distress HEENT: Normocephalic, atraumatic Neck: No JVD or lymphadenopathy Cardiovascular: Regular rate and rhythm Lungs: Clear bilaterally Abdomen: Soft, nontender, nondistended, no abdominal masses Back: No CVA tenderness Extremities: No edema Neurologic: Grossly intact  Laboratory Data:  No results found for this or any previous visit (from the past 24 hour(s)). Recent Results (from the past 240 hour(s))  SARS CORONAVIRUS 2 (TAT 6-24 HRS) Nasopharyngeal Nasopharyngeal Swab     Status: None   Collection Time: 04/23/19  6:57 AM   Specimen: Nasopharyngeal Swab  Result Value Ref Range Status   SARS Coronavirus 2 NEGATIVE NEGATIVE Final    Comment: (NOTE) SARS-CoV-2 target nucleic acids are NOT DETECTED. The SARS-CoV-2 RNA is generally detectable in upper and  lower respiratory specimens during the acute phase of infection. Negative results do not preclude SARS-CoV-2 infection, do not rule out co-infections with other pathogens, and should not be used as the sole basis for treatment or other patient management decisions. Negative results must be combined with clinical observations, patient history, and epidemiological information. The expected result is Negative. Fact Sheet for Patients: HairSlick.no Fact Sheet for Healthcare  Providers: quierodirigir.com This test is not yet approved or cleared by the [place] FDA and  has been authorized for detection and/or diagnosis of SARS-CoV-2 by FDA under an Emergency Use Authorization (EUA). This EUA will remain  in effect (meaning this test can be used) for the duration of the COVID-19 declaration under Section 56 4(b)(1) of the Act, 21 U.S.C. section 360bbb-3(b)(1), unless the authorization is terminated or revoked sooner. Performed at [facility], [address] [place], [place] [address]    Creatinine: No results for input(s): CREATININE in the last 168 hours.  Radiologic Imaging: No results found.  Impression/Assessment:  Right renal calculus  Plan:  Repeat ESL  Bertram Millard Jema Deegan 04/26/2019, 8:47 PM  Bertram Millard. Christal Lagerstrom MD

## 2019-04-27 ENCOUNTER — Ambulatory Visit (HOSPITAL_COMMUNITY): Payer: PPO

## 2019-04-27 ENCOUNTER — Ambulatory Visit (HOSPITAL_COMMUNITY)
Admission: RE | Admit: 2019-04-27 | Discharge: 2019-04-27 | Disposition: A | Discharge status: Home / Self Care | Payer: PPO | Attending: Urology | Admitting: Urology

## 2019-04-27 ENCOUNTER — Encounter (HOSPITAL_COMMUNITY)
Admission: RE | Disposition: A | Discharge status: Home / Self Care | Payer: Self-pay | Source: Home / Self Care | Attending: Urology

## 2019-04-27 ENCOUNTER — Encounter (HOSPITAL_COMMUNITY): Payer: Self-pay | Admitting: Urology

## 2019-04-27 ENCOUNTER — Other Ambulatory Visit: Payer: Self-pay

## 2019-04-27 DIAGNOSIS — I5032 Chronic diastolic (congestive) heart failure: Secondary | ICD-10-CM | POA: Insufficient documentation

## 2019-04-27 DIAGNOSIS — Z882 Allergy status to sulfonamides status: Secondary | ICD-10-CM | POA: Diagnosis not present

## 2019-04-27 DIAGNOSIS — I2510 Atherosclerotic heart disease of native coronary artery without angina pectoris: Secondary | ICD-10-CM | POA: Insufficient documentation

## 2019-04-27 DIAGNOSIS — Z8249 Family history of ischemic heart disease and other diseases of the circulatory system: Secondary | ICD-10-CM | POA: Diagnosis not present

## 2019-04-27 DIAGNOSIS — Z87442 Personal history of urinary calculi: Secondary | ICD-10-CM | POA: Insufficient documentation

## 2019-04-27 DIAGNOSIS — I714 Abdominal aortic aneurysm, without rupture: Secondary | ICD-10-CM | POA: Diagnosis not present

## 2019-04-27 DIAGNOSIS — I110 Hypertensive heart disease with heart failure: Secondary | ICD-10-CM | POA: Diagnosis not present

## 2019-04-27 DIAGNOSIS — I480 Paroxysmal atrial fibrillation: Secondary | ICD-10-CM | POA: Diagnosis not present

## 2019-04-27 DIAGNOSIS — N200 Calculus of kidney: Secondary | ICD-10-CM | POA: Diagnosis not present

## 2019-04-27 DIAGNOSIS — Z79899 Other long term (current) drug therapy: Secondary | ICD-10-CM | POA: Insufficient documentation

## 2019-04-27 DIAGNOSIS — E782 Mixed hyperlipidemia: Secondary | ICD-10-CM | POA: Diagnosis not present

## 2019-04-27 DIAGNOSIS — I10 Essential (primary) hypertension: Secondary | ICD-10-CM | POA: Diagnosis not present

## 2019-04-27 DIAGNOSIS — D509 Iron deficiency anemia, unspecified: Secondary | ICD-10-CM | POA: Diagnosis not present

## 2019-04-27 DIAGNOSIS — Z01818 Encounter for other preprocedural examination: Secondary | ICD-10-CM | POA: Diagnosis not present

## 2019-04-27 DIAGNOSIS — I482 Chronic atrial fibrillation, unspecified: Secondary | ICD-10-CM | POA: Diagnosis not present

## 2019-04-27 HISTORY — PX: EXTRACORPOREAL SHOCK WAVE LITHOTRIPSY: SHX1557

## 2019-04-27 SURGERY — LITHOTRIPSY, ESWL
Anesthesia: LOCAL | Laterality: Right

## 2019-04-27 MED ORDER — CIPROFLOXACIN HCL 500 MG PO TABS
250.0000 mg | ORAL_TABLET | ORAL | Status: AC
  Administered 2019-04-27: 250 mg via ORAL
  Filled 2019-04-27: qty 1

## 2019-04-27 MED ORDER — DIAZEPAM 5 MG PO TABS
10.0000 mg | ORAL_TABLET | ORAL | Status: AC
  Administered 2019-04-27: 10 mg via ORAL
  Filled 2019-04-27: qty 2

## 2019-04-27 MED ORDER — DIPHENHYDRAMINE HCL 25 MG PO CAPS
25.0000 mg | ORAL_CAPSULE | ORAL | Status: AC
  Administered 2019-04-27: 25 mg via ORAL
  Filled 2019-04-27: qty 1

## 2019-04-27 MED ORDER — SODIUM CHLORIDE 0.9 % IV SOLN: INTRAVENOUS | Status: DC

## 2019-04-27 SURGICAL SUPPLY — 5 items
COVER SURGICAL LIGHT HANDLE (MISCELLANEOUS) ×2 IMPLANT
COVER WAND RF STERILE (DRAPES) IMPLANT
KIT TURNOVER KIT A (KITS) IMPLANT
PENCIL SMOKE EVACUATOR (MISCELLANEOUS) IMPLANT
TOWEL OR 17X26 10 PK STRL BLUE (TOWEL DISPOSABLE) ×2 IMPLANT

## 2019-04-27 NOTE — Discharge Instructions (Signed)
Lithotripsy, Care After This sheet gives you information about how to care for yourself after your procedure. Your health care provider may also give you more specific instructions. If you have problems or questions, contact your health care provider. What can I expect after the procedure? After the procedure, it is common to have:  Some blood in your urine. This should only last for a few days.  Soreness in your back, sides, or upper abdomen for a few days.  Blotches or bruises on your back where the pressure wave entered the skin.  Pain, discomfort, or nausea when pieces (fragments) of the kidney stone move through the tube that carries urine from the kidney to the bladder (ureter). Stone fragments may pass soon after the procedure, but they may continue to pass for up to 4-8 weeks. ? If you have severe pain or nausea, contact your health care provider. This may be caused by a large stone that was not broken up, and this may mean that you need more treatment.  Some pain or discomfort during urination.  Some pain or discomfort in the lower abdomen or (in men) at the base of the penis. Follow these instructions at home: Medicines  Take over-the-counter and prescription medicines only as told by your health care provider.  If you were prescribed an antibiotic medicine, take it as told by your health care provider. Do not stop taking the antibiotic even if you start to feel better.  Do not drive for 24 hours if you were given a medicine to help you relax (sedative).  Do not drive or use heavy machinery while taking prescription pain medicine. Eating and drinking      Drink enough water and fluids to keep your urine clear or pale yellow. This helps any remaining pieces of the stone to pass. It can also help prevent new stones from forming.  Eat plenty of fresh fruits and vegetables.  Follow instructions from your health care provider about eating and drinking restrictions. You may be  instructed: ? To reduce how much salt (sodium) you eat or drink. Check ingredients and nutrition facts on packaged foods and beverages. ? To reduce how much meat you eat.  Eat the recommended amount of calcium for your age and gender. Ask your health care provider how much calcium you should have. General instructions  Get plenty of rest.  Most people can resume normal activities 1-2 days after the procedure. Ask your health care provider what activities are safe for you.  Your health care provider may direct you to lie in a certain position (postural drainage) and tap firmly (percuss) over your kidney area to help stone fragments pass. Follow instructions as told by your health care provider.  If directed, strain all urine through the strainer that was provided by your health care provider. ? Keep all fragments for your health care provider to see. Any stones that are found may be sent to a medical lab for examination. The stone may be as small as a grain of salt.  Keep all follow-up visits as told by your health care provider. This is important. Contact a health care provider if:  You have pain that is severe or does not get better with medicine.  You have nausea that is severe or does not go away.  You have blood in your urine longer than your health care provider told you to expect.  You have more blood in your urine.  You have pain during urination that does   not go away.  You urinate more frequently than usual and this does not go away.  You develop a rash or any other possible signs of an allergic reaction. Get help right away if:  You have severe pain in your back, sides, or upper abdomen.  You have severe pain while urinating.  Your urine is very dark red.  You have blood in your stool (feces).  You cannot pass any urine at all.  You feel a strong urge to urinate after emptying your bladder.  You have a fever or chills.  You develop shortness of breath,  difficulty breathing, or chest pain.  You have severe nausea that leads to persistent vomiting.  You faint. Summary  After this procedure, it is common to have some pain, discomfort, or nausea when pieces (fragments) of the kidney stone move through the tube that carries urine from the kidney to the bladder (ureter). If this pain or nausea is severe, however, you should contact your health care provider.  Most people can resume normal activities 1-2 days after the procedure. Ask your health care provider what activities are safe for you.  Drink enough water and fluids to keep your urine clear or pale yellow. This helps any remaining pieces of the stone to pass, and it can help prevent new stones from forming.  If directed, strain your urine and keep all fragments for your health care provider to see. Fragments or stones may be as small as a grain of salt.  Get help right away if you have severe pain in your back, sides, or upper abdomen or have severe pain while urinating. This information is not intended to replace advice given to you by your health care provider. Make sure you discuss any questions you have with your health care provider. Document Revised: 07/21/2018 Document Reviewed: 02/29/2016 Elsevier Patient Education  2020 Elsevier Inc. Moderate Conscious Sedation, Adult, Care After These instructions provide you with information about caring for yourself after your procedure. Your health care provider may also give you more specific instructions. Your treatment has been planned according to current medical practices, but problems sometimes occur. Call your health care provider if you have any problems or questions after your procedure. What can I expect after the procedure? After your procedure, it is common:  To feel sleepy for several hours.  To feel clumsy and have poor balance for several hours.  To have poor judgment for several hours.  To vomit if you eat too  soon. Follow these instructions at home: For at least 24 hours after the procedure:   Do not: ? Participate in activities where you could fall or become injured. ? Drive. ? Use heavy machinery. ? Drink alcohol. ? Take sleeping pills or medicines that cause drowsiness. ? Make important decisions or sign legal documents. ? Take care of children on your own.  Rest. Eating and drinking  Follow the diet recommended by your health care provider.  If you vomit: ? Drink water, juice, or soup when you can drink without vomiting. ? Make sure you have little or no nausea before eating solid foods. General instructions  Have a responsible adult stay with you until you are awake and alert.  Take over-the-counter and prescription medicines only as told by your health care provider.  If you smoke, do not smoke without supervision.  Keep all follow-up visits as told by your health care provider. This is important. Contact a health care provider if:  You keep feeling nauseous   or you keep vomiting.  You feel light-headed.  You develop a rash.  You have a fever. Get help right away if:  You have trouble breathing. This information is not intended to replace advice given to you by your health care provider. Make sure you discuss any questions you have with your health care provider. Document Revised: 03/22/2017 Document Reviewed: 07/30/2015 Elsevier Patient Education  2020 Elsevier Inc.    See [facility] discharge instructions in chart. 

## 2019-04-27 NOTE — Op Note (Signed)
See [facility] OP note scanned into chart. 

## 2019-04-30 ENCOUNTER — Encounter: Payer: Self-pay | Admitting: *Deleted

## 2019-05-04 ENCOUNTER — Other Ambulatory Visit: Payer: Self-pay | Admitting: Cardiovascular Disease

## 2019-05-14 ENCOUNTER — Ambulatory Visit (HOSPITAL_COMMUNITY)
Admission: RE | Admit: 2019-05-14 | Discharge: 2019-05-14 | Disposition: A | Discharge status: Home / Self Care | Payer: PPO | Source: Ambulatory Visit | Attending: Urology | Admitting: Urology

## 2019-05-14 ENCOUNTER — Other Ambulatory Visit: Payer: Self-pay

## 2019-05-14 ENCOUNTER — Other Ambulatory Visit (HOSPITAL_COMMUNITY): Payer: Self-pay | Admitting: Urology

## 2019-05-14 DIAGNOSIS — N200 Calculus of kidney: Secondary | ICD-10-CM

## 2019-05-18 ENCOUNTER — Ambulatory Visit (INDEPENDENT_AMBULATORY_CARE_PROVIDER_SITE_OTHER): Payer: PPO | Admitting: Nurse Practitioner

## 2019-05-19 ENCOUNTER — Ambulatory Visit (INDEPENDENT_AMBULATORY_CARE_PROVIDER_SITE_OTHER): Payer: PPO | Admitting: Urology

## 2019-05-19 ENCOUNTER — Encounter: Payer: Self-pay | Admitting: Urology

## 2019-05-19 ENCOUNTER — Other Ambulatory Visit: Payer: Self-pay

## 2019-05-19 VITALS — BP 155/78 | HR 84 | Temp 97.8°F | Ht 63.0 in | Wt 155.0 lb

## 2019-05-19 DIAGNOSIS — Z87442 Personal history of urinary calculi: Secondary | ICD-10-CM | POA: Diagnosis not present

## 2019-05-19 LAB — POCT URINALYSIS DIPSTICK
Bilirubin, UA: NEGATIVE
Blood, UA: NEGATIVE
Glucose, UA: NEGATIVE
Ketones, UA: NEGATIVE
Nitrite, UA: NEGATIVE
Protein, UA: POSITIVE — AB
Spec Grav, UA: 1.02 (ref 1.010–1.025)
Urobilinogen, UA: NEGATIVE E.U./dL — AB
pH, UA: 6 (ref 5.0–8.0)

## 2019-05-19 NOTE — Progress Notes (Signed)
H&P  Chief Complaint: Kidney Stones (Surgery)  History of Present Illness: [name] is a 86 y.o. year old female  1.26.2021: She returns today for follow-up today post repeat ESWL (on 1.4.2021) of right kidney to clear out stone fragments remaining after initial procedure. Repeat KUB 1.21.2021 shows stone fragment remains in her right kidney but is stable in size at 6 mm from its last measurement at time of her procedure. She did return today with what she believes to have been stone fragments but these appear to be blood clots.     (below copied from [facility] records):  [name] is a 86 year-old female established patient who is here for renal calculi after a surgical intervention.  The problem is on the right side. She had eswl for treatment of her renal calculi. Patient denies stent, ureteroscopy, and percutaneous lithotomy. This procedure was done 09/20/2016.   She underwent shockwave lithotripsy of a 9 mm right renal pelvic stone on 5.31.2018.   Followup on 6.5.2018 revealed continued presence of a 5 mm renal pelvic stone. KUB 7.3.2019--8 mm rt lower pole stone.   7.28.2020: Recent scan shows stable stone size but it has moved to pelvis of right kidney.   10.26.2020: ESL of rt renal pelvic stone--10x11 mm   11.17.2020: She returns for follow-up post ESWL of rt renal stone -- small stone fragment remaining in rt kidney. She reports that she tolerated her first procedure very well with no pain/discomfort following.   1.4.2021: Underwent repeat ESWL for rt renal stone fragments post initial ESWL on 10.26.2020   Past Medical History:  Diagnosis Date  . Anemia   . Ascending aortic aneurysm ([place])    a. 4.6cm by CT 07/2016.  Marland Kitchen Chronic diastolic CHF (congestive heart failure) ([place])   . Coronary artery calcification seen on CT scan 07/2016  . Dysrhythmia    a fib  . History of blood transfusion   . History of kidney stones   . Hypertension   . Hypomagnesemia   . PAF  (paroxysmal atrial fibrillation) ([place])    a. dx during adm 08/2016.  Marland Kitchen 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   Negative Jak2, BCR/ABL, normal epo level on 02/01/2014  . Sepsis ([place]) 08/2016  . Thrombocytopenia ([place])   . Trigeminal neuralgia     Past Surgical History:  Procedure Laterality Date  . BREAST CYST EXCISION  60 yrs ago  . COLONOSCOPY WITH ESOPHAGOGASTRODUODENOSCOPY (EGD) N/A 03/10/2013   Procedure: COLONOSCOPY WITH ESOPHAGOGASTRODUODENOSCOPY (EGD);  Surgeon: Rogene Houston, MD;  Location: [facility];  Service: Endoscopy;  Laterality: N/A;  730  . EXTRACORPOREAL SHOCK WAVE LITHOTRIPSY Right 09/20/2016   Procedure: RIGHT EXTRACORPOREAL SHOCK WAVE LITHOTRIPSY (ESWL);  Surgeon: Bjorn Loser, MD;  Location: [facility] ORS;  Service: Urology;  Laterality: Right;  . EXTRACORPOREAL SHOCK WAVE LITHOTRIPSY Right 02/16/2019   Procedure: EXTRACORPOREAL SHOCK WAVE LITHOTRIPSY (ESWL);  Surgeon: Franchot Gallo, MD;  Location: [facility] ORS;  Service: Urology;  Laterality: Right;  . EXTRACORPOREAL SHOCK WAVE LITHOTRIPSY Right 04/27/2019   Procedure: EXTRACORPOREAL SHOCK WAVE LITHOTRIPSY (ESWL);  Surgeon: Franchot Gallo, MD;  Location: [facility];  Service: Urology;  Laterality: Right;  . EYE SURGERY     cataract surgery bilat   . Gamma Knife     Trigeminal   . SLT LASER APPLICATION Left 0/10/3708   Procedure: SLT LASER APPLICATION;  Surgeon: Williams Che, MD;  Location: [facility] ORS;  Service: Ophthalmology;  Laterality: Left;    Home Medications:  (Not in a hospital  admission)   Allergies:  Allergies  Allergen Reactions  . Sulfa Antibiotics Other (See Comments)    Pt states that this med makes her feel crazy.      Family History  Problem Relation Age of Onset  . Congestive Heart Failure Mother   . Heart attack Father   . CAD Neg Hx     Social History:  reports that she has never smoked. She has never used smokeless tobacco. She reports that she does not drink alcohol or use  drugs.  ROS: A complete review of systems was performed.  All systems are negative except for pertinent findings as noted.  Physical Exam:  Vital signs in last 24 hours: BP: ()/()  Arterial Line BP: ()/()  General:  Alert and oriented, No acute distress HEENT: Normocephalic, atraumatic Neck: No JVD or lymphadenopathy Cardiovascular: Regular rate and rhythm Lungs: Clear bilaterally Abdomen: Soft, nontender, nondistended, no abdominal masses Back: No CVA tenderness Extremities: No edema Neurologic: Grossly intact  Laboratory Data:  No results found for this or any previous visit (from the past 24 hour(s)). No results found for this or any previous visit (from the past 240 hour(s)). Creatinine: No results for input(s): CREATININE in the last 168 hours.  Radiologic Imaging: No results found.  Impression/Assessment:  Although stone fragment was easily targeted during repeat ESWL, repeat KUB last week shows the stone fragment remained intact at 6 mm and has not passed. It has, however, seemingly moved to the lower pole of her rt kidney whereas it was previously in the renal pelvis. Where it is currently, it does not seem at risk for passing but we will continue to monitor this.    Plan:  1. Return in 6 mo for OV to check in with her stone sx's  2. Repeat KUB in 6 mo's -- will discuss results at this next OV.  3. She will call to return sooner should she become symptomatic for this remaining stone fragment.   [name] 05/19/2019, 8:28 AM  Bertram Millard. Ephriam Turman MD

## 2019-05-26 ENCOUNTER — Encounter: Payer: Self-pay | Admitting: Urology

## 2019-07-06 DIAGNOSIS — R7303 Prediabetes: Secondary | ICD-10-CM | POA: Diagnosis not present

## 2019-07-06 DIAGNOSIS — I482 Chronic atrial fibrillation, unspecified: Secondary | ICD-10-CM | POA: Diagnosis not present

## 2019-07-06 DIAGNOSIS — R944 Abnormal results of kidney function studies: Secondary | ICD-10-CM | POA: Diagnosis not present

## 2019-07-06 DIAGNOSIS — D509 Iron deficiency anemia, unspecified: Secondary | ICD-10-CM | POA: Diagnosis not present

## 2019-07-06 DIAGNOSIS — G500 Trigeminal neuralgia: Secondary | ICD-10-CM | POA: Diagnosis not present

## 2019-07-06 DIAGNOSIS — R7301 Impaired fasting glucose: Secondary | ICD-10-CM | POA: Diagnosis not present

## 2019-07-06 DIAGNOSIS — Z0000 Encounter for general adult medical examination without abnormal findings: Secondary | ICD-10-CM | POA: Diagnosis not present

## 2019-07-06 DIAGNOSIS — I10 Essential (primary) hypertension: Secondary | ICD-10-CM | POA: Diagnosis not present

## 2019-07-06 DIAGNOSIS — K219 Gastro-esophageal reflux disease without esophagitis: Secondary | ICD-10-CM | POA: Diagnosis not present

## 2019-07-06 DIAGNOSIS — N1831 Chronic kidney disease, stage 3a: Secondary | ICD-10-CM | POA: Diagnosis not present

## 2019-07-06 DIAGNOSIS — N209 Urinary calculus, unspecified: Secondary | ICD-10-CM | POA: Diagnosis not present

## 2019-07-06 DIAGNOSIS — E782 Mixed hyperlipidemia: Secondary | ICD-10-CM | POA: Diagnosis not present

## 2019-07-13 DIAGNOSIS — R05 Cough: Secondary | ICD-10-CM | POA: Diagnosis not present

## 2019-07-13 DIAGNOSIS — I739 Peripheral vascular disease, unspecified: Secondary | ICD-10-CM | POA: Diagnosis not present

## 2019-07-13 DIAGNOSIS — M79675 Pain in left toe(s): Secondary | ICD-10-CM | POA: Diagnosis not present

## 2019-07-13 DIAGNOSIS — K219 Gastro-esophageal reflux disease without esophagitis: Secondary | ICD-10-CM | POA: Diagnosis not present

## 2019-07-13 DIAGNOSIS — I482 Chronic atrial fibrillation, unspecified: Secondary | ICD-10-CM | POA: Diagnosis not present

## 2019-07-13 DIAGNOSIS — B351 Tinea unguium: Secondary | ICD-10-CM | POA: Diagnosis not present

## 2019-07-13 DIAGNOSIS — E782 Mixed hyperlipidemia: Secondary | ICD-10-CM | POA: Diagnosis not present

## 2019-07-13 DIAGNOSIS — R7301 Impaired fasting glucose: Secondary | ICD-10-CM | POA: Diagnosis not present

## 2019-07-13 DIAGNOSIS — E663 Overweight: Secondary | ICD-10-CM | POA: Diagnosis not present

## 2019-07-13 DIAGNOSIS — D509 Iron deficiency anemia, unspecified: Secondary | ICD-10-CM | POA: Diagnosis not present

## 2019-07-13 DIAGNOSIS — Z87442 Personal history of urinary calculi: Secondary | ICD-10-CM | POA: Diagnosis not present

## 2019-07-13 DIAGNOSIS — Z0001 Encounter for general adult medical examination with abnormal findings: Secondary | ICD-10-CM | POA: Diagnosis not present

## 2019-07-13 DIAGNOSIS — Z6826 Body mass index (BMI) 26.0-26.9, adult: Secondary | ICD-10-CM | POA: Diagnosis not present

## 2019-07-13 DIAGNOSIS — I10 Essential (primary) hypertension: Secondary | ICD-10-CM | POA: Diagnosis not present

## 2019-07-13 DIAGNOSIS — N1831 Chronic kidney disease, stage 3a: Secondary | ICD-10-CM | POA: Diagnosis not present

## 2019-07-31 ENCOUNTER — Other Ambulatory Visit: Payer: Self-pay | Admitting: Thoracic Surgery (Cardiothoracic Vascular Surgery)

## 2019-07-31 DIAGNOSIS — I712 Thoracic aortic aneurysm, without rupture, unspecified: Secondary | ICD-10-CM

## 2019-07-31 DIAGNOSIS — I482 Chronic atrial fibrillation, unspecified: Secondary | ICD-10-CM | POA: Diagnosis not present

## 2019-07-31 DIAGNOSIS — Z0001 Encounter for general adult medical examination with abnormal findings: Secondary | ICD-10-CM | POA: Diagnosis not present

## 2019-07-31 DIAGNOSIS — E782 Mixed hyperlipidemia: Secondary | ICD-10-CM | POA: Diagnosis not present

## 2019-07-31 DIAGNOSIS — I10 Essential (primary) hypertension: Secondary | ICD-10-CM | POA: Diagnosis not present

## 2019-07-31 DIAGNOSIS — D509 Iron deficiency anemia, unspecified: Secondary | ICD-10-CM | POA: Diagnosis not present

## 2019-08-03 ENCOUNTER — Other Ambulatory Visit: Payer: Self-pay | Admitting: Cardiovascular Disease

## 2019-08-25 DIAGNOSIS — I482 Chronic atrial fibrillation, unspecified: Secondary | ICD-10-CM | POA: Diagnosis not present

## 2019-08-25 DIAGNOSIS — D509 Iron deficiency anemia, unspecified: Secondary | ICD-10-CM | POA: Diagnosis not present

## 2019-08-25 DIAGNOSIS — I10 Essential (primary) hypertension: Secondary | ICD-10-CM | POA: Diagnosis not present

## 2019-08-25 DIAGNOSIS — E782 Mixed hyperlipidemia: Secondary | ICD-10-CM | POA: Diagnosis not present

## 2019-08-25 DIAGNOSIS — Z0001 Encounter for general adult medical examination with abnormal findings: Secondary | ICD-10-CM | POA: Diagnosis not present

## 2019-09-10 ENCOUNTER — Ambulatory Visit
Admission: RE | Admit: 2019-09-10 | Discharge: 2019-09-10 | Disposition: A | Discharge status: Home / Self Care | Payer: PPO | Source: Ambulatory Visit | Attending: Thoracic Surgery (Cardiothoracic Vascular Surgery) | Admitting: Thoracic Surgery (Cardiothoracic Vascular Surgery)

## 2019-09-10 DIAGNOSIS — I712 Thoracic aortic aneurysm, without rupture, unspecified: Secondary | ICD-10-CM

## 2019-09-10 MED ORDER — IOPAMIDOL (ISOVUE-370) INJECTION 76%
75.0000 mL | Freq: Once | INTRAVENOUS | Status: AC | PRN
  Administered 2019-09-10: 75 mL via INTRAVENOUS

## 2019-09-15 ENCOUNTER — Ambulatory Visit: Payer: PPO | Admitting: Thoracic Surgery (Cardiothoracic Vascular Surgery)

## 2019-09-15 ENCOUNTER — Encounter: Payer: Self-pay | Admitting: Thoracic Surgery (Cardiothoracic Vascular Surgery)

## 2019-09-15 ENCOUNTER — Other Ambulatory Visit: Payer: Self-pay

## 2019-09-15 VITALS — BP 151/91 | HR 79 | Temp 97.5°F | Resp 18 | Ht 63.0 in | Wt 153.6 lb

## 2019-09-15 DIAGNOSIS — I7121 Aneurysm of the ascending aorta, without rupture: Secondary | ICD-10-CM

## 2019-09-15 DIAGNOSIS — I712 Thoracic aortic aneurysm, without rupture, unspecified: Secondary | ICD-10-CM

## 2019-09-15 NOTE — Progress Notes (Signed)
[address]       [name] [address]             [phone]      HPI: [name] returns for a scheduled follow-up visit  [name] is an 86 year old woman with a history of coronary disease, chronic diastolic congestive heart failure, ascending aortic aneurysm, polycythemia, hypertension, paroxysmal atrial fibrillation, trigeminal neuralgia, thrombocytopenia, and nephrolithiasis.   She was first found to have an ascending aortic aneurysm in 2018.  It was 4.6 cm at that time.  A year ago in May 2020 it measured 5 cm.  I last saw her in November 2020.  She was having issues with nephrolithiasis.  Her aneurysm measured 5.2 cm at that time.  She still having issues with nephrolithiasis.  She sees Dr. Diona Fanti next week.  She is not having any chest pain, pressure, or tightness.  Past Medical History:  Diagnosis Date  . Anemia   . Ascending aortic aneurysm ([place])    a. 4.6cm by CT 07/2016.  Marland Kitchen Chronic diastolic CHF (congestive heart failure) ([place])   . Coronary artery calcification seen on CT scan 07/2016  . Dysrhythmia    a fib  . History of blood transfusion   . History of kidney stones   . Hypertension   . Hypomagnesemia   . PAF (paroxysmal atrial fibrillation) ([place])    a. dx during adm 08/2016.  Marland Kitchen 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   Negative Jak2, BCR/ABL, normal epo level on 02/01/2014  . Sepsis ([place]) 08/2016  . Thrombocytopenia ([place])   . Trigeminal neuralgia     Current Outpatient Medications  Medication Sig Dispense Refill  . Calcium Carbonate-Vitamin D (CALCIUM 600+D) 600-400 MG-UNIT tablet Take 1 tablet by mouth daily.    . Cinnamon 500 MG capsule Take 1,000 mg by mouth daily.    . Coenzyme Q10 (COQ10) 100 MG CAPS Take 200 mg by mouth daily.     Marland Kitchen diltiazem (CARDIZEM CD) 120 MG 24 hr capsule Take 1 capsule (120 mg total) by mouth daily. 30 capsule 11  . ELIQUIS 5 MG TABS tablet TAKE (1) TABLET BY MOUTH TWICE DAILY. 180 tablet 0  .  esomeprazole (NEXIUM) 40 MG capsule Take 1 capsule (40 mg total) by mouth every other day.    . ferrous sulfate 325 (65 FE) MG tablet Take 325 mg by mouth daily.     . furosemide (LASIX) 20 MG tablet Take 1 tablet (20 mg total) by mouth daily as needed. 30 tablet 11  . gabapentin (NEURONTIN) 300 MG capsule Take 300 mg by mouth at bedtime.     . metoprolol succinate (TOPROL-XL) 25 MG 24 hr tablet Take 1 tablet (25 mg total) by mouth daily. 90 tablet 3  . Multiple Vitamin (MULTIVITAMIN WITH MINERALS) TABS tablet Take 1 tablet by mouth daily.    . NON FORMULARY Take 250 mg by mouth daily. Keratin    . pravastatin (PRAVACHOL) 40 MG tablet Take 40 mg by mouth at bedtime.     Marland Kitchen Specialty Vitamins Products (BIOTIN PLUS KERATIN PO) Take 250 mg by mouth.    . timolol (TIMOPTIC) 0.5 % ophthalmic solution Place 1 drop into both eyes daily.     No current facility-administered medications for this visit.    Physical Exam BP (!) 151/91 (BP Location: Left Arm, Patient Position: Sitting, Cuff Size: Normal)   Pulse 79   Temp (!) 97.5 F (36.4 C)   Resp 18  Ht 5\' 3"  (1.6 m)   Wt 153 lb 9.6 oz (69.7 kg)   SpO2 95% Comment: RA  BMI 27.64 kg/m  86 year old woman in no acute distress Alert and oriented x3 with no focal neurologic deficits No carotid bruits Kyphoscoliosis Cardiac regular rate and rhythm with normal S1 and S2, no murmur Lungs clear with equal breath sounds bilaterally, no rales or wheezing No peripheral edema  Diagnostic Tests: CT ANGIOGRAPHY CHEST WITH CONTRAST  TECHNIQUE: Multidetector CT imaging of the chest was performed using the standard protocol during bolus administration of intravenous contrast. Multiplanar CT image reconstructions and MIPs were obtained to evaluate the vascular anatomy.  CONTRAST:  60mL ISOVUE-370 IOPAMIDOL (ISOVUE-370) INJECTION 76%  COMPARISON:  03/04/2019  FINDINGS: Cardiovascular: The heart is within normal limits in size and stable. There  is fusiform aneurysmal dilatation of the ascending thoracic aorta with maximum measurement of 5 cm at the level of the left pulmonary artery. No dissection. Remarkably little atherosclerotic calcifications for the patient's age. The branch vessels are patent. Remarkably little if any coronary artery calcifications for age.  The pulmonary arteries appear normal.  Mediastinum/Nodes: No mediastinal or hilar mass or adenopathy. Stable large hiatal hernia.  Lungs/Pleura: No acute pulmonary findings. No worrisome pulmonary lesions. Mild eventration of both hemidiaphragms with some overlying vascular crowding and atelectasis. No pleural effusions or pleural lesions.  Upper Abdomen: No significant upper abdominal findings. Stable aortic and branch vessel calcifications.  Musculoskeletal: No breast masses, supraclavicular or axillary adenopathy. The bony thorax is intact. Stable exaggerated thoracic kyphosis.  Review of the MIP images confirms the above findings.  IMPRESSION: 1. Stable fusiform aneurysmal dilatation of the ascending thoracic aorta with maximum measurement of 5 cm. No dissection. 2. Remarkably little if any coronary artery calcifications for age. 3. No acute pulmonary findings or worrisome pulmonary lesions. 4. Stable large hiatal hernia. 5. Aortic atherosclerosis.  Aortic Atherosclerosis (ICD10-I70.0).  Aortic aneurysm NOS (ICD10-I71.9).   Electronically Signed   By: 13/02/2019 M.D.   On: 09/10/2019 12:50 I personally reviewed the CT images and concur with the findings noted above  Impression: [name] is an 86 year old woman with a history of coronary disease, chronic diastolic congestive heart failure, ascending aortic aneurysm, polycythemia, hypertension, paroxysmal atrial fibrillation, trigeminal neuralgia, thrombocytopenia, and nephrolithiasis.  Overall she is doing extremely well considering her age.  Ascending aortic aneurysm-stable at  around 5 cm.  We will plan to continue with semiannual follow-up.  Importance of blood pressure control was emphasized.  Probably not a surgical candidate, but wishes to continue to follow.  Hypertension-blood pressure mildly elevated today.  She is compliant with medications.  Blood pressure is lower than it was at her last visit.  She says she gets very anxious about these visits and the results of the CT scan.  Follow-up with primary  Chronic diastolic heart failure-appears well compensated at present.  Plan: Return in 6 months with CT angiogram of chest  92, MD [facility] [phone]

## 2019-09-28 DIAGNOSIS — M79675 Pain in left toe(s): Secondary | ICD-10-CM | POA: Diagnosis not present

## 2019-09-28 DIAGNOSIS — B351 Tinea unguium: Secondary | ICD-10-CM | POA: Diagnosis not present

## 2019-09-28 DIAGNOSIS — L851 Acquired keratosis [keratoderma] palmaris et plantaris: Secondary | ICD-10-CM | POA: Diagnosis not present

## 2019-09-28 DIAGNOSIS — I739 Peripheral vascular disease, unspecified: Secondary | ICD-10-CM | POA: Diagnosis not present

## 2019-09-29 DIAGNOSIS — D509 Iron deficiency anemia, unspecified: Secondary | ICD-10-CM | POA: Diagnosis not present

## 2019-09-29 DIAGNOSIS — Z0001 Encounter for general adult medical examination with abnormal findings: Secondary | ICD-10-CM | POA: Diagnosis not present

## 2019-09-29 DIAGNOSIS — I482 Chronic atrial fibrillation, unspecified: Secondary | ICD-10-CM | POA: Diagnosis not present

## 2019-09-29 DIAGNOSIS — E782 Mixed hyperlipidemia: Secondary | ICD-10-CM | POA: Diagnosis not present

## 2019-09-29 DIAGNOSIS — I10 Essential (primary) hypertension: Secondary | ICD-10-CM | POA: Diagnosis not present

## 2019-10-15 ENCOUNTER — Telehealth: Payer: PPO | Admitting: Cardiovascular Disease

## 2019-10-19 NOTE — Progress Notes (Signed)
Virtual Visit via Telephone Note   This visit type was conducted due to national recommendations for restrictions regarding the COVID-19 Pandemic (e.g. social distancing) in an effort to limit this patient's exposure and mitigate transmission in our community.  Due to her co-morbid illnesses, this patient is at least at moderate risk for complications without adequate follow up.  This format is felt to be most appropriate for this patient at this time.  The patient did not have access to video technology/had technical difficulties with video requiring transitioning to audio format only (telephone).  All issues noted in this document were discussed and addressed.  No physical exam could be performed with this format.  Please refer to the patient's chart for her  consent to telehealth for [facility].   The patient was identified using 2 identifiers.  Date:  10/20/2019   ID:  [name], DOB [birth year], MRN [id number]  Patient Location: Home Provider Location: Office  PCP:  Benita Stabile, MD  Cardiologist:  Prentice Docker, MD Electrophysiologist:  None   Evaluation Performed:  Follow-Up Visit  Chief Complaint: 59-month visit  History of Present Illness:    [name] is a 87 y.o. female with past medical history of paroxysmal atrial fibrillation (on Eliquis), coronary calcifications by prior CT, chronic diastolic CHF, thoracic aortic aneurysm, nephrolithiasis and HTN who presents for a 23-month follow-up telehealth visit.   She most recently had a phone visit with Dr. Purvis Sheffield in 03/2019 and denied any recent chest pain or palpitations at that time. She was taking Lasix 20mg  every other day for edema and was continued on Cardizem CD 120mg  daily and Toprol-XL 25mg  daily for rate-control along with Eliquis for anticoagulation.   She did follow-up with Dr. in 08/2019 for her thoracic aortic aneurysm. A repeat CTA was obtained and showed a stable fusiform aneurysmal  dilatation of the ascending thoracic aorta with maximum measurement of 5 cm. The report also mentioned remarkably little if any coronary artery calcifications for her age.   In talking with the patient today, she denies any recent chest pain or palpitations. She stays active for her age and walks to the mailbox with her cane on a daily basis for exercise. Denies any specific dyspnea on exertion, orthopnea, PND or edema.   She reports always feeling cold due to being on a blood thinner but denies any evidence of active bleeding. Had labs with her PCP in 06/2019.  The patient does not have symptoms concerning for COVID-19 infection (fever, chills, cough, or new shortness of breath).    Past Medical History:  Diagnosis Date  . Anemia   . Ascending aortic aneurysm (HCC)    a. 4.6cm by CT 07/2016.  09/2019 Chronic diastolic CHF (congestive heart failure) (HCC)   . Coronary artery calcification seen on CT scan 07/2016  . Dysrhythmia    a fib  . History of blood transfusion   . History of kidney stones   . Hypertension   . Hypomagnesemia   . PAF (paroxysmal atrial fibrillation) (HCC)    a. dx during adm 08/2016.  Marland Kitchen 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   Negative Jak2, BCR/ABL, normal epo level on 02/01/2014  . Sepsis (HCC) 08/2016  . Thrombocytopenia (HCC)   . Trigeminal neuralgia    Past Surgical History:  Procedure Laterality Date  . BREAST CYST EXCISION  60 yrs ago  . COLONOSCOPY WITH ESOPHAGOGASTRODUODENOSCOPY (EGD) N/A 03/10/2013   Procedure: COLONOSCOPY WITH ESOPHAGOGASTRODUODENOSCOPY (EGD);  Surgeon: 04/03/2014,  MD;  [facility];  Service: Endoscopy;  Laterality: N/A;  730  . EXTRACORPOREAL SHOCK WAVE LITHOTRIPSY Right 09/20/2016   Procedure: RIGHT EXTRACORPOREAL SHOCK WAVE LITHOTRIPSY (ESWL);  Surgeon: Alfredo Martinez, MD;  Location: [facility];  Service: Urology;  Laterality: Right;  . EXTRACORPOREAL SHOCK WAVE LITHOTRIPSY Right 02/16/2019   Procedure: EXTRACORPOREAL SHOCK  WAVE LITHOTRIPSY (ESWL);  Surgeon: Marcine Matar, MD;  Location: [facility];  Service: Urology;  Laterality: Right;  . EXTRACORPOREAL SHOCK WAVE LITHOTRIPSY Right 04/27/2019   Procedure: EXTRACORPOREAL SHOCK WAVE LITHOTRIPSY (ESWL);  Surgeon: Marcine Matar, MD;  Location: [facility];  Service: Urology;  Laterality: Right;  . EYE SURGERY     cataract surgery bilat   . Gamma Knife     Trigeminal   . SLT LASER APPLICATION Left 09/27/2014   Procedure: SLT LASER APPLICATION;  Surgeon: Susa Simmonds, MD;  Location: [facility];  Service: Ophthalmology;  Laterality: Left;     Current Meds  Medication Sig  . Calcium Carbonate-Vitamin D (CALCIUM 600+D) 600-400 MG-UNIT tablet Take 1 tablet by mouth daily.  . Cinnamon 500 MG capsule Take 1,000 mg by mouth daily.  . Coenzyme Q10 (COQ10) 100 MG CAPS Take 200 mg by mouth daily.   Marland Kitchen diltiazem (CARDIZEM CD) 120 MG 24 hr capsule Take 1 capsule (120 mg total) by mouth daily.  Marland Kitchen ELIQUIS 5 MG TABS tablet TAKE (1) TABLET BY MOUTH TWICE DAILY.  Marland Kitchen esomeprazole (NEXIUM) 40 MG capsule Take 1 capsule (40 mg total) by mouth every other day.  . ferrous sulfate 325 (65 FE) MG tablet Take 325 mg by mouth daily.   . furosemide (LASIX) 20 MG tablet Take 1 tablet (20 mg total) by mouth daily as needed.  . gabapentin (NEURONTIN) 300 MG capsule Take 300 mg by mouth at bedtime.   . metoprolol succinate (TOPROL-XL) 25 MG 24 hr tablet Take 1 tablet (25 mg total) by mouth daily.  . Multiple Vitamin (MULTIVITAMIN WITH MINERALS) TABS tablet Take 1 tablet by mouth daily.  . NON FORMULARY Take 250 mg by mouth daily. Keratin  . pravastatin (PRAVACHOL) 40 MG tablet Take 40 mg by mouth at bedtime.   Marland Kitchen Specialty Vitamins Products (BIOTIN PLUS KERATIN PO) Take 250 mg by mouth.  . timolol (TIMOPTIC) 0.5 % ophthalmic solution Place 1 drop into both eyes daily.  . [DISCONTINUED] diltiazem (CARDIZEM CD) 120 MG 24 hr capsule Take 1 capsule (120 mg total) by mouth daily.  . [DISCONTINUED]  furosemide (LASIX) 20 MG tablet Take 1 tablet (20 mg total) by mouth daily as needed.  . [DISCONTINUED] metoprolol succinate (TOPROL-XL) 25 MG 24 hr tablet Take 1 tablet (25 mg total) by mouth daily.     Allergies:   Sulfa antibiotics   Social History   Tobacco Use  . Smoking status: Never Smoker  . Smokeless tobacco: Never Used  Vaping Use  . Vaping Use: Never used  Substance Use Topics  . Alcohol use: No  . Drug use: No     Family Hx: The patient's family history includes Congestive Heart Failure in her mother; Heart attack in her father. There is no history of CAD.  ROS:   Please see the history of present illness.     All other systems reviewed and are negative.   Prior CV studies:   The following studies were reviewed today:  Echocardiogram: 11/2018 IMPRESSIONS    1. The left ventricle has hyperdynamic systolic function, with an  ejection fraction of >65%. The cavity  size was normal. Left ventricular  diastolic parameters were normal. No evidence of left ventricular regional  wall motion abnormalities.  2. The right ventricle has normal systolic function. The cavity was  normal. There is no increase in right ventricular wall thickness. Right  ventricular systolic pressure is mildly elevated with an estimated  pressure of 34.7 mmHg.  3. Left atrial size was mildly dilated.  4. The aortic valve is tricuspid. Aortic valve regurgitation is mild by  color flow Doppler. Mild to moderate aortic annular calcification noted.  5. The mitral valve is grossly normal. There is mild mitral annular  calcification present. There is mild mitral regurgitation.  6. The tricuspid valve is grossly normal. There is mild tricuspid  regurgitation.  7. The aorta is normal unless otherwise noted.     CTA: 08/2019 IMPRESSION: 1. Stable fusiform aneurysmal dilatation of the ascending thoracic aorta with maximum measurement of 5 cm. No dissection. 2. Remarkably little if any  coronary artery calcifications for age. 3. No acute pulmonary findings or worrisome pulmonary lesions. 4. Stable large hiatal hernia. 5. Aortic atherosclerosis.   Labs/Other Tests and Data Reviewed:    EKG:  No ECG reviewed.  Recent Labs: No results found for requested labs within last 8760 hours.   Recent Lipid Panel No results found for: CHOL, TRIG, HDL, CHOLHDL, LDLCALC, LDLDIRECT  Wt Readings from Last 3 Encounters:  10/20/19 150 lb (68 kg)  09/15/19 153 lb 9.6 oz (69.7 kg)  05/19/19 155 lb (70.3 kg)     Objective:    Vital Signs:  BP 130/80   Pulse 80   Ht 5\' 3"  (1.6 m)   Wt 150 lb (68 kg)   BMI 26.57 kg/m    General: Pleasant female sounding in NAD Psych: Normal affect. Neuro: Alert and oriented X 3.  Lungs:  Resp regular and unlabored while talking on the phone.   ASSESSMENT & PLAN:    1. Paroxysmal Atrial Fibrillation - She denies any recent palpitations and heart rate has been well controlled in the 70's to 80's on most recent check. Continue current rate-control regimen with Cardizem CD 120 mg daily and Toprol-XL 25 mg daily. - She denies any evidence of active bleeding remains on Eliquis 5 mg twice daily. She reports having routine labs with her PCP in 06/2019. Will request a record of this to make sure a CBC and BMET were obtained.  2. Coronary Calcifications by Prior CT - She did have coronary calcifications by prior CT but most recent imaging in 08/2019 mentioned that she had remarkably little if any coronary artery calcifications given her age.  She denies any recent anginal symptoms. Continue with risk factor modification. - She remains on beta-blocker and statin therapy. She is no longer on ASA given the need for anticoagulation.  3. Chronic Diastolic CHF - She does experience intermittent lower extremity edema but denies any recent orthopnea or PND. She takes Lasix 20 mg daily except on the days she has a doctor's appointment or places to go due to  frequent urination. Will request a copy of her most recent BMET as outlined above.   4. HTN - BP was well controlled at 130/80 on most recent check. Continue current medication regimen with Cardizem CD 120 mg daily and Toprol-XL 25 mg daily.  5. Thoracic Aortic Aneurysm - Followed by CT Surgery and most recent imaging in 08/2019 showed a stable fusiform aneurysmal dilatation of the ascending thoracic aorta with maximum measurement of 5 cm.  COVID-19 Education: The signs and symptoms of COVID-19 were discussed with the patient and how to seek care for testing (follow up with PCP or arrange E-visit). The importance of social distancing was discussed today.  Time:   Today, I have spent 19 minutes with the patient with telehealth technology discussing the above problems.     Medication Adjustments/Labs and Tests Ordered: Current medicines are reviewed at length with the patient today.  Concerns regarding medicines are outlined above.   Tests Ordered: No orders of the defined types were placed in this encounter.   Medication Changes: Meds ordered this encounter  Medications  . metoprolol succinate (TOPROL-XL) 25 MG 24 hr tablet    Sig: Take 1 tablet (25 mg total) by mouth daily.    Dispense:  90 tablet    Refill:  3    Order Specific Question:   Supervising Provider    Answer:   Lars Masson [id number]  . diltiazem (CARDIZEM CD) 120 MG 24 hr capsule    Sig: Take 1 capsule (120 mg total) by mouth daily.    Dispense:  90 capsule    Refill:  3    Order Specific Question:   Supervising Provider    Answer:   Lars Masson [id number]  . furosemide (LASIX) 20 MG tablet    Sig: Take 1 tablet (20 mg total) by mouth daily as needed.    Dispense:  90 tablet    Refill:  3    Order Specific Question:   Supervising Provider    Answer:   Lars Masson [[id number]]    Follow Up:  In Person in 6 month(s)  Signed, Ellsworth Lennox, Cordelia Poche  10/20/2019 5:44 PM    [facility]

## 2019-10-20 ENCOUNTER — Encounter: Payer: Self-pay | Admitting: Student

## 2019-10-20 ENCOUNTER — Other Ambulatory Visit: Payer: Self-pay

## 2019-10-20 ENCOUNTER — Encounter: Payer: Self-pay | Admitting: *Deleted

## 2019-10-20 ENCOUNTER — Telehealth (INDEPENDENT_AMBULATORY_CARE_PROVIDER_SITE_OTHER): Payer: PPO | Admitting: Student

## 2019-10-20 VITALS — BP 130/80 | HR 80 | Ht 63.0 in | Wt 150.0 lb

## 2019-10-20 DIAGNOSIS — I5032 Chronic diastolic (congestive) heart failure: Secondary | ICD-10-CM | POA: Diagnosis not present

## 2019-10-20 DIAGNOSIS — I712 Thoracic aortic aneurysm, without rupture: Secondary | ICD-10-CM

## 2019-10-20 DIAGNOSIS — I480 Paroxysmal atrial fibrillation: Secondary | ICD-10-CM

## 2019-10-20 DIAGNOSIS — I10 Essential (primary) hypertension: Secondary | ICD-10-CM

## 2019-10-20 DIAGNOSIS — I7121 Aneurysm of the ascending aorta, without rupture: Secondary | ICD-10-CM

## 2019-10-20 DIAGNOSIS — I2510 Atherosclerotic heart disease of native coronary artery without angina pectoris: Secondary | ICD-10-CM | POA: Diagnosis not present

## 2019-10-20 MED ORDER — FUROSEMIDE 20 MG PO TABS: 20.0000 mg | ORAL_TABLET | Freq: Every day | ORAL | 3 refills | Status: DC | PRN

## 2019-10-20 MED ORDER — METOPROLOL SUCCINATE ER 25 MG PO TB24: 25.0000 mg | ORAL_TABLET | Freq: Every day | ORAL | 3 refills | Status: DC

## 2019-10-20 MED ORDER — DILTIAZEM HCL ER COATED BEADS 120 MG PO CP24: 120.0000 mg | ORAL_CAPSULE | Freq: Every day | ORAL | 3 refills | Status: DC

## 2019-10-20 NOTE — Patient Instructions (Signed)

## 2019-11-09 DIAGNOSIS — E782 Mixed hyperlipidemia: Secondary | ICD-10-CM | POA: Diagnosis not present

## 2019-11-09 DIAGNOSIS — I482 Chronic atrial fibrillation, unspecified: Secondary | ICD-10-CM | POA: Diagnosis not present

## 2019-11-09 DIAGNOSIS — I10 Essential (primary) hypertension: Secondary | ICD-10-CM | POA: Diagnosis not present

## 2019-11-09 DIAGNOSIS — D509 Iron deficiency anemia, unspecified: Secondary | ICD-10-CM | POA: Diagnosis not present

## 2019-11-17 ENCOUNTER — Ambulatory Visit (HOSPITAL_COMMUNITY)
Admission: RE | Admit: 2019-11-17 | Discharge: 2019-11-17 | Disposition: A | Discharge status: Home / Self Care | Payer: PPO | Source: Ambulatory Visit | Attending: Urology | Admitting: Urology

## 2019-11-17 ENCOUNTER — Ambulatory Visit (INDEPENDENT_AMBULATORY_CARE_PROVIDER_SITE_OTHER): Payer: PPO | Admitting: Urology

## 2019-11-17 ENCOUNTER — Ambulatory Visit: Payer: PPO | Admitting: Urology

## 2019-11-17 ENCOUNTER — Other Ambulatory Visit: Payer: Self-pay

## 2019-11-17 VITALS — BP 136/85 | HR 84 | Temp 98.5°F | Ht 63.0 in | Wt 146.0 lb

## 2019-11-17 DIAGNOSIS — Z87442 Personal history of urinary calculi: Secondary | ICD-10-CM | POA: Insufficient documentation

## 2019-11-17 DIAGNOSIS — N200 Calculus of kidney: Secondary | ICD-10-CM | POA: Diagnosis not present

## 2019-11-17 LAB — URINALYSIS, ROUTINE W REFLEX MICROSCOPIC
Bilirubin, UA: NEGATIVE
Glucose, UA: NEGATIVE
Nitrite, UA: POSITIVE — AB
Specific Gravity, UA: 1.025 (ref 1.005–1.030)
Urobilinogen, Ur: 0.2 mg/dL (ref 0.2–1.0)
pH, UA: 5 (ref 5.0–7.5)

## 2019-11-17 LAB — MICROSCOPIC EXAMINATION: WBC, UA: >30 /hpf — AB (ref 0–5)

## 2019-11-17 NOTE — Progress Notes (Signed)
Urological Symptom Review  Patient is experiencing the following symptoms: Kidney stones   Review of Systems  Gastrointestinal (upper)  : Negative for upper GI symptoms  Gastrointestinal (lower) : Negative for lower GI symptoms  Constitutional : Negative for symptoms  Skin: Negative for skin symptoms  Eyes: Negative for eye symptoms  Ear/Nose/Throat : Negative for Ear/Nose/Throat symptoms  Hematologic/Lymphatic: Negative for Hematologic/Lymphatic symptoms  Cardiovascular : Leg swelling  Respiratory : Shortness of breath  Endocrine: Negative for endocrine symptoms  Musculoskeletal: Negative for musculoskeletal symptoms  Neurological: Negative for neurological symptoms  Psychologic: Negative for psychiatric symptoms

## 2019-11-17 NOTE — Progress Notes (Signed)
H&P  Chief Complaint: Pt is here for 6 month f/u. Pt denies any recent pain or symptoms associated with her renal calculi. Pt experiences ? gross hematuria approximately once per month. Pt endorses having a good stream.  History of Present Illness: [name] is a 86 y.o. year old female   1.26.2021: She returns today for follow-up today post repeat ESWL (on 1.4.2021) of right kidney to clear out stone fragments remaining after initial procedure. Repeat KUB 1.21.2021 shows stone fragment remains in her right kidney but is stable in size at 6 mm from its last measurement at time of her procedure. She did return today with what she believes to have been stone fragments but these appear to be blood clots.     (below copied from [facility] records):  [name] is a 86 year-old female established patient who is here for renal calculi after a surgical intervention.  The problem is on the right side. She had eswl for treatment of her renal calculi. Patient denies stent, ureteroscopy, and percutaneous lithotomy. This procedure was done 09/20/2016.   She underwent shockwave lithotripsy of a 9 mm right renal pelvic stone on 5.31.2018.   Followup on 6.5.2018 revealed continued presence of a 5 mm renal pelvic stone. KUB 7.3.2019--8 mm rt lower pole stone.   7.28.2020: Recent scan shows stable stone size but it has moved to pelvis of right kidney.   10.26.2020: ESL of rt renal pelvic stone--10x11 mm   11.17.2020: She returns for follow-up post ESWL of rt renal stone -- small stone fragment remaining in rt kidney. She reports that she tolerated her first procedure very well with no pain/discomfort following.      Past Medical History:  Diagnosis Date  . Anemia   . Ascending aortic aneurysm (HCC)    a. 4.6cm by CT 07/2016.  Marland Kitchen Chronic diastolic CHF (congestive heart failure) (HCC)   . Coronary artery calcification seen on CT scan 07/2016  . Dysrhythmia    a fib  . History of blood transfusion    . History of kidney stones   . Hypertension   . Hypomagnesemia   . PAF (paroxysmal atrial fibrillation) (HCC)    a. dx during adm 08/2016.  Marland Kitchen 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   Negative Jak2, BCR/ABL, normal epo level on 02/01/2014  . Sepsis (HCC) 08/2016  . Thrombocytopenia (HCC)   . Trigeminal neuralgia     Past Surgical History:  Procedure Laterality Date  . BREAST CYST EXCISION  60 yrs ago  . COLONOSCOPY WITH ESOPHAGOGASTRODUODENOSCOPY (EGD) N/A 03/10/2013   Procedure: COLONOSCOPY WITH ESOPHAGOGASTRODUODENOSCOPY (EGD);  Surgeon: Malissa Hippo, MD;  [facility];  Service: Endoscopy;  Laterality: N/A;  730  . EXTRACORPOREAL SHOCK WAVE LITHOTRIPSY Right 09/20/2016   Procedure: RIGHT EXTRACORPOREAL SHOCK WAVE LITHOTRIPSY (ESWL);  Surgeon: Alfredo Martinez, MD;  Location: [facility];  Service: Urology;  Laterality: Right;  . EXTRACORPOREAL SHOCK WAVE LITHOTRIPSY Right 02/16/2019   Procedure: EXTRACORPOREAL SHOCK WAVE LITHOTRIPSY (ESWL);  Surgeon: Marcine Matar, MD;  Location: [facility];  Service: Urology;  Laterality: Right;  . EXTRACORPOREAL SHOCK WAVE LITHOTRIPSY Right 04/27/2019   Procedure: EXTRACORPOREAL SHOCK WAVE LITHOTRIPSY (ESWL);  Surgeon: Marcine Matar, MD;  Location: [facility];  Service: Urology;  Laterality: Right;  . EYE SURGERY     cataract surgery bilat   . Gamma Knife     Trigeminal   . SLT LASER APPLICATION Left 09/27/2014   Procedure: SLT LASER APPLICATION;  Surgeon: Susa Simmonds, MD;  Location: [facility];  Service: Ophthalmology;  Laterality: Left;    Home Medications:  (Not in a hospital admission)   Allergies:  Allergies  Allergen Reactions  . Sulfa Antibiotics Other (See Comments)    Pt states that this med makes her feel crazy.      Family History  Problem Relation Age of Onset  . Congestive Heart Failure Mother   . Heart attack Father   . CAD Neg Hx     Social History:  reports that she has never smoked. She has never used smokeless  tobacco. She reports that she does not drink alcohol and does not use drugs.  ROS: A complete review of systems was performed.  All systems are negative except for pertinent findings as noted.  Physical Exam:  Vital signs in last 24 hours: BP: ()/()  Arterial Line BP: ()/()  General:  Alert and oriented, No acute distress HEENT: Normocephalic, atraumatic Neck: No JVD or lymphadenopathy Cardiovascular: Regular rate  Lungs: Normal inspiratory/expiratory excursion Neurologic: Grossly intact  Laboratory Data:  No results found for this or any previous visit (from the past 24 hour(s)). No results found for this or any previous visit (from the past 240 hour(s)). Creatinine: No results for input(s): CREATININE in the last 168 hours.  Radiologic Imaging: No results found.  Impression/Assessment:  H/O stones--no recent KUB though. Not symptomatic  Plan:  1. F/U today with radiology: KUB  2. Pt advised regarding methods to halt the progress of kidney stone formation: increase water & citrate intake, decrease salt intake.  3. F/U in one year depending on KUB results.  Kenney Houseman 11/17/2019, 9:42 AM  Bertram Millard. Severino Paolo MD

## 2019-11-20 ENCOUNTER — Telehealth: Payer: Self-pay

## 2019-11-20 NOTE — Telephone Encounter (Signed)
Pt called and made aware of results.

## 2019-11-23 ENCOUNTER — Telehealth: Payer: Self-pay

## 2019-11-23 NOTE — Progress Notes (Signed)
Lft msg to return call 

## 2019-11-23 NOTE — Telephone Encounter (Signed)
-----   Message from Marcine Matar, MD sent at 11/20/2019  1:08 PM EDT ----- Let pt know XR shows only 1 small stone in lt kidney--no mgmt needed ----- Message ----- From: Dalia Heading, LPN Sent: [id number]/[id number]   4:13 PM EDT To: Marcine Matar, MD  Pls. Review.

## 2019-11-23 NOTE — Telephone Encounter (Signed)
Lft msg to return call 

## 2019-12-07 DIAGNOSIS — B351 Tinea unguium: Secondary | ICD-10-CM | POA: Diagnosis not present

## 2019-12-07 DIAGNOSIS — I739 Peripheral vascular disease, unspecified: Secondary | ICD-10-CM | POA: Diagnosis not present

## 2019-12-07 DIAGNOSIS — L851 Acquired keratosis [keratoderma] palmaris et plantaris: Secondary | ICD-10-CM | POA: Diagnosis not present

## 2019-12-07 DIAGNOSIS — M79675 Pain in left toe(s): Secondary | ICD-10-CM | POA: Diagnosis not present

## 2020-01-05 DIAGNOSIS — N209 Urinary calculus, unspecified: Secondary | ICD-10-CM | POA: Diagnosis not present

## 2020-01-05 DIAGNOSIS — R7303 Prediabetes: Secondary | ICD-10-CM | POA: Diagnosis not present

## 2020-01-05 DIAGNOSIS — E663 Overweight: Secondary | ICD-10-CM | POA: Diagnosis not present

## 2020-01-05 DIAGNOSIS — R05 Cough: Secondary | ICD-10-CM | POA: Diagnosis not present

## 2020-01-05 DIAGNOSIS — Z87442 Personal history of urinary calculi: Secondary | ICD-10-CM | POA: Diagnosis not present

## 2020-01-05 DIAGNOSIS — N1831 Chronic kidney disease, stage 3a: Secondary | ICD-10-CM | POA: Diagnosis not present

## 2020-01-05 DIAGNOSIS — I10 Essential (primary) hypertension: Secondary | ICD-10-CM | POA: Diagnosis not present

## 2020-01-05 DIAGNOSIS — G500 Trigeminal neuralgia: Secondary | ICD-10-CM | POA: Diagnosis not present

## 2020-01-05 DIAGNOSIS — K219 Gastro-esophageal reflux disease without esophagitis: Secondary | ICD-10-CM | POA: Diagnosis not present

## 2020-01-05 DIAGNOSIS — D509 Iron deficiency anemia, unspecified: Secondary | ICD-10-CM | POA: Diagnosis not present

## 2020-01-05 DIAGNOSIS — E782 Mixed hyperlipidemia: Secondary | ICD-10-CM | POA: Diagnosis not present

## 2020-01-05 DIAGNOSIS — R7301 Impaired fasting glucose: Secondary | ICD-10-CM | POA: Diagnosis not present

## 2020-01-12 DIAGNOSIS — I5032 Chronic diastolic (congestive) heart failure: Secondary | ICD-10-CM | POA: Diagnosis not present

## 2020-01-12 DIAGNOSIS — R7303 Prediabetes: Secondary | ICD-10-CM | POA: Diagnosis not present

## 2020-01-12 DIAGNOSIS — I482 Chronic atrial fibrillation, unspecified: Secondary | ICD-10-CM | POA: Diagnosis not present

## 2020-01-12 DIAGNOSIS — I700 Atherosclerosis of aorta: Secondary | ICD-10-CM | POA: Diagnosis not present

## 2020-01-12 DIAGNOSIS — I712 Thoracic aortic aneurysm, without rupture: Secondary | ICD-10-CM | POA: Diagnosis not present

## 2020-01-12 DIAGNOSIS — E782 Mixed hyperlipidemia: Secondary | ICD-10-CM | POA: Diagnosis not present

## 2020-01-12 DIAGNOSIS — D509 Iron deficiency anemia, unspecified: Secondary | ICD-10-CM | POA: Diagnosis not present

## 2020-01-12 DIAGNOSIS — K219 Gastro-esophageal reflux disease without esophagitis: Secondary | ICD-10-CM | POA: Diagnosis not present

## 2020-01-12 DIAGNOSIS — I10 Essential (primary) hypertension: Secondary | ICD-10-CM | POA: Diagnosis not present

## 2020-01-12 DIAGNOSIS — N182 Chronic kidney disease, stage 2 (mild): Secondary | ICD-10-CM | POA: Diagnosis not present

## 2020-01-12 DIAGNOSIS — I2510 Atherosclerotic heart disease of native coronary artery without angina pectoris: Secondary | ICD-10-CM | POA: Diagnosis not present

## 2020-01-18 DIAGNOSIS — H40013 Open angle with borderline findings, low risk, bilateral: Secondary | ICD-10-CM | POA: Diagnosis not present

## 2020-01-18 DIAGNOSIS — H16221 Keratoconjunctivitis sicca, not specified as Sjogren's, right eye: Secondary | ICD-10-CM | POA: Diagnosis not present

## 2020-01-18 DIAGNOSIS — Z961 Presence of intraocular lens: Secondary | ICD-10-CM | POA: Diagnosis not present

## 2020-01-18 DIAGNOSIS — H25812 Combined forms of age-related cataract, left eye: Secondary | ICD-10-CM | POA: Diagnosis not present

## 2020-01-18 DIAGNOSIS — H35371 Puckering of macula, right eye: Secondary | ICD-10-CM | POA: Diagnosis not present

## 2020-01-18 DIAGNOSIS — H18519 Endothelial corneal dystrophy, unspecified eye: Secondary | ICD-10-CM | POA: Diagnosis not present

## 2020-01-20 DIAGNOSIS — Z23 Encounter for immunization: Secondary | ICD-10-CM | POA: Diagnosis not present

## 2020-02-02 DIAGNOSIS — I5032 Chronic diastolic (congestive) heart failure: Secondary | ICD-10-CM | POA: Diagnosis not present

## 2020-02-02 DIAGNOSIS — N182 Chronic kidney disease, stage 2 (mild): Secondary | ICD-10-CM | POA: Diagnosis not present

## 2020-02-02 DIAGNOSIS — E7849 Other hyperlipidemia: Secondary | ICD-10-CM | POA: Diagnosis not present

## 2020-02-02 DIAGNOSIS — I130 Hypertensive heart and chronic kidney disease with heart failure and stage 1 through stage 4 chronic kidney disease, or unspecified chronic kidney disease: Secondary | ICD-10-CM | POA: Diagnosis not present

## 2020-02-08 ENCOUNTER — Other Ambulatory Visit: Payer: Self-pay | Admitting: Thoracic Surgery (Cardiothoracic Vascular Surgery)

## 2020-02-08 DIAGNOSIS — I729 Aneurysm of unspecified site: Secondary | ICD-10-CM

## 2020-02-08 DIAGNOSIS — I712 Thoracic aortic aneurysm, without rupture, unspecified: Secondary | ICD-10-CM

## 2020-02-15 DIAGNOSIS — L851 Acquired keratosis [keratoderma] palmaris et plantaris: Secondary | ICD-10-CM | POA: Diagnosis not present

## 2020-02-15 DIAGNOSIS — M79675 Pain in left toe(s): Secondary | ICD-10-CM | POA: Diagnosis not present

## 2020-02-15 DIAGNOSIS — I739 Peripheral vascular disease, unspecified: Secondary | ICD-10-CM | POA: Diagnosis not present

## 2020-02-15 DIAGNOSIS — B351 Tinea unguium: Secondary | ICD-10-CM | POA: Diagnosis not present

## 2020-03-10 ENCOUNTER — Other Ambulatory Visit: Payer: Self-pay

## 2020-03-10 ENCOUNTER — Ambulatory Visit (HOSPITAL_COMMUNITY)
Admission: RE | Admit: 2020-03-10 | Discharge: 2020-03-10 | Disposition: A | Discharge status: Home / Self Care | Payer: PPO | Source: Ambulatory Visit | Attending: Thoracic Surgery (Cardiothoracic Vascular Surgery) | Admitting: Thoracic Surgery (Cardiothoracic Vascular Surgery)

## 2020-03-10 DIAGNOSIS — I712 Thoracic aortic aneurysm, without rupture, unspecified: Secondary | ICD-10-CM

## 2020-03-10 DIAGNOSIS — J811 Chronic pulmonary edema: Secondary | ICD-10-CM | POA: Diagnosis not present

## 2020-03-10 DIAGNOSIS — J9811 Atelectasis: Secondary | ICD-10-CM | POA: Diagnosis not present

## 2020-03-10 DIAGNOSIS — K449 Diaphragmatic hernia without obstruction or gangrene: Secondary | ICD-10-CM | POA: Diagnosis not present

## 2020-03-10 LAB — POCT I-STAT CREATININE: Creatinine, Ser: 1 mg/dL (ref 0.44–1.00)

## 2020-03-10 MED ORDER — IOHEXOL 350 MG/ML SOLN
80.0000 mL | Freq: Once | INTRAVENOUS | Status: AC | PRN
  Administered 2020-03-10: 80 mL via INTRAVENOUS

## 2020-03-15 ENCOUNTER — Other Ambulatory Visit: Payer: Self-pay

## 2020-03-15 ENCOUNTER — Encounter: Payer: Self-pay | Admitting: Thoracic Surgery (Cardiothoracic Vascular Surgery)

## 2020-03-15 ENCOUNTER — Ambulatory Visit: Payer: PPO | Admitting: Thoracic Surgery (Cardiothoracic Vascular Surgery)

## 2020-03-15 VITALS — BP 133/83 | HR 74 | Resp 18 | Ht 63.0 in | Wt 140.0 lb

## 2020-03-15 DIAGNOSIS — I712 Thoracic aortic aneurysm, without rupture, unspecified: Secondary | ICD-10-CM

## 2020-03-15 NOTE — Progress Notes (Signed)
[address]       [name] [address]             [phone]     HPI: [name] returns for a scheduled follow-up visit  Zayanna Pundt is an 86 year old woman with a history of coronary disease, chronic diastolic congestive heart failure, polycythemia, hypertension, paroxysmal atrial fibrillation, trigeminal neuralgia, thrombocytopenia, nephrolithiasis, and an ascending aortic aneurysm.  She was first found to have an ascending aneurysm in 2018.  It was 4.6 cm at that time.  By May 2020 had increased to 5 cm.    I last saw her in May 2021.  It was about 5 cm.  There has been some variability in measurements by radiology due to the asymmetric nature of the aneurysm.  She has been feeling well lately.  She is fairly active and still does her own housework.  She is not sure she would want to have surgery if indicated.  Past Medical History:  Diagnosis Date  . Anemia   . Ascending aortic aneurysm (HCC)    a. 4.6cm by CT 07/2016.  Marland Kitchen Chronic diastolic CHF (congestive heart failure) (HCC)   . Coronary artery calcification seen on CT scan 07/2016  . Dysrhythmia    a fib  . History of blood transfusion   . History of kidney stones   . Hypertension   . Hypomagnesemia   . PAF (paroxysmal atrial fibrillation) (HCC)    a. dx during adm 08/2016.  Marland Kitchen 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   Negative Jak2, BCR/ABL, normal epo level on 02/01/2014  . Sepsis (HCC) 08/2016  . Thrombocytopenia (HCC)   . Trigeminal neuralgia     Current Outpatient Medications  Medication Sig Dispense Refill  . Calcium Carbonate-Vitamin D (CALCIUM 600+D) 600-400 MG-UNIT tablet Take 1 tablet by mouth daily.    . Cinnamon 500 MG capsule Take 1,000 mg by mouth daily.    . Coenzyme Q10 (COQ10) 100 MG CAPS Take 200 mg by mouth daily.     Marland Kitchen diltiazem (CARDIZEM CD) 120 MG 24 hr capsule Take 1 capsule (120 mg total) by mouth daily. 90 capsule 3  . ELIQUIS 5 MG TABS tablet TAKE (1) TABLET BY MOUTH TWICE  DAILY. 180 tablet 0  . esomeprazole (NEXIUM) 40 MG capsule Take 40 mg by mouth daily.     . ferrous sulfate 325 (65 FE) MG tablet Take 325 mg by mouth daily.     . furosemide (LASIX) 20 MG tablet Take 1 tablet (20 mg total) by mouth daily as needed. 90 tablet 3  . gabapentin (NEURONTIN) 300 MG capsule Take 300 mg by mouth at bedtime.     . metoprolol succinate (TOPROL-XL) 25 MG 24 hr tablet Take 1 tablet (25 mg total) by mouth daily. 90 tablet 3  . Multiple Vitamin (MULTIVITAMIN WITH MINERALS) TABS tablet Take 1 tablet by mouth daily.    . NON FORMULARY Take 250 mg by mouth daily. Keratin    . pravastatin (PRAVACHOL) 40 MG tablet Take 40 mg by mouth at bedtime.     Marland Kitchen Specialty Vitamins Products (BIOTIN PLUS KERATIN PO) Take 250 mg by mouth.    . timolol (TIMOPTIC) 0.5 % ophthalmic solution Place 1 drop into both eyes daily.     No current facility-administered medications for this visit.    Physical Exam BP 133/83 (BP Location: Left Arm, Patient Position: Sitting)   Pulse 74   Resp 18   Ht 5\' 3"  (1.6  m)   Wt 140 lb (63.5 kg)   SpO2 95% Comment: RA with mask on  BMI 24.80 kg/m  Frail-appearing 86 year old woman in no acute distress Arrived in wheelchair Alert and oriented x3 with no focal deficits No carotid bruits Cardiac regular rate and rhythm with faint diastolic murmur Lungs clear bilaterally with no rales or wheezing 1+ edema right leg trace left leg  Diagnostic Tests: CT ANGIOGRAPHY CHEST WITH CONTRAST  TECHNIQUE: Multidetector CT imaging of the chest was performed using the standard protocol during bolus administration of intravenous contrast. Multiplanar CT image reconstructions and MIPs were obtained to evaluate the vascular anatomy.  CONTRAST:  76mL OMNIPAQUE IOHEXOL 350 MG/ML SOLN  COMPARISON:  Multiple prior studies, most recent dated 09/10/2019.  FINDINGS: Cardiovascular: Dilated ascending thoracic aorta, currently measuring 5.3 x 5.1 cm transversely,  at the level of the right pulmonary artery, and measuring 5.3 cm on the sagittal reconstructed view. On the most recent prior study, this aneurysm measured 5.1 cm in the same position on the sagittal reconstructed view, 5 cm on the transverse view.  Mild atherosclerosis noted along the arch and descending portions. Normal caliber of the arch and descending thoracic aorta. No dissection.  Heart is normal in size. No pericardial effusion. Minimal left coronary artery calcification.  Mediastinum/Nodes: No enlarged mediastinal, hilar, or axillary lymph nodes. Thyroid gland, trachea, and esophagus demonstrate no significant findings.  Lungs/Pleura: Mild basilar atelectasis. No evidence of pneumonia or pulmonary edema. No mass or suspicious nodule. No pleural effusion or pneumothorax.  Upper Abdomen: Moderate hiatal hernia.  No acute findings.  Musculoskeletal: No fracture or acute finding.  No bone lesion.  Review of the MIP images confirms the above findings.  IMPRESSION: 1. Ascending thoracic aortic aneurysm has mildly increased in size from the most recent prior and older CTA chest studies. Aneurysm currently measures 5.3 cm, previously 5.1 cm. Ascending thoracic aortic aneurysm. Recommend semi-annual imaging followup by CTA or MRA and referral to cardiothoracic surgery if not already obtained. This recommendation follows 2010 ACCF/AHA/AATS/ACR/ASA/SCA/SCAI/SIR/STS/SVM Guidelines for the Diagnosis and Management of Patients With Thoracic Aortic Disease. Circulation. 2010; 121: H962-I297. Aortic aneurysm NOS (ICD10-I71.9) 2. No other change. 3. No acute findings. 4. Mild aortic atherosclerosis.  No dissection.  Aortic aneurysm NOS (ICD10-I71.9).   Electronically Signed   By: Amie Portland M.D.   On: 03/10/2020 09:19 I personally reviewed the CT images and concur with the findings noted above.  Little or no change from her previous scan.  Impression: [name] is an 86 year old woman with a history of coronary disease, chronic diastolic congestive heart failure, polycythemia, hypertension, paroxysmal atrial fibrillation, trigeminal neuralgia, thrombocytopenia, nephrolithiasis, and an ascending aortic aneurysm.   Ascending aneurysm-continues to measure around 5.1 to 5.2 cm to my measurements.  Not significantly changed from her prior scan.  Marginal surgical candidate and not particularly interested in having surgery but does wish to continue with follow-up.  Hypertension-blood pressure well controlled with current regimen  Plan: Return in 6 months with CT angiogram of chest  Loreli Slot, MD [facility] [phone]

## 2020-04-22 DIAGNOSIS — E7849 Other hyperlipidemia: Secondary | ICD-10-CM | POA: Diagnosis not present

## 2020-04-22 DIAGNOSIS — I10 Essential (primary) hypertension: Secondary | ICD-10-CM | POA: Diagnosis not present

## 2020-04-22 DIAGNOSIS — Z23 Encounter for immunization: Secondary | ICD-10-CM | POA: Diagnosis not present

## 2020-04-22 DIAGNOSIS — N1831 Chronic kidney disease, stage 3a: Secondary | ICD-10-CM | POA: Diagnosis not present

## 2020-05-16 DIAGNOSIS — M79675 Pain in left toe(s): Secondary | ICD-10-CM | POA: Diagnosis not present

## 2020-05-16 DIAGNOSIS — B351 Tinea unguium: Secondary | ICD-10-CM | POA: Diagnosis not present

## 2020-05-16 DIAGNOSIS — L851 Acquired keratosis [keratoderma] palmaris et plantaris: Secondary | ICD-10-CM | POA: Diagnosis not present

## 2020-05-16 DIAGNOSIS — I739 Peripheral vascular disease, unspecified: Secondary | ICD-10-CM | POA: Diagnosis not present

## 2020-05-21 DIAGNOSIS — I10 Essential (primary) hypertension: Secondary | ICD-10-CM | POA: Diagnosis not present

## 2020-05-21 DIAGNOSIS — E782 Mixed hyperlipidemia: Secondary | ICD-10-CM | POA: Diagnosis not present

## 2020-05-21 DIAGNOSIS — Z23 Encounter for immunization: Secondary | ICD-10-CM | POA: Diagnosis not present

## 2020-05-21 DIAGNOSIS — N182 Chronic kidney disease, stage 2 (mild): Secondary | ICD-10-CM | POA: Diagnosis not present

## 2020-06-20 DIAGNOSIS — I10 Essential (primary) hypertension: Secondary | ICD-10-CM | POA: Diagnosis not present

## 2020-06-20 DIAGNOSIS — E782 Mixed hyperlipidemia: Secondary | ICD-10-CM | POA: Diagnosis not present

## 2020-07-18 DIAGNOSIS — R7301 Impaired fasting glucose: Secondary | ICD-10-CM | POA: Diagnosis not present

## 2020-07-18 DIAGNOSIS — Z6825 Body mass index (BMI) 25.0-25.9, adult: Secondary | ICD-10-CM | POA: Diagnosis not present

## 2020-07-18 DIAGNOSIS — N182 Chronic kidney disease, stage 2 (mild): Secondary | ICD-10-CM | POA: Diagnosis not present

## 2020-07-18 DIAGNOSIS — I130 Hypertensive heart and chronic kidney disease with heart failure and stage 1 through stage 4 chronic kidney disease, or unspecified chronic kidney disease: Secondary | ICD-10-CM | POA: Diagnosis not present

## 2020-07-18 DIAGNOSIS — I10 Essential (primary) hypertension: Secondary | ICD-10-CM | POA: Diagnosis not present

## 2020-07-18 DIAGNOSIS — R7303 Prediabetes: Secondary | ICD-10-CM | POA: Diagnosis not present

## 2020-07-18 DIAGNOSIS — Z6826 Body mass index (BMI) 26.0-26.9, adult: Secondary | ICD-10-CM | POA: Diagnosis not present

## 2020-07-18 DIAGNOSIS — E7849 Other hyperlipidemia: Secondary | ICD-10-CM | POA: Diagnosis not present

## 2020-07-18 DIAGNOSIS — I5032 Chronic diastolic (congestive) heart failure: Secondary | ICD-10-CM | POA: Diagnosis not present

## 2020-07-18 DIAGNOSIS — N1831 Chronic kidney disease, stage 3a: Secondary | ICD-10-CM | POA: Diagnosis not present

## 2020-07-18 DIAGNOSIS — D509 Iron deficiency anemia, unspecified: Secondary | ICD-10-CM | POA: Diagnosis not present

## 2020-07-18 DIAGNOSIS — E782 Mixed hyperlipidemia: Secondary | ICD-10-CM | POA: Diagnosis not present

## 2020-07-25 DIAGNOSIS — M79675 Pain in left toe(s): Secondary | ICD-10-CM | POA: Diagnosis not present

## 2020-07-25 DIAGNOSIS — B351 Tinea unguium: Secondary | ICD-10-CM | POA: Diagnosis not present

## 2020-07-25 DIAGNOSIS — L851 Acquired keratosis [keratoderma] palmaris et plantaris: Secondary | ICD-10-CM | POA: Diagnosis not present

## 2020-07-25 DIAGNOSIS — I739 Peripheral vascular disease, unspecified: Secondary | ICD-10-CM | POA: Diagnosis not present

## 2020-07-27 ENCOUNTER — Other Ambulatory Visit: Payer: Self-pay | Admitting: Thoracic Surgery (Cardiothoracic Vascular Surgery)

## 2020-07-27 DIAGNOSIS — I712 Thoracic aortic aneurysm, without rupture, unspecified: Secondary | ICD-10-CM

## 2020-07-27 DIAGNOSIS — R079 Chest pain, unspecified: Secondary | ICD-10-CM

## 2020-08-17 DIAGNOSIS — E7849 Other hyperlipidemia: Secondary | ICD-10-CM | POA: Diagnosis not present

## 2020-08-17 DIAGNOSIS — R7303 Prediabetes: Secondary | ICD-10-CM | POA: Diagnosis not present

## 2020-08-17 DIAGNOSIS — I10 Essential (primary) hypertension: Secondary | ICD-10-CM | POA: Diagnosis not present

## 2020-08-17 DIAGNOSIS — I5032 Chronic diastolic (congestive) heart failure: Secondary | ICD-10-CM | POA: Diagnosis not present

## 2020-08-17 DIAGNOSIS — L853 Xerosis cutis: Secondary | ICD-10-CM | POA: Diagnosis not present

## 2020-08-17 DIAGNOSIS — R944 Abnormal results of kidney function studies: Secondary | ICD-10-CM | POA: Diagnosis not present

## 2020-08-17 DIAGNOSIS — I482 Chronic atrial fibrillation, unspecified: Secondary | ICD-10-CM | POA: Diagnosis not present

## 2020-08-17 DIAGNOSIS — E782 Mixed hyperlipidemia: Secondary | ICD-10-CM | POA: Diagnosis not present

## 2020-08-17 DIAGNOSIS — I130 Hypertensive heart and chronic kidney disease with heart failure and stage 1 through stage 4 chronic kidney disease, or unspecified chronic kidney disease: Secondary | ICD-10-CM | POA: Diagnosis not present

## 2020-08-17 DIAGNOSIS — N182 Chronic kidney disease, stage 2 (mild): Secondary | ICD-10-CM | POA: Diagnosis not present

## 2020-08-17 DIAGNOSIS — I2510 Atherosclerotic heart disease of native coronary artery without angina pectoris: Secondary | ICD-10-CM | POA: Diagnosis not present

## 2020-08-27 ENCOUNTER — Other Ambulatory Visit: Payer: Self-pay

## 2020-08-27 ENCOUNTER — Ambulatory Visit (INDEPENDENT_AMBULATORY_CARE_PROVIDER_SITE_OTHER): Payer: PPO

## 2020-08-27 ENCOUNTER — Encounter: Payer: Self-pay | Admitting: Emergency Medicine

## 2020-08-27 ENCOUNTER — Ambulatory Visit
Admission: EM | Admit: 2020-08-27 | Discharge: 2020-08-27 | Disposition: A | Discharge status: Home / Self Care | Payer: PPO | Attending: Family Medicine | Admitting: Family Medicine

## 2020-08-27 DIAGNOSIS — R059 Cough, unspecified: Secondary | ICD-10-CM | POA: Diagnosis not present

## 2020-08-27 DIAGNOSIS — R0602 Shortness of breath: Secondary | ICD-10-CM

## 2020-08-27 DIAGNOSIS — J22 Unspecified acute lower respiratory infection: Secondary | ICD-10-CM

## 2020-08-27 DIAGNOSIS — I517 Cardiomegaly: Secondary | ICD-10-CM | POA: Diagnosis not present

## 2020-08-27 DIAGNOSIS — K449 Diaphragmatic hernia without obstruction or gangrene: Secondary | ICD-10-CM | POA: Diagnosis not present

## 2020-08-27 MED ORDER — PREDNISONE 20 MG PO TABS: 20.0000 mg | ORAL_TABLET | Freq: Every day | ORAL | 0 refills | Status: AC

## 2020-08-27 MED ORDER — CEFDINIR 300 MG PO CAPS: 600.0000 mg | ORAL_CAPSULE | Freq: Every day | ORAL | 0 refills | Status: AC

## 2020-08-27 MED ORDER — BENZONATATE 100 MG PO CAPS: 100.0000 mg | ORAL_CAPSULE | Freq: Three times a day (TID) | ORAL | 0 refills | Status: DC | PRN

## 2020-08-27 NOTE — Discharge Instructions (Addendum)
Complete all medication as prescribed. Your chest x-ray is normal.Follow-up with  your doctor if your symptoms do not improve

## 2020-08-27 NOTE — ED Triage Notes (Signed)
Cough and congestion for over a week.  Pt states she thinks she needs an abx.

## 2020-08-27 NOTE — ED Provider Notes (Signed)
[facility]    CSN: [id number] Arrival date & time: 08/27/20  0843      History   Chief Complaint Chief Complaint  Patient presents with  . Cough    HPI [name] is a 87 y.o. female.   HPI  Patient with a history of afib, ascending aortic aneurysm,  anemia, CHF , presents today with cough and congestion x 1 week. Endorses shortness of breath, wheezing, and decreased appetite since symptoms began. Cough has occasionally productive. Afebrile today.  Endorses lower extremity edema ankle which is normal. Past Medical History:  Diagnosis Date  . Anemia   . Ascending aortic aneurysm (HCC)    a. 4.6cm by CT 07/2016.  Marland Kitchen Chronic diastolic CHF (congestive heart failure) (HCC)   . Coronary artery calcification seen on CT scan 07/2016  . Dysrhythmia    a fib  . History of blood transfusion   . History of kidney stones   . Hypertension   . Hypomagnesemia   . PAF (paroxysmal atrial fibrillation) (HCC)    a. dx during adm 08/2016.  Marland Kitchen 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   Negative Jak2, BCR/ABL, normal epo level on 02/01/2014  . Sepsis (HCC) 08/2016  . Thrombocytopenia (HCC)   . Trigeminal neuralgia     Patient Active Problem List   Diagnosis Date Noted  . GERD (gastroesophageal reflux disease) 03/31/2019  . Dysphagia 12/22/2018  . Elevated troponin 09/11/2016  . PAF (paroxysmal atrial fibrillation) (HCC)   . Hypomagnesemia   . Hypertension   . Chronic diastolic CHF (congestive heart failure) (HCC)   . Acute pulmonary edema (HCC)   . Sepsis secondary to UTI (HCC) 08/22/2016  . Renal calculus, right 08/22/2016  . Thrombocytopenia (HCC) 08/22/2016  . Acute respiratory failure with hypoxia (HCC) 08/22/2016  . Ascending aortic aneurysm (HCC) 08/22/2016  . Demand ischemia (HCC) 08/22/2016  . Atrial fibrillation with rapid ventricular response (HCC) 08/20/2016  . Abnormal liver function   . Dyspnea   . Coronary artery calcification seen on CT scan 07/22/2016  .  URI (upper respiratory infection) 04/29/2014  . 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 Essential hypertension, benign 02/20/2013  . High cholesterol 02/20/2013    Past Surgical History:  Procedure Laterality Date  . BREAST CYST EXCISION  60 yrs ago  . COLONOSCOPY WITH ESOPHAGOGASTRODUODENOSCOPY (EGD) N/A 03/10/2013   Procedure: COLONOSCOPY WITH ESOPHAGOGASTRODUODENOSCOPY (EGD);  Surgeon: Malissa Hippo, MD;  Location: [facility];  Service: Endoscopy;  Laterality: N/A;  730  . EXTRACORPOREAL SHOCK WAVE LITHOTRIPSY Right 09/20/2016   Procedure: RIGHT EXTRACORPOREAL SHOCK WAVE LITHOTRIPSY (ESWL);  Surgeon: Alfredo Martinez, MD;  Location: [facility];  Service: Urology;  Laterality: Right;  . EXTRACORPOREAL SHOCK WAVE LITHOTRIPSY Right 02/16/2019   Procedure: EXTRACORPOREAL SHOCK WAVE LITHOTRIPSY (ESWL);  Surgeon: Marcine Matar, MD;  Location: [facility];  Service: Urology;  Laterality: Right;  . EXTRACORPOREAL SHOCK WAVE LITHOTRIPSY Right 04/27/2019   Procedure: EXTRACORPOREAL SHOCK WAVE LITHOTRIPSY (ESWL);  Surgeon: Marcine Matar, MD;  Location: [facility];  Service: Urology;  Laterality: Right;  . EYE SURGERY     cataract surgery bilat   . Gamma Knife     Trigeminal   . SLT LASER APPLICATION Left 09/27/2014   Procedure: SLT LASER APPLICATION;  Surgeon: Susa Simmonds, MD;  Location: [facility];  Service: Ophthalmology;  Laterality: Left;    OB History   No obstetric history on file.      Home Medications    Prior to Admission medications   Medication  Sig Start Date End Date Taking? Authorizing Provider  Calcium Carbonate-Vitamin D (CALCIUM 600+D) 600-400 MG-UNIT tablet Take 1 tablet by mouth daily.    [provider]  Cinnamon 500 MG capsule Take 1,000 mg by mouth daily.    [provider]  Coenzyme Q10 (COQ10) 100 MG CAPS Take 200 mg by mouth daily.     [provider]  diltiazem (CARDIZEM CD) 120 MG 24 hr capsule Take 1 capsule (120 mg total) by mouth  daily. 10/20/19   Strader, Lowanda Foster M, PA-C  ELIQUIS 5 MG TABS tablet TAKE (1) TABLET BY MOUTH TWICE DAILY. 08/03/19   Laqueta Linden, MD  esomeprazole (NEXIUM) 40 MG capsule Take 40 mg by mouth daily.  03/31/19   Malissa Hippo, MD  ferrous sulfate 325 (65 FE) MG tablet Take 325 mg by mouth daily.     [provider]  furosemide (LASIX) 20 MG tablet Take 1 tablet (20 mg total) by mouth daily as needed. 10/20/19   Strader, Lennart Pall, PA-C  gabapentin (NEURONTIN) 300 MG capsule Take 300 mg by mouth at bedtime.     [provider]  metoprolol succinate (TOPROL-XL) 25 MG 24 hr tablet Take 1 tablet (25 mg total) by mouth daily. 10/20/19   Strader, Lennart Pall, PA-C  Multiple Vitamin (MULTIVITAMIN WITH MINERALS) TABS tablet Take 1 tablet by mouth daily.    [provider]  NON FORMULARY Take 250 mg by mouth daily. Keratin    [provider]  pravastatin (PRAVACHOL) 40 MG tablet Take 40 mg by mouth at bedtime.     [provider]  Specialty Vitamins Products (BIOTIN PLUS KERATIN PO) Take 250 mg by mouth.    [provider]  timolol (TIMOPTIC) 0.5 % ophthalmic solution Place 1 drop into both eyes daily.    [provider]    Family History Family History  Problem Relation Age of Onset  . Congestive Heart Failure Mother   . Heart attack Father   . CAD Neg Hx     Social History Social History   Tobacco Use  . Smoking status: Never Smoker  . Smokeless tobacco: Never Used  Vaping Use  . Vaping Use: Never used  Substance Use Topics  . Alcohol use: No  . Drug use: No     Allergies   Sulfa antibiotics   Review of Systems Review of Systems Pertinent negatives listed in HPI   Physical Exam Triage Vital Signs ED Triage Vitals  Enc Vitals Group     BP 08/27/20 0959 (!) 147/81     Pulse Rate 08/27/20 0959 71     Resp 08/27/20 0959 18     Temp 08/27/20 0959 98.2 F (36.8 C)     Temp Source 08/27/20 0959 Oral     SpO2  08/27/20 0959 94 %     Weight --      Height --      Head Circumference --      Peak Flow --      Pain Score 08/27/20 0958 0     Pain Loc --      Pain Edu? --      Excl. in GC? --    No data found.  Updated Vital Signs BP (!) 147/81 (BP Location: Right Arm)   Pulse 71   Temp 98.2 F (36.8 C) (Oral)   Resp 18   SpO2 94%   Visual Acuity Right Eye Distance:   Left Eye Distance:  Bilateral Distance:    Right Eye Near:   Left Eye Near:    Bilateral Near:     Physical Exam General appearance: alert, Ill-appearing, no distress Head: Normocephalic, without obvious abnormality, atraumatic EENT: Perrla, Ears normal, nares patent, oropharynx patent  Respiratory: Respirations even , unlabored, coarse lung sound, expiratory wheeze Heart: rate and rhythm normal. No gallop or murmurs noted on exam  Abdomen: BS +, no distention, no rebound tenderness, or no mass Extremities: No gross deformities Skin: Skin color, texture, turgor normal. No rashes seen  Psych: Appropriate mood and affect. Neurologic: GCS 15, normal coordination,  Normal gait  UC Treatments / Results  Labs (all labs ordered are listed, but only abnormal results are displayed) Labs Reviewed - No data to display  EKG   Radiology No results found.  Procedures Procedures (including critical care time)  Medications Ordered in UC Medications - No data to display  Initial Impression / Assessment and Plan / UC Course  I have reviewed the triage vital signs and the nursing notes.  Pertinent labs & imaging results that were available during my care of the patient were reviewed by me and considered in my medical decision making (see chart for details).    Treating for lower respiratory infection with associated shortness of breath and associated  Cough.  Treatment per discharge instructions. VSS. No acute distress. CXR negative for pneumonia or vascular congestion.  Follow-up with PCP as needed. Final Clinical  Impressions(s) / UC Diagnoses   Final diagnoses:  Cough  SOB (shortness of breath)  Lower respiratory infection (e.g., bronchitis, pneumonia, pneumonitis, pulmonitis)     Discharge Instructions     Complete all medication as prescribed. Your chest x-ray is normal.Follow-up with  your doctor if your symptoms do not improve    ED Prescriptions    Medication Sig Dispense Auth. Provider   benzonatate (TESSALON) 100 MG capsule Take 1-2 capsules (100-200 mg total) by mouth 3 (three) times daily as needed for cough. 30 capsule Bing Neighbors, FNP   predniSONE (DELTASONE) 20 MG tablet Take 1 tablet (20 mg total) by mouth daily with breakfast for 5 days. 5 tablet Bing Neighbors, FNP   cefdinir (OMNICEF) 300 MG capsule Take 2 capsules (600 mg total) by mouth daily for 7 days. 14 capsule Bing Neighbors, FNP     PDMP not reviewed this encounter.   Bing Neighbors, FNP 08/27/20 1157

## 2020-09-13 ENCOUNTER — Ambulatory Visit: Payer: PPO | Admitting: Thoracic Surgery (Cardiothoracic Vascular Surgery)

## 2020-09-13 ENCOUNTER — Ambulatory Visit
Admission: RE | Admit: 2020-09-13 | Discharge: 2020-09-13 | Disposition: A | Discharge status: Home / Self Care | Payer: PPO | Source: Ambulatory Visit | Attending: Thoracic Surgery (Cardiothoracic Vascular Surgery) | Admitting: Thoracic Surgery (Cardiothoracic Vascular Surgery)

## 2020-09-13 ENCOUNTER — Encounter: Payer: Self-pay | Admitting: Thoracic Surgery (Cardiothoracic Vascular Surgery)

## 2020-09-13 ENCOUNTER — Other Ambulatory Visit: Payer: Self-pay

## 2020-09-13 VITALS — BP 163/89 | HR 71 | Resp 20 | Ht 63.0 in | Wt 138.0 lb

## 2020-09-13 DIAGNOSIS — I712 Thoracic aortic aneurysm, without rupture, unspecified: Secondary | ICD-10-CM

## 2020-09-13 DIAGNOSIS — K449 Diaphragmatic hernia without obstruction or gangrene: Secondary | ICD-10-CM | POA: Diagnosis not present

## 2020-09-13 DIAGNOSIS — I2510 Atherosclerotic heart disease of native coronary artery without angina pectoris: Secondary | ICD-10-CM | POA: Diagnosis not present

## 2020-09-13 DIAGNOSIS — J984 Other disorders of lung: Secondary | ICD-10-CM | POA: Diagnosis not present

## 2020-09-13 MED ORDER — IOPAMIDOL (ISOVUE-370) INJECTION 76%
75.0000 mL | Freq: Once | INTRAVENOUS | Status: AC | PRN
  Administered 2020-09-13: 75 mL via INTRAVENOUS

## 2020-09-13 NOTE — Progress Notes (Signed)
[address]       [name] [address]             [phone]     HPI: Mrs. Stege returns for a scheduled follow-up regarding her ascending aneurysm  Iliany Losier is a 87 year old woman with a history of CAD, chronic diastolic congestive heart failure, polycythemia, hypertension, paroxysmal atrial fibrillation, trigeminal neuralgia, thrombocytopenia, nephrolithiasis, and an ascending aneurysm.  She was first found to have an ascending aneurysm in 2018.  It was 4.6 cm at that time.  By May 2020 it had increased in size to 5 cm.  I last saw her in November 2021.  Measured 5.2 cm at that time.  She is not interested in any surgical intervention and would be a poor surgical candidate.  She did however want to continue to follow the aneurysm.  She says she continues to have some swelling in her legs although that has been a little better recently.  She is not having any chest pain, pressure, or tightness.  She would not be interested in any heroic measures in the setting of a catastrophe.  Past Medical History:  Diagnosis Date  . Anemia   . Ascending aortic aneurysm (HCC)    a. 4.6cm by CT 07/2016.  Marland Kitchen Chronic diastolic CHF (congestive heart failure) (HCC)   . Coronary artery calcification seen on CT scan 07/2016  . Dysrhythmia    a fib  . History of blood transfusion   . History of kidney stones   . Hypertension   . Hypomagnesemia   . PAF (paroxysmal atrial fibrillation) (HCC)    a. dx during adm 08/2016.  Marland Kitchen 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   Negative Jak2, BCR/ABL, normal epo level on 02/01/2014  . Sepsis (HCC) 08/2016  . Thrombocytopenia (HCC)   . Trigeminal neuralgia     Current Outpatient Medications  Medication Sig Dispense Refill  . benzonatate (TESSALON) 100 MG capsule Take 1-2 capsules (100-200 mg total) by mouth 3 (three) times daily as needed for cough. 30 capsule 0  . Calcium Carbonate-Vitamin D 600-400 MG-UNIT tablet Take 1 tablet by mouth  daily.    . Cinnamon 500 MG capsule Take 1,000 mg by mouth daily.    . Coenzyme Q10 (COQ10) 100 MG CAPS Take 200 mg by mouth daily.     Marland Kitchen diltiazem (CARDIZEM CD) 120 MG 24 hr capsule Take 1 capsule (120 mg total) by mouth daily. 90 capsule 3  . ELIQUIS 5 MG TABS tablet TAKE (1) TABLET BY MOUTH TWICE DAILY. 180 tablet 0  . esomeprazole (NEXIUM) 40 MG capsule Take 40 mg by mouth daily.     . ferrous sulfate 325 (65 FE) MG tablet Take 325 mg by mouth daily.     . furosemide (LASIX) 20 MG tablet Take 1 tablet (20 mg total) by mouth daily as needed. 90 tablet 3  . gabapentin (NEURONTIN) 300 MG capsule Take 300 mg by mouth at bedtime.     . metoprolol succinate (TOPROL-XL) 25 MG 24 hr tablet Take 1 tablet (25 mg total) by mouth daily. 90 tablet 3  . Multiple Vitamin (MULTIVITAMIN WITH MINERALS) TABS tablet Take 1 tablet by mouth daily.    . NON FORMULARY Take 250 mg by mouth daily. Keratin    . pravastatin (PRAVACHOL) 40 MG tablet Take 40 mg by mouth at bedtime.     Marland Kitchen Specialty Vitamins Products (BIOTIN PLUS KERATIN PO) Take 250 mg by mouth.    Marland Kitchen  timolol (TIMOPTIC) 0.5 % ophthalmic solution Place 1 drop into both eyes daily.     No current facility-administered medications for this visit.    Physical Exam BP (!) 163/89 (BP Location: Right Arm, Patient Position: Sitting)   Pulse 71   Resp 20   Ht 5\' 3"  (1.6 m)   Wt 138 lb (62.6 kg)   SpO2 96% Comment: RA  BMI 24.10 kg/m  87 year old woman in no acute distress Alert and oriented x3 with no focal deficits Cardiac regular rate and rhythm with a 2/6 diastolic murmur Lungs clear bilaterally Trace edema both legs  Diagnostic Tests: CT ANGIOGRAPHY CHEST WITH CONTRAST  TECHNIQUE: Multidetector CT imaging of the chest was performed using the standard protocol during bolus administration of intravenous contrast. Multiplanar CT image reconstructions and MIPs were obtained to evaluate the vascular anatomy.  CONTRAST:  64mL ISOVUE-370  IOPAMIDOL (ISOVUE-370) INJECTION 76%  COMPARISON:  None.  FINDINGS: Cardiovascular: Stable fusiform aneurysmal dilatation of the ascending thoracic aorta measuring 5.2 x 5.1 cm, previously 5.3 x 5.1 cm. Stable atherosclerotic changes. Remainder of the aorta is slightly tortuous but normal in caliber. Patent 3 vessel arch anatomy with tortuosity noted as before. No acute dissection. No mediastinal hemorrhage or hematoma. Pulmonary arteries are patent and normal in caliber. No significant filling defect or pulmonary embolus by CTA.  Stable heart size.  No pericardial effusion.  Mediastinum/Nodes: Thyroid unremarkable. Trachea and central airways are patent. Esophagus nondilated. Large hiatal hernia again noted. No adenopathy.  Lungs/Pleura: Similar areas of bilateral parenchymal scarring in the anterior right upper lobe, lingula, and both lung bases.  No acute airspace process, a new collapse or consolidation. No interstitial process or edema. No pleural abnormality, effusion or pneumothorax.  Upper Abdomen: Known left upper pole renal cyst only partially imaged. No acute upper abdominal finding.  Musculoskeletal: Degenerative changes of the spine. No chest wall soft tissue asymmetry or focal abnormality.  Review of the MIP images confirms the above findings.  IMPRESSION: Stable 5.2 cm aneurysm of the ascending thoracic aorta.  Ascending thoracic aortic aneurysm. Recommend semi-annual imaging followup by CTA or MRA and referral to cardiothoracic surgery if not already obtained. This recommendation follows 2010 ACCF/AHA/AATS/ACR/ASA/SCA/SCAI/SIR/STS/SVM Guidelines for the Diagnosis and Management of Patients With Thoracic Aortic Disease. Circulation. 2010; 1212011. Aortic aneurysm NOS (ICD10-I71.9)  No acute pulmonary embolus.  No other acute intrathoracic vascular finding.  Stable large hiatal hernia  Aortic aneurysm NOS (ICD10-I71.9).  Aortic  Atherosclerosis (ICD10-I70.0).   Electronically Signed   By: : [id number].  Shick M.D.   On: 09/13/2020 09:56 I personally reviewed the CT images and concur with the findings noted above  Impression: Shevy Yaney is a 87 year old woman with a history of CAD, chronic diastolic congestive heart failure, polycythemia, hypertension, paroxysmal atrial fibrillation, trigeminal neuralgia, thrombocytopenia, nephrolithiasis, and an ascending aneurysm.  Ascending aneurysm-stable at 5.2 cm.  She is not a good candidate for elective surgery and certainly not a candidate for emergency surgery.  Importance of blood pressure control emphasized.  We will continued semiannual follow-up at her request.  Hypertension-blood pressure elevated today.  She thinks is because of some repeat lab testing and delays around the time of her scan.  She was frustrated by that.  Currently on metoprolol, diltiazem, and furosemide.  Followed by Dr. 95.  Plan:  Monitor blood pressure at home and report if greater than 140 systolic  Advised patient to talk with family about wishes not to have emergency surgery if faced with situation where she  was not able to communicate for herself.  Return in 6 months with CT of chest.  Loreli Slot, MD [facility] [phone]

## 2020-10-17 DIAGNOSIS — M79675 Pain in left toe(s): Secondary | ICD-10-CM | POA: Diagnosis not present

## 2020-10-17 DIAGNOSIS — I739 Peripheral vascular disease, unspecified: Secondary | ICD-10-CM | POA: Diagnosis not present

## 2020-10-17 DIAGNOSIS — B351 Tinea unguium: Secondary | ICD-10-CM | POA: Diagnosis not present

## 2020-10-17 DIAGNOSIS — L851 Acquired keratosis [keratoderma] palmaris et plantaris: Secondary | ICD-10-CM | POA: Diagnosis not present

## 2020-10-26 IMAGING — DX DG ABDOMEN 1V
2 series · 2 of 2 positions shown · non-contrast
Comparison: 11/05/2017

CLINICAL DATA: History of hematuria, follow-up renal calculus

EXAM:
ABDOMEN - 1 VIEW

[abdomen kub (1 of 2)]
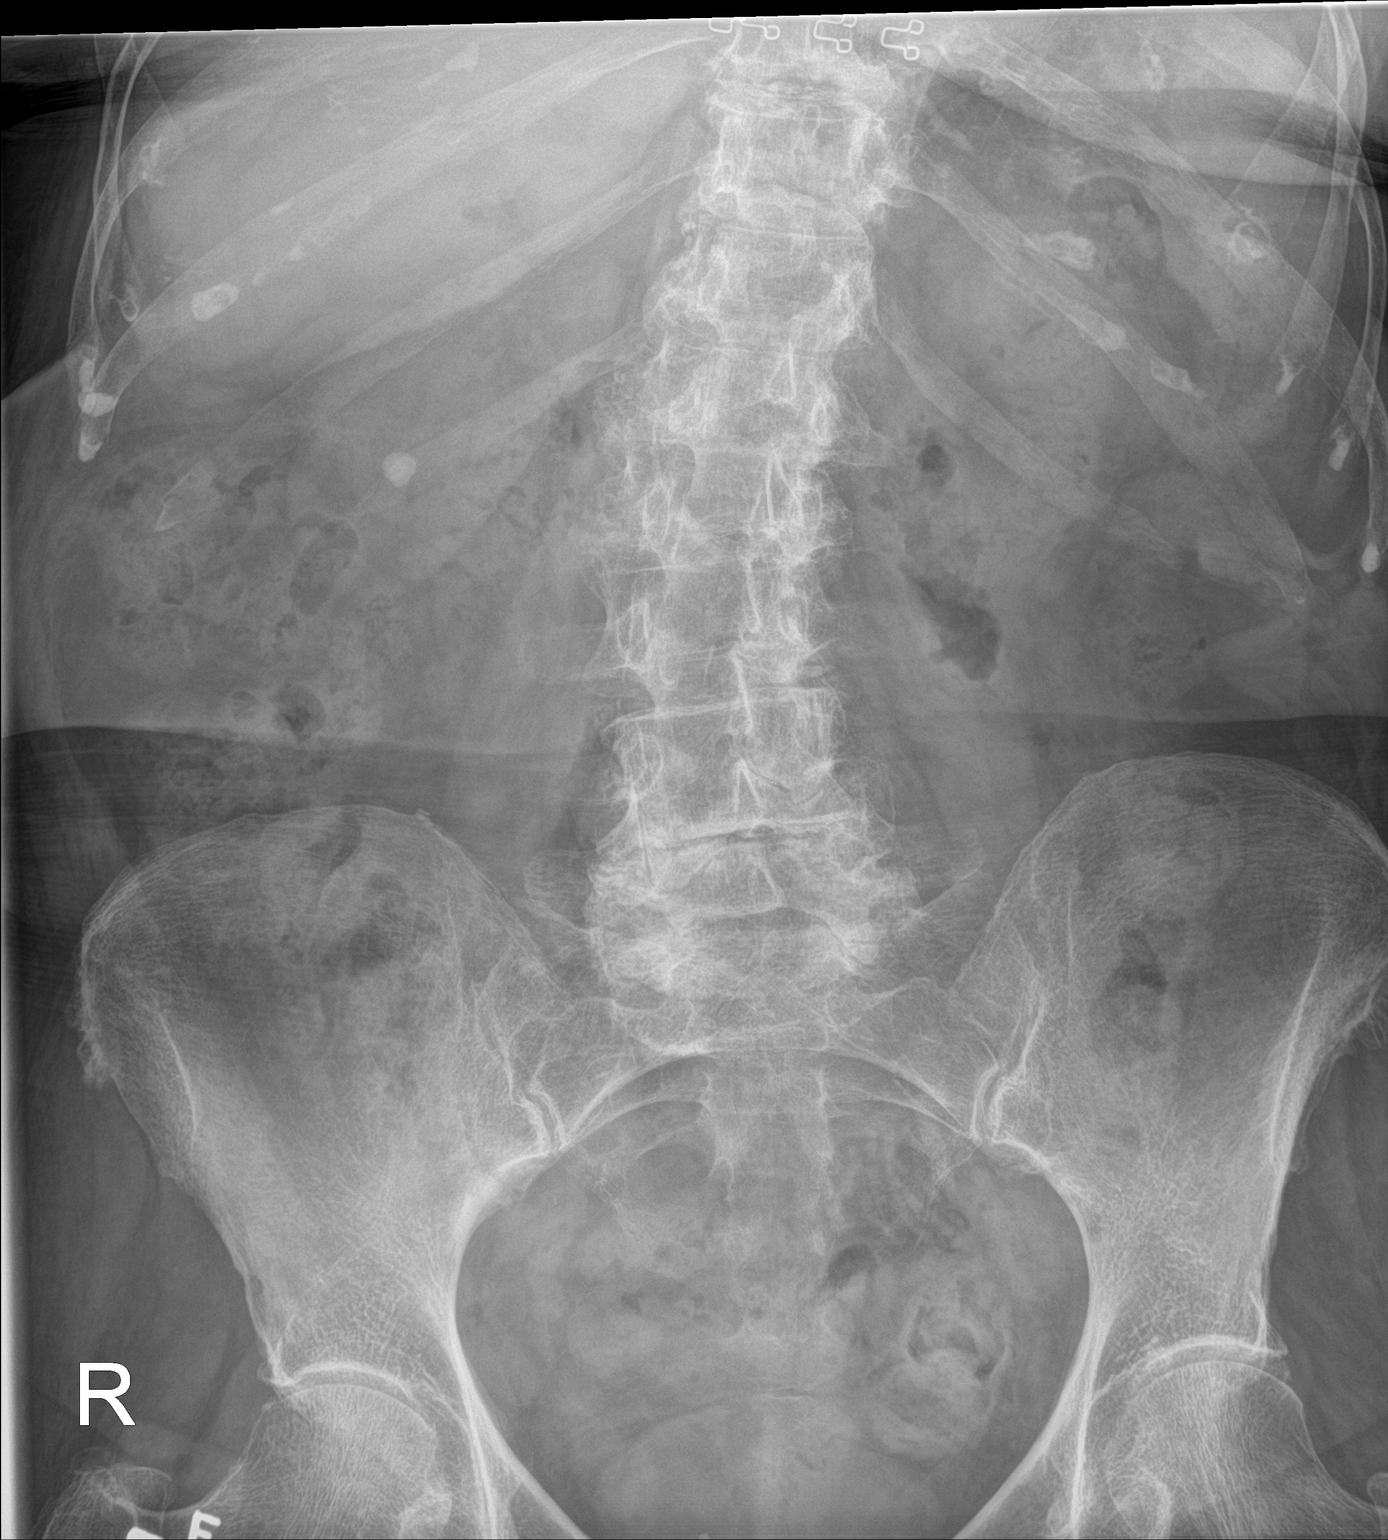

[abdomen kub (2 of 2)]
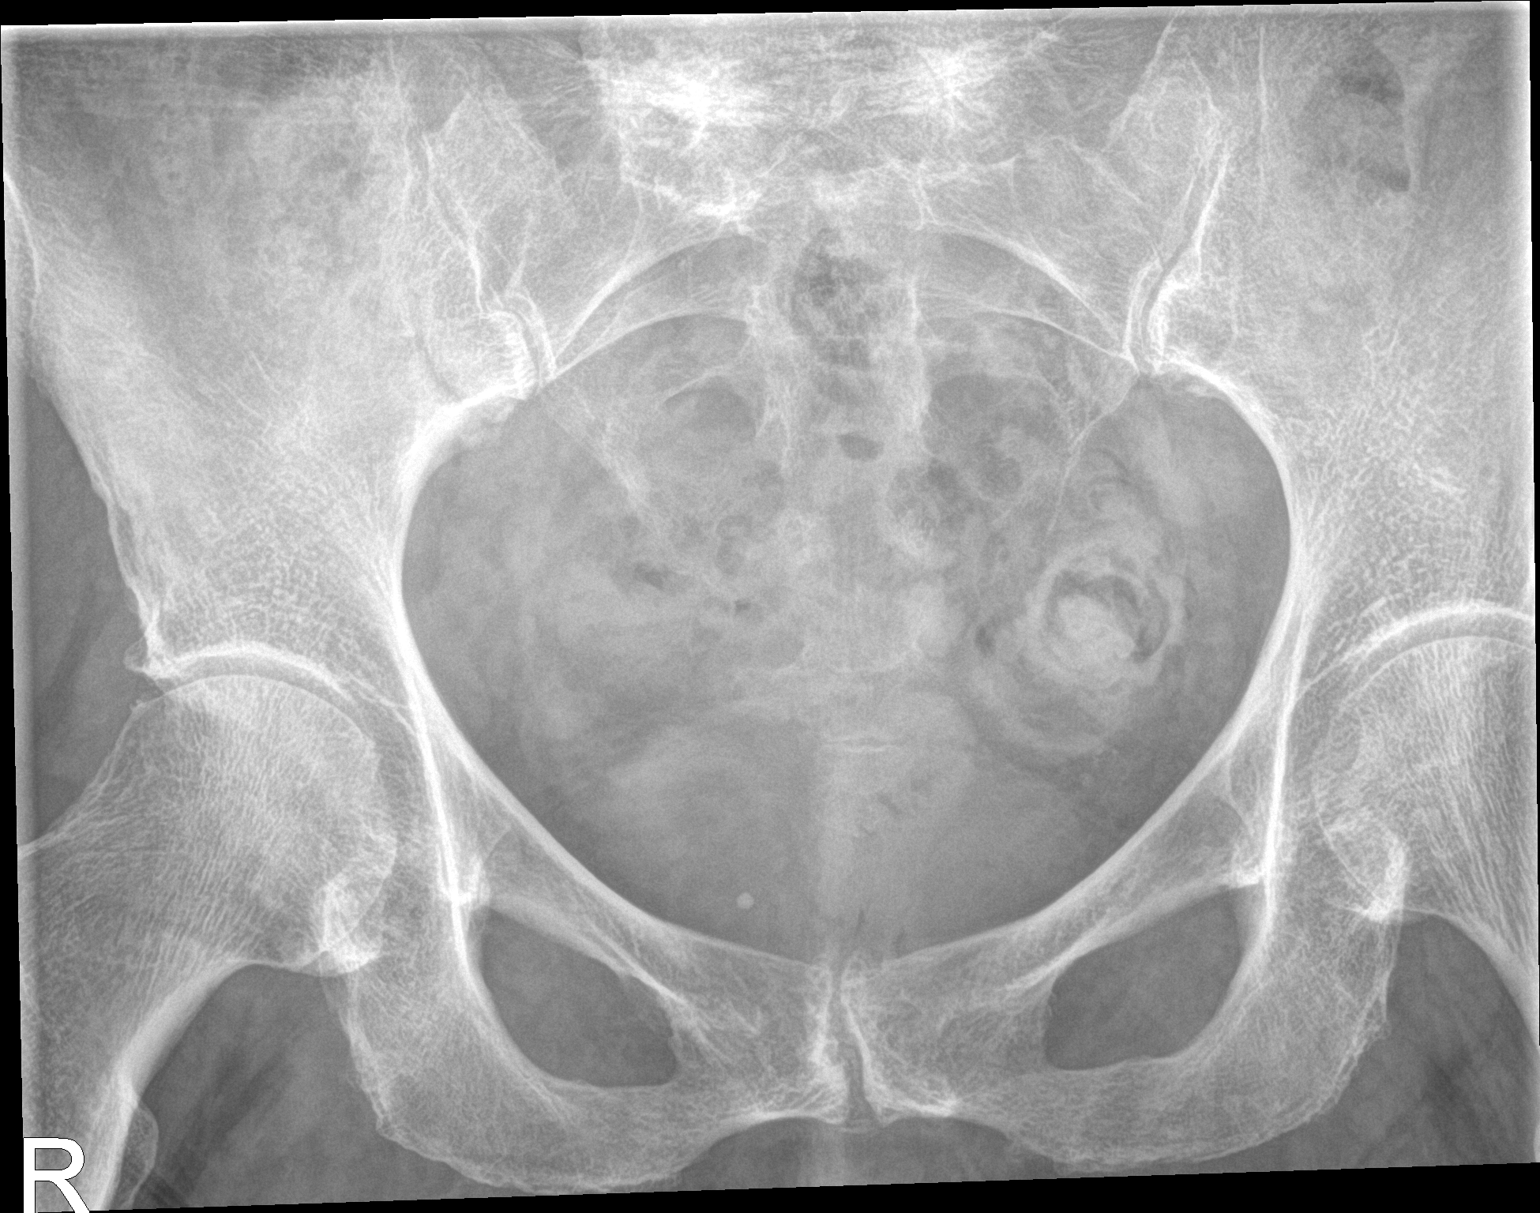

[2 of 2 positions shown; findings below may reference images not displayed]

FINDINGS: Right renal calculus is again identified in the lower pole stable
from the previous exam. No definitive left renal calculi are seen.
No ureteral stones are noted. Phlebolith is noted within the right
hemipelvis. Scattered large and small bowel gas is noted. No
obstructive changes are seen. Degenerative changes of lumbar spine
are noted.
IMPRESSION: Stable right renal stone.

## 2020-10-28 DIAGNOSIS — R7301 Impaired fasting glucose: Secondary | ICD-10-CM | POA: Diagnosis not present

## 2020-10-28 DIAGNOSIS — I10 Essential (primary) hypertension: Secondary | ICD-10-CM | POA: Diagnosis not present

## 2020-11-01 DIAGNOSIS — D509 Iron deficiency anemia, unspecified: Secondary | ICD-10-CM | POA: Diagnosis not present

## 2020-11-01 DIAGNOSIS — I5032 Chronic diastolic (congestive) heart failure: Secondary | ICD-10-CM | POA: Diagnosis not present

## 2020-11-01 DIAGNOSIS — E663 Overweight: Secondary | ICD-10-CM | POA: Diagnosis not present

## 2020-11-01 DIAGNOSIS — K219 Gastro-esophageal reflux disease without esophagitis: Secondary | ICD-10-CM | POA: Diagnosis not present

## 2020-11-01 DIAGNOSIS — I712 Thoracic aortic aneurysm, without rupture: Secondary | ICD-10-CM | POA: Diagnosis not present

## 2020-11-01 DIAGNOSIS — R7303 Prediabetes: Secondary | ICD-10-CM | POA: Diagnosis not present

## 2020-11-01 DIAGNOSIS — E782 Mixed hyperlipidemia: Secondary | ICD-10-CM | POA: Diagnosis not present

## 2020-11-01 DIAGNOSIS — I482 Chronic atrial fibrillation, unspecified: Secondary | ICD-10-CM | POA: Diagnosis not present

## 2020-11-01 DIAGNOSIS — I2510 Atherosclerotic heart disease of native coronary artery without angina pectoris: Secondary | ICD-10-CM | POA: Diagnosis not present

## 2020-11-01 DIAGNOSIS — I10 Essential (primary) hypertension: Secondary | ICD-10-CM | POA: Diagnosis not present

## 2020-11-01 DIAGNOSIS — I129 Hypertensive chronic kidney disease with stage 1 through stage 4 chronic kidney disease, or unspecified chronic kidney disease: Secondary | ICD-10-CM | POA: Diagnosis not present

## 2020-11-13 NOTE — Progress Notes (Signed)
History of Present Illness: [name] is a 87 y.o. year old female here for followup of urolithiasis.  She underwent shockwave lithotripsy of a 9 mm right renal pelvic stone on 5.31.2018.   Followup on 6.5.2018 revealed continued presence of a 5 mm renal pelvic stone. KUB 7.3.2019--8 mm rt lower pole stone.   7.28.2020: Recent scan shows stable stone size but it has moved to pelvis of right kidney.   10.26.2020: ESL of rt renal pelvic stone--10x11 mm   11.17.2020: She returns for follow-up post ESWL of rt renal stone -- small stone fragment remaining in rt kidney. She reports that she tolerated her first procedure very well with no pain/discomfort following.  1.26.2021: She returns today for follow-up today post repeat ESWL (on 1.4.2021) of right kidney to clear out stone fragments remaining after initial procedure. Repeat KUB 1.21.2021 shows stone fragment remains in her right kidney but is stable in size at 6 mm from its last measurement at time of her procedure.   7.26.2022: Here for routine follow-up.  No stone episodes over the past year.  No gross hematuria, dysuria, flank pain.  KUB yesterday.  This revealed a 7 mm right lower pole stone.  Past Medical History:  Diagnosis Date   Anemia    Ascending aortic aneurysm (HCC)    a. 4.6cm by CT 07/2016.   Chronic diastolic CHF (congestive heart failure) (HCC)    Coronary artery calcification seen on CT scan 07/2016   Dysrhythmia    a fib   History of blood transfusion    History of kidney stones    Hypertension    Hypomagnesemia    PAF (paroxysmal atrial fibrillation) (HCC)    a. dx during adm 08/2016.   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   Negative Jak2, BCR/ABL, normal epo level on 02/01/2014   Sepsis (HCC) 08/2016   Thrombocytopenia (HCC)    Trigeminal neuralgia     Past Surgical History:  Procedure Laterality Date   BREAST CYST EXCISION  60 yrs ago   COLONOSCOPY WITH ESOPHAGOGASTRODUODENOSCOPY (EGD) N/A 03/10/2013    Procedure: COLONOSCOPY WITH ESOPHAGOGASTRODUODENOSCOPY (EGD);  Surgeon: Malissa Hippo, MD;  Location: [facility];  Service: Endoscopy;  Laterality: N/A;  730   EXTRACORPOREAL SHOCK WAVE LITHOTRIPSY Right 09/20/2016   Procedure: RIGHT EXTRACORPOREAL SHOCK WAVE LITHOTRIPSY (ESWL);  Surgeon: Alfredo Martinez, MD;  Location: [facility];  Service: Urology;  Laterality: Right;   EXTRACORPOREAL SHOCK WAVE LITHOTRIPSY Right 02/16/2019   Procedure: EXTRACORPOREAL SHOCK WAVE LITHOTRIPSY (ESWL);  Surgeon: Marcine Matar, MD;  Location: [facility];  Service: Urology;  Laterality: Right;   EXTRACORPOREAL SHOCK WAVE LITHOTRIPSY Right 04/27/2019   Procedure: EXTRACORPOREAL SHOCK WAVE LITHOTRIPSY (ESWL);  Surgeon: Marcine Matar, MD;  Location: [facility];  Service: Urology;  Laterality: Right;   EYE SURGERY     cataract surgery bilat    Gamma Knife     Trigeminal    SLT LASER APPLICATION Left 09/27/2014   Procedure: SLT LASER APPLICATION;  Surgeon: Susa Simmonds, MD;  Location: [facility];  Service: Ophthalmology;  Laterality: Left;    Home Medications:  (Not in a hospital admission)   Allergies:  Allergies  Allergen Reactions   Sulfa Antibiotics Other (See Comments)    Pt states that this med makes her feel crazy.      Family History  Problem Relation Age of Onset   Congestive Heart Failure Mother    Heart attack Father    CAD Neg Hx  Social History:  reports that she has never smoked. She has never used smokeless tobacco. She reports that she does not drink alcohol and does not use drugs.  ROS: A complete review of systems was performed.  All systems are negative except for pertinent findings as noted.  Physical Exam:  Vital signs in last 24 hours: @VSRANGES @ General:  Alert and oriented, No acute distress HEENT: Normocephalic, atraumatic Neck: No JVD or lymphadenopathy Cardiovascular: Regular rate  Lungs: Normal inspiratory/expiratory excursion Extremities: No edema Neurologic:  Grossly intact  I have reviewed prior pt notes  I have reviewed notes from referring/previous physicians  I have reviewed urinalysis results  I have independently reviewed prior imaging--KUB   Impression/Assessment:  Stable right lower pole stone  Plan:  I will see her back in 1 year for recheck with KUB  [name] 11/13/2020, 12:52 PM  11/15/2020. Kaitlyn Woolbright MD

## 2020-11-14 ENCOUNTER — Other Ambulatory Visit: Payer: Self-pay

## 2020-11-14 ENCOUNTER — Ambulatory Visit (HOSPITAL_COMMUNITY)
Admission: RE | Admit: 2020-11-14 | Discharge: 2020-11-14 | Disposition: A | Discharge status: Home / Self Care | Payer: PPO | Source: Ambulatory Visit | Attending: Urology | Admitting: Urology

## 2020-11-14 DIAGNOSIS — N200 Calculus of kidney: Secondary | ICD-10-CM | POA: Diagnosis not present

## 2020-11-14 DIAGNOSIS — Z87442 Personal history of urinary calculi: Secondary | ICD-10-CM | POA: Diagnosis not present

## 2020-11-14 DIAGNOSIS — M5134 Other intervertebral disc degeneration, thoracic region: Secondary | ICD-10-CM | POA: Diagnosis not present

## 2020-11-15 ENCOUNTER — Encounter: Payer: Self-pay | Admitting: Urology

## 2020-11-15 ENCOUNTER — Ambulatory Visit: Payer: PPO | Admitting: Urology

## 2020-11-15 VITALS — BP 168/109 | HR 91

## 2020-11-15 DIAGNOSIS — Z87442 Personal history of urinary calculi: Secondary | ICD-10-CM | POA: Diagnosis not present

## 2020-11-15 LAB — URINALYSIS, ROUTINE W REFLEX MICROSCOPIC
Bilirubin, UA: NEGATIVE
Glucose, UA: NEGATIVE
Ketones, UA: NEGATIVE
Nitrite, UA: POSITIVE — AB
Protein,UA: NEGATIVE
Specific Gravity, UA: 1.025 (ref 1.005–1.030)
Urobilinogen, Ur: 0.2 mg/dL (ref 0.2–1.0)
pH, UA: 6 (ref 5.0–7.5)

## 2020-11-15 LAB — MICROSCOPIC EXAMINATION
Renal Epithel, UA: NONE SEEN /hpf
WBC, UA: >30 /hpf — AB (ref 0–5)

## 2020-11-15 NOTE — Progress Notes (Signed)
Urological Symptom Review  Patient is experiencing the following symptoms: Frequent urination   Review of Systems  Gastrointestinal (upper)  : Negative for upper GI symptoms  Gastrointestinal (lower) : Negative for lower GI symptoms  Constitutional : Negative for symptoms  Skin: Negative for skin symptoms  Eyes: Negative for eye symptoms  Ear/Nose/Throat : Negative for Ear/Nose/Throat symptoms  Hematologic/Lymphatic: Negative for Hematologic/Lymphatic symptoms  Cardiovascular : Leg swelling  Respiratory : Negative for respiratory symptoms  Endocrine: Negative for endocrine symptoms  Musculoskeletal: Negative for musculoskeletal symptoms  Neurological: Negative for neurological symptoms  Psychologic: Negative for psychiatric symptoms 

## 2020-12-21 DIAGNOSIS — I10 Essential (primary) hypertension: Secondary | ICD-10-CM | POA: Diagnosis not present

## 2020-12-21 DIAGNOSIS — E782 Mixed hyperlipidemia: Secondary | ICD-10-CM | POA: Diagnosis not present

## 2021-01-01 ENCOUNTER — Encounter (HOSPITAL_COMMUNITY): Payer: Self-pay

## 2021-01-01 ENCOUNTER — Other Ambulatory Visit: Payer: Self-pay

## 2021-01-01 ENCOUNTER — Inpatient Hospital Stay (HOSPITAL_COMMUNITY)
Admission: EM | Admit: 2021-01-01 | Discharge: 2021-01-07 | DRG: 853 | Disposition: A | Discharge status: Home / Self Care | Payer: PPO | Attending: Internal Medicine | Admitting: Internal Medicine

## 2021-01-01 DIAGNOSIS — Z23 Encounter for immunization: Secondary | ICD-10-CM

## 2021-01-01 DIAGNOSIS — Z8249 Family history of ischemic heart disease and other diseases of the circulatory system: Secondary | ICD-10-CM

## 2021-01-01 DIAGNOSIS — K449 Diaphragmatic hernia without obstruction or gangrene: Secondary | ICD-10-CM | POA: Diagnosis not present

## 2021-01-01 DIAGNOSIS — R739 Hyperglycemia, unspecified: Secondary | ICD-10-CM | POA: Diagnosis present

## 2021-01-01 DIAGNOSIS — N200 Calculus of kidney: Secondary | ICD-10-CM | POA: Diagnosis present

## 2021-01-01 DIAGNOSIS — E785 Hyperlipidemia, unspecified: Secondary | ICD-10-CM | POA: Diagnosis present

## 2021-01-01 DIAGNOSIS — E872 Acidosis: Secondary | ICD-10-CM | POA: Diagnosis present

## 2021-01-01 DIAGNOSIS — I4891 Unspecified atrial fibrillation: Secondary | ICD-10-CM | POA: Diagnosis present

## 2021-01-01 DIAGNOSIS — I480 Paroxysmal atrial fibrillation: Secondary | ICD-10-CM | POA: Diagnosis present

## 2021-01-01 DIAGNOSIS — A419 Sepsis, unspecified organism: Secondary | ICD-10-CM

## 2021-01-01 DIAGNOSIS — I2510 Atherosclerotic heart disease of native coronary artery without angina pectoris: Secondary | ICD-10-CM | POA: Diagnosis present

## 2021-01-01 DIAGNOSIS — R109 Unspecified abdominal pain: Secondary | ICD-10-CM | POA: Diagnosis not present

## 2021-01-01 DIAGNOSIS — I110 Hypertensive heart disease with heart failure: Secondary | ICD-10-CM | POA: Diagnosis present

## 2021-01-01 DIAGNOSIS — I5033 Acute on chronic diastolic (congestive) heart failure: Secondary | ICD-10-CM | POA: Diagnosis present

## 2021-01-01 DIAGNOSIS — I5032 Chronic diastolic (congestive) heart failure: Secondary | ICD-10-CM | POA: Diagnosis not present

## 2021-01-01 DIAGNOSIS — Z7901 Long term (current) use of anticoagulants: Secondary | ICD-10-CM | POA: Diagnosis not present

## 2021-01-01 DIAGNOSIS — R6520 Severe sepsis without septic shock: Secondary | ICD-10-CM | POA: Diagnosis present

## 2021-01-01 DIAGNOSIS — D696 Thrombocytopenia, unspecified: Secondary | ICD-10-CM | POA: Diagnosis present

## 2021-01-01 DIAGNOSIS — Z20822 Contact with and (suspected) exposure to covid-19: Secondary | ICD-10-CM | POA: Diagnosis present

## 2021-01-01 DIAGNOSIS — R319 Hematuria, unspecified: Secondary | ICD-10-CM | POA: Diagnosis not present

## 2021-01-01 DIAGNOSIS — K5730 Diverticulosis of large intestine without perforation or abscess without bleeding: Secondary | ICD-10-CM | POA: Diagnosis not present

## 2021-01-01 DIAGNOSIS — N201 Calculus of ureter: Secondary | ICD-10-CM | POA: Diagnosis not present

## 2021-01-01 DIAGNOSIS — A4151 Sepsis due to Escherichia coli [E. coli]: Secondary | ICD-10-CM | POA: Diagnosis present

## 2021-01-01 DIAGNOSIS — J90 Pleural effusion, not elsewhere classified: Secondary | ICD-10-CM | POA: Diagnosis not present

## 2021-01-01 DIAGNOSIS — I517 Cardiomegaly: Secondary | ICD-10-CM | POA: Diagnosis not present

## 2021-01-01 DIAGNOSIS — I10 Essential (primary) hypertension: Secondary | ICD-10-CM | POA: Diagnosis not present

## 2021-01-01 DIAGNOSIS — N10 Acute tubulo-interstitial nephritis: Secondary | ICD-10-CM

## 2021-01-01 DIAGNOSIS — E876 Hypokalemia: Secondary | ICD-10-CM | POA: Diagnosis present

## 2021-01-01 DIAGNOSIS — R000 Tachycardia, unspecified: Secondary | ICD-10-CM | POA: Diagnosis not present

## 2021-01-01 DIAGNOSIS — Z95828 Presence of other vascular implants and grafts: Secondary | ICD-10-CM | POA: Diagnosis not present

## 2021-01-01 DIAGNOSIS — K219 Gastro-esophageal reflux disease without esophagitis: Secondary | ICD-10-CM | POA: Diagnosis present

## 2021-01-01 DIAGNOSIS — N281 Cyst of kidney, acquired: Secondary | ICD-10-CM | POA: Diagnosis not present

## 2021-01-01 DIAGNOSIS — I509 Heart failure, unspecified: Secondary | ICD-10-CM | POA: Diagnosis not present

## 2021-01-01 DIAGNOSIS — Z79899 Other long term (current) drug therapy: Secondary | ICD-10-CM | POA: Diagnosis not present

## 2021-01-01 DIAGNOSIS — Z882 Allergy status to sulfonamides status: Secondary | ICD-10-CM | POA: Diagnosis not present

## 2021-01-01 DIAGNOSIS — Z87442 Personal history of urinary calculi: Secondary | ICD-10-CM

## 2021-01-01 DIAGNOSIS — J9601 Acute respiratory failure with hypoxia: Secondary | ICD-10-CM | POA: Diagnosis not present

## 2021-01-01 DIAGNOSIS — J811 Chronic pulmonary edema: Secondary | ICD-10-CM | POA: Diagnosis not present

## 2021-01-01 DIAGNOSIS — N136 Pyonephrosis: Secondary | ICD-10-CM | POA: Diagnosis present

## 2021-01-01 DIAGNOSIS — I712 Thoracic aortic aneurysm, without rupture: Secondary | ICD-10-CM | POA: Diagnosis present

## 2021-01-01 DIAGNOSIS — E782 Mixed hyperlipidemia: Secondary | ICD-10-CM | POA: Diagnosis not present

## 2021-01-01 DIAGNOSIS — R6510 Systemic inflammatory response syndrome (SIRS) of non-infectious origin without acute organ dysfunction: Secondary | ICD-10-CM | POA: Diagnosis not present

## 2021-01-01 DIAGNOSIS — N390 Urinary tract infection, site not specified: Secondary | ICD-10-CM | POA: Diagnosis present

## 2021-01-01 DIAGNOSIS — J9811 Atelectasis: Secondary | ICD-10-CM | POA: Diagnosis not present

## 2021-01-01 DIAGNOSIS — R10A1 Flank pain, right side: Secondary | ICD-10-CM

## 2021-01-01 DIAGNOSIS — N132 Hydronephrosis with renal and ureteral calculous obstruction: Secondary | ICD-10-CM | POA: Diagnosis not present

## 2021-01-01 LAB — URINALYSIS, MICROSCOPIC (REFLEX)

## 2021-01-01 LAB — URINALYSIS, ROUTINE W REFLEX MICROSCOPIC
Bilirubin Urine: NEGATIVE
Glucose, UA: NEGATIVE mg/dL
Ketones, ur: >80 mg/dL — AB
Nitrite: POSITIVE — AB
Protein, ur: 30 mg/dL — AB
Specific Gravity, Urine: 1.025 (ref 1.005–1.030)
pH: 6 (ref 5.0–8.0)

## 2021-01-01 NOTE — ED Triage Notes (Signed)
Pt arrived via POV c/o right side flank pain. Pt reports Hx of renal calculi and this episode of pain began apprx 1500 today and has worsened. Pt endorses an episode of nausea without emesis.

## 2021-01-02 ENCOUNTER — Inpatient Hospital Stay (HOSPITAL_COMMUNITY): Payer: PPO | Admitting: Anesthesiology

## 2021-01-02 ENCOUNTER — Encounter (HOSPITAL_COMMUNITY)
Admission: EM | Disposition: A | Discharge status: Home / Self Care | Payer: Self-pay | Source: Home / Self Care | Attending: Internal Medicine

## 2021-01-02 ENCOUNTER — Encounter (HOSPITAL_COMMUNITY): Payer: Self-pay | Admitting: Internal Medicine

## 2021-01-02 ENCOUNTER — Inpatient Hospital Stay (HOSPITAL_COMMUNITY): Payer: PPO

## 2021-01-02 ENCOUNTER — Inpatient Hospital Stay: Payer: Self-pay

## 2021-01-02 ENCOUNTER — Emergency Department (HOSPITAL_COMMUNITY): Payer: PPO

## 2021-01-02 DIAGNOSIS — R6510 Systemic inflammatory response syndrome (SIRS) of non-infectious origin without acute organ dysfunction: Secondary | ICD-10-CM

## 2021-01-02 DIAGNOSIS — I4891 Unspecified atrial fibrillation: Secondary | ICD-10-CM

## 2021-01-02 DIAGNOSIS — Z7901 Long term (current) use of anticoagulants: Secondary | ICD-10-CM | POA: Diagnosis not present

## 2021-01-02 DIAGNOSIS — R739 Hyperglycemia, unspecified: Secondary | ICD-10-CM

## 2021-01-02 DIAGNOSIS — R6520 Severe sepsis without septic shock: Secondary | ICD-10-CM | POA: Diagnosis not present

## 2021-01-02 DIAGNOSIS — Z882 Allergy status to sulfonamides status: Secondary | ICD-10-CM | POA: Diagnosis not present

## 2021-01-02 DIAGNOSIS — A419 Sepsis, unspecified organism: Secondary | ICD-10-CM

## 2021-01-02 DIAGNOSIS — I110 Hypertensive heart disease with heart failure: Secondary | ICD-10-CM | POA: Diagnosis not present

## 2021-01-02 DIAGNOSIS — N10 Acute tubulo-interstitial nephritis: Secondary | ICD-10-CM

## 2021-01-02 DIAGNOSIS — N390 Urinary tract infection, site not specified: Secondary | ICD-10-CM

## 2021-01-02 DIAGNOSIS — I10 Essential (primary) hypertension: Secondary | ICD-10-CM | POA: Diagnosis not present

## 2021-01-02 DIAGNOSIS — Z8249 Family history of ischemic heart disease and other diseases of the circulatory system: Secondary | ICD-10-CM | POA: Diagnosis not present

## 2021-01-02 DIAGNOSIS — D696 Thrombocytopenia, unspecified: Secondary | ICD-10-CM | POA: Diagnosis not present

## 2021-01-02 DIAGNOSIS — Z23 Encounter for immunization: Secondary | ICD-10-CM | POA: Diagnosis not present

## 2021-01-02 DIAGNOSIS — Z79899 Other long term (current) drug therapy: Secondary | ICD-10-CM | POA: Diagnosis not present

## 2021-01-02 DIAGNOSIS — Z87442 Personal history of urinary calculi: Secondary | ICD-10-CM | POA: Diagnosis not present

## 2021-01-02 DIAGNOSIS — I712 Thoracic aortic aneurysm, without rupture: Secondary | ICD-10-CM | POA: Diagnosis not present

## 2021-01-02 DIAGNOSIS — R109 Unspecified abdominal pain: Secondary | ICD-10-CM

## 2021-01-02 DIAGNOSIS — E872 Acidosis: Secondary | ICD-10-CM | POA: Diagnosis not present

## 2021-01-02 DIAGNOSIS — I480 Paroxysmal atrial fibrillation: Secondary | ICD-10-CM

## 2021-01-02 DIAGNOSIS — E782 Mixed hyperlipidemia: Secondary | ICD-10-CM

## 2021-01-02 DIAGNOSIS — N136 Pyonephrosis: Secondary | ICD-10-CM | POA: Diagnosis not present

## 2021-01-02 DIAGNOSIS — K219 Gastro-esophageal reflux disease without esophagitis: Secondary | ICD-10-CM | POA: Diagnosis not present

## 2021-01-02 DIAGNOSIS — I2510 Atherosclerotic heart disease of native coronary artery without angina pectoris: Secondary | ICD-10-CM | POA: Diagnosis present

## 2021-01-02 DIAGNOSIS — E785 Hyperlipidemia, unspecified: Secondary | ICD-10-CM | POA: Diagnosis present

## 2021-01-02 DIAGNOSIS — N200 Calculus of kidney: Secondary | ICD-10-CM

## 2021-01-02 DIAGNOSIS — N201 Calculus of ureter: Secondary | ICD-10-CM | POA: Diagnosis not present

## 2021-01-02 DIAGNOSIS — R319 Hematuria, unspecified: Secondary | ICD-10-CM

## 2021-01-02 DIAGNOSIS — Z20822 Contact with and (suspected) exposure to covid-19: Secondary | ICD-10-CM | POA: Diagnosis not present

## 2021-01-02 DIAGNOSIS — E876 Hypokalemia: Secondary | ICD-10-CM | POA: Diagnosis present

## 2021-01-02 DIAGNOSIS — A4151 Sepsis due to Escherichia coli [E. coli]: Secondary | ICD-10-CM | POA: Diagnosis not present

## 2021-01-02 DIAGNOSIS — J9601 Acute respiratory failure with hypoxia: Secondary | ICD-10-CM | POA: Diagnosis not present

## 2021-01-02 DIAGNOSIS — I5033 Acute on chronic diastolic (congestive) heart failure: Secondary | ICD-10-CM | POA: Diagnosis not present

## 2021-01-02 HISTORY — PX: CYSTOSCOPY W/ URETERAL STENT PLACEMENT: SHX1429

## 2021-01-02 LAB — CBC WITH DIFFERENTIAL/PLATELET
Abs Immature Granulocytes: 0.04 10*3/uL (ref 0.00–0.07)
Basophils Absolute: 0 10*3/uL (ref 0.0–0.1)
Basophils Relative: 0 %
Eosinophils Absolute: 0 10*3/uL (ref 0.0–0.5)
Eosinophils Relative: 0 %
HCT: 49.4 % — ABNORMAL HIGH (ref 36.0–46.0)
Hemoglobin: 16.4 g/dL — ABNORMAL HIGH (ref 12.0–15.0)
Immature Granulocytes: 0 %
Lymphocytes Relative: 4 %
Lymphs Abs: 0.4 10*3/uL — ABNORMAL LOW (ref 0.7–4.0)
MCH: 31 pg (ref 26.0–34.0)
MCHC: 33.2 g/dL (ref 30.0–36.0)
MCV: 93.4 fL (ref 80.0–100.0)
Monocytes Absolute: 0.3 10*3/uL (ref 0.1–1.0)
Monocytes Relative: 3 %
Neutro Abs: 8.5 10*3/uL — ABNORMAL HIGH (ref 1.7–7.7)
Neutrophils Relative %: 93 %
Platelets: 143 10*3/uL — ABNORMAL LOW (ref 150–400)
RBC: 5.29 MIL/uL — ABNORMAL HIGH (ref 3.87–5.11)
RDW: 12.8 % (ref 11.5–15.5)
WBC: 9.2 10*3/uL (ref 4.0–10.5)
nRBC: 0 % (ref 0.0–0.2)

## 2021-01-02 LAB — MRSA NEXT GEN BY PCR, NASAL: MRSA by PCR Next Gen: NOT DETECTED

## 2021-01-02 LAB — PROTIME-INR
INR: 1.4 — ABNORMAL HIGH (ref 0.8–1.2)
Prothrombin Time: 16.8 seconds — ABNORMAL HIGH (ref 11.4–15.2)

## 2021-01-02 LAB — BASIC METABOLIC PANEL
Anion gap: 15 (ref 5–15)
BUN: 17 mg/dL (ref 8–23)
CO2: 27 mmol/L (ref 22–32)
Calcium: 9.8 mg/dL (ref 8.9–10.3)
Chloride: 95 mmol/L — ABNORMAL LOW (ref 98–111)
Creatinine, Ser: 0.91 mg/dL (ref 0.44–1.00)
GFR, Estimated: 60 mL/min — ABNORMAL LOW (ref >60)
Glucose, Bld: 158 mg/dL — ABNORMAL HIGH (ref 70–99)
Potassium: 3.7 mmol/L (ref 3.5–5.1)
Sodium: 137 mmol/L (ref 135–145)

## 2021-01-02 LAB — PHOSPHORUS: Phosphorus: 2.2 mg/dL — ABNORMAL LOW (ref 2.5–4.6)

## 2021-01-02 LAB — LACTIC ACID, PLASMA
Lactic Acid, Venous: 4.2 mmol/L (ref 0.5–1.9)
Lactic Acid, Venous: 2.1 mmol/L (ref 0.5–1.9)
Lactic Acid, Venous: 3.1 mmol/L (ref 0.5–1.9)

## 2021-01-02 LAB — RESP PANEL BY RT-PCR (FLU A&B, COVID) ARPGX2
Influenza A by PCR: NEGATIVE
Influenza B by PCR: NEGATIVE
SARS Coronavirus 2 by RT PCR: NEGATIVE

## 2021-01-02 LAB — APTT: aPTT: 28 seconds (ref 24–36)

## 2021-01-02 LAB — MAGNESIUM: Magnesium: 1.4 mg/dL — ABNORMAL LOW (ref 1.7–2.4)

## 2021-01-02 SURGERY — CYSTOSCOPY, WITH RETROGRADE PYELOGRAM AND URETERAL STENT INSERTION
Anesthesia: Monitor Anesthesia Care | Site: Ureter | Laterality: Right

## 2021-01-02 MED ORDER — PIPERACILLIN-TAZOBACTAM 3.375 G IVPB
3.3750 g | Freq: Three times a day (TID) | INTRAVENOUS | Status: DC
  Administered 2021-01-02 – 2021-01-04 (×6): 3.375 g via INTRAVENOUS
  Filled 2021-01-02 (×6): qty 50

## 2021-01-02 MED ORDER — ACETAMINOPHEN 650 MG RE SUPP
650.0000 mg | Freq: Once | RECTAL | Status: AC
  Administered 2021-01-02: 650 mg via RECTAL

## 2021-01-02 MED ORDER — LACTATED RINGERS IV BOLUS
1000.0000 mL | Freq: Once | INTRAVENOUS | Status: AC
  Administered 2021-01-02: 1000 mL via INTRAVENOUS

## 2021-01-02 MED ORDER — LACTATED RINGERS IV BOLUS (SEPSIS)
1000.0000 mL | Freq: Once | INTRAVENOUS | Status: AC
  Administered 2021-01-02: 1000 mL via INTRAVENOUS

## 2021-01-02 MED ORDER — SODIUM CHLORIDE 0.9 % IV BOLUS
1000.0000 mL | Freq: Once | INTRAVENOUS | Status: AC
  Administered 2021-01-02: 1000 mL via INTRAVENOUS

## 2021-01-02 MED ORDER — AMIODARONE HCL IN DEXTROSE 360-4.14 MG/200ML-% IV SOLN
60.0000 mg/h | INTRAVENOUS | Status: AC
  Administered 2021-01-02: 60 mg/h via INTRAVENOUS
  Filled 2021-01-02: qty 200

## 2021-01-02 MED ORDER — PHENYLEPHRINE HCL-NACL 20-0.9 MG/250ML-% IV SOLN
0.0000 ug/min | INTRAVENOUS | Status: DC
  Filled 2021-01-02: qty 250

## 2021-01-02 MED ORDER — STERILE WATER FOR IRRIGATION IR SOLN
Status: DC | PRN
  Administered 2021-01-02: 1000 mL

## 2021-01-02 MED ORDER — CHLORHEXIDINE GLUCONATE CLOTH 2 % EX PADS
6.0000 | MEDICATED_PAD | Freq: Every day | CUTANEOUS | Status: DC
  Administered 2021-01-02 – 2021-01-07 (×6): 6 via TOPICAL

## 2021-01-02 MED ORDER — HYDROCORTISONE SOD SUC (PF) 100 MG IJ SOLR
100.0000 mg | Freq: Every day | INTRAMUSCULAR | Status: DC
  Administered 2021-01-02 – 2021-01-03 (×2): 100 mg via INTRAVENOUS
  Filled 2021-01-02 (×2): qty 2

## 2021-01-02 MED ORDER — ACETAMINOPHEN 325 MG PO TABS
650.0000 mg | ORAL_TABLET | Freq: Four times a day (QID) | ORAL | Status: DC | PRN
  Administered 2021-01-02: 650 mg via ORAL
  Filled 2021-01-02: qty 2

## 2021-01-02 MED ORDER — PROPOFOL 500 MG/50ML IV EMUL
INTRAVENOUS | Status: DC | PRN
  Administered 2021-01-02: 50 ug/kg/min via INTRAVENOUS

## 2021-01-02 MED ORDER — PANTOPRAZOLE SODIUM 40 MG PO TBEC
40.0000 mg | DELAYED_RELEASE_TABLET | Freq: Every day | ORAL | Status: DC
Start: 2021-01-02 — End: 2021-01-08
  Administered 2021-01-03 – 2021-01-07 (×5): 40 mg via ORAL
  Filled 2021-01-02 (×6): qty 1

## 2021-01-02 MED ORDER — SODIUM CHLORIDE 0.9 % IR SOLN
Status: DC | PRN
  Administered 2021-01-02: 3000 mL

## 2021-01-02 MED ORDER — MORPHINE SULFATE (PF) 2 MG/ML IV SOLN
2.0000 mg | Freq: Once | INTRAVENOUS | Status: AC
  Administered 2021-01-02: 2 mg via INTRAVENOUS
  Filled 2021-01-02: qty 1

## 2021-01-02 MED ORDER — PROPOFOL 10 MG/ML IV BOLUS
INTRAVENOUS | Status: AC
  Filled 2021-01-02: qty 40

## 2021-01-02 MED ORDER — SODIUM CHLORIDE 0.9% FLUSH: 10.0000 mL | INTRAVENOUS | Status: DC | PRN

## 2021-01-02 MED ORDER — SODIUM CHLORIDE 0.9% FLUSH
10.0000 mL | Freq: Two times a day (BID) | INTRAVENOUS | Status: DC
Start: 2021-01-02 — End: 2021-01-08
  Administered 2021-01-02 – 2021-01-07 (×11): 10 mL

## 2021-01-02 MED ORDER — PROPOFOL 10 MG/ML IV BOLUS
INTRAVENOUS | Status: DC | PRN
  Administered 2021-01-02 (×2): 10 mg via INTRAVENOUS

## 2021-01-02 MED ORDER — SODIUM CHLORIDE 0.9 % IV SOLN: Freq: Once | INTRAVENOUS | Status: AC

## 2021-01-02 MED ORDER — PRAVASTATIN SODIUM 40 MG PO TABS
40.0000 mg | ORAL_TABLET | Freq: Every day | ORAL | Status: DC
  Administered 2021-01-02 – 2021-01-06 (×5): 40 mg via ORAL
  Filled 2021-01-02 (×5): qty 1

## 2021-01-02 MED ORDER — SODIUM CHLORIDE 0.9 % IV SOLN: 1.0000 g | INTRAVENOUS | Status: DC

## 2021-01-02 MED ORDER — DIATRIZOATE MEGLUMINE 30 % UR SOLN
URETHRAL | Status: DC | PRN
  Administered 2021-01-02: 6 mL via URETHRAL

## 2021-01-02 MED ORDER — DIGOXIN 0.25 MG/ML IJ SOLN
0.5000 mg | Freq: Once | INTRAMUSCULAR | Status: AC
  Administered 2021-01-02: 0.5 mg via INTRAVENOUS
  Filled 2021-01-02: qty 2

## 2021-01-02 MED ORDER — SODIUM CHLORIDE 0.9 % IV SOLN: INTRAVENOUS | Status: DC

## 2021-01-02 MED ORDER — LACTATED RINGERS IV BOLUS: 1000.0000 mL | Freq: Once | INTRAVENOUS | Status: DC

## 2021-01-02 MED ORDER — HYDROMORPHONE HCL 1 MG/ML IJ SOLN: 0.5000 mg | INTRAMUSCULAR | Status: DC | PRN

## 2021-01-02 MED ORDER — PHENYLEPHRINE HCL (PRESSORS) 10 MG/ML IV SOLN
INTRAVENOUS | Status: DC | PRN
  Administered 2021-01-02 (×2): 40 ug via INTRAVENOUS
  Administered 2021-01-02 (×2): 80 ug via INTRAVENOUS

## 2021-01-02 MED ORDER — DILTIAZEM HCL ER COATED BEADS 120 MG PO CP24: 120.0000 mg | ORAL_CAPSULE | Freq: Every day | ORAL | Status: DC

## 2021-01-02 MED ORDER — CHLORHEXIDINE GLUCONATE 0.12 % MT SOLN: 15.0000 mL | Freq: Once | OROMUCOSAL | Status: DC

## 2021-01-02 MED ORDER — VANCOMYCIN HCL 750 MG/150ML IV SOLN
750.0000 mg | INTRAVENOUS | Status: DC
  Administered 2021-01-03: 750 mg via INTRAVENOUS
  Filled 2021-01-02 (×2): qty 150

## 2021-01-02 MED ORDER — SODIUM CHLORIDE 0.9 % IV SOLN
2.0000 g | Freq: Once | INTRAVENOUS | Status: AC
  Administered 2021-01-02: 2 g via INTRAVENOUS
  Filled 2021-01-02: qty 20

## 2021-01-02 MED ORDER — LACTATED RINGERS IV SOLN: INTRAVENOUS | Status: DC

## 2021-01-02 MED ORDER — ORAL CARE MOUTH RINSE: 15.0000 mL | Freq: Once | OROMUCOSAL | Status: DC

## 2021-01-02 MED ORDER — AMIODARONE LOAD VIA INFUSION
150.0000 mg | Freq: Once | INTRAVENOUS | Status: AC
  Administered 2021-01-02: 150 mg via INTRAVENOUS
  Filled 2021-01-02: qty 83.34

## 2021-01-02 MED ORDER — LACTATED RINGERS IV SOLN: INTRAVENOUS | Status: AC

## 2021-01-02 MED ORDER — PHENYLEPHRINE 40 MCG/ML (10ML) SYRINGE FOR IV PUSH (FOR BLOOD PRESSURE SUPPORT)
PREFILLED_SYRINGE | INTRAVENOUS | Status: AC
  Filled 2021-01-02: qty 10

## 2021-01-02 MED ORDER — VANCOMYCIN HCL 1250 MG/250ML IV SOLN
1250.0000 mg | Freq: Once | INTRAVENOUS | Status: AC
  Administered 2021-01-02: 1250 mg via INTRAVENOUS
  Filled 2021-01-02: qty 250

## 2021-01-02 MED ORDER — FENTANYL CITRATE PF 50 MCG/ML IJ SOSY: 25.0000 ug | PREFILLED_SYRINGE | INTRAMUSCULAR | Status: DC | PRN

## 2021-01-02 MED ORDER — DIATRIZOATE MEGLUMINE 30 % UR SOLN
URETHRAL | Status: AC
  Filled 2021-01-02: qty 100

## 2021-01-02 MED ORDER — AMIODARONE HCL IN DEXTROSE 360-4.14 MG/200ML-% IV SOLN
30.0000 mg/h | INTRAVENOUS | Status: DC
Start: 2021-01-02 — End: 2021-01-05
  Administered 2021-01-02 – 2021-01-05 (×5): 30 mg/h via INTRAVENOUS
  Filled 2021-01-02 (×6): qty 200

## 2021-01-02 MED ORDER — ONDANSETRON HCL 4 MG/2ML IJ SOLN
4.0000 mg | Freq: Once | INTRAMUSCULAR | Status: AC
  Administered 2021-01-02: 4 mg via INTRAVENOUS
  Filled 2021-01-02: qty 2

## 2021-01-02 MED ORDER — ONDANSETRON HCL 4 MG/2ML IJ SOLN: 4.0000 mg | Freq: Once | INTRAMUSCULAR | Status: DC | PRN

## 2021-01-02 SURGICAL SUPPLY — 25 items
BAG DRAIN URO TABLE W/ADPT NS (BAG) ×2 IMPLANT
BAG DRN 8 ADPR NS SKTRN CSTL (BAG) ×1
BAG DRN RND TRDRP ANRFLXCHMBR (UROLOGICAL SUPPLIES) ×1
BAG HAMPER (MISCELLANEOUS) ×2 IMPLANT
BAG URINE DRAIN 2000ML AR STRL (UROLOGICAL SUPPLIES) ×1 IMPLANT
CATH FOLEY 2WAY SLVR  5CC 16FR (CATHETERS) ×2
CATH FOLEY 2WAY SLVR 5CC 16FR (CATHETERS) IMPLANT
CATH INTERMIT  6FR 70CM (CATHETERS) ×2 IMPLANT
CLOTH BEACON ORANGE TIMEOUT ST (SAFETY) ×2 IMPLANT
DECANTER SPIKE VIAL GLASS SM (MISCELLANEOUS) ×2 IMPLANT
GLOVE SURG POLYISO LF SZ8 (GLOVE) ×2 IMPLANT
GLOVE SURG UNDER POLY LF SZ7 (GLOVE) ×4 IMPLANT
GOWN STRL REUS W/TWL LRG LVL3 (GOWN DISPOSABLE) ×2 IMPLANT
GOWN STRL REUS W/TWL XL LVL3 (GOWN DISPOSABLE) ×2 IMPLANT
GUIDEWIRE STR ZIPWIRE 035X150 (MISCELLANEOUS) ×2 IMPLANT
IV NS IRRIG 3000ML ARTHROMATIC (IV SOLUTION) ×2 IMPLANT
KIT TURNOVER CYSTO (KITS) ×2 IMPLANT
MANIFOLD NEPTUNE II (INSTRUMENTS) ×2 IMPLANT
PACK CYSTO (CUSTOM PROCEDURE TRAY) ×2 IMPLANT
PAD ARMBOARD 7.5X6 YLW CONV (MISCELLANEOUS) ×2 IMPLANT
STENT URET 6FRX24 CONTOUR (STENTS) ×1 IMPLANT
STENT URET 6FRX26 CONTOUR (STENTS) IMPLANT
SYR 10ML LL (SYRINGE) ×2 IMPLANT
TOWEL OR 17X26 4PK STRL BLUE (TOWEL DISPOSABLE) ×2 IMPLANT
WATER STERILE IRR 500ML POUR (IV SOLUTION) ×2 IMPLANT

## 2021-01-02 NOTE — ED Notes (Signed)
CRITICAL VALUE--  Lactic 4.2 reported to primary RN and Hospitalist, Adefeso, at this time.

## 2021-01-02 NOTE — ED Notes (Signed)
Picc placement complete, chest xray ordered to confirm placement

## 2021-01-02 NOTE — ED Notes (Signed)
IV team notified of new orders for picc line placement.

## 2021-01-02 NOTE — Consult Note (Signed)
Cardiology Consultation:   Patient ID: [name]; [id number]; 12/24/1928   Admit date: 01/01/2021 Date of Consult: 01/02/2021  Primary Care Provider: Benita Stabile, MD Primary Cardiologist: New (former [facility] patient) Primary Electrophysiologist: None   Patient Profile:   [name] is a 87 y.o. female with a history of chronic diastolic heart failure, ascending thoracic aortic aneurysm, coronary artery calcification by CT, paroxysmal atrial fibrillation, hypertension, and nephrolithiasis who is being seen today for the evaluation of recurrent atrial fibrillation with RVR at the request of Dr. Arbutus Leas.  History of Present Illness:   Ms. Weis presents to the [facility] ER complaining of recent onset abdominal and right-sided flank pain that began yesterday, subjective fevers and chills, also nausea but no emesis.  She was found to be febrile and in sinus tachycardia initially with work-up concerning for UTI and sepsis with associated pyelonephritis and a ureteral stone.  She went into rapid atrial fibrillation while still holding in the ER and we are consulted to assist with her management.  She has a known history of paroxysmal atrial fibrillation, on Toprol-XL and Cardizem CD at home, also Eliquis for stroke prophylaxis with CHA2DS2-VASc score of 6.  Anticoagulation has been held in anticipation of possible ureteral stent intervention.  She was last evaluated by cardiology via telehealth encounter in June of last year.  Past Medical History:  Diagnosis Date   Anemia    Ascending aortic aneurysm (HCC)    a. 4.6cm by CT 07/2016.   Chronic diastolic CHF (congestive heart failure) (HCC)    Coronary artery calcification seen on CT scan 07/2016   Dysrhythmia    a fib   History of blood transfusion    History of kidney stones    Hypertension    Hypomagnesemia    PAF (paroxysmal atrial fibrillation) (HCC)    a. dx during adm 08/2016.   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    Negative Jak2, BCR/ABL, normal epo level on 02/01/2014   Sepsis (HCC) 08/2016   Thrombocytopenia (HCC)    Trigeminal neuralgia     Past Surgical History:  Procedure Laterality Date   BREAST CYST EXCISION  60 yrs ago   COLONOSCOPY WITH ESOPHAGOGASTRODUODENOSCOPY (EGD) N/A 03/10/2013   Procedure: COLONOSCOPY WITH ESOPHAGOGASTRODUODENOSCOPY (EGD);  Surgeon: Malissa Hippo, MD;  [facility]: [facility];  Service: Endoscopy;  Laterality: N/A;  730   EXTRACORPOREAL SHOCK WAVE LITHOTRIPSY Right 09/20/2016   Procedure: RIGHT EXTRACORPOREAL SHOCK WAVE LITHOTRIPSY (ESWL);  Surgeon: Alfredo Martinez, MD;  Location: [facility];  Service: Urology;  Laterality: Right;   EXTRACORPOREAL SHOCK WAVE LITHOTRIPSY Right 02/16/2019   Procedure: EXTRACORPOREAL SHOCK WAVE LITHOTRIPSY (ESWL);  Surgeon: Marcine Matar, MD;  Location: [facility];  Service: Urology;  Laterality: Right;   EXTRACORPOREAL SHOCK WAVE LITHOTRIPSY Right 04/27/2019   Procedure: EXTRACORPOREAL SHOCK WAVE LITHOTRIPSY (ESWL);  Surgeon: Marcine Matar, MD;  Location: [facility];  Service: Urology;  Laterality: Right;   EYE SURGERY     cataract surgery bilat    Gamma Knife     Trigeminal    SLT LASER APPLICATION Left 09/27/2014   Procedure: SLT LASER APPLICATION;  Surgeon: Susa Simmonds, MD;  Location: [facility];  Service: Ophthalmology;  Laterality: Left;     Inpatient Medications: Scheduled Meds:  amiodarone  150 mg Intravenous Once   pantoprazole  40 mg Oral Daily   pravastatin  40 mg Oral QHS   Continuous Infusions:  amiodarone     Followed by   amiodarone  lactated ringers 150 mL/hr at 01/02/21 0437   piperacillin-tazobactam (ZOSYN)  IV 3.375 g (01/02/21 0826)   PRN Meds: acetaminophen, HYDROmorphone (DILAUDID) injection  Allergies:    Allergies  Allergen Reactions   Sulfa Antibiotics Other (See Comments)    Pt states that this med makes her feel crazy.      Social History:   Social History   Tobacco Use   Smoking  status: Never   Smokeless tobacco: Never  Substance Use Topics   Alcohol use: No     Family History:   The patient's family history includes Congestive Heart Failure in her mother; Heart attack in her father. There is no history of CAD.  ROS:  Please see the history of present illness.  All other ROS reviewed and negative.     Physical Exam/Data:   Vitals:   01/02/21 0515 01/02/21 0538 01/02/21 0700 01/02/21 0810  BP:   101/62 (!) 93/53  Pulse: 98  (!) 107 (!) 107  Resp: (!) 27  (!) 37 (!) 32  Temp:  99.5 F (37.5 C)  (!) 101.4 F (38.6 C)  TempSrc:  Rectal  Rectal  SpO2: 100%  97% 100%  Weight:      Height:        Intake/Output Summary (Last 24 hours) at 01/02/2021 0936 Last data filed at 01/02/2021 0810 Gross per 24 hour  Intake 1269.47 ml  Output --  Net 1269.47 ml   Filed Weights   01/01/21 2245  Weight: 62.5 kg   Body mass index is 24.41 kg/m.   Gen: Elderly woman, no distress. HEENT: Conjunctiva and lids normal, wearing a mask. Neck: Supple, no elevated JVP or carotid bruits, no thyromegaly. Lungs: Clear to auscultation, nonlabored breathing at rest. Cardiac: Irregularly irregular, no S3 or significant systolic murmur, no pericardial rub. Abdomen: Soft, mildly tender without guarding or rebound, bowel sounds present. Extremities: No pitting edema, distal pulses 2+. Skin: Warm and dry. Musculoskeletal: No kyphosis. Neuropsychiatric: Alert and oriented x3, affect grossly appropriate.  EKG:  An ECG dated 01/02/2021 was personally reviewed today and demonstrated:  Atrial fibrillation with RVR.  Telemetry:  I personally reviewed telemetry which shows rapid atrial fibrillation.  Relevant CV Studies:  Echocardiogram 12/22/2018:  1. The left ventricle has hyperdynamic systolic function, with an  ejection fraction of >65%. The cavity size was normal. Left ventricular  diastolic parameters were normal. No evidence of left ventricular regional  wall motion  abnormalities.   2. The right ventricle has normal systolic function. The cavity was  normal. There is no increase in right ventricular wall thickness. Right  ventricular systolic pressure is mildly elevated with an estimated  pressure of 34.7 mmHg.   3. Left atrial size was mildly dilated.   4. The aortic valve is tricuspid. Aortic valve regurgitation is mild by  color flow Doppler. Mild to moderate aortic annular calcification noted.   5. The mitral valve is grossly normal. There is mild mitral annular  calcification present. There is mild mitral regurgitation.   6. The tricuspid valve is grossly normal. There is mild tricuspid  regurgitation.   7. The aorta is normal unless otherwise noted.   Laboratory Data:  Chemistry Recent Labs  Lab 01/02/21 0057  NA 137  K 3.7  CL 95*  CO2 27  GLUCOSE 158*  BUN 17  CREATININE 0.91  CALCIUM 9.8  GFRNONAA 60*  ANIONGAP 15    No results for input(s): PROT, ALBUMIN, AST, ALT, ALKPHOS, BILITOT in the last  168 hours. Hematology Recent Labs  Lab 01/02/21 0057  WBC 9.2  RBC 5.29*  HGB 16.4*  HCT 49.4*  MCV 93.4  MCH 31.0  MCHC 33.2  RDW 12.8  PLT 143*    Radiology/Studies:  CT RENAL STONE STUDY  Result Date: 01/02/2021 CLINICAL DATA:  Flank pain.  Concern for kidney stone. EXAM: CT ABDOMEN AND PELVIS WITHOUT CONTRAST TECHNIQUE: Multidetector CT imaging of the abdomen and pelvis was performed following the standard protocol without IV contrast. COMPARISON:  CT of the abdomen pelvis dated 08/19/2016. FINDINGS: Evaluation of this exam is limited in the absence of intravenous contrast. Lower chest: Minimal bibasilar atelectasis. There is coronary vascular calcification. No intra-abdominal free air or free fluid. Hepatobiliary: The liver is unremarkable. No intrahepatic biliary ductal dilatation. The gallbladder is unremarkable. Pancreas: The pancreas is unremarkable. Spleen: Normal in size without focal abnormality. Adrenals/Urinary  Tract: The adrenal glands unremarkable. There is a 7 mm stone in the distal right ureter with mild right hydronephrosis. There is mild right perinephric stranding. Correlation with urinalysis recommended to exclude UTI. There is a 3 cm left renal cyst. No hydronephrosis or nephrolithiasis on the left the left ureter and urinary bladder appear unremarkable. Stomach/Bowel: Moderate size hiatal hernia. There is sigmoid diverticulosis without active inflammatory changes. There is no bowel obstruction or active inflammation. The appendix is normal. Vascular/Lymphatic: Advanced aortoiliac atherosclerotic disease. The IVC is grossly unremarkable. No portal venous gas. There is no adenopathy. Reproductive: The uterus is anteverted and grossly unremarkable. No adnexal masses. Other: Induration of the subcutaneous soft tissues of the right gluteal region. No fluid collection. Musculoskeletal: Osteopenia with degenerative changes of the spine. Grade 1 L4-L5 anterolisthesis. No acute osseous pathology. IMPRESSION: 1. A 7 mm distal right ureteral stone with mild right hydronephrosis. Correlation with urinalysis recommended to exclude UTI. 2. Sigmoid diverticulosis. No bowel obstruction. Normal appendix. 3. Aortic Atherosclerosis (ICD10-I70.0). Electronically Signed   By: Elgie Collard M.D.   On: 01/02/2021 01:34   [place] EKG SITE RITE  Result Date: 01/02/2021 If Site Rite image not attached, placement could not be confirmed due to current cardiac rhythm.   Assessment and Plan:   1.  Paroxysmal atrial fibrillation with CHA2DS2-VASc score of 6.  At home patient has been relatively stable on combination of Toprol-XL and Cardizem CD, also on Eliquis for stroke prophylaxis.  She now has an episode of recurrent atrial fibrillation with RVR in the setting of sepsis and pyelonephritis.  Recent systolic blood pressures 90s to low 100s.  2.  Sepsis with UTI and pyelonephritis, also ureteral stone with known history of  nephrolithiasis.  Cultures sent and pending.  She is on broad-spectrum antibiotics and awaits Urology consultation regarding possibility for ureteral stent intervention.  Anticoagulation has been held by primary team in anticipation of procedure.  3.  History of chronic diastolic heart failure, LVEF greater than 65% by echocardiogram in 2020.  No evidence of fluid overload at this time.  4.  Ascending thoracic aortic aneurysm managed conservatively and last assessed at 5.2 cm.  Discussed with patient and reviewed with nursing in room.  Will initiate IV amiodarone.  Hopefully this will help her to convert to sinus rhythm and keep rhythm otherwise stable as she undergoes treatment for sepsis and pyelonephritis.  Since anticoagulation has been held, not optimal candidate for electrical cardioversion.  If blood pressure further stabilizes (may need IV fluid boluses in the short-term) can then consider resuming beta-blocker and then calcium channel blocker.  Would not check  follow-up echocardiogram until rhythm is stabilized.  Regarding candidacy for ureteral stent intervention, rhythm needs to be stabilized first if at all possible.  Signed, Nona DellSamuel Madaline Lefeber, MD  01/02/2021 9:36 AM

## 2021-01-02 NOTE — ED Notes (Signed)
Spoke with Cala Bradford RN from [facility], states to notify once picc line is placed. After consultation with anesthesiology and Dr Tat, Dr Thea Silversmith plans to still go ahead with surgery.

## 2021-01-02 NOTE — Progress Notes (Signed)
Dr Johnnette Litter reviewed Chest x-ray.  OK to use PICC line in right arm.

## 2021-01-02 NOTE — ED Notes (Signed)
IV nurse at bedside to place PICC consent obtained. Surgery consent obtained as well.

## 2021-01-02 NOTE — ED Provider Notes (Signed)
[facility] EMERGENCY DEPARTMENT Provider Note   CSN: [id number] Arrival date & time: 01/01/21  2152     History Chief Complaint  Patient presents with   Flank Pain    Right side    [name] is a 87 y.o. female.  Patient presents to the emergency department for evaluation of right-sided abdominal and flank pain.  Pain started this afternoon and has worsened.  Patient reports nausea, no vomiting.  Patient reports a history of kidney stones with similar pain.      Past Medical History:  Diagnosis Date   Anemia    Ascending aortic aneurysm (HCC)    a. 4.6cm by CT 07/2016.   Chronic diastolic CHF (congestive heart failure) (HCC)    Coronary artery calcification seen on CT scan 07/2016   Dysrhythmia    a fib   History of blood transfusion    History of kidney stones    Hypertension    Hypomagnesemia    PAF (paroxysmal atrial fibrillation) (HCC)    a. dx during adm 08/2016.   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   Negative Jak2, BCR/ABL, normal epo level on 02/01/2014   Sepsis (HCC) 08/2016   Thrombocytopenia (HCC)    Trigeminal neuralgia     Patient Active Problem List   Diagnosis Date Noted   GERD (gastroesophageal reflux disease) 03/31/2019   Dysphagia 12/22/2018   Elevated troponin 09/11/2016   PAF (paroxysmal atrial fibrillation) (HCC)    Hypomagnesemia    Hypertension    Chronic diastolic CHF (congestive heart failure) (HCC)    Acute pulmonary edema (HCC)    Sepsis secondary to UTI (HCC) 08/22/2016   Renal calculus, right 08/22/2016   Thrombocytopenia (HCC) 08/22/2016   Acute respiratory failure with hypoxia (HCC) 08/22/2016   Ascending aortic aneurysm (HCC) 08/22/2016   Demand ischemia (HCC) 08/22/2016   Atrial fibrillation with rapid ventricular response (HCC) 08/20/2016   Abnormal liver function    Dyspnea    Coronary artery calcification seen on CT scan 07/22/2016   URI (upper respiratory infection) 04/29/2014   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    Essential hypertension, benign 02/20/2013   High cholesterol 02/20/2013    Past Surgical History:  Procedure Laterality Date   BREAST CYST EXCISION  60 yrs ago   COLONOSCOPY WITH ESOPHAGOGASTRODUODENOSCOPY (EGD) N/A 03/10/2013   Procedure: COLONOSCOPY WITH ESOPHAGOGASTRODUODENOSCOPY (EGD);  Surgeon: Malissa Hippo, MD;  Location: [facility];  Service: Endoscopy;  Laterality: N/A;  730   EXTRACORPOREAL SHOCK WAVE LITHOTRIPSY Right 09/20/2016   Procedure: RIGHT EXTRACORPOREAL SHOCK WAVE LITHOTRIPSY (ESWL);  Surgeon: Alfredo Martinez, MD;  Location: [facility];  Service: Urology;  Laterality: Right;   EXTRACORPOREAL SHOCK WAVE LITHOTRIPSY Right 02/16/2019   Procedure: EXTRACORPOREAL SHOCK WAVE LITHOTRIPSY (ESWL);  Surgeon: Marcine Matar, MD;  Location: [facility];  Service: Urology;  Laterality: Right;   EXTRACORPOREAL SHOCK WAVE LITHOTRIPSY Right 04/27/2019   Procedure: EXTRACORPOREAL SHOCK WAVE LITHOTRIPSY (ESWL);  Surgeon: Marcine Matar, MD;  Location: [facility];  Service: Urology;  Laterality: Right;   EYE SURGERY     cataract surgery bilat    Gamma Knife     Trigeminal    SLT LASER APPLICATION Left 09/27/2014   Procedure: SLT LASER APPLICATION;  Surgeon: Susa Simmonds, MD;  Location: [facility];  Service: Ophthalmology;  Laterality: Left;     OB History   No obstetric history on file.     Family History  Problem Relation Age of Onset   Congestive Heart Failure Mother  Heart attack Father    CAD Neg Hx     Social History   Tobacco Use   Smoking status: Never   Smokeless tobacco: Never  Vaping Use   Vaping Use: Never used  Substance Use Topics   Alcohol use: No   Drug use: No    Home Medications Prior to Admission medications   Medication Sig Start Date End Date Taking? Authorizing Provider  benzonatate (TESSALON) 100 MG capsule Take 1-2 capsules (100-200 mg total) by mouth 3 (three) times daily as needed for cough. 08/27/20   Bing NeighborsHarris, Kimberly S, FNP  Calcium  Carbonate-Vitamin D 600-400 MG-UNIT tablet Take 1 tablet by mouth daily.    [provider]  Cinnamon 500 MG capsule Take 1,000 mg by mouth daily.    [provider]  Coenzyme Q10 (COQ10) 100 MG CAPS Take 200 mg by mouth daily.     [provider]  diltiazem (CARDIZEM CD) 120 MG 24 hr capsule Take 1 capsule (120 mg total) by mouth daily. 10/20/19   Strader, Lowanda FosterBrittany M, PA-C  ELIQUIS 5 MG TABS tablet TAKE (1) TABLET BY MOUTH TWICE DAILY. 08/03/19   Laqueta LindenKoneswaran, Suresh A, MD  esomeprazole (NEXIUM) 40 MG capsule Take 40 mg by mouth daily.  03/31/19   Malissa Hippoehman, Najeeb U, MD  ferrous sulfate 325 (65 FE) MG tablet Take 325 mg by mouth daily.     [provider]  furosemide (LASIX) 20 MG tablet Take 1 tablet (20 mg total) by mouth daily as needed. 10/20/19   Strader, Lennart PallBrittany M, PA-C  gabapentin (NEURONTIN) 300 MG capsule Take 300 mg by mouth at bedtime.     [provider]  metoprolol succinate (TOPROL-XL) 25 MG 24 hr tablet Take 1 tablet (25 mg total) by mouth daily. 10/20/19   Strader, Lennart PallBrittany M, PA-C  Multiple Vitamin (MULTIVITAMIN WITH MINERALS) TABS tablet Take 1 tablet by mouth daily.    [provider]  NON FORMULARY Take 250 mg by mouth daily. Keratin    [provider]  pravastatin (PRAVACHOL) 40 MG tablet Take 40 mg by mouth at bedtime.     [provider]  Specialty Vitamins Products (BIOTIN PLUS KERATIN PO) Take 250 mg by mouth.    [provider]  timolol (TIMOPTIC) 0.5 % ophthalmic solution Place 1 drop into both eyes daily.    [provider]    Allergies    Sulfa antibiotics  Review of Systems   Review of Systems  Gastrointestinal:  Positive for nausea.  Genitourinary:  Positive for flank pain.  All other systems reviewed and are negative.  Physical Exam Updated Vital Signs BP (!) 152/77   Pulse 96   Temp (!) 101.3 F (38.5 C) (Rectal)   Resp 18   Ht 5\' 3"  (1.6 m)   Wt 62.5 kg   SpO2 94%    BMI 24.41 kg/m   Physical Exam Vitals and nursing note reviewed.  Constitutional:      General: She is not in acute distress.    Appearance: Normal appearance. She is well-developed.  HENT:     Head: Normocephalic and atraumatic.     Right Ear: Hearing normal.     Left Ear: Hearing normal.     Nose: Nose normal.  Eyes:     Conjunctiva/sclera: Conjunctivae normal.     Pupils: Pupils are equal, round, and reactive to light.  Cardiovascular:     Rate and Rhythm: Regular rhythm.     Heart sounds: S1  normal and S2 normal. No murmur heard.   No friction rub. No gallop.  Pulmonary:     Effort: Pulmonary effort is normal. No respiratory distress.     Breath sounds: Normal breath sounds.  Chest:     Chest wall: No tenderness.  Abdominal:     General: Bowel sounds are normal.     Palpations: Abdomen is soft.     Tenderness: There is no abdominal tenderness. There is no guarding or rebound. Negative signs include Murphy's sign and McBurney's sign.     Hernia: No hernia is present.  Musculoskeletal:        General: Normal range of motion.     Cervical back: Normal range of motion and neck supple.  Skin:    General: Skin is warm and dry.     Findings: No rash.  Neurological:     Mental Status: She is alert and oriented to person, place, and time.     GCS: GCS eye subscore is 4. GCS verbal subscore is 5. GCS motor subscore is 6.     Cranial Nerves: No cranial nerve deficit.     Sensory: No sensory deficit.     Coordination: Coordination normal.  Psychiatric:        Speech: Speech normal.        Behavior: Behavior normal.        Thought Content: Thought content normal.    ED Results / Procedures / Treatments   Labs (all labs ordered are listed, but only abnormal results are displayed) Labs Reviewed  URINALYSIS, ROUTINE W REFLEX MICROSCOPIC - Abnormal; Notable for the following components:      Result Value   APPearance HAZY (*)    Hgb urine dipstick LARGE (*)    Ketones, ur  >80 (*)    Protein, ur 30 (*)    Nitrite POSITIVE (*)    Leukocytes,Ua SMALL (*)    All other components within normal limits  URINALYSIS, MICROSCOPIC (REFLEX) - Abnormal; Notable for the following components:   Bacteria, UA MANY (*)    All other components within normal limits  CBC WITH DIFFERENTIAL/PLATELET - Abnormal; Notable for the following components:   RBC 5.29 (*)    Hemoglobin 16.4 (*)    HCT 49.4 (*)    Platelets 143 (*)    Neutro Abs 8.5 (*)    Lymphs Abs 0.4 (*)    All other components within normal limits  BASIC METABOLIC PANEL - Abnormal; Notable for the following components:   Chloride 95 (*)    Glucose, Bld 158 (*)    GFR, Estimated 60 (*)    All other components within normal limits  URINE CULTURE  RESP PANEL BY RT-PCR (FLU A&B, COVID) [id number]  CULTURE, BLOOD (SINGLE)  LACTIC ACID, PLASMA    EKG EKG Interpretation  Date/Time:  Monday January 02 2021 04:19:15 EDT Ventricular Rate:  118 PR Interval:  156 QRS Duration: 97 QT Interval:  318 QTC Calculation: 446 R Axis:   86 Text Interpretation: Sinus tachycardia Biatrial enlargement Borderline right axis deviation Low voltage, extremity and precordial leads Confirmed by Gilda Crease [phone]) on 01/02/2021 4:26:03 AM  Radiology CT RENAL STONE STUDY  Result Date: 01/02/2021 CLINICAL DATA:  Flank pain.  Concern for kidney stone. EXAM: CT ABDOMEN AND PELVIS WITHOUT CONTRAST TECHNIQUE: Multidetector CT imaging of the abdomen and pelvis was performed following the standard protocol without IV contrast. COMPARISON:  CT of the abdomen pelvis dated 08/19/2016. FINDINGS: Evaluation of this exam  is limited in the absence of intravenous contrast. Lower chest: Minimal bibasilar atelectasis. There is coronary vascular calcification. No intra-abdominal free air or free fluid. Hepatobiliary: The liver is unremarkable. No intrahepatic biliary ductal dilatation. The gallbladder is unremarkable. Pancreas: The pancreas is  unremarkable. Spleen: Normal in size without focal abnormality. Adrenals/Urinary Tract: The adrenal glands unremarkable. There is a 7 mm stone in the distal right ureter with mild right hydronephrosis. There is mild right perinephric stranding. Correlation with urinalysis recommended to exclude UTI. There is a 3 cm left renal cyst. No hydronephrosis or nephrolithiasis on the left the left ureter and urinary bladder appear unremarkable. Stomach/Bowel: Moderate size hiatal hernia. There is sigmoid diverticulosis without active inflammatory changes. There is no bowel obstruction or active inflammation. The appendix is normal. Vascular/Lymphatic: Advanced aortoiliac atherosclerotic disease. The IVC is grossly unremarkable. No portal venous gas. There is no adenopathy. Reproductive: The uterus is anteverted and grossly unremarkable. No adnexal masses. Other: Induration of the subcutaneous soft tissues of the right gluteal region. No fluid collection. Musculoskeletal: Osteopenia with degenerative changes of the spine. Grade 1 L4-L5 anterolisthesis. No acute osseous pathology. IMPRESSION: 1. A 7 mm distal right ureteral stone with mild right hydronephrosis. Correlation with urinalysis recommended to exclude UTI. 2. Sigmoid diverticulosis. No bowel obstruction. Normal appendix. 3. Aortic Atherosclerosis (ICD10-I70.0). Electronically Signed   By: Elgie Collard M.D.   On: 01/02/2021 01:34    Procedures Procedures   Medications Ordered in ED Medications  lactated ringers infusion (has no administration in time range)  lactated ringers bolus 1,000 mL (has no administration in time range)  morphine 2 MG/ML injection 2 mg (2 mg Intravenous Given 01/02/21 0111)  ondansetron (ZOFRAN) injection 4 mg (4 mg Intravenous Given 01/02/21 0108)  cefTRIAXone (ROCEPHIN) 2 g in sodium chloride 0.9 % 100 mL IVPB (2 g Intravenous New Bag/Given 01/02/21 0317)  acetaminophen (TYLENOL) suppository 650 mg (650 mg Rectal Given 01/02/21  0418)    ED Course  I have reviewed the triage vital signs and the nursing notes.  Pertinent labs & imaging results that were available during my care of the patient were reviewed by me and considered in my medical decision making (see chart for details).    MDM Rules/Calculators/A&P                           Patient presents with right-sided flank pain.  Patient reports she had some pain initially in the back that improved and then began to have more severe pain in the right groin area.  This is similar to kidney stones that she has had in the past.  Blood work was unremarkable.  Urinalysis did show positive nitrite with white cells and many bacteria.  She was treated with Rocephin.  CT scan was performed and does show 7 mm distal ureteral stone.  Discussed with Dr. Arita Miss, on-call for urology.  As the patient did not have a fever or elevated white blood cell count, it was felt that she could be treated with antibiotics and prompt outpatient follow-up.  Patient was monitored in the department.  She became tachycardic and was exhibiting rigors.  A recheck of her temperature revealed that her temperature was now 101.3.  Dr. Arita Miss contacted again.  Recommends medicine admission, Dr. Ronne Binning can see patient this morning to arrange for ureteral stent.  CRITICAL CARE Performed by: Gilda Crease   Total critical care time: 32 minutes  Critical care time was exclusive  of separately billable procedures and treating other patients.  Critical care was necessary to treat or prevent imminent or life-threatening deterioration.  Critical care was time spent personally by me on the following activities: development of treatment plan with patient and/or surrogate as well as nursing, discussions with consultants, evaluation of patient's response to treatment, examination of patient, obtaining history from patient or surrogate, ordering and performing treatments and interventions, ordering and review  of laboratory studies, ordering and review of radiographic studies, pulse oximetry and re-evaluation of patient's condition.  Final Clinical Impression(s) / ED Diagnoses Final diagnoses:  Kidney stone  Urinary tract infection with hematuria, site unspecified    Rx / DC Orders ED Discharge Orders     None        Shyasia Funches, Canary Brim, MD 01/02/21 [phone]

## 2021-01-02 NOTE — H&P (View-Only) (Signed)
Urology Consult  Referring physician: Dr. Arbutus Leas Reason for referral: right ureteral calculus, sepsis  Chief Complaint: right flank pain  History of Present Illness: [name] is a 87yo with a history of nephrolithiasis who presented to the ER with a 3-4 day history of worsening right flank pain. She has associated nausea but no vomiting. She has associated urinary frequency, urgency and dysuria. CT stone study shows a 7mm right mid ureteral calculus with moderate hydronephrosis and perinephric stranding. UA is concerning for infection. Lactate 4.2 She has a fever of 101.4. No other associated symptoms. No other exacerbating/alleviating events  Past Medical History:  Diagnosis Date   Anemia    Ascending aortic aneurysm (HCC)    a. 4.6cm by CT 07/2016.   Chronic diastolic CHF (congestive heart failure) (HCC)    Coronary artery calcification seen on CT scan 07/2016   Dysrhythmia    a fib   History of blood transfusion    History of kidney stones    Hypertension    Hypomagnesemia    PAF (paroxysmal atrial fibrillation) (HCC)    a. dx during adm 08/2016.   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   Negative Jak2, BCR/ABL, normal epo level on 02/01/2014   Sepsis (HCC) 08/2016   Thrombocytopenia (HCC)    Trigeminal neuralgia    Past Surgical History:  Procedure Laterality Date   BREAST CYST EXCISION  60 yrs ago   COLONOSCOPY WITH ESOPHAGOGASTRODUODENOSCOPY (EGD) N/A 03/10/2013   Procedure: COLONOSCOPY WITH ESOPHAGOGASTRODUODENOSCOPY (EGD);  Surgeon: Malissa Hippo, MD;  [facility];  Service: Endoscopy;  Laterality: N/A;  730   EXTRACORPOREAL SHOCK WAVE LITHOTRIPSY Right 09/20/2016   Procedure: RIGHT EXTRACORPOREAL SHOCK WAVE LITHOTRIPSY (ESWL);  Surgeon: Alfredo Martinez, MD;  Location: [facility];  Service: Urology;  Laterality: Right;   EXTRACORPOREAL SHOCK WAVE LITHOTRIPSY Right 02/16/2019   Procedure: EXTRACORPOREAL SHOCK WAVE LITHOTRIPSY (ESWL);  Surgeon: Marcine Matar, MD;   Location: [facility];  Service: Urology;  Laterality: Right;   EXTRACORPOREAL SHOCK WAVE LITHOTRIPSY Right 04/27/2019   Procedure: EXTRACORPOREAL SHOCK WAVE LITHOTRIPSY (ESWL);  Surgeon: Marcine Matar, MD;  Location: [facility];  Service: Urology;  Laterality: Right;   EYE SURGERY     cataract surgery bilat    Gamma Knife     Trigeminal    SLT LASER APPLICATION Left 09/27/2014   Procedure: SLT LASER APPLICATION;  Surgeon: Susa Simmonds, MD;  Location: [facility];  Service: Ophthalmology;  Laterality: Left;    Medications: I have reviewed the patient's current medications. Allergies:  Allergies  Allergen Reactions   Sulfa Antibiotics Other (See Comments)    Pt states that this med makes her feel crazy.      Family History  Problem Relation Age of Onset   Congestive Heart Failure Mother    Heart attack Father    CAD Neg Hx    Social History:  reports that she has never smoked. She has never used smokeless tobacco. She reports that she does not drink alcohol and does not use drugs.  Review of Systems  Constitutional:  Positive for chills and fever.  Gastrointestinal:  Positive for nausea.  Genitourinary:  Positive for dysuria and flank pain.  All other systems reviewed and are negative.  Physical Exam:  Vital signs in last 24 hours: Temp:  [97.5 F (36.4 C)-101.4 F (38.6 C)] 98.8 F (37.1 C) (09/12 0952) Pulse Rate:  [50-164] 127 (09/12 1215) Resp:  [18-40] 26 (09/12 1215) BP: (82-170)/(53-105) 94/65 (09/12 1215) SpO2:  [91 %-100 %]  92 % (09/12 1215) Weight:  [62.5 kg] 62.5 kg (09/11 2245) Physical Exam Constitutional:      Appearance: Normal appearance.  HENT:     Head: Normocephalic and atraumatic.     Nose: Nose normal. No congestion.  Eyes:     Extraocular Movements: Extraocular movements intact.     Pupils: Pupils are equal, round, and reactive to light.  Cardiovascular:     Rate and Rhythm: Normal rate and regular rhythm.  Pulmonary:     Effort: Pulmonary effort is  normal. No respiratory distress.  Abdominal:     General: Abdomen is flat. There is no distension.  Musculoskeletal:        General: Normal range of motion.     Cervical back: Normal range of motion and neck supple.  Skin:    General: Skin is warm and dry.  Neurological:     General: No focal deficit present.     Mental Status: She is alert and oriented to person, place, and time.  Psychiatric:        Mood and Affect: Mood normal.        Behavior: Behavior normal.        Thought Content: Thought content normal.        Judgment: Judgment normal.    Laboratory Data:  Results for orders placed or performed during the hospital encounter of 01/01/21 (from the past 72 hour(s))  Urinalysis, Routine w reflex microscopic Urine, Clean Catch     Status: Abnormal   Collection Time: 01/01/21 11:00 PM  Result Value Ref Range   Color, Urine YELLOW YELLOW   APPearance HAZY (A) CLEAR   Specific Gravity, Urine 1.025 1.005 - 1.030   pH 6.0 5.0 - 8.0   Glucose, UA NEGATIVE NEGATIVE mg/dL   Hgb urine dipstick LARGE (A) NEGATIVE   Bilirubin Urine NEGATIVE NEGATIVE   Ketones, ur >80 (A) NEGATIVE mg/dL   Protein, ur 30 (A) NEGATIVE mg/dL   Nitrite POSITIVE (A) NEGATIVE   Leukocytes,Ua SMALL (A) NEGATIVE    Comment: Performed at [facility], [address] [place], [place] [address]  Urinalysis, Microscopic (reflex)     Status: Abnormal   Collection Time: 01/01/21 11:00 PM  Result Value Ref Range   RBC / HPF 21-50 0 - 5 RBC/hpf   WBC, UA 11-20 0 - 5 WBC/hpf   Bacteria, UA MANY (A) NONE SEEN   Squamous Epithelial / LPF 11-20 0 - 5    Comment: Performed at [facility], [address] [place], [place] [address]  CBC with Differential/Platelet     Status: Abnormal   Collection Time: 01/02/21 12:57 AM  Result Value Ref Range   WBC 9.2 4.0 - 10.5 K/uL   RBC 5.29 (H) 3.87 - 5.11 MIL/uL   Hemoglobin 16.4 (H) 12.0 - 15.0 g/dL   HCT 09.849.4 (H) 11.936.0 - 14.746.0 %   MCV 93.4 80.0 - 100.0 fL   MCH 31.0 26.0  - 34.0 pg   MCHC 33.2 30.0 - 36.0 g/dL   RDW 82.912.8 56.211.5 - 13.015.5 %   Platelets 143 (L) 150 - 400 K/uL   nRBC 0.0 0.0 - 0.2 %   Neutrophils Relative % 93 %   Neutro Abs 8.5 (H) 1.7 - 7.7 K/uL   Lymphocytes Relative 4 %   Lymphs Abs 0.4 (L) 0.7 - 4.0 K/uL   Monocytes Relative 3 %   Monocytes Absolute 0.3 0.1 - 1.0 K/uL   Eosinophils Relative 0 %   Eosinophils  Absolute 0.0 0.0 - 0.5 K/uL   Basophils Relative 0 %   Basophils Absolute 0.0 0.0 - 0.1 K/uL   Immature Granulocytes 0 %   Abs Immature Granulocytes 0.04 0.00 - 0.07 K/uL    Comment: Performed at [facility], [address], [place], [place] [address]  Basic metabolic panel     Status: Abnormal   Collection Time: 01/02/21 12:57 AM  Result Value Ref Range   Sodium 137 135 - 145 mmol/L   Potassium 3.7 3.5 - 5.1 mmol/L   Chloride 95 (L) 98 - 111 mmol/L   CO2 27 22 - 32 mmol/L   Glucose, Bld 158 (H) 70 - 99 mg/dL    Comment: Glucose reference range applies only to samples taken after fasting for at least 8 hours.   BUN 17 8 - 23 mg/dL   Creatinine, Ser 3.32 0.44 - 1.00 mg/dL   Calcium 9.8 8.9 - 95.1 mg/dL   GFR, Estimated 60 (L) >60 mL/min    Comment: (NOTE) Calculated using the CKD-EPI Creatinine Equation (2021)    Anion gap 15 5 - 15    Comment: Performed at [facility], [address], [place], [place] [address]  Resp Panel by RT-PCR (Flu A&B, Covid) Nasopharyngeal Swab     Status: None   Collection Time: 01/02/21  4:17 AM   Specimen: Nasopharyngeal Swab; Nasopharyngeal(NP) swabs in vial transport medium  Result Value Ref Range   SARS Coronavirus 2 by RT PCR NEGATIVE NEGATIVE    Comment: (NOTE) SARS-CoV-2 target nucleic acids are NOT DETECTED.  The SARS-CoV-2 RNA is generally detectable in upper respiratory specimens during the acute phase of infection. The lowest concentration of SARS-CoV-2 viral copies this assay can detect is 138 copies/mL. A negative result does not preclude SARS-Cov-2 infection and should not be  used as the sole basis for treatment or other patient management decisions. A negative result may occur with  improper specimen collection/handling, submission of specimen other than nasopharyngeal swab, presence of viral mutation(s) within the areas targeted by this assay, and inadequate number of viral copies(<138 copies/mL). A negative result must be combined with clinical observations, patient history, and epidemiological information. The expected result is Negative.  Fact Sheet for Patients:  BloggerCourse.com  Fact Sheet for Healthcare Providers:  SeriousBroker.it  This test is no t yet approved or cleared by the [place] FDA and  has been authorized for detection and/or diagnosis of SARS-CoV-2 by FDA under an Emergency Use Authorization (EUA). This EUA will remain  in effect (meaning this test can be used) for the duration of the COVID-19 declaration under Section 564(b)(1) of the Act, 21 U.S.C.section 360bbb-3(b)(1), unless the authorization is terminated  or revoked sooner.       Influenza A by PCR NEGATIVE NEGATIVE   Influenza B by PCR NEGATIVE NEGATIVE    Comment: (NOTE) The Xpert Xpress SARS-CoV-2/FLU/RSV plus assay is intended as an aid in the diagnosis of influenza from Nasopharyngeal swab specimens and should not be used as a sole basis for treatment. Nasal washings and aspirates are unacceptable for Xpert Xpress SARS-CoV-2/FLU/RSV testing.  Fact Sheet for Patients: BloggerCourse.com  Fact Sheet for Healthcare Providers: SeriousBroker.it  This test is not yet approved or cleared by the [place] FDA and has been authorized for detection and/or diagnosis of SARS-CoV-2 by FDA under an Emergency Use Authorization (EUA). This EUA will remain in effect (meaning this test can be used) for the duration of the COVID-19 declaration under Section 564(b)(1) of the  Act, 21 U.S.C. section 360bbb-3(b)(1), unless the authorization is terminated or revoked.  Performed at [facility], [address] [place], [place] [address]   Culture, blood (single)     Status: None (Preliminary result)   Collection Time: 01/02/21  4:46 AM   Specimen: Vein; Blood  Result Value Ref Range   Specimen Description LEFT ANTECUBITAL    Special Requests      BOTTLES DRAWN AEROBIC AND ANAEROBIC Blood Culture adequate volume   Culture      NO GROWTH < 12 HOURS Performed at [facility], [address] [place], [place] [address]    Report Status PENDING   Lactic acid, plasma     Status: Abnormal   Collection Time: 01/02/21  4:46 AM  Result Value Ref Range   Lactic Acid, Venous 4.2 (HH) 0.5 - 1.9 mmol/L    Comment: CRITICAL RESULT CALLED TO, READ BACK BY AND VERIFIED WITH: KAYLA NICHOLS @ 0536 ON 01/02/21 Riesa Pope Performed at [facility], [address], [place], [place] [address]   Lactic acid, plasma     Status: Abnormal   Collection Time: 01/02/21  8:00 AM  Result Value Ref Range   Lactic Acid, Venous 2.1 (HH) 0.5 - 1.9 mmol/L    Comment: CRITICAL VALUE NOTED.  VALUE IS CONSISTENT WITH PREVIOUSLY REPORTED AND CALLED VALUE. Performed at [facility], [address], [place], [place] [address]   Lactic acid, plasma     Status: Abnormal   Collection Time: 01/02/21  8:48 AM  Result Value Ref Range   Lactic Acid, Venous 3.1 (HH) 0.5 - 1.9 mmol/L    Comment: CRITICAL VALUE NOTED.  VALUE IS CONSISTENT WITH PREVIOUSLY REPORTED AND CALLED VALUE. Performed at [facility], [address], [place], [place] [address]   Protime-INR     Status: Abnormal   Collection Time: 01/02/21  8:48 AM  Result Value Ref Range   Prothrombin Time 16.8 (H) 11.4 - 15.2 seconds   INR 1.4 (H) 0.8 - 1.2    Comment: (NOTE) INR goal varies based on device and disease states. Performed at [facility], [address] [place], [place] [address]   APTT     Status: None   Collection Time:  01/02/21  8:48 AM  Result Value Ref Range   aPTT 28 24 - 36 seconds    Comment: Performed at [facility], [address], [place], [place] [address]  Magnesium     Status: Abnormal   Collection Time: 01/02/21  8:48 AM  Result Value Ref Range   Magnesium 1.4 (L) 1.7 - 2.4 mg/dL    Comment: Performed at [facility], [address], [place], [place] [address]  Phosphorus     Status: Abnormal   Collection Time: 01/02/21  8:48 AM  Result Value Ref Range   Phosphorus 2.2 (L) 2.5 - 4.6 mg/dL    Comment: Performed at [facility], [address], [place], [place] [address]   Recent Results (from the past 240 hour(s))  Resp Panel by RT-PCR (Flu A&B, Covid) Nasopharyngeal Swab     Status: None   Collection Time: 01/02/21  4:17 AM   Specimen: Nasopharyngeal Swab; Nasopharyngeal(NP) swabs in vial transport medium  Result Value Ref Range Status   SARS Coronavirus 2 by RT PCR NEGATIVE NEGATIVE Final    Comment: (NOTE) SARS-CoV-2 target nucleic acids are NOT DETECTED.  The SARS-CoV-2 RNA is generally detectable in upper respiratory specimens during the acute phase of infection. The lowest concentration of SARS-CoV-2  viral copies this assay can detect is 138 copies/mL. A negative result does not preclude SARS-Cov-2 infection and should not be used as the sole basis for treatment or other patient management decisions. A negative result may occur with  improper specimen collection/handling, submission of specimen other than nasopharyngeal swab, presence of viral mutation(s) within the areas targeted by this assay, and inadequate number of viral copies(<138 copies/mL). A negative result must be combined with clinical observations, patient history, and epidemiological information. The expected result is Negative.  Fact Sheet for Patients:  BloggerCourse.com  Fact Sheet for Healthcare Providers:  SeriousBroker.it  This test is no t yet approved or  cleared by the [place] FDA and  has been authorized for detection and/or diagnosis of SARS-CoV-2 by FDA under an Emergency Use Authorization (EUA). This EUA will remain  in effect (meaning this test can be used) for the duration of the COVID-19 declaration under Section 564(b)(1) of the Act, 21 U.S.C.section 360bbb-3(b)(1), unless the authorization is terminated  or revoked sooner.       Influenza A by PCR NEGATIVE NEGATIVE Final   Influenza B by PCR NEGATIVE NEGATIVE Final    Comment: (NOTE) The Xpert Xpress SARS-CoV-2/FLU/RSV plus assay is intended as an aid in the diagnosis of influenza from Nasopharyngeal swab specimens and should not be used as a sole basis for treatment. Nasal washings and aspirates are unacceptable for Xpert Xpress SARS-CoV-2/FLU/RSV testing.  Fact Sheet for Patients: BloggerCourse.com  Fact Sheet for Healthcare Providers: SeriousBroker.it  This test is not yet approved or cleared by the [place] FDA and has been authorized for detection and/or diagnosis of SARS-CoV-2 by FDA under an Emergency Use Authorization (EUA). This EUA will remain in effect (meaning this test can be used) for the duration of the COVID-19 declaration under Section 564(b)(1) of the Act, 21 U.S.C. section 360bbb-3(b)(1), unless the authorization is terminated or revoked.  Performed at [facility], [address], [place], [place] [address]   Culture, blood (single)     Status: None (Preliminary result)   Collection Time: 01/02/21  4:46 AM   Specimen: Vein; Blood  Result Value Ref Range Status   Specimen Description LEFT ANTECUBITAL  Final   Special Requests   Final    BOTTLES DRAWN AEROBIC AND ANAEROBIC Blood Culture adequate volume   Culture   Final    NO GROWTH < 12 HOURS Performed at [facility], [address], [place], [place] [address]    Report Status PENDING  Incomplete   Creatinine: Recent Labs     01/02/21 0057  CREATININE 0.91   Baseline Creatinine: 0.9  Impression/Assessment:  87yo with right ureteral calculus, sepsis  Plan:  -We discussed the management of kidney stones. These options include observation, ureteroscopy, shockwave lithotripsy (ESWL) and percutaneous nephrolithotomy (PCNL). We discussed which options are relevant to the patient's stone(s). We discussed the natural history of kidney stones as well as the complications of untreated stones and the impact on quality of life without treatment as well as with each of the above listed treatments. We also discussed the efficacy of each treatment in its ability to clear the stone burden. With any of these management options I discussed the signs and symptoms of infection and the need for emergent treatment should these be experienced. For each option we discussed the ability of each procedure to clear the patient of their stone burden.   For observation I described the risks which include but are not limited to silent renal damage,  life-threatening infection, need for emergent surgery, failure to pass stone and pain.   For ureteroscopy I described the risks which include bleeding, infection, damage to contiguous structures, positioning injury, ureteral stricture, ureteral avulsion, ureteral injury, need for prolonged ureteral stent, inability to perform ureteroscopy, need for an interval procedure, inability to clear stone burden, stent discomfort/pain, heart attack, stroke, pulmonary embolus and the inherent risks with general anesthesia.   For shockwave lithotripsy I described the risks which include arrhythmia, kidney contusion, kidney hemorrhage, need for transfusion, pain, inability to adequately break up stone, inability to pass stone fragments, Steinstrasse, infection associated with obstructing stones, need for alternate surgical procedure, need for repeat shockwave lithotripsy, MI, CVA, PE and the inherent risks with  anesthesia/conscious sedation.   For PCNL I described the risks including positioning injury, pneumothorax, hydrothorax, need for chest tube, inability to clear stone burden, renal laceration, arterial venous fistula or malformation, need for embolization of kidney, loss of kidney or renal function, need for repeat procedure, need for prolonged nephrostomy tube, ureteral avulsion, MI, CVA, PE and the inherent risks of general anesthesia.   - The patient would like to proceed with right ureteral stent placement  Wilkie Aye 01/02/2021, 12:33 PM

## 2021-01-02 NOTE — ED Notes (Signed)
Pharmacy to bring up phenylephrine to bedside

## 2021-01-02 NOTE — Anesthesia Preprocedure Evaluation (Addendum)
Anesthesia Evaluation  Patient identified by MRN, date of birth, ID band Patient awake    Reviewed: Allergy & Precautions, H&P , NPO status , Patient's Chart, lab work & pertinent test results, reviewed documented beta blocker date and time   Airway Mallampati: II  TM Distance: >3 FB Neck ROM: full    Dental no notable dental hx.    Pulmonary shortness of breath,    Pulmonary exam normal breath sounds clear to auscultation       Cardiovascular Exercise Tolerance: Good hypertension, + CAD and +CHF  + dysrhythmias Atrial Fibrillation  Rhythm:regular Rate:Normal     Neuro/Psych  Neuromuscular disease negative psych ROS   GI/Hepatic Neg liver ROS, GERD  Medicated,  Endo/Other  negative endocrine ROS  Renal/GU ARFRenal disease  negative genitourinary   Musculoskeletal   Abdominal   Peds  Hematology  (+) Blood dyscrasia, anemia ,   Anesthesia Other Findings   Reproductive/Obstetrics negative OB ROS                             Anesthesia Physical Anesthesia Plan  ASA: 4 and emergent  Anesthesia Plan: MAC   Post-op Pain Management:    Induction:   PONV Risk Score and Plan: Ondansetron and TIVA  Airway Management Planned:   Additional Equipment:   Intra-op Plan:   Post-operative Plan:   Informed Consent: I have reviewed the patients History and Physical, chart, labs and discussed the procedure including the risks, benefits and alternatives for the proposed anesthesia with the patient or authorized representative who has indicated his/her understanding and acceptance.     Dental Advisory Given  Plan Discussed with: CRNA  Anesthesia Plan Comments:        Anesthesia Quick Evaluation

## 2021-01-02 NOTE — Anesthesia Postprocedure Evaluation (Signed)
Anesthesia Post Note  Patient: Jarrett Soho  Procedure(s) Performed: CYSTOSCOPY WITH RETROGRADE PYELOGRAM/URETERAL STENT PLACEMENT (Right: Ureter)  Patient location during evaluation: Phase II Anesthesia Type: MAC Level of consciousness: awake Pain management: pain level controlled Vital Signs Assessment: post-procedure vital signs reviewed and stable Respiratory status: spontaneous breathing and respiratory function stable Cardiovascular status: blood pressure returned to baseline and stable Postop Assessment: no headache and no apparent nausea or vomiting Anesthetic complications: no Comments: Late entry   No notable events documented.   Last Vitals:  Vitals:   01/02/21 1445 01/02/21 1500  BP: 103/65 103/63  Pulse: 71 74  Resp: (!) 22 20  Temp:    SpO2: 100% 92%    Last Pain:  Vitals:   01/02/21 0952  TempSrc: Oral  PainSc:                  Louann Sjogren

## 2021-01-02 NOTE — Transfer of Care (Signed)
Immediate Anesthesia Transfer of Care Note  Patient: [name]  Procedure(s) Performed: CYSTOSCOPY WITH RETROGRADE PYELOGRAM/URETERAL STENT PLACEMENT (Right: Ureter)  Patient Location: PACU  Anesthesia Type:MAC  Level of Consciousness: awake and alert   Airway & Oxygen Therapy: Patient Spontanous Breathing and Patient connected to face mask oxygen  Post-op Assessment: Report given to RN and Post -op Vital signs reviewed and stable  Post vital signs: Reviewed and stable  Last Vitals:  Vitals Value Taken Time  BP 141/92 01/02/21  1431  Temp    Pulse 98 01/02/21 1431  Resp 19 01/02/21 1431  SpO2 93 % 01/02/21 1431  Vitals shown include unvalidated device data.  Last Pain:  Vitals:   01/02/21 0952  TempSrc: Oral  PainSc:          Complications: No notable events documented.

## 2021-01-02 NOTE — Progress Notes (Signed)
PROGRESS NOTE  [name] YFV:[id number] DOB: 1929/01/09 DOA: 01/01/2021 PCP: Benita Stabile, MD  Brief History:  87 year old female with a history of paroxysmal atrial fibrillation, diastolic CHF, hypertension, hyperlipidemia, coronary arterydisease, ascending aortic aneurysm, and nephrolithiasis status post lithotripsy May 2018 presenting with right-sided abdominal pain/right flank pain that began around 1 PM on 01/01/2021.  The patient had been in her usual state of health prior to the beginning of her episodes.  She has some nausea without emesis.  She has some subjective fevers and chills.  She denies any headache, neck pain, chest pain, shortness breath, coughing, hemoptysis, diarrhea, hematochezia, melena. In the emergency department, the patient was febrile up to 101.3 F with tachycardia 120.  She was hemodynamically stable with oxygen saturation 100% on room air.  BMP shows sodium 137, potassium 3.7, serum creatinine of 0.91.  WBC 9.2, hemoglobin 16.4, platelets143K.  Urology was consulted, and there are plans for Dr. Ronne Binning to see the patient for possible ureteral stent placement.  Assessment/Plan: Severe Sepsis -due to UTI -Patient presented with fever, tachycardia, and elevated lactic acid -Continue IV fluids -Continue ceftriaxone -lactate peaked 4.6  Pyelonephritis/ureteral stone -Urology consulted -01/01/21 CT renal--7 mm distal right ureteral stone with right perinephric stranding and right hydronephrosis -Continue IV ceftriaxone -IV fluids  Thrombocytopenia -This has been chronic in the past dating back to April 2018 -Monitor for signs of bleeding  Paroxysmal atrial fibrillation -Currently in sinus rhythm -Holding apixaban in preparation for surgery -Resume metoprolol succinate and Cardizem CD post surgery  Chronic diastolic CHF -Appears clinically euvolemic -12/22/2018 echo EF >65%; mild MR, no WMA  Coronary artery disease -No chest pain -Personally  reviewed EKG--- sinus rhythm, no concerning ST-T wave change  Ascending aortic aneurysm -Measuring 5.2 cm on last imaging -Patient follows Dr. Emi Holes to be a poor surgical candidate  Hyperlipidemia -Continue statin      Status is: Inpatient  Remains inpatient appropriate because:Inpatient level of care appropriate due to severity of illness  Dispo: The patient is from: Home              Anticipated d/c is to: Home              Patient currently is not medically stable to d/c.   Difficult to place patient No        Family Communication:   no Family at bedside  Consultants:  urology  Code Status:  FULL   DVT Prophylaxis:  apixaban   Procedures: As Listed in Progress Note Above  Antibiotics: Ceftriaxone 9/11>>      Subjective:  Patient complains of right flank pain.  She has some nausea but denies denies any emesis.  She has some subjective fevers and chills.  She denies any chest pain, shortness of breath, coughing, hemoptysis, diarrhea, headache, neck pain. Objective: Vitals:   01/02/21 0430 01/02/21 0500 01/02/21 0515 01/02/21 0538  BP: 136/80 124/62    Pulse: (!) 120  98   Resp: (!) 28  (!) 27   Temp:    99.5 F (37.5 C)  TempSrc:    Rectal  SpO2: 91%  100%   Weight:      Height:        Intake/Output Summary (Last 24 hours) at 01/02/2021 0708 Last data filed at 01/02/2021 0610 Gross per 24 hour  Intake 1098.22 ml  Output --  Net 1098.22 ml   Weight change:  Exam:  General:  Pt  is alert, follows commands appropriately, not in acute distress HEENT: No icterus, No thrush, No neck mass, [place]/AT Cardiovascular: RRR, S1/S2, no rubs, no gallops Respiratory: Bibasal crackles, R>L Abdomen: Soft/+BS, Rabd tender, non distended, no guarding Extremities: Nonpitting edema, No lymphangitis, No petechiae, No rashes, no synovitis   Data Reviewed: I have personally reviewed following labs and imaging studies Basic Metabolic Panel: Recent  Labs  Lab 01/02/21 0057  NA 137  K 3.7  CL 95*  CO2 27  GLUCOSE 158*  BUN 17  CREATININE 0.91  CALCIUM 9.8   Liver Function Tests: No results for input(s): AST, ALT, ALKPHOS, BILITOT, PROT, ALBUMIN in the last 168 hours. No results for input(s): LIPASE, AMYLASE in the last 168 hours. No results for input(s): AMMONIA in the last 168 hours. Coagulation Profile: No results for input(s): INR, PROTIME in the last 168 hours. CBC: Recent Labs  Lab 01/02/21 0057  WBC 9.2  NEUTROABS 8.5*  HGB 16.4*  HCT 49.4*  MCV 93.4  PLT 143*   Cardiac Enzymes: No results for input(s): CKTOTAL, CKMB, CKMBINDEX, TROPONINI in the last 168 hours. BNP: Invalid input(s): POCBNP CBG: No results for input(s): GLUCAP in the last 168 hours. HbA1C: No results for input(s): HGBA1C in the last 72 hours. Urine analysis:    Component Value Date/Time   COLORURINE YELLOW 01/01/2021 2300   APPEARANCEUR HAZY (A) 01/01/2021 2300   APPEARANCEUR Hazy (A) 11/15/2020 0944   LABSPEC 1.025 01/01/2021 2300   PHURINE 6.0 01/01/2021 2300   GLUCOSEU NEGATIVE 01/01/2021 2300   HGBUR LARGE (A) 01/01/2021 2300   BILIRUBINUR NEGATIVE 01/01/2021 2300   BILIRUBINUR Negative 11/15/2020 0944   KETONESUR >80 (A) 01/01/2021 2300   PROTEINUR 30 (A) 01/01/2021 2300   UROBILINOGEN negative (A) 05/19/2019 0844   NITRITE POSITIVE (A) 01/01/2021 2300   LEUKOCYTESUR SMALL (A) 01/01/2021 2300   Sepsis Labs: @LABRCNTIP (procalcitonin:4,lacticidven:4) ) Recent Results (from the past 240 hour(s))  Resp Panel by RT-PCR (Flu A&B, Covid) Nasopharyngeal Swab     Status: None   Collection Time: 01/02/21  4:17 AM   Specimen: Nasopharyngeal Swab; Nasopharyngeal(NP) swabs in vial transport medium  Result Value Ref Range Status   SARS Coronavirus 2 by RT PCR NEGATIVE NEGATIVE Final    Comment: (NOTE) SARS-CoV-2 target nucleic acids are NOT DETECTED.  The SARS-CoV-2 RNA is generally detectable in upper respiratory specimens during  the acute phase of infection. The lowest concentration of SARS-CoV-2 viral copies this assay can detect is 138 copies/mL. A negative result does not preclude SARS-Cov-2 infection and should not be used as the sole basis for treatment or other patient management decisions. A negative result may occur with  improper specimen collection/handling, submission of specimen other than nasopharyngeal swab, presence of viral mutation(s) within the areas targeted by this assay, and inadequate number of viral copies(<138 copies/mL). A negative result must be combined with clinical observations, patient history, and epidemiological information. The expected result is Negative.  Fact Sheet for Patients:  03/04/21  Fact Sheet for Healthcare Providers:  BloggerCourse.com  This test is no t yet approved or cleared by the [place] FDA and  has been authorized for detection and/or diagnosis of SARS-CoV-2 by FDA under an Emergency Use Authorization (EUA). This EUA will remain  in effect (meaning this test can be used) for the duration of the COVID-19 declaration under Section 564(b)(1) of the Act, 21 U.S.C.section 360bbb-3(b)(1), unless the authorization is terminated  or revoked sooner.       Influenza A by  PCR NEGATIVE NEGATIVE Final   Influenza B by PCR NEGATIVE NEGATIVE Final    Comment: (NOTE) The Xpert Xpress SARS-CoV-2/FLU/RSV plus assay is intended as an aid in the diagnosis of influenza from Nasopharyngeal swab specimens and should not be used as a sole basis for treatment. Nasal washings and aspirates are unacceptable for Xpert Xpress SARS-CoV-2/FLU/RSV testing.  Fact Sheet for Patients: BloggerCourse.com  Fact Sheet for Healthcare Providers: [employer]ious[employer]ker.it  This test is not yet approved or cleared by the [place] FDA and has been authorized for detection and/or  diagnosis of SARS-CoV-2 by FDA under an Emergency Use Authorization (EUA). This EUA will remain in effect (meaning this test can be used) for the duration of the COVID-19 declaration under Section 564(b)(1) of the Act, 21 U.S.C. section 360bbb-3(b)(1), unless the authorization is terminated or revoked.  Performed at [facility], [address] [place], [place] [address]      Scheduled Meds: Continuous Infusions:  [START ON 01/03/2021] cefTRIAXone (ROCEPHIN)  IV     lactated ringers 150 mL/hr at 01/02/21 0437    Procedures/Studies: CT RENAL STONE STUDY  Result Date: 01/02/2021 CLINICAL DATA:  Flank pain.  Concern for kidney stone. EXAM: CT ABDOMEN AND PELVIS WITHOUT CONTRAST TECHNIQUE: Multidetector CT imaging of the abdomen and pelvis was performed following the standard protocol without IV contrast. COMPARISON:  CT of the abdomen pelvis dated 08/19/2016. FINDINGS: Evaluation of this exam is limited in the absence of intravenous contrast. Lower chest: Minimal bibasilar atelectasis. There is coronary vascular calcification. No intra-abdominal free air or free fluid. Hepatobiliary: The liver is unremarkable. No intrahepatic biliary ductal dilatation. The gallbladder is unremarkable. Pancreas: The pancreas is unremarkable. Spleen: Normal in size without focal abnormality. Adrenals/Urinary Tract: The adrenal glands unremarkable. There is a 7 mm stone in the distal right ureter with mild right hydronephrosis. There is mild right perinephric stranding. Correlation with urinalysis recommended to exclude UTI. There is a 3 cm left renal cyst. No hydronephrosis or nephrolithiasis on the left the left ureter and urinary bladder appear unremarkable. Stomach/Bowel: Moderate size hiatal hernia. There is sigmoid diverticulosis without active inflammatory changes. There is no bowel obstruction or active inflammation. The appendix is normal. Vascular/Lymphatic: Advanced aortoiliac atherosclerotic disease. The IVC  is grossly unremarkable. No portal venous gas. There is no adenopathy. Reproductive: The uterus is anteverted and grossly unremarkable. No adnexal masses. Other: Induration of the subcutaneous soft tissues of the right gluteal region. No fluid collection. Musculoskeletal: Osteopenia with degenerative changes of the spine. Grade 1 L4-L5 anterolisthesis. No acute osseous pathology. IMPRESSION: 1. A 7 mm distal right ureteral stone with mild right hydronephrosis. Correlation with urinalysis recommended to exclude UTI. 2. Sigmoid diverticulosis. No bowel obstruction. Normal appendix. 3. Aortic Atherosclerosis (ICD10-I70.0). Electronically Signed   By: Elgie Collard M.D.   On: 01/02/2021 01:34    Catarina Hartshorn, DO  [facility]  If 7PM-7AM, please contact night-coverage [email or web address] Password [email or web address] 01/02/2021, 7:08 AM   LOS: 0 days

## 2021-01-02 NOTE — ED Notes (Signed)
2nd lactic acid obtained

## 2021-01-02 NOTE — ED Notes (Signed)
Pt noted with HR 150-175. New EKG obtained and EDP notified. Dr. Arbutus Leas attending notified at well. Pt denies chest pain but does feel heart beating fast. BP remains 95/74 despite fluid bolus. Pt placed in trendelenburg position. Dr. Arbutus Leas in route

## 2021-01-02 NOTE — ED Notes (Signed)
Dr. Arbutus Leas notified of pt bp and HR. No new orders at this time

## 2021-01-02 NOTE — Progress Notes (Signed)
Notified bedside nurse of need to draw repeat lactic acid. 

## 2021-01-02 NOTE — Sepsis Progress Note (Signed)
Following for sepsis monitoring ?

## 2021-01-02 NOTE — Progress Notes (Signed)
Responded to nursing call:  low BP, afib RVR   Subjective: Came to bedside to evaluate patient.  She denies cp, sob, n/v.  Abdominal pain is controlled  Vitals:   01/02/21 0515 01/02/21 0538 01/02/21 0700 01/02/21 0810  BP:   101/62 (!) 93/53  Pulse: 98  (!) 107 (!) 107  Resp: (!) 27  (!) 37 (!) 32  Temp:  99.5 F (37.5 C)  (!) 101.4 F (38.6 C)  TempSrc:  Rectal  Rectal  SpO2: 100%  97% 100%  Weight:      Height:       CV--IRRR Lung--bibasilar crackles, R>L Abd--soft+BS/R-flank tender to palp   Assessment/Plan: Afib with RVR -due to soft/low BPs-->digoxin x 1 -start amio load and drip -change admit to ICU     Catarina Hartshorn, DO [facility]

## 2021-01-02 NOTE — ED Notes (Signed)
Son at bedside. Updated on status and plan for pt. Pt continues to be alert and oriented. LR stopped at this time per Dr. Arbutus Leas. NS bolus in progress d/t lack of peripheral lines.  IV team to come place picc line. Attending aware

## 2021-01-02 NOTE — H&P (Signed)
History and Physical  [name] CBJ:[id number] DOB: [birth year] DOA: 01/01/2021  Referring physician:  Orpah Greek, MD PCP: Celene Squibb, MD  Patient coming from: Home  Chief Complaint: Right flank pain  HPI: [name] is a 87 y.o. female with medical history significant for GERD, chronic atrial fibrillation, hypertension, hyperlipidemia, chronic diastolic CHF and history of nephrolithiasis who presents to the emergency department due to 1 day of right-sided abdominal flank pain.  Abdominal pain radiates to the groin and was associated with nausea without vomiting.  She states the right flank pain was similar to pain sustained with prior kidney stones.  She denies chest pain, shortness of breath, fever or headache.  ED Course:  In the emergency department, she became febrile after arrival to the emergency department with a temperature of 101.22F, BP was soft with systolic 160 and other vital signs are within normal range.  Work-up in the ED showed thrombocytopenia, hyperglycemia, urinalysis was positive for nitrite, hematocrit and small leukocytes. CT abdomen and pelvis without contrast showed a 7 mm distal right ureteral stone with mild right hydronephrosis. Patient was treated with Tylenol Zofran and morphine, IV ceftriaxone was started, IV hydration was provided.  Urology was consulted and recommended admitting patient here at [facility] with plan to be seen in the morning by Dr. Alyson Ingles (urology) for possible ureteral stent placement.  Hospitalist was asked to admit patient for further evaluation and management.  Review of Systems: Constitutional: Negative for chills and fever.  HENT: Negative for ear pain and sore throat.   Eyes: Negative for pain and visual disturbance.  Respiratory: Negative for cough, chest tightness and shortness of breath.   Cardiovascular: Negative for chest pain and palpitations.  Gastrointestinal: Positive for nausea negative for abdominal pain and  vomiting.  Endocrine: Negative for polyphagia and polyuria.  Genitourinary: Positive for right flank pain (resolved).  Negative for decreased urine volume, enuresis Musculoskeletal: Negative for arthralgias and back pain.  Skin: Negative for color change and rash.  Allergic/Immunologic: Negative for immunocompromised state.  Neurological: Negative for tremors, syncope, speech difficulty, weakness, light-headedness and headaches.  Hematological: Does not bruise/bleed easily.  All other systems reviewed and are negative   Past Medical History:  Diagnosis Date   Anemia    Ascending aortic aneurysm ([place])    a. 4.6cm by CT 07/2016.   Chronic diastolic CHF (congestive heart failure) (HCC)    Coronary artery calcification seen on CT scan 07/2016   Dysrhythmia    a fib   History of blood transfusion    History of kidney stones    Hypertension    Hypomagnesemia    PAF (paroxysmal atrial fibrillation) ([place])    a. dx during adm 08/2016.   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   Negative Jak2, BCR/ABL, normal epo level on 02/01/2014   Sepsis ([place]) 08/2016   Thrombocytopenia ([place])    Trigeminal neuralgia    Past Surgical History:  Procedure Laterality Date   BREAST CYST EXCISION  60 yrs ago   COLONOSCOPY WITH ESOPHAGOGASTRODUODENOSCOPY (EGD) N/A 03/10/2013   Procedure: COLONOSCOPY WITH ESOPHAGOGASTRODUODENOSCOPY (EGD);  Surgeon: Rogene Houston, MD;  Location: [facility];  Service: Endoscopy;  Laterality: N/A;  730   EXTRACORPOREAL SHOCK WAVE LITHOTRIPSY Right 09/20/2016   Procedure: RIGHT EXTRACORPOREAL SHOCK WAVE LITHOTRIPSY (ESWL);  Surgeon: Bjorn Loser, MD;  Location: [facility];  Service: Urology;  Laterality: Right;   EXTRACORPOREAL SHOCK WAVE LITHOTRIPSY Right 02/16/2019   Procedure: EXTRACORPOREAL SHOCK WAVE LITHOTRIPSY (ESWL);  Surgeon: Diona Fanti,  Annie Main, MD;  Location: [facility];  Service: Urology;  Laterality: Right;   EXTRACORPOREAL SHOCK WAVE LITHOTRIPSY Right 04/27/2019    Procedure: EXTRACORPOREAL SHOCK WAVE LITHOTRIPSY (ESWL);  Surgeon: Franchot Gallo, MD;  Location: [facility];  Service: Urology;  Laterality: Right;   EYE SURGERY     cataract surgery bilat    Gamma Knife     Trigeminal    SLT LASER APPLICATION Left 0/12/6043   Procedure: SLT LASER APPLICATION;  Surgeon: Williams Che, MD;  Location: [facility];  Service: Ophthalmology;  Laterality: Left;    Social History:  reports that she has never smoked. She has never used smokeless tobacco. She reports that she does not drink alcohol and does not use drugs.   Allergies  Allergen Reactions   Sulfa Antibiotics Other (See Comments)    Pt states that this med makes her feel crazy.      Family History  Problem Relation Age of Onset   Congestive Heart Failure Mother    Heart attack Father    CAD Neg Hx      Prior to Admission medications   Medication Sig Start Date End Date Taking? Authorizing Provider  benzonatate (TESSALON) 100 MG capsule Take 1-2 capsules (100-200 mg total) by mouth 3 (three) times daily as needed for cough. 08/27/20   Scot Jun, FNP  Calcium Carbonate-Vitamin D 600-400 MG-UNIT tablet Take 1 tablet by mouth daily.    [provider]  Cinnamon 500 MG capsule Take 1,000 mg by mouth daily.    [provider]  Coenzyme Q10 (COQ10) 100 MG CAPS Take 200 mg by mouth daily.     [provider]  diltiazem (CARDIZEM CD) 120 MG 24 hr capsule Take 1 capsule (120 mg total) by mouth daily. 10/20/19   Strader, Marye Round M, PA-C  ELIQUIS 5 MG TABS tablet TAKE (1) TABLET BY MOUTH TWICE DAILY. 08/03/19   Herminio Commons, MD  esomeprazole (NEXIUM) 40 MG capsule Take 40 mg by mouth daily.  03/31/19   Rogene Houston, MD  ferrous sulfate 325 (65 FE) MG tablet Take 325 mg by mouth daily.     [provider]  furosemide (LASIX) 20 MG tablet Take 1 tablet (20 mg total) by mouth daily as needed. 10/20/19   Strader, Fransisco Hertz, PA-C  gabapentin (NEURONTIN) 300  MG capsule Take 300 mg by mouth at bedtime.     [provider]  metoprolol succinate (TOPROL-XL) 25 MG 24 hr tablet Take 1 tablet (25 mg total) by mouth daily. 10/20/19   Strader, Fransisco Hertz, PA-C  Multiple Vitamin (MULTIVITAMIN WITH MINERALS) TABS tablet Take 1 tablet by mouth daily.    [provider]  NON FORMULARY Take 250 mg by mouth daily. Keratin    [provider]  pravastatin (PRAVACHOL) 40 MG tablet Take 40 mg by mouth at bedtime.     [provider]  Specialty Vitamins Products (BIOTIN PLUS KERATIN PO) Take 250 mg by mouth.    [provider]  timolol (TIMOPTIC) 0.5 % ophthalmic solution Place 1 drop into both eyes daily.    [provider]    Physical Exam: BP 124/62   Pulse 98   Temp 99.5 F (37.5 C) (Rectal)   Resp (!) 27   Ht $R'5\' 3"'Cz$  (1.6 m)   Wt 62.5 kg   SpO2 100%   BMI 24.41 kg/m   General: 87 y.o. year-old female well developed well nourished in no acute distress.  Alert  and oriented x3. HEENT: NCAT, EOMI Neck: Supple, trachea medial Cardiovascular: Tachycardia.  Regular rate and rhythm with no rubs or gallops.  No thyromegaly or JVD noted.  No lower extremity edema. 2/4 pulses in all 4 extremities. Respiratory: Clear to auscultation with no wheezes or rales. Good inspiratory effort. Abdomen: Soft, nontender nondistended with normal bowel sounds x4 quadrants. Muskuloskeletal: No cyanosis, clubbing or edema noted bilaterally Neuro: CN II-XII intact, strength 5/5 x 4, sensation, reflexes intact Skin: No ulcerative lesions noted or rashes Psychiatry: Judgement and insight appear normal. Mood is appropriate for condition and setting          Labs on Admission:  Basic Metabolic Panel: Recent Labs  Lab 01/02/21 0057  NA 137  K 3.7  CL 95*  CO2 27  GLUCOSE 158*  BUN 17  CREATININE 0.91  CALCIUM 9.8   Liver Function Tests: No results for input(s): AST, ALT, ALKPHOS, BILITOT, PROT, ALBUMIN in the last 168  hours. No results for input(s): LIPASE, AMYLASE in the last 168 hours. No results for input(s): AMMONIA in the last 168 hours. CBC: Recent Labs  Lab 01/02/21 0057  WBC 9.2  NEUTROABS 8.5*  HGB 16.4*  HCT 49.4*  MCV 93.4  PLT 143*   Cardiac Enzymes: No results for input(s): CKTOTAL, CKMB, CKMBINDEX, TROPONINI in the last 168 hours.  BNP (last 3 results) No results for input(s): BNP in the last 8760 hours.  ProBNP (last 3 results) No results for input(s): PROBNP in the last 8760 hours.  CBG: No results for input(s): GLUCAP in the last 168 hours.  Radiological Exams on Admission: CT RENAL STONE STUDY  Result Date: 01/02/2021 CLINICAL DATA:  Flank pain.  Concern for kidney stone. EXAM: CT ABDOMEN AND PELVIS WITHOUT CONTRAST TECHNIQUE: Multidetector CT imaging of the abdomen and pelvis was performed following the standard protocol without IV contrast. COMPARISON:  CT of the abdomen pelvis dated 08/19/2016. FINDINGS: Evaluation of this exam is limited in the absence of intravenous contrast. Lower chest: Minimal bibasilar atelectasis. There is coronary vascular calcification. No intra-abdominal free air or free fluid. Hepatobiliary: The liver is unremarkable. No intrahepatic biliary ductal dilatation. The gallbladder is unremarkable. Pancreas: The pancreas is unremarkable. Spleen: Normal in size without focal abnormality. Adrenals/Urinary Tract: The adrenal glands unremarkable. There is a 7 mm stone in the distal right ureter with mild right hydronephrosis. There is mild right perinephric stranding. Correlation with urinalysis recommended to exclude UTI. There is a 3 cm left renal cyst. No hydronephrosis or nephrolithiasis on the left the left ureter and urinary bladder appear unremarkable. Stomach/Bowel: Moderate size hiatal hernia. There is sigmoid diverticulosis without active inflammatory changes. There is no bowel obstruction or active inflammation. The appendix is normal.  Vascular/Lymphatic: Advanced aortoiliac atherosclerotic disease. The IVC is grossly unremarkable. No portal venous gas. There is no adenopathy. Reproductive: The uterus is anteverted and grossly unremarkable. No adnexal masses. Other: Induration of the subcutaneous soft tissues of the right gluteal region. No fluid collection. Musculoskeletal: Osteopenia with degenerative changes of the spine. Grade 1 L4-L5 anterolisthesis. No acute osseous pathology. IMPRESSION: 1. A 7 mm distal right ureteral stone with mild right hydronephrosis. Correlation with urinalysis recommended to exclude UTI. 2. Sigmoid diverticulosis. No bowel obstruction. Normal appendix. 3. Aortic Atherosclerosis (ICD10-I70.0). Electronically Signed   By: Anner Crete M.D.   On: 01/02/2021 01:34    EKG: I independently viewed the EKG done and my findings are as followed: Sinus tachycardia at a rate of 118 bpm  Assessment/Plan  Present on Admission:  UTI (urinary tract infection)  Thrombocytopenia (HCC)  GERD (gastroesophageal reflux disease)  Atrial fibrillation with rapid ventricular response (HCC)  Essential hypertension, benign  Active Problems:   Essential hypertension, benign   Hyperlipidemia   Atrial fibrillation with rapid ventricular response (HCC)   Renal calculus, right   Thrombocytopenia (HCC)   GERD (gastroesophageal reflux disease)   UTI (urinary tract infection)   Hyperglycemia   Right flank pain   SIRS (systemic inflammatory response syndrome) (HCC)  Right flank pain and right-sided nephrolithiasis CT abdomen and pelvis without contrast showed a 7 mm distal right ureteral stone with mild right hydronephrosis. Continue IV Dilaudid 0.5 mg every 4 hours as needed Continue IV hydration [facility] was [facility] and recommended admitting patient to [facility] with plan to be seen in the morning by Dr. Alyson Ingles per ED physician.  SIRS secondary to UTI POA Patient met SIRS criteria due to being febrile and  tachypneic.  Presumed UTI as source of infection.  Lactic acid of 4.2 Patient was febrile, urinalysis was positive for nitrites but with only small leukocytes Patient was empirically started on IV ceftriaxone, we shall continue with same at this time Blood culture and urine culture pending  Lactic acidosis possibly secondary to sepsis due to UTI Lactic acid 4.2, continue to trend lactic acid  ??  Respiratory failure with hypoxia Patient was reported to be hypoxic when she suddenly started shivering shortly after being treated with Tylenol due to fever. Continue to monitor patient and wean off supplemental oxygen as tolerated  Thrombocytopenia and possibly reactive Platelets 143, continue to monitor platelet levels with morning labs  Hyperglycemia possibly reactive CBG 158, patient has no history of T2DM Continue to monitor blood glucose level with morning labs  GERD Continue Protonix  Chronic atrial fibrillation CHA2DS2-VASc score = 3 Continue Cardizem Eliquis will be held at this time due to possible urological procedure in the morning  Essential hypertension Continue Cardizem  Hyperlipidemia Continue Pravachol  DVT prophylaxis: SCDs  Code Status: Full code  Family Communication: None at bedside  Disposition Plan:  Patient is from:                        home Anticipated DC to:                   SNF or family members home Anticipated DC date:               2-3 days Anticipated DC barriers:        Patient requires inpatient management due to right-sided nephrolithiasis with pending urology consult and possible surgical intervention  Consults called: Urology  Admission status: Inpatient    Bernadette Hoit MD [facility]  01/02/2021, 6:31 AM

## 2021-01-02 NOTE — Progress Notes (Signed)
Pharmacy Antibiotic Note  [name] is a 87 y.o. female admitted on 01/01/2021 with sepsis.  Pharmacy has been consulted for Vancomycin dosing.  Plan: Vancomycin 1250mg  IV loading dose, then 750mg  IV q24h for AUC 485 Also on zosyn 3.375gm IV q8h EID over 4 hours F/U cxs and clinical progress Monitor V/S, labs, and levels as indicated  Height: 5\' 3"  (160 cm) Weight: 62.5 kg (137 lb 12.6 oz) IBW/kg (Calculated) : 52.4  Temp (24hrs), Avg:99.7 F (37.6 C), Min:97.5 F (36.4 C), Max:101.4 F (38.6 C)  Recent Labs  Lab 01/02/21 0057 01/02/21 0446 01/02/21 0800 01/02/21 0848  WBC 9.2  --   --   --   CREATININE 0.91  --   --   --   LATICACIDVEN  --  4.2* 2.1* 3.1*    Estimated Creatinine Clearance: 34 mL/min (by C-G formula based on SCr of 0.91 mg/dL).    Allergies  Allergen Reactions   Sulfa Antibiotics Other (See Comments)    Pt states that this med makes her feel crazy.      Antimicrobials this admission: Zosyn 9/12 >>  Vancomycin  9/12 >>  Ceftriaxone 9/12> 9/12  Microbiology results: 9/11 UCX: pending 9/12 BCX: pending  Thank you for allowing pharmacy to be a part of this patient's care.  11/12 01/02/2021 11:17 AM

## 2021-01-02 NOTE — Progress Notes (Signed)
Peripherally Inserted Central Catheter Placement  The IV Nurse has discussed with the patient and/or persons authorized to consent for the patient, the purpose of this procedure and the potential benefits and risks involved with this procedure.  The benefits include less needle sticks, lab draws from the catheter, and the patient may be discharged home with the catheter. Risks include, but not limited to, infection, bleeding, blood clot (thrombus formation), and puncture of an artery; nerve damage and irregular heartbeat and possibility to perform a PICC exchange if needed/ordered by physician.  Alternatives to this procedure were also discussed.  Bard Power PICC patient education guide, fact sheet on infection prevention and patient information card has been provided to patient /or left at bedside.    PICC Placement Documentation  PICC Double Lumen 01/02/21 PICC Right Brachial 36 cm 0 cm (Active)  Indication for Insertion or Continuance of Line Vasoactive infusions 01/02/21 1213  Exposed Catheter (cm) 0 cm 01/02/21 1213  Site Assessment Clean;Dry;Intact 01/02/21 1213  Lumen #1 Status Flushed;Saline locked;Blood return noted 01/02/21 1213  Lumen #2 Status Flushed;Saline locked;Blood return noted 01/02/21 1213  Dressing Type Transparent;Securing device 01/02/21 1213  Dressing Status Clean;Dry;Intact 01/02/21 1213  Antimicrobial disc in place? Yes 01/02/21 1213  Safety Lock Not Applicable 01/02/21 1213  Line Adjustment (NICU/IV Team Only) No 01/02/21 1213  Dressing Intervention New dressing 01/02/21 1213  Dressing Change Due 01/09/21 01/02/21 1213       Annett Fabian 01/02/2021, 12:14 PM

## 2021-01-02 NOTE — ED Notes (Signed)
Pt placed on 4 lpm to keep sats at 90

## 2021-01-02 NOTE — Op Note (Signed)
.  Preoperative diagnosis: right ureteral stone, sepsis  Postoperative diagnosis: Same  Procedure: 1 cystoscopy 2. right retrograde pyelography 3.  Intraoperative fluoroscopy, under one hour, with interpretation 4.  right 6 x 26 JJ stent placement  Attending: Wilkie Aye  Anesthesia: General  Estimated blood loss: None  Drains: Right 6 x 24 JJ ureteral stent without tether, 16 French foley catheter  Specimens: none  Antibiotics: rocephin  Findings:right mid ureteral stone. Moderate hydronephrosis. No masses/lesions in the bladder. Ureteral orifices in normal anatomic location.  Indications: Patient is a 87 year old female with a history of right ureteral stone and concern for sepsis.  After discussing treatment options, they decided proceed with right stent placement.  Procedure in detail: The patient was brought to the operating room and a brief timeout was done to ensure correct patient, correct procedure, correct site.  General anesthesia was administered patient was placed in dorsal lithotomy position.  Their genitalia was then prepped and draped in usual sterile fashion.  A rigid 22 French cystoscope was passed in the urethra and the bladder.  Bladder was inspected free masses or lesions.  the ureteral orifices were in the normal orthotopic locations.  a 6 french ureteral catheter was then instilled into the right ureteral orifice.  a gentle retrograde was obtained and findings noted above.  we then placed a zip wire through the ureteral catheter and advanced up to the renal pelvis.    We then placed a 6 x 24 double-j ureteral stent over the original zip wire.  We then removed the wire and good coil was noted in the the renal pelvis under fluoroscopy and the bladder under direct vision.  A foley catheter was then placed. the bladder was then drained and this concluded the procedure which was well tolerated by patient.  Complications: None  Condition: Stable, extubated,  transferred to PACU  Plan: Patient is to be admitted for IV antibiotics. She will have her stone extraction in 2 weeks.

## 2021-01-02 NOTE — Consult Note (Signed)
Urology Consult  Referring physician: Dr. Tat Reason for referral: right ureteral calculus, sepsis  Chief Complaint: right flank pain  History of Present Illness: Ms Hackman is a 87yo with a history of nephrolithiasis who presented to the ER with a 3-4 day history of worsening right flank pain. She has associated nausea but no vomiting. She has associated urinary frequency, urgency and dysuria. CT stone study shows a 7mm right mid ureteral calculus with moderate hydronephrosis and perinephric stranding. UA is concerning for infection. Lactate 4.2 She has a fever of 101.4. No other associated symptoms. No other exacerbating/alleviating events  Past Medical History:  Diagnosis Date   Anemia    Ascending aortic aneurysm (HCC)    a. 4.6cm by CT 07/2016.   Chronic diastolic CHF (congestive heart failure) (HCC)    Coronary artery calcification seen on CT scan 07/2016   Dysrhythmia    a fib   History of blood transfusion    History of kidney stones    Hypertension    Hypomagnesemia    PAF (paroxysmal atrial fibrillation) (HCC)    a. dx during adm 08/2016.   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   Negative Jak2, BCR/ABL, normal epo level on 02/01/2014   Sepsis (HCC) 08/2016   Thrombocytopenia (HCC)    Trigeminal neuralgia    Past Surgical History:  Procedure Laterality Date   BREAST CYST EXCISION  60 yrs ago   COLONOSCOPY WITH ESOPHAGOGASTRODUODENOSCOPY (EGD) N/A 03/10/2013   Procedure: COLONOSCOPY WITH ESOPHAGOGASTRODUODENOSCOPY (EGD);  Surgeon: Najeeb U Rehman, MD;  [facility]: [facility];  Service: Endoscopy;  Laterality: N/A;  730   EXTRACORPOREAL SHOCK WAVE LITHOTRIPSY Right 09/20/2016   Procedure: RIGHT EXTRACORPOREAL SHOCK WAVE LITHOTRIPSY (ESWL);  Surgeon: MacDiarmid, Scott, MD;  Location: [facility];  Service: Urology;  Laterality: Right;   EXTRACORPOREAL SHOCK WAVE LITHOTRIPSY Right 02/16/2019   Procedure: EXTRACORPOREAL SHOCK WAVE LITHOTRIPSY (ESWL);  Surgeon: Dahlstedt, Stephen, MD;   Location: [facility];  Service: Urology;  Laterality: Right;   EXTRACORPOREAL SHOCK WAVE LITHOTRIPSY Right 04/27/2019   Procedure: EXTRACORPOREAL SHOCK WAVE LITHOTRIPSY (ESWL);  Surgeon: Dahlstedt, Stephen, MD;  Location: [facility];  Service: Urology;  Laterality: Right;   EYE SURGERY     cataract surgery bilat    Gamma Knife     Trigeminal    SLT LASER APPLICATION Left 09/27/2014   Procedure: SLT LASER APPLICATION;  Surgeon: Carroll F Haines, MD;  Location: [facility];  Service: Ophthalmology;  Laterality: Left;    Medications: I have reviewed the patient's current medications. Allergies:  Allergies  Allergen Reactions   Sulfa Antibiotics Other (See Comments)    Pt states that this med makes her feel crazy.      Family History  Problem Relation Age of Onset   Congestive Heart Failure Mother    Heart attack Father    CAD Neg Hx    Social History:  reports that she has never smoked. She has never used smokeless tobacco. She reports that she does not drink alcohol and does not use drugs.  Review of Systems  Constitutional:  Positive for chills and fever.  Gastrointestinal:  Positive for nausea.  Genitourinary:  Positive for dysuria and flank pain.  All other systems reviewed and are negative.  Physical Exam:  Vital signs in last 24 hours: Temp:  [97.5 F (36.4 C)-101.4 F (38.6 C)] 98.8 F (37.1 C) (09/12 0952) Pulse Rate:  [50-164] 127 (09/12 1215) Resp:  [18-40] 26 (09/12 1215) BP: (82-170)/(53-105) 94/65 (09/12 1215) SpO2:  [91 %-100 %]   92 % (09/12 1215) Weight:  [62.5 kg] 62.5 kg (09/11 2245) Physical Exam Constitutional:      Appearance: Normal appearance.  HENT:     Head: Normocephalic and atraumatic.     Nose: Nose normal. No congestion.  Eyes:     Extraocular Movements: Extraocular movements intact.     Pupils: Pupils are equal, round, and reactive to light.  Cardiovascular:     Rate and Rhythm: Normal rate and regular rhythm.  Pulmonary:     Effort: Pulmonary effort is  normal. No respiratory distress.  Abdominal:     General: Abdomen is flat. There is no distension.  Musculoskeletal:        General: Normal range of motion.     Cervical back: Normal range of motion and neck supple.  Skin:    General: Skin is warm and dry.  Neurological:     General: No focal deficit present.     Mental Status: She is alert and oriented to person, place, and time.  Psychiatric:        Mood and Affect: Mood normal.        Behavior: Behavior normal.        Thought Content: Thought content normal.        Judgment: Judgment normal.    Laboratory Data:  Results for orders placed or performed during the hospital encounter of 01/01/21 (from the past 72 hour(s))  Urinalysis, Routine w reflex microscopic Urine, Clean Catch     Status: Abnormal   Collection Time: 01/01/21 11:00 PM  Result Value Ref Range   Color, Urine YELLOW YELLOW   APPearance HAZY (A) CLEAR   Specific Gravity, Urine 1.025 1.005 - 1.030   pH 6.0 5.0 - 8.0   Glucose, UA NEGATIVE NEGATIVE mg/dL   Hgb urine dipstick LARGE (A) NEGATIVE   Bilirubin Urine NEGATIVE NEGATIVE   Ketones, ur >80 (A) NEGATIVE mg/dL   Protein, ur 30 (A) NEGATIVE mg/dL   Nitrite POSITIVE (A) NEGATIVE   Leukocytes,Ua SMALL (A) NEGATIVE    Comment: Performed at [facility], [address] [place], [place] [address]  Urinalysis, Microscopic (reflex)     Status: Abnormal   Collection Time: 01/01/21 11:00 PM  Result Value Ref Range   RBC / HPF 21-50 0 - 5 RBC/hpf   WBC, UA 11-20 0 - 5 WBC/hpf   Bacteria, UA MANY (A) NONE SEEN   Squamous Epithelial / LPF 11-20 0 - 5    Comment: Performed at [facility], [address] [place], [place] [address]  CBC with Differential/Platelet     Status: Abnormal   Collection Time: 01/02/21 12:57 AM  Result Value Ref Range   WBC 9.2 4.0 - 10.5 K/uL   RBC 5.29 (H) 3.87 - 5.11 MIL/uL   Hemoglobin 16.4 (H) 12.0 - 15.0 g/dL   HCT 09.849.4 (H) 11.936.0 - 14.746.0 %   MCV 93.4 80.0 - 100.0 fL   MCH 31.0 26.0  - 34.0 pg   MCHC 33.2 30.0 - 36.0 g/dL   RDW 82.912.8 56.211.5 - 13.015.5 %   Platelets 143 (L) 150 - 400 K/uL   nRBC 0.0 0.0 - 0.2 %   Neutrophils Relative % 93 %   Neutro Abs 8.5 (H) 1.7 - 7.7 K/uL   Lymphocytes Relative 4 %   Lymphs Abs 0.4 (L) 0.7 - 4.0 K/uL   Monocytes Relative 3 %   Monocytes Absolute 0.3 0.1 - 1.0 K/uL   Eosinophils Relative 0 %   Eosinophils  Absolute 0.0 0.0 - 0.5 K/uL   Basophils Relative 0 %   Basophils Absolute 0.0 0.0 - 0.1 K/uL   Immature Granulocytes 0 %   Abs Immature Granulocytes 0.04 0.00 - 0.07 K/uL    Comment: Performed at [facility], [address], [place], [place] [address]  Basic metabolic panel     Status: Abnormal   Collection Time: 01/02/21 12:57 AM  Result Value Ref Range   Sodium 137 135 - 145 mmol/L   Potassium 3.7 3.5 - 5.1 mmol/L   Chloride 95 (L) 98 - 111 mmol/L   CO2 27 22 - 32 mmol/L   Glucose, Bld 158 (H) 70 - 99 mg/dL    Comment: Glucose reference range applies only to samples taken after fasting for at least 8 hours.   BUN 17 8 - 23 mg/dL   Creatinine, Ser 3.32 0.44 - 1.00 mg/dL   Calcium 9.8 8.9 - 95.1 mg/dL   GFR, Estimated 60 (L) >60 mL/min    Comment: (NOTE) Calculated using the CKD-EPI Creatinine Equation (2021)    Anion gap 15 5 - 15    Comment: Performed at [facility], [address], [place], [place] [address]  Resp Panel by RT-PCR (Flu A&B, Covid) Nasopharyngeal Swab     Status: None   Collection Time: 01/02/21  4:17 AM   Specimen: Nasopharyngeal Swab; Nasopharyngeal(NP) swabs in vial transport medium  Result Value Ref Range   SARS Coronavirus 2 by RT PCR NEGATIVE NEGATIVE    Comment: (NOTE) SARS-CoV-2 target nucleic acids are NOT DETECTED.  The SARS-CoV-2 RNA is generally detectable in upper respiratory specimens during the acute phase of infection. The lowest concentration of SARS-CoV-2 viral copies this assay can detect is 138 copies/mL. A negative result does not preclude SARS-Cov-2 infection and should not be  used as the sole basis for treatment or other patient management decisions. A negative result may occur with  improper specimen collection/handling, submission of specimen other than nasopharyngeal swab, presence of viral mutation(s) within the areas targeted by this assay, and inadequate number of viral copies(<138 copies/mL). A negative result must be combined with clinical observations, patient history, and epidemiological information. The expected result is Negative.  Fact Sheet for Patients:  BloggerCourse.com  Fact Sheet for Healthcare Providers:  SeriousBroker.it  This test is no t yet approved or cleared by the [place] FDA and  has been authorized for detection and/or diagnosis of SARS-CoV-2 by FDA under an Emergency Use Authorization (EUA). This EUA will remain  in effect (meaning this test can be used) for the duration of the COVID-19 declaration under Section 564(b)(1) of the Act, 21 U.S.C.section 360bbb-3(b)(1), unless the authorization is terminated  or revoked sooner.       Influenza A by PCR NEGATIVE NEGATIVE   Influenza B by PCR NEGATIVE NEGATIVE    Comment: (NOTE) The Xpert Xpress SARS-CoV-2/FLU/RSV plus assay is intended as an aid in the diagnosis of influenza from Nasopharyngeal swab specimens and should not be used as a sole basis for treatment. Nasal washings and aspirates are unacceptable for Xpert Xpress SARS-CoV-2/FLU/RSV testing.  Fact Sheet for Patients: BloggerCourse.com  Fact Sheet for Healthcare Providers: SeriousBroker.it  This test is not yet approved or cleared by the [place] FDA and has been authorized for detection and/or diagnosis of SARS-CoV-2 by FDA under an Emergency Use Authorization (EUA). This EUA will remain in effect (meaning this test can be used) for the duration of the COVID-19 declaration under Section 564(b)(1) of the  Act, 21 U.S.C. section 360bbb-3(b)(1), unless the authorization is terminated or revoked.  Performed at [facility], [address] [place], [place] [address]   Culture, blood (single)     Status: None (Preliminary result)   Collection Time: 01/02/21  4:46 AM   Specimen: Vein; Blood  Result Value Ref Range   Specimen Description LEFT ANTECUBITAL    Special Requests      BOTTLES DRAWN AEROBIC AND ANAEROBIC Blood Culture adequate volume   Culture      NO GROWTH < 12 HOURS Performed at [facility], [address] [place], [place] [address]    Report Status PENDING   Lactic acid, plasma     Status: Abnormal   Collection Time: 01/02/21  4:46 AM  Result Value Ref Range   Lactic Acid, Venous 4.2 (HH) 0.5 - 1.9 mmol/L    Comment: CRITICAL RESULT CALLED TO, READ BACK BY AND VERIFIED WITH: KAYLA NICHOLS @ 0536 ON 01/02/21 Riesa Pope Performed at [facility], [address], [place], [place] [address]   Lactic acid, plasma     Status: Abnormal   Collection Time: 01/02/21  8:00 AM  Result Value Ref Range   Lactic Acid, Venous 2.1 (HH) 0.5 - 1.9 mmol/L    Comment: CRITICAL VALUE NOTED.  VALUE IS CONSISTENT WITH PREVIOUSLY REPORTED AND CALLED VALUE. Performed at [facility], [address], [place], [place] [address]   Lactic acid, plasma     Status: Abnormal   Collection Time: 01/02/21  8:48 AM  Result Value Ref Range   Lactic Acid, Venous 3.1 (HH) 0.5 - 1.9 mmol/L    Comment: CRITICAL VALUE NOTED.  VALUE IS CONSISTENT WITH PREVIOUSLY REPORTED AND CALLED VALUE. Performed at [facility], [address], [place], [place] [address]   Protime-INR     Status: Abnormal   Collection Time: 01/02/21  8:48 AM  Result Value Ref Range   Prothrombin Time 16.8 (H) 11.4 - 15.2 seconds   INR 1.4 (H) 0.8 - 1.2    Comment: (NOTE) INR goal varies based on device and disease states. Performed at [facility], [address] [place], [place] [address]   APTT     Status: None   Collection Time:  01/02/21  8:48 AM  Result Value Ref Range   aPTT 28 24 - 36 seconds    Comment: Performed at [facility], [address], [place], [place] [address]  Magnesium     Status: Abnormal   Collection Time: 01/02/21  8:48 AM  Result Value Ref Range   Magnesium 1.4 (L) 1.7 - 2.4 mg/dL    Comment: Performed at [facility], [address], [place], [place] [address]  Phosphorus     Status: Abnormal   Collection Time: 01/02/21  8:48 AM  Result Value Ref Range   Phosphorus 2.2 (L) 2.5 - 4.6 mg/dL    Comment: Performed at [facility], [address], [place], [place] [address]   Recent Results (from the past 240 hour(s))  Resp Panel by RT-PCR (Flu A&B, Covid) Nasopharyngeal Swab     Status: None   Collection Time: 01/02/21  4:17 AM   Specimen: Nasopharyngeal Swab; Nasopharyngeal(NP) swabs in vial transport medium  Result Value Ref Range Status   SARS Coronavirus 2 by RT PCR NEGATIVE NEGATIVE Final    Comment: (NOTE) SARS-CoV-2 target nucleic acids are NOT DETECTED.  The SARS-CoV-2 RNA is generally detectable in upper respiratory specimens during the acute phase of infection. The lowest concentration of SARS-CoV-2  viral copies this assay can detect is 138 copies/mL. A negative result does not preclude SARS-Cov-2 infection and should not be used as the sole basis for treatment or other patient management decisions. A negative result may occur with  improper specimen collection/handling, submission of specimen other than nasopharyngeal swab, presence of viral mutation(s) within the areas targeted by this assay, and inadequate number of viral copies(<138 copies/mL). A negative result must be combined with clinical observations, patient history, and epidemiological information. The expected result is Negative.  Fact Sheet for Patients:  BloggerCourse.com  Fact Sheet for Healthcare Providers:  SeriousBroker.it  This test is no t yet approved or  cleared by the [place] FDA and  has been authorized for detection and/or diagnosis of SARS-CoV-2 by FDA under an Emergency Use Authorization (EUA). This EUA will remain  in effect (meaning this test can be used) for the duration of the COVID-19 declaration under Section 564(b)(1) of the Act, 21 U.S.C.section 360bbb-3(b)(1), unless the authorization is terminated  or revoked sooner.       Influenza A by PCR NEGATIVE NEGATIVE Final   Influenza B by PCR NEGATIVE NEGATIVE Final    Comment: (NOTE) The Xpert Xpress SARS-CoV-2/FLU/RSV plus assay is intended as an aid in the diagnosis of influenza from Nasopharyngeal swab specimens and should not be used as a sole basis for treatment. Nasal washings and aspirates are unacceptable for Xpert Xpress SARS-CoV-2/FLU/RSV testing.  Fact Sheet for Patients: BloggerCourse.com  Fact Sheet for Healthcare Providers: SeriousBroker.it  This test is not yet approved or cleared by the [place] FDA and has been authorized for detection and/or diagnosis of SARS-CoV-2 by FDA under an Emergency Use Authorization (EUA). This EUA will remain in effect (meaning this test can be used) for the duration of the COVID-19 declaration under Section 564(b)(1) of the Act, 21 U.S.C. section 360bbb-3(b)(1), unless the authorization is terminated or revoked.  Performed at [facility], [address], [place], [place] [address]   Culture, blood (single)     Status: None (Preliminary result)   Collection Time: 01/02/21  4:46 AM   Specimen: Vein; Blood  Result Value Ref Range Status   Specimen Description LEFT ANTECUBITAL  Final   Special Requests   Final    BOTTLES DRAWN AEROBIC AND ANAEROBIC Blood Culture adequate volume   Culture   Final    NO GROWTH < 12 HOURS Performed at [facility], [address], [place], [place] [address]    Report Status PENDING  Incomplete   Creatinine: Recent Labs     01/02/21 0057  CREATININE 0.91   Baseline Creatinine: 0.9  Impression/Assessment:  87yo with right ureteral calculus, sepsis  Plan:  -We discussed the management of kidney stones. These options include observation, ureteroscopy, shockwave lithotripsy (ESWL) and percutaneous nephrolithotomy (PCNL). We discussed which options are relevant to the patient's stone(s). We discussed the natural history of kidney stones as well as the complications of untreated stones and the impact on quality of life without treatment as well as with each of the above listed treatments. We also discussed the efficacy of each treatment in its ability to clear the stone burden. With any of these management options I discussed the signs and symptoms of infection and the need for emergent treatment should these be experienced. For each option we discussed the ability of each procedure to clear the patient of their stone burden.   For observation I described the risks which include but are not limited to silent renal damage,  life-threatening infection, need for emergent surgery, failure to pass stone and pain.   For ureteroscopy I described the risks which include bleeding, infection, damage to contiguous structures, positioning injury, ureteral stricture, ureteral avulsion, ureteral injury, need for prolonged ureteral stent, inability to perform ureteroscopy, need for an interval procedure, inability to clear stone burden, stent discomfort/pain, heart attack, stroke, pulmonary embolus and the inherent risks with general anesthesia.   For shockwave lithotripsy I described the risks which include arrhythmia, kidney contusion, kidney hemorrhage, need for transfusion, pain, inability to adequately break up stone, inability to pass stone fragments, Steinstrasse, infection associated with obstructing stones, need for alternate surgical procedure, need for repeat shockwave lithotripsy, MI, CVA, PE and the inherent risks with  anesthesia/conscious sedation.   For PCNL I described the risks including positioning injury, pneumothorax, hydrothorax, need for chest tube, inability to clear stone burden, renal laceration, arterial venous fistula or malformation, need for embolization of kidney, loss of kidney or renal function, need for repeat procedure, need for prolonged nephrostomy tube, ureteral avulsion, MI, CVA, PE and the inherent risks of general anesthesia.   - The patient would like to proceed with right ureteral stent placement  Wilkie Aye 01/02/2021, 12:33 PM

## 2021-01-03 ENCOUNTER — Inpatient Hospital Stay (HOSPITAL_COMMUNITY): Payer: PPO

## 2021-01-03 ENCOUNTER — Encounter (HOSPITAL_COMMUNITY): Payer: Self-pay | Admitting: Urology

## 2021-01-03 DIAGNOSIS — I4891 Unspecified atrial fibrillation: Secondary | ICD-10-CM

## 2021-01-03 LAB — CBC
HCT: 40.4 % (ref 36.0–46.0)
Hemoglobin: 12.8 g/dL (ref 12.0–15.0)
MCH: 30.5 pg (ref 26.0–34.0)
MCHC: 31.7 g/dL (ref 30.0–36.0)
MCV: 96.4 fL (ref 80.0–100.0)
Platelets: 76 10*3/uL — ABNORMAL LOW (ref 150–400)
RBC: 4.19 MIL/uL (ref 3.87–5.11)
RDW: 13.4 % (ref 11.5–15.5)
WBC: 19.6 10*3/uL — ABNORMAL HIGH (ref 4.0–10.5)
nRBC: 0 % (ref 0.0–0.2)

## 2021-01-03 LAB — ECHOCARDIOGRAM COMPLETE
AR max vel: 1.77 cm2
AV Area VTI: 1.78 cm2
AV Area mean vel: 1.62 cm2
AV Mean grad: 7 mmHg
AV Peak grad: 11.6 mmHg
Ao pk vel: 1.7 m/s
Area-P 1/2: 3.63 cm2
Calc EF: 59.1 %
Height: 63 in
MV VTI: 2.41 cm2
P 1/2 time: 312 msec
S' Lateral: 2.64 cm
Single Plane A2C EF: 59.3 %
Single Plane A4C EF: 58.9 %
Weight: 2204.6 oz

## 2021-01-03 LAB — COMPREHENSIVE METABOLIC PANEL
ALT: 20 U/L (ref 0–44)
AST: 21 U/L (ref 15–41)
Albumin: 2.9 g/dL — ABNORMAL LOW (ref 3.5–5.0)
Alkaline Phosphatase: 49 U/L (ref 38–126)
Anion gap: 10 (ref 5–15)
BUN: 18 mg/dL (ref 8–23)
CO2: 27 mmol/L (ref 22–32)
Calcium: 7.8 mg/dL — ABNORMAL LOW (ref 8.9–10.3)
Chloride: 101 mmol/L (ref 98–111)
Creatinine, Ser: 0.83 mg/dL (ref 0.44–1.00)
GFR, Estimated: >60 mL/min (ref >60)
Glucose, Bld: 105 mg/dL — ABNORMAL HIGH (ref 70–99)
Potassium: 3.2 mmol/L — ABNORMAL LOW (ref 3.5–5.1)
Sodium: 138 mmol/L (ref 135–145)
Total Bilirubin: 0.9 mg/dL (ref 0.3–1.2)
Total Protein: 5.5 g/dL — ABNORMAL LOW (ref 6.5–8.1)

## 2021-01-03 LAB — C DIFFICILE QUICK SCREEN W PCR REFLEX
C Diff antigen: NEGATIVE
C Diff interpretation: NOT DETECTED
C Diff toxin: NEGATIVE

## 2021-01-03 MED ORDER — K PHOS MONO-SOD PHOS DI & MONO 155-852-130 MG PO TABS
500.0000 mg | ORAL_TABLET | Freq: Two times a day (BID) | ORAL | Status: DC
  Administered 2021-01-03 – 2021-01-07 (×9): 500 mg via ORAL
  Filled 2021-01-03 (×9): qty 2

## 2021-01-03 MED ORDER — METOPROLOL SUCCINATE ER 25 MG PO TB24
25.0000 mg | ORAL_TABLET | Freq: Every day | ORAL | Status: DC
  Administered 2021-01-03 – 2021-01-07 (×5): 25 mg via ORAL
  Filled 2021-01-03 (×5): qty 1

## 2021-01-03 MED ORDER — MAGNESIUM SULFATE 2 GM/50ML IV SOLN
2.0000 g | Freq: Once | INTRAVENOUS | Status: AC
  Administered 2021-01-03: 2 g via INTRAVENOUS
  Filled 2021-01-03: qty 50

## 2021-01-03 MED ORDER — FUROSEMIDE 10 MG/ML IJ SOLN
40.0000 mg | Freq: Once | INTRAMUSCULAR | Status: AC
  Administered 2021-01-03: 40 mg via INTRAVENOUS
  Filled 2021-01-03: qty 4

## 2021-01-03 NOTE — Progress Notes (Addendum)
PROGRESS NOTE  [name] MPN:[id number] DOB: 1929-04-04 DOA: 01/01/2021 PCP: Benita Stabile, MD   Brief History:  87 year old female with a history of paroxysmal atrial fibrillation, diastolic CHF, hypertension, hyperlipidemia, coronary arterydisease, ascending aortic aneurysm, and nephrolithiasis status post lithotripsy May 2018 presenting with right-sided abdominal pain/right flank pain that began around 1 PM on 01/01/2021.  The patient had been in her usual state of health prior to the beginning of her episodes.  She has some nausea without emesis.  She has some subjective fevers and chills.  She denies any headache, neck pain, chest pain, shortness breath, coughing, hemoptysis, diarrhea, hematochezia, melena. In the emergency department, the patient was febrile up to 101.3 F with tachycardia 120.  She was hemodynamically stable with oxygen saturation 100% on room air.  BMP shows sodium 137, potassium 3.7, serum creatinine of 0.91.  WBC 9.2, hemoglobin 16.4, platelets143K.  Urology was consulted, and there are plans for Dr. Ronne Binning to see the patient for possible ureteral stent placement.   Assessment/Plan: Severe Sepsis -due to UTI -Patient presented with fever, tachycardia, and elevated lactic acid -Continue IV fluids>>saline lock -Continue ceftriaxone -lactate peaked 4.6   Pyelonephritis/ureteral stone -Urology consulted -01/01/21 CT renal--7 mm distal right ureteral stone with right perinephric stranding and right hydronephrosis -antibiotic broadened to zosyn pending final culture data -d/c vanco -IV fluids>>saline lock  Paroxysmal Atrial Fibrillation with RVR -metoprolol and diltiazem were held initially due to soft BPs and npo for surgery -started on amiodarone drip>>converted to sinus -appreciate cardiology following -restarted low dose metoprolol -Restart apixaban when ok with with urology  Acute Respiratory Failure with Hypoxia -due to pulmonary  edema -stable on 2L -Lasix 40 mg IV x 1 -01/03/21 Echo--EF 60-65%, no WMA, severe elevated PASP; mod-severe TR -personally reviewed CXR--increased interstitial markings   Thrombocytopenia -This has been chronic in the past dating back to April 2018 -Monitor for signs of bleeding   Acute on Chronic diastolic CHF -Appears clinically euvolemic -12/22/2018 echo EF >65%; mild MR, no WMA -01/03/21 Echo--EF 60-65%, no WMA, severe elevated PASP; mod-severe TR -lasix 40 mg IV x 1   Coronary artery disease -No chest pain -Personally reviewed EKG--- sinus rhythm, no concerning ST-T wave change   Ascending aortic aneurysm -Measuring 5.2 cm on last imaging -Patient follows Dr. Emi Holes to be a poor surgical candidate   Hyperlipidemia -Continue statin  Hypomagnesemia/Hypophosphatemia/Hypokalemia -replere           Status is: Inpatient   Remains inpatient appropriate because:Inpatient level of care appropriate due to severity of illness   Dispo: The patient is from: Home              Anticipated d/c is to: Home              Patient currently is not medically stable to d/c.              Difficult to place patient No               Family Communication:   son updated on 9/13   Consultants:  urology   Code Status:  FULL    DVT Prophylaxis:  apixaban     Procedures: As Listed in Progress Note Above   Antibiotics: Ceftriaxone 9/11>>          Subjective: Patient denies fevers, chills, headache, chest pain, dyspnea, nausea, vomiting, diarrhea, abdominal pain, dysuria, hematuria, hematochezia, and melena.   Objective: Vitals:  01/03/21 0900 01/03/21 0941 01/03/21 1000 01/03/21 1100  BP: (!) 153/90 (!) 148/75 (!) 149/77 133/73  Pulse: 80 81 79 75  Resp:   20   Temp:      TempSrc:      SpO2:   92% 93%  Weight:      Height:        Intake/Output Summary (Last 24 hours) at 01/03/2021 1324 Last data filed at 01/03/2021 0942 Gross per 24 hour  Intake  1137.83 ml  Output 375 ml  Net 762.83 ml   Weight change:  Exam:  General:  Pt is alert, follows commands appropriately, not in acute distress HEENT: No icterus, No thrush, No neck mass, [place]/AT Cardiovascular: RRR, S1/S2, no rubs, no gallops Respiratory: bilateral crackles. No wheeze Abdomen: Soft/+BS, non tender, non distended, no guarding Extremities: No edema, No lymphangitis, No petechiae, No rashes, no synovitis   Data Reviewed: I have personally reviewed following labs and imaging studies Basic Metabolic Panel: Recent Labs  Lab 01/02/21 0057 01/02/21 0848 01/03/21 0531  NA 137  --  138  K 3.7  --  3.2*  CL 95*  --  101  CO2 27  --  27  GLUCOSE 158*  --  105*  BUN 17  --  18  CREATININE 0.91  --  0.83  CALCIUM 9.8  --  7.8*  MG  --  1.4*  --   PHOS  --  2.2*  --    Liver Function Tests: Recent Labs  Lab 01/03/21 0531  AST 21  ALT 20  ALKPHOS 49  BILITOT 0.9  PROT 5.5*  ALBUMIN 2.9*   No results for input(s): LIPASE, AMYLASE in the last 168 hours. No results for input(s): AMMONIA in the last 168 hours. Coagulation Profile: Recent Labs  Lab 01/02/21 0848  INR 1.4*   CBC: Recent Labs  Lab 01/02/21 0057 01/03/21 0531  WBC 9.2 19.6*  NEUTROABS 8.5*  --   HGB 16.4* 12.8  HCT 49.4* 40.4  MCV 93.4 96.4  PLT 143* 76*   Cardiac Enzymes: No results for input(s): CKTOTAL, CKMB, CKMBINDEX, TROPONINI in the last 168 hours. BNP: Invalid input(s): POCBNP CBG: No results for input(s): GLUCAP in the last 168 hours. HbA1C: No results for input(s): HGBA1C in the last 72 hours. Urine analysis:    Component Value Date/Time   COLORURINE YELLOW 01/01/2021 2300   APPEARANCEUR HAZY (A) 01/01/2021 2300   APPEARANCEUR Hazy (A) 11/15/2020 0944   LABSPEC 1.025 01/01/2021 2300   PHURINE 6.0 01/01/2021 2300   GLUCOSEU NEGATIVE 01/01/2021 2300   HGBUR LARGE (A) 01/01/2021 2300   BILIRUBINUR NEGATIVE 01/01/2021 2300   BILIRUBINUR Negative 11/15/2020 0944    KETONESUR >80 (A) 01/01/2021 2300   PROTEINUR 30 (A) 01/01/2021 2300   UROBILINOGEN negative (A) 05/19/2019 0844   NITRITE POSITIVE (A) 01/01/2021 2300   LEUKOCYTESUR SMALL (A) 01/01/2021 2300   Sepsis Labs: @LABRCNTIP (procalcitonin:4,lacticidven:4) ) Recent Results (from the past 240 hour(s))  Urine Culture     Status: Abnormal (Preliminary result)   Collection Time: 01/01/21 11:00 PM   Specimen: Urine, Clean Catch  Result Value Ref Range Status   Specimen Description   Final    URINE, CLEAN CATCH Performed at [facility], [address] [place], [place]    Special Requests   Final    NONE Performed at [facility], [address] [place], [place]    Culture (A)  Final    >=100,000 COLONIES/mL ESCHERICHIA COLI  SUSCEPTIBILITIES TO FOLLOW Performed at [facility], [address], [place], [place] [address]    Report Status PENDING  Incomplete  Resp Panel by RT-PCR (Flu A&B, Covid) Nasopharyngeal Swab     Status: None   Collection Time: 01/02/21  4:17 AM   Specimen: Nasopharyngeal Swab; Nasopharyngeal(NP) swabs in vial transport medium  Result Value Ref Range Status   SARS Coronavirus 2 by RT PCR NEGATIVE NEGATIVE Final    Comment: (NOTE) SARS-CoV-2 target nucleic acids are NOT DETECTED.  The SARS-CoV-2 RNA is generally detectable in upper respiratory specimens during the acute phase of infection. The lowest concentration of SARS-CoV-2 viral copies this assay can detect is 138 copies/mL. A negative result does not preclude SARS-Cov-2 infection and should not be used as the sole basis for treatment or other patient management decisions. A negative result may occur with  improper specimen collection/handling, submission of specimen other than nasopharyngeal swab, presence of viral mutation(s) within the areas targeted by this assay, and inadequate number of viral copies(<138 copies/mL). A negative result must be combined with clinical  observations, patient history, and epidemiological information. The expected result is Negative.  Fact Sheet for Patients:  BloggerCourse.com  Fact Sheet for Healthcare Providers:  SeriousBroker.it  This test is no t yet approved or cleared by the [place] FDA and  has been authorized for detection and/or diagnosis of SARS-CoV-2 by FDA under an Emergency Use Authorization (EUA). This EUA will remain  in effect (meaning this test can be used) for the duration of the COVID-19 declaration under Section 564(b)(1) of the Act, 21 U.S.C.section 360bbb-3(b)(1), unless the authorization is terminated  or revoked sooner.       Influenza A by PCR NEGATIVE NEGATIVE Final   Influenza B by PCR NEGATIVE NEGATIVE Final    Comment: (NOTE) The Xpert Xpress SARS-CoV-2/FLU/RSV plus assay is intended as an aid in the diagnosis of influenza from Nasopharyngeal swab specimens and should not be used as a sole basis for treatment. Nasal washings and aspirates are unacceptable for Xpert Xpress SARS-CoV-2/FLU/RSV testing.  Fact Sheet for Patients: BloggerCourse.com  Fact Sheet for Healthcare Providers: SeriousBroker.it  This test is not yet approved or cleared by the [place] FDA and has been authorized for detection and/or diagnosis of SARS-CoV-2 by FDA under an Emergency Use Authorization (EUA). This EUA will remain in effect (meaning this test can be used) for the duration of the COVID-19 declaration under Section 564(b)(1) of the Act, 21 U.S.C. section 360bbb-3(b)(1), unless the authorization is terminated or revoked.  Performed at [facility], [address] [place], [place] [address]   Culture, blood (single)     Status: None (Preliminary result)   Collection Time: 01/02/21  4:46 AM   Specimen: Left Antecubital; Blood  Result Value Ref Range Status   Specimen Description LEFT  ANTECUBITAL  Final   Special Requests   Final    BOTTLES DRAWN AEROBIC AND ANAEROBIC Blood Culture adequate volume   Culture   Final    NO GROWTH 1 DAY Performed at [facility], [address], [place], [place] [address]    Report Status PENDING  Incomplete  MRSA Next Gen by PCR, Nasal     Status: None   Collection Time: 01/02/21  3:46 PM   Specimen: Nasal Mucosa; Nasal Swab  Result Value Ref Range Status   MRSA by PCR Next Gen NOT DETECTED NOT DETECTED Final    Comment: (NOTE) The GeneXpert MRSA Assay (FDA approved for NASAL specimens  only), is one component of a comprehensive MRSA colonization surveillance program. It is not intended to diagnose MRSA infection nor to guide or monitor treatment for MRSA infections. Test performance is not FDA approved in patients less than 24 years old. Performed at [facility], [address], [place], [place] [address]      Scheduled Meds:  Chlorhexidine Gluconate Cloth  6 each Topical Daily   furosemide  40 mg Intravenous Once   hydrocortisone sod succinate (SOLU-CORTEF) inj  100 mg Intravenous Daily   metoprolol succinate  25 mg Oral Daily   pantoprazole  40 mg Oral Daily   pravastatin  40 mg Oral QHS   sodium chloride flush  10-40 mL Intracatheter Q12H   Continuous Infusions:  amiodarone 30 mg/hr (01/03/21 1216)   phenylephrine (NEO-SYNEPHRINE) Adult infusion Stopped (01/02/21 1136)   piperacillin-tazobactam (ZOSYN)  IV 3.375 g (01/03/21 0941)   vancomycin      Procedures/Studies: DG CHEST PORT 1 VIEW  Result Date: 01/02/2021 CLINICAL DATA:  PICC line placement EXAM: PORTABLE CHEST 1 VIEW COMPARISON:  08/27/2020 FINDINGS: The heart is enlarged. There is atherosclerosis and tortuosity of the aorta. There is interstitial pulmonary edema and bilateral pleural effusions and dependent atelectasis, left more than right. Basilar pneumonia not excluded. Right arm PICC tip extends to the SVC above the right atrium. Monitor lead overlies the  distal catheter in the exact location of the tip can not be established. IMPRESSION: Congestive heart failure pattern. Effusions and basilar atelectasis left worse than right. Right arm PICC tip is at least to the level of the SVC above the right atrium. The exact termination can not be established because of an overlying monitor lead. Electronically Signed   By: Paulina Fusi M.D.   On: 01/02/2021 13:03   DG C-Arm 1-60 Min-No Report  Result Date: 01/02/2021 Fluoroscopy was utilized by the requesting physician.  No radiographic interpretation.   ECHOCARDIOGRAM COMPLETE  Result Date: 01/03/2021    ECHOCARDIOGRAM REPORT   Patient Name:   Daylani A Boakye Date of Exam: 01/03/2021 Medical Rec #:  [id number]       Height:       63.0 in Accession #:    [id number]      Weight:       137.8 lb Date of Birth:  [birth year]        BSA:          1.650 m Patient Age:    89 years        BP:           148/75 mmHg Patient Gender: F               HR:           79 bpm. [facility] Procedure: 2D Echo, Color Doppler and Cardiac Doppler Indications:    Atrial Fibrillation  History:        Patient has prior history of Echocardiogram examinations, most                 recent 12/22/2018. CHF, CAD, Arrythmias:Atrial Fibrillation,                 Signs/Symptoms:Dyspnea; Risk Factors:Hypertension and                 Dyslipidemia. Ascending aortic aneurysm.  Sonographer:    Mikki Harbor Referring Phys: [id number] SAMUEL G MCDOWELL IMPRESSIONS  1. Left ventricular ejection fraction, by estimation, is 60 to 65%. The left ventricle  has normal function. The left ventricle has no regional wall motion abnormalities. Left ventricular diastolic parameters were normal.  2. Right ventricular systolic function is normal. The right ventricular size is mildly enlarged. There is severely elevated pulmonary artery systolic pressure. The estimated right ventricular systolic pressure is 63.4 mmHg.  3. Left atrial size was mildly dilated.  4. Right  atrial size was mildly dilated.  5. The mitral valve is degenerative. Mild to moderate mitral valve regurgitation with very eccentric jet.  6. Tricuspid valve regurgitation is moderate to severe.  7. The aortic valve is tricuspid. There is mild calcification of the aortic valve. Aortic valve regurgitation is mild. Mild to moderate aortic valve sclerosis/calcification is present, without any evidence of aortic stenosis. Aortic valve mean gradient measures 7.0 mmHg.  8. Aortic dilatation noted. There is moderate dilatation of the ascending aorta, measuring 42 mm.  9. The inferior vena cava is dilated in size with <50% respiratory variability, suggesting right atrial pressure of 15 mmHg. Comparison(s): Prior images reviewed side by side. Degree of tricuspid regurgitation and elevated RVSP more prominent in comparison. FINDINGS  Left Ventricle: Left ventricular ejection fraction, by estimation, is 60 to 65%. The left ventricle has normal function. The left ventricle has no regional wall motion abnormalities. The left ventricular internal cavity size was normal in size. There is  borderline left ventricular hypertrophy. Left ventricular diastolic parameters were normal. Right Ventricle: The right ventricular size is mildly enlarged. No increase in right ventricular wall thickness. Right ventricular systolic function is normal. There is severely elevated pulmonary artery systolic pressure. The tricuspid regurgitant velocity is 3.48 m/s, and with an assumed right atrial pressure of 15 mmHg, the estimated right ventricular systolic pressure is 63.4 mmHg. Left Atrium: Left atrial size was mildly dilated. Right Atrium: Right atrial size was mildly dilated. Pericardium: There is no evidence of pericardial effusion. Mitral Valve: The mitral valve is degenerative in appearance. There is mild calcification of the mitral valve leaflet(s). Mild mitral annular calcification. Mild to moderate mitral valve regurgitation. No evidence  of mitral valve stenosis. MV peak gradient, 5.0 mmHg. The mean mitral valve gradient is 2.0 mmHg. Tricuspid Valve: The tricuspid valve is grossly normal. Tricuspid valve regurgitation is moderate to severe. Aortic Valve: The aortic valve is tricuspid. There is mild calcification of the aortic valve. There is mild aortic valve annular calcification. Aortic valve regurgitation is mild. Aortic regurgitation PHT measures 312 msec. Mild to moderate aortic valve sclerosis/calcification is present, without any evidence of aortic stenosis. Aortic valve mean gradient measures 7.0 mmHg. Aortic valve peak gradient measures 11.6 mmHg. Aortic valve area, by VTI measures 1.78 cm. Pulmonic Valve: The pulmonic valve was grossly normal. Pulmonic valve regurgitation is mild. Aorta: The aortic root is normal in size and structure and aortic dilatation noted. There is moderate dilatation of the ascending aorta, measuring 42 mm. Venous: The inferior vena cava is dilated in size with less than 50% respiratory variability, suggesting right atrial pressure of 15 mmHg. IAS/Shunts: No atrial level shunt detected by color flow Doppler.  LEFT VENTRICLE PLAX 2D LVIDd:         4.48 cm     Diastology LVIDs:         2.64 cm     LV e' medial:    11.90 cm/s LV PW:         0.90 cm     LV E/e' medial:  7.1 LV IVS:        1.07 cm  LV e' lateral:   13.10 cm/s LVOT diam:     1.90 cm     LV E/e' lateral: 6.5 LV SV:         63 LV SV Index:   38 LVOT Area:     2.84 cm  LV Volumes (MOD) LV vol d, MOD A2C: 48.9 ml LV vol d, MOD A4C: 47.2 ml LV vol s, MOD A2C: 19.9 ml LV vol s, MOD A4C: 19.4 ml LV SV MOD A2C:     29.0 ml LV SV MOD A4C:     47.2 ml LV SV MOD BP:      28.3 ml RIGHT VENTRICLE RV Basal diam:  4.14 cm RV Mid diam:    3.78 cm RV S prime:     16.30 cm/s TAPSE (M-mode): 2.0 cm LEFT ATRIUM             Index       RIGHT ATRIUM           Index LA diam:        3.80 cm 2.30 cm/m  RA Area:     18.90 cm LA Vol (A2C):   56.9 ml 34.48 ml/m RA Volume:    56.40 ml  34.17 ml/m LA Vol (A4C):   60.8 ml 36.84 ml/m LA Biplane Vol: 60.3 ml 36.54 ml/m  AORTIC VALVE AV Area (Vmax):    1.77 cm AV Area (Vmean):   1.62 cm AV Area (VTI):     1.78 cm AV Vmax:           170.00 cm/s AV Vmean:          125.000 cm/s AV VTI:            0.352 m AV Peak Grad:      11.6 mmHg AV Mean Grad:      7.0 mmHg LVOT Vmax:         106.00 cm/s LVOT Vmean:        71.300 cm/s LVOT VTI:          0.221 m LVOT/AV VTI ratio: 0.63 AI PHT:            312 msec  AORTA Ao Root diam: 3.20 cm Ao Asc diam:  4.20 cm MITRAL VALVE               TRICUSPID VALVE MV Area (PHT): 3.63 cm    TR Peak grad:   48.4 mmHg MV Area VTI:   2.41 cm    TR Vmax:        348.00 cm/s MV Peak grad:  5.0 mmHg MV Mean grad:  2.0 mmHg    SHUNTS MV Vmax:       1.12 m/s    Systemic VTI:  0.22 m MV Vmean:      63.9 cm/s   Systemic Diam: 1.90 cm MV Decel Time: 209 msec MV E velocity: 85.00 cm/s MV A velocity: 98.70 cm/s MV E/A ratio:  0.86 Nona Dell MD Electronically signed by Nona Dell MD Signature Date/Time: 01/03/2021/11:38:28 AM    Final    CT RENAL STONE STUDY  Result Date: 01/02/2021 CLINICAL DATA:  Flank pain.  Concern for kidney stone. EXAM: CT ABDOMEN AND PELVIS WITHOUT CONTRAST TECHNIQUE: Multidetector CT imaging of the abdomen and pelvis was performed following the standard protocol without IV contrast. COMPARISON:  CT of the abdomen pelvis dated 08/19/2016. FINDINGS: Evaluation of this exam is limited in the absence of intravenous contrast. Lower chest: Minimal bibasilar  atelectasis. There is coronary vascular calcification. No intra-abdominal free air or free fluid. Hepatobiliary: The liver is unremarkable. No intrahepatic biliary ductal dilatation. The gallbladder is unremarkable. Pancreas: The pancreas is unremarkable. Spleen: Normal in size without focal abnormality. Adrenals/Urinary Tract: The adrenal glands unremarkable. There is a 7 mm stone in the distal right ureter with mild right hydronephrosis.  There is mild right perinephric stranding. Correlation with urinalysis recommended to exclude UTI. There is a 3 cm left renal cyst. No hydronephrosis or nephrolithiasis on the left the left ureter and urinary bladder appear unremarkable. Stomach/Bowel: Moderate size hiatal hernia. There is sigmoid diverticulosis without active inflammatory changes. There is no bowel obstruction or active inflammation. The appendix is normal. Vascular/Lymphatic: Advanced aortoiliac atherosclerotic disease. The IVC is grossly unremarkable. No portal venous gas. There is no adenopathy. Reproductive: The uterus is anteverted and grossly unremarkable. No adnexal masses. Other: Induration of the subcutaneous soft tissues of the right gluteal region. No fluid collection. Musculoskeletal: Osteopenia with degenerative changes of the spine. Grade 1 L4-L5 anterolisthesis. No acute osseous pathology. IMPRESSION: 1. A 7 mm distal right ureteral stone with mild right hydronephrosis. Correlation with urinalysis recommended to exclude UTI. 2. Sigmoid diverticulosis. No bowel obstruction. Normal appendix. 3. Aortic Atherosclerosis (ICD10-I70.0). Electronically Signed   By: Elgie Collard M.D.   On: 01/02/2021 01:34   [place] EKG SITE RITE  Result Date: 01/02/2021 If Site Rite image not attached, placement could not be confirmed due to current cardiac rhythm.   Catarina Hartshorn, DO  [facility]  If 7PM-7AM, please contact night-coverage [email or web address] Password [email or web address] 01/03/2021, 1:24 PM   LOS: 1 day

## 2021-01-03 NOTE — Progress Notes (Signed)
Progress Note  Patient Name: [name] Date of Encounter: 01/03/2021  Primary Cardiologist: Nona Dell, MD  Subjective   No chest pain or palpitations.  Inpatient Medications    Scheduled Meds:  Chlorhexidine Gluconate Cloth  6 each Topical Daily   hydrocortisone sod succinate (SOLU-CORTEF) inj  100 mg Intravenous Daily   pantoprazole  40 mg Oral Daily   pravastatin  40 mg Oral QHS   sodium chloride flush  10-40 mL Intracatheter Q12H   Continuous Infusions:  amiodarone 30 mg/hr (01/03/21 0731)   phenylephrine (NEO-SYNEPHRINE) Adult infusion Stopped (01/02/21 1136)   piperacillin-tazobactam (ZOSYN)  IV Stopped (01/03/21 0413)   vancomycin     PRN Meds: acetaminophen, HYDROmorphone (DILAUDID) injection, sodium chloride flush   Vital Signs    Vitals:   01/03/21 0400 01/03/21 0500 01/03/21 0600 01/03/21 0700  BP: 127/60 106/66 122/69 131/67  Pulse: 73 76 72 75  Resp: (!) 29 (!) 29 (!) 25 (!) 22  Temp:      TempSrc:      SpO2: 93% 94% 95% 96%  Weight:      Height:        Intake/Output Summary (Last 24 hours) at 01/03/2021 0902 Last data filed at 01/03/2021 0731 Gross per 24 hour  Intake 3910.53 ml  Output 375 ml  Net 3535.53 ml   Filed Weights   01/01/21 2245  Weight: 62.5 kg    Telemetry    Sinus rhythm.  Personally reviewed.  ECG    An ECG dated 01/03/2021 was personally reviewed today and demonstrated:  Sinus rhythm with PAC, low voltage.  Physical Exam   GEN: No acute distress.   Neck: No JVD. Cardiac: RRR, no gallop.  Respiratory: Nonlabored. Clear to auscultation bilaterally. GI: Soft, nontender, bowel sounds present. MS: No edema; No deformity. Neuro:  Nonfocal. Psych: Alert and oriented x 3. Normal affect.  Labs    Chemistry Recent Labs  Lab 01/02/21 0057 01/03/21 0531  NA 137 138  K 3.7 3.2*  CL 95* 101  CO2 27 27  GLUCOSE 158* 105*  BUN 17 18  CREATININE 0.91 0.83  CALCIUM 9.8 7.8*  PROT  --  5.5*  ALBUMIN  --   2.9*  AST  --  21  ALT  --  20  ALKPHOS  --  49  BILITOT  --  0.9  GFRNONAA 60* >60  ANIONGAP 15 10     Hematology Recent Labs  Lab 01/02/21 0057 01/03/21 0531  WBC 9.2 19.6*  RBC 5.29* 4.19  HGB 16.4* 12.8  HCT 49.4* 40.4  MCV 93.4 96.4  MCH 31.0 30.5  MCHC 33.2 31.7  RDW 12.8 13.4  PLT 143* 76*    Radiology    DG CHEST PORT 1 VIEW  Result Date: 01/02/2021 CLINICAL DATA:  PICC line placement EXAM: PORTABLE CHEST 1 VIEW COMPARISON:  08/27/2020 FINDINGS: The heart is enlarged. There is atherosclerosis and tortuosity of the aorta. There is interstitial pulmonary edema and bilateral pleural effusions and dependent atelectasis, left more than right. Basilar pneumonia not excluded. Right arm PICC tip extends to the SVC above the right atrium. Monitor lead overlies the distal catheter in the exact location of the tip can not be established. IMPRESSION: Congestive heart failure pattern. Effusions and basilar atelectasis left worse than right. Right arm PICC tip is at least to the level of the SVC above the right atrium. The exact termination can not be established because of an overlying monitor lead. Electronically Signed  By: Paulina Fusi M.D.   On: 01/02/2021 13:03   DG C-Arm 1-60 Min-No Report  Result Date: 01/02/2021 Fluoroscopy was utilized by the requesting physician.  No radiographic interpretation.   CT RENAL STONE STUDY  Result Date: 01/02/2021 CLINICAL DATA:  Flank pain.  Concern for kidney stone. EXAM: CT ABDOMEN AND PELVIS WITHOUT CONTRAST TECHNIQUE: Multidetector CT imaging of the abdomen and pelvis was performed following the standard protocol without IV contrast. COMPARISON:  CT of the abdomen pelvis dated 08/19/2016. FINDINGS: Evaluation of this exam is limited in the absence of intravenous contrast. Lower chest: Minimal bibasilar atelectasis. There is coronary vascular calcification. No intra-abdominal free air or free fluid. Hepatobiliary: The liver is unremarkable.  No intrahepatic biliary ductal dilatation. The gallbladder is unremarkable. Pancreas: The pancreas is unremarkable. Spleen: Normal in size without focal abnormality. Adrenals/Urinary Tract: The adrenal glands unremarkable. There is a 7 mm stone in the distal right ureter with mild right hydronephrosis. There is mild right perinephric stranding. Correlation with urinalysis recommended to exclude UTI. There is a 3 cm left renal cyst. No hydronephrosis or nephrolithiasis on the left the left ureter and urinary bladder appear unremarkable. Stomach/Bowel: Moderate size hiatal hernia. There is sigmoid diverticulosis without active inflammatory changes. There is no bowel obstruction or active inflammation. The appendix is normal. Vascular/Lymphatic: Advanced aortoiliac atherosclerotic disease. The IVC is grossly unremarkable. No portal venous gas. There is no adenopathy. Reproductive: The uterus is anteverted and grossly unremarkable. No adnexal masses. Other: Induration of the subcutaneous soft tissues of the right gluteal region. No fluid collection. Musculoskeletal: Osteopenia with degenerative changes of the spine. Grade 1 L4-L5 anterolisthesis. No acute osseous pathology. IMPRESSION: 1. A 7 mm distal right ureteral stone with mild right hydronephrosis. Correlation with urinalysis recommended to exclude UTI. 2. Sigmoid diverticulosis. No bowel obstruction. Normal appendix. 3. Aortic Atherosclerosis (ICD10-I70.0). Electronically Signed   By: Elgie Collard M.D.   On: 01/02/2021 01:34   [place] EKG SITE RITE  Result Date: 01/02/2021 If Site Rite image not attached, placement could not be confirmed due to current cardiac rhythm.    Assessment & Plan    1.  Paroxysmal atrial fibrillation with CHA2DS2-VASc score of 6.  She is back in sinus rhythm on IV amiodarone.  Blood pressure more stable.  At home she was on Toprol-XL and Cardizem CD, both held initially.  Also off Eliquis temporarily for urological  procedure.  2.  Sepsis with UTI and pyelonephritis, also with right ureteral stone.  She is status post right ureteral stent placement yesterday.  She remains on broad-spectrum antibiotics.  Urine culture with GNR, blood cultures without growth.  Would continue IV amiodarone in the short-term.  Resume Toprol-XL 25 mg daily.  Need direction from Urology regarding when anticoagulation can be resumed.  Will also obtain an updated echocardiogram in comparison to study from 2020.  Signed, Nona Dell, MD  01/03/2021, 9:02 AM

## 2021-01-04 DIAGNOSIS — A419 Sepsis, unspecified organism: Secondary | ICD-10-CM

## 2021-01-04 DIAGNOSIS — Z95828 Presence of other vascular implants and grafts: Secondary | ICD-10-CM

## 2021-01-04 DIAGNOSIS — N201 Calculus of ureter: Secondary | ICD-10-CM

## 2021-01-04 DIAGNOSIS — E876 Hypokalemia: Secondary | ICD-10-CM

## 2021-01-04 LAB — BASIC METABOLIC PANEL
Anion gap: 9 (ref 5–15)
BUN: 16 mg/dL (ref 8–23)
CO2: 33 mmol/L — ABNORMAL HIGH (ref 22–32)
Calcium: 8.2 mg/dL — ABNORMAL LOW (ref 8.9–10.3)
Chloride: 99 mmol/L (ref 98–111)
Creatinine, Ser: 0.71 mg/dL (ref 0.44–1.00)
GFR, Estimated: >60 mL/min (ref >60)
Glucose, Bld: 115 mg/dL — ABNORMAL HIGH (ref 70–99)
Potassium: 2.7 mmol/L — CL (ref 3.5–5.1)
Sodium: 141 mmol/L (ref 135–145)

## 2021-01-04 LAB — CBC
HCT: 41.5 % (ref 36.0–46.0)
Hemoglobin: 13.5 g/dL (ref 12.0–15.0)
MCH: 31 pg (ref 26.0–34.0)
MCHC: 32.5 g/dL (ref 30.0–36.0)
MCV: 95.2 fL (ref 80.0–100.0)
Platelets: 75 10*3/uL — ABNORMAL LOW (ref 150–400)
RBC: 4.36 MIL/uL (ref 3.87–5.11)
RDW: 13.4 % (ref 11.5–15.5)
WBC: 15.8 10*3/uL — ABNORMAL HIGH (ref 4.0–10.5)
nRBC: 0 % (ref 0.0–0.2)

## 2021-01-04 LAB — URINE CULTURE: Culture: >=100000 — AB

## 2021-01-04 LAB — MAGNESIUM: Magnesium: 1.8 mg/dL (ref 1.7–2.4)

## 2021-01-04 LAB — PHOSPHORUS: Phosphorus: 2.4 mg/dL — ABNORMAL LOW (ref 2.5–4.6)

## 2021-01-04 LAB — POTASSIUM: Potassium: 2.9 mmol/L — ABNORMAL LOW (ref 3.5–5.1)

## 2021-01-04 MED ORDER — INFLUENZA VAC A&B SA ADJ QUAD 0.5 ML IM PRSY
0.5000 mL | PREFILLED_SYRINGE | INTRAMUSCULAR | Status: AC
  Administered 2021-01-06: 0.5 mL via INTRAMUSCULAR
  Filled 2021-01-04: qty 0.5

## 2021-01-04 MED ORDER — SODIUM CHLORIDE 0.9 % IV SOLN
1.0000 g | INTRAVENOUS | Status: DC
  Administered 2021-01-04 – 2021-01-06 (×3): 1 g via INTRAVENOUS
  Filled 2021-01-04 (×3): qty 10

## 2021-01-04 MED ORDER — APIXABAN 5 MG PO TABS
5.0000 mg | ORAL_TABLET | Freq: Two times a day (BID) | ORAL | Status: DC
  Administered 2021-01-04 – 2021-01-07 (×7): 5 mg via ORAL
  Filled 2021-01-04 (×7): qty 1

## 2021-01-04 MED ORDER — POTASSIUM CHLORIDE 10 MEQ/100ML IV SOLN
10.0000 meq | INTRAVENOUS | Status: AC
  Administered 2021-01-04 (×4): 10 meq via INTRAVENOUS
  Filled 2021-01-04 (×4): qty 100

## 2021-01-04 MED ORDER — POTASSIUM CHLORIDE CRYS ER 20 MEQ PO TBCR
40.0000 meq | EXTENDED_RELEASE_TABLET | ORAL | Status: AC
  Administered 2021-01-04: 40 meq via ORAL
  Filled 2021-01-04: qty 2

## 2021-01-04 MED ORDER — POTASSIUM CHLORIDE CRYS ER 20 MEQ PO TBCR
40.0000 meq | EXTENDED_RELEASE_TABLET | Freq: Once | ORAL | Status: AC
  Administered 2021-01-04: 40 meq via ORAL
  Filled 2021-01-04: qty 2

## 2021-01-04 MED ORDER — POTASSIUM CHLORIDE CRYS ER 20 MEQ PO TBCR: 40.0000 meq | EXTENDED_RELEASE_TABLET | ORAL | Status: DC

## 2021-01-04 NOTE — Progress Notes (Signed)
2 Days Post-Op Subjective: Patient reports resolution of right flank pain. Afebrile. Urine clear. Patient remains on amiodarone drip for afib. Urine culture grew pansensitive e coli.   Objective: Vital signs in last 24 hours: Temp:  [97.7 F (36.5 C)-98 F (36.7 C)] 97.9 F (36.6 C) (09/14 1133) Pulse Rate:  [67-119] 72 (09/14 1133) Resp:  [15-38] 23 (09/14 1133) BP: (97-162)/(54-93) 155/84 (09/14 0940) SpO2:  [92 %-96 %] 96 % (09/14 1133) Weight:  [66 kg] 66 kg (09/14 0531)  Intake/Output from previous day: 09/13 0701 - 09/14 0700 In: 904 [I.V.:463.4; IV Piggyback:440.6] Out: 2875 [Urine:2875] Intake/Output this shift: Total I/O In: 20 [I.V.:20] Out: -   Physical Exam:  General:alert, cooperative, and appears stated age GI: soft, non tender, normal bowel sounds, no palpable masses, no organomegaly, no inguinal hernia Female genitalia: not done Extremities: extremities normal, atraumatic, no cyanosis or edema  Lab Results: Recent Labs    01/02/21 0057 01/03/21 0531 01/04/21 0442  HGB 16.4* 12.8 13.5  HCT 49.4* 40.4 41.5   BMET Recent Labs    01/03/21 0531 01/04/21 0442 01/04/21 1401  NA 138 141  --   K 3.2* 2.7* 2.9*  CL 101 99  --   CO2 27 33*  --   GLUCOSE 105* 115*  --   BUN 18 16  --   CREATININE 0.83 0.71  --   CALCIUM 7.8* 8.2*  --    Recent Labs    01/02/21 0848  INR 1.4*   No results for input(s): LABURIN in the last 72 hours. Results for orders placed or performed during the hospital encounter of 01/01/21  Urine Culture     Status: Abnormal   Collection Time: 01/01/21 11:00 PM   Specimen: Urine, Clean Catch  Result Value Ref Range Status   Specimen Description   Final    URINE, CLEAN CATCH Performed at [facility], [address] [place], [place] [address]    Special Requests   Final    NONE Performed at [facility], [address] [place], [place] [address]    Culture >=100,000 COLONIES/mL ESCHERICHIA COLI (A)  Final   Report  Status 01/04/2021 FINAL  Final   Organism ID, Bacteria ESCHERICHIA COLI (A)  Final      Susceptibility   Escherichia coli - MIC*    AMPICILLIN <=2 SENSITIVE Sensitive     CEFAZOLIN <=4 SENSITIVE Sensitive     CEFEPIME <=0.12 SENSITIVE Sensitive     CEFTRIAXONE <=0.25 SENSITIVE Sensitive     CIPROFLOXACIN <=0.25 SENSITIVE Sensitive     GENTAMICIN <=1 SENSITIVE Sensitive     IMIPENEM <=0.25 SENSITIVE Sensitive     NITROFURANTOIN <=16 SENSITIVE Sensitive     TRIMETH/SULFA <=20 SENSITIVE Sensitive     AMPICILLIN/SULBACTAM <=2 SENSITIVE Sensitive     PIP/TAZO <=4 SENSITIVE Sensitive     * >=100,000 COLONIES/mL ESCHERICHIA COLI  Resp Panel by RT-PCR (Flu A&B, Covid) Nasopharyngeal Swab     Status: None   Collection Time: 01/02/21  4:17 AM   Specimen: Nasopharyngeal Swab; Nasopharyngeal(NP) swabs in vial transport medium  Result Value Ref Range Status   SARS Coronavirus 2 by RT PCR NEGATIVE NEGATIVE Final    Comment: (NOTE) SARS-CoV-2 target nucleic acids are NOT DETECTED.  The SARS-CoV-2 RNA is generally detectable in upper respiratory specimens during the acute phase of infection. The lowest concentration of SARS-CoV-2 viral copies this assay can detect is 138 copies/mL. A negative result does not preclude SARS-Cov-2 infection and should not be  used as the sole basis for treatment or other patient management decisions. A negative result may occur with  improper specimen collection/handling, submission of specimen other than nasopharyngeal swab, presence of viral mutation(s) within the areas targeted by this assay, and inadequate number of viral copies(<138 copies/mL). A negative result must be combined with clinical observations, patient history, and epidemiological information. The expected result is Negative.  Fact Sheet for Patients:  BloggerCourse.com  Fact Sheet for Healthcare Providers:  SeriousBroker.it  This test is no t  yet approved or cleared by the [place] FDA and  has been authorized for detection and/or diagnosis of SARS-CoV-2 by FDA under an Emergency Use Authorization (EUA). This EUA will remain  in effect (meaning this test can be used) for the duration of the COVID-19 declaration under Section 564(b)(1) of the Act, 21 U.S.C.section 360bbb-3(b)(1), unless the authorization is terminated  or revoked sooner.       Influenza A by PCR NEGATIVE NEGATIVE Final   Influenza B by PCR NEGATIVE NEGATIVE Final    Comment: (NOTE) The Xpert Xpress SARS-CoV-2/FLU/RSV plus assay is intended as an aid in the diagnosis of influenza from Nasopharyngeal swab specimens and should not be used as a sole basis for treatment. Nasal washings and aspirates are unacceptable for Xpert Xpress SARS-CoV-2/FLU/RSV testing.  Fact Sheet for Patients: BloggerCourse.com  Fact Sheet for Healthcare Providers: SeriousBroker.it  This test is not yet approved or cleared by the [place] FDA and has been authorized for detection and/or diagnosis of SARS-CoV-2 by FDA under an Emergency Use Authorization (EUA). This EUA will remain in effect (meaning this test can be used) for the duration of the COVID-19 declaration under Section 564(b)(1) of the Act, 21 U.S.C. section 360bbb-3(b)(1), unless the authorization is terminated or revoked.  Performed at [facility], [address], [place], [place] [address]   Culture, blood (single)     Status: None (Preliminary result)   Collection Time: 01/02/21  4:46 AM   Specimen: Left Antecubital; Blood  Result Value Ref Range Status   Specimen Description LEFT ANTECUBITAL  Final   Special Requests   Final    BOTTLES DRAWN AEROBIC AND ANAEROBIC Blood Culture adequate volume   Culture   Final    NO GROWTH 2 DAYS Performed at [facility], [address], [place], [place] [address]    Report Status PENDING  Incomplete  MRSA Next Gen  by PCR, Nasal     Status: None   Collection Time: 01/02/21  3:46 PM   Specimen: Nasal Mucosa; Nasal Swab  Result Value Ref Range Status   MRSA by PCR Next Gen NOT DETECTED NOT DETECTED Final    Comment: (NOTE) The GeneXpert MRSA Assay (FDA approved for NASAL specimens only), is one component of a comprehensive MRSA colonization surveillance program. It is not intended to diagnose MRSA infection nor to guide or monitor treatment for MRSA infections. Test performance is not FDA approved in patients less than 37 years old. Performed at [facility], [address] [place], [place] [address]   C Difficile Quick Screen w PCR reflex     Status: None   Collection Time: 01/03/21 12:55 PM   Specimen: STOOL  Result Value Ref Range Status   C Diff antigen NEGATIVE NEGATIVE Final   C Diff toxin NEGATIVE NEGATIVE Final   C Diff interpretation No C. difficile detected.  Final    Comment: Performed at [facility], [address], [place], [place] [address]    Studies/Results: ECHOCARDIOGRAM  COMPLETE  Result Date: 01/03/2021    ECHOCARDIOGRAM REPORT   Patient Name:   Railyn A Venters Date of Exam: 01/03/2021 Medical Rec #:  [id number]       Height:       63.0 in Accession #:    [id number]      Weight:       137.8 lb Date of Birth:  1928-10-29        BSA:          1.650 m Patient Age:    87 years        BP:           148/75 mmHg Patient Gender: F               HR:           79 bpm. [facility] Procedure: 2D Echo, Color Doppler and Cardiac Doppler Indications:    Atrial Fibrillation  History:        Patient has prior history of Echocardiogram examinations, most                 recent 12/22/2018. CHF, CAD, Arrythmias:Atrial Fibrillation,                 Signs/Symptoms:Dyspnea; Risk Factors:Hypertension and                 Dyslipidemia. Ascending aortic aneurysm.  Sonographer:    Mikki Harbor Referring Phys: [id number] SAMUEL G MCDOWELL IMPRESSIONS  1. Left ventricular ejection fraction, by estimation,  is 60 to 65%. The left ventricle has normal function. The left ventricle has no regional wall motion abnormalities. Left ventricular diastolic parameters were normal.  2. Right ventricular systolic function is normal. The right ventricular size is mildly enlarged. There is severely elevated pulmonary artery systolic pressure. The estimated right ventricular systolic pressure is 63.4 mmHg.  3. Left atrial size was mildly dilated.  4. Right atrial size was mildly dilated.  5. The mitral valve is degenerative. Mild to moderate mitral valve regurgitation with very eccentric jet.  6. Tricuspid valve regurgitation is moderate to severe.  7. The aortic valve is tricuspid. There is mild calcification of the aortic valve. Aortic valve regurgitation is mild. Mild to moderate aortic valve sclerosis/calcification is present, without any evidence of aortic stenosis. Aortic valve mean gradient measures 7.0 mmHg.  8. Aortic dilatation noted. There is moderate dilatation of the ascending aorta, measuring 42 mm.  9. The inferior vena cava is dilated in size with <50% respiratory variability, suggesting right atrial pressure of 15 mmHg. Comparison(s): Prior images reviewed side by side. Degree of tricuspid regurgitation and elevated RVSP more prominent in comparison. FINDINGS  Left Ventricle: Left ventricular ejection fraction, by estimation, is 60 to 65%. The left ventricle has normal function. The left ventricle has no regional wall motion abnormalities. The left ventricular internal cavity size was normal in size. There is  borderline left ventricular hypertrophy. Left ventricular diastolic parameters were normal. Right Ventricle: The right ventricular size is mildly enlarged. No increase in right ventricular wall thickness. Right ventricular systolic function is normal. There is severely elevated pulmonary artery systolic pressure. The tricuspid regurgitant velocity is 3.48 m/s, and with an assumed right atrial pressure of 15  mmHg, the estimated right ventricular systolic pressure is 63.4 mmHg. Left Atrium: Left atrial size was mildly dilated. Right Atrium: Right atrial size was mildly dilated. Pericardium: There is no evidence of pericardial effusion. Mitral Valve: The mitral valve is degenerative  in appearance. There is mild calcification of the mitral valve leaflet(s). Mild mitral annular calcification. Mild to moderate mitral valve regurgitation. No evidence of mitral valve stenosis. MV peak gradient, 5.0 mmHg. The mean mitral valve gradient is 2.0 mmHg. Tricuspid Valve: The tricuspid valve is grossly normal. Tricuspid valve regurgitation is moderate to severe. Aortic Valve: The aortic valve is tricuspid. There is mild calcification of the aortic valve. There is mild aortic valve annular calcification. Aortic valve regurgitation is mild. Aortic regurgitation PHT measures 312 msec. Mild to moderate aortic valve sclerosis/calcification is present, without any evidence of aortic stenosis. Aortic valve mean gradient measures 7.0 mmHg. Aortic valve peak gradient measures 11.6 mmHg. Aortic valve area, by VTI measures 1.78 cm. Pulmonic Valve: The pulmonic valve was grossly normal. Pulmonic valve regurgitation is mild. Aorta: The aortic root is normal in size and structure and aortic dilatation noted. There is moderate dilatation of the ascending aorta, measuring 42 mm. Venous: The inferior vena cava is dilated in size with less than 50% respiratory variability, suggesting right atrial pressure of 15 mmHg. IAS/Shunts: No atrial level shunt detected by color flow Doppler.  LEFT VENTRICLE PLAX 2D LVIDd:         4.48 cm     Diastology LVIDs:         2.64 cm     LV e' medial:    11.90 cm/s LV PW:         0.90 cm     LV E/e' medial:  7.1 LV IVS:        1.07 cm     LV e' lateral:   13.10 cm/s LVOT diam:     1.90 cm     LV E/e' lateral: 6.5 LV SV:         63 LV SV Index:   38 LVOT Area:     2.84 cm  LV Volumes (MOD) LV vol d, MOD A2C: 48.9 ml LV  vol d, MOD A4C: 47.2 ml LV vol s, MOD A2C: 19.9 ml LV vol s, MOD A4C: 19.4 ml LV SV MOD A2C:     29.0 ml LV SV MOD A4C:     47.2 ml LV SV MOD BP:      28.3 ml RIGHT VENTRICLE RV Basal diam:  4.14 cm RV Mid diam:    3.78 cm RV S prime:     16.30 cm/s TAPSE (M-mode): 2.0 cm LEFT ATRIUM             Index       RIGHT ATRIUM           Index LA diam:        3.80 cm 2.30 cm/m  RA Area:     18.90 cm LA Vol (A2C):   56.9 ml 34.48 ml/m RA Volume:   56.40 ml  34.17 ml/m LA Vol (A4C):   60.8 ml 36.84 ml/m LA Biplane Vol: 60.3 ml 36.54 ml/m  AORTIC VALVE AV Area (Vmax):    1.77 cm AV Area (Vmean):   1.62 cm AV Area (VTI):     1.78 cm AV Vmax:           170.00 cm/s AV Vmean:          125.000 cm/s AV VTI:            0.352 m AV Peak Grad:      11.6 mmHg AV Mean Grad:      7.0 mmHg LVOT Vmax:  106.00 cm/s LVOT Vmean:        71.300 cm/s LVOT VTI:          0.221 m LVOT/AV VTI ratio: 0.63 AI PHT:            312 msec  AORTA Ao Root diam: 3.20 cm Ao Asc diam:  4.20 cm MITRAL VALVE               TRICUSPID VALVE MV Area (PHT): 3.63 cm    TR Peak grad:   48.4 mmHg MV Area VTI:   2.41 cm    TR Vmax:        348.00 cm/s MV Peak grad:  5.0 mmHg MV Mean grad:  2.0 mmHg    SHUNTS MV Vmax:       1.12 m/s    Systemic VTI:  0.22 m MV Vmean:      63.9 cm/s   Systemic Diam: 1.90 cm MV Decel Time: 209 msec MV E velocity: 85.00 cm/s MV A velocity: 98.70 cm/s MV E/A ratio:  0.86 Nona Dell MD Electronically signed by Nona Dell MD Signature Date/Time: 01/03/2021/11:38:28 AM    Final     Assessment/Plan: POD#2 ureteral stent placement for sepsis from a urinary source.  Please continue rocephin Please continue indwelling foley  3. Urology to continue to follow   LOS: 2 days   Wilkie Aye 01/04/2021, 2:57 PM

## 2021-01-04 NOTE — Addendum Note (Signed)
Addendum  created 01/04/21 0744 by Junious Silk, CRNA   [facility] Staff edited

## 2021-01-04 NOTE — Evaluation (Signed)
Physical Therapy Evaluation Patient Details Name: [name] MRN: [id number] DOB: [birth year] Today's Date: 01/04/2021  History of Present Illness  [name] is a 87 y.o. female presents with R side badominal and flank pain. CT shows 7 mm distal ureteral stone; s/p R uretal stent placement 9/12. PMH: AAA, CHF, HTN, afib, thrombocytopenia, trigeminal neuralgia   Clinical Impression  Pt admitted with above diagnosis. Pt independent at baseline with children checking on her and living on same property, has neighbor who completes yardwork and some house cleaning for pt. Pt currently requiring min guard-assist with mobility around hospital room, slightly unsteady at first improving with time. Pt desats to 88% on 1L O2 but improves to 95% when seated resting- RN aware. Recommend OPPT for strengthening; pt states family able to stay 24/7 for initial days home if needed. Pt currently with functional limitations due to the deficits listed below (see PT Problem List). Pt will benefit from skilled PT to increase their independence and safety with mobility to allow discharge to the venue listed below.          Recommendations for follow up therapy are one component of a multi-disciplinary discharge planning process, led by the attending physician.  Recommendations may be updated based on patient status, additional functional criteria and insurance authorization.  Follow Up Recommendations Outpatient PT;Supervision - Intermittent    Equipment Recommendations  None recommended by PT    Recommendations for Other Services       Precautions / Restrictions Precautions Precautions: Fall Precaution Comments: monitor O2, foley catheter Restrictions Weight Bearing Restrictions: No      Mobility  Bed Mobility Overal bed mobility: Needs Assistance Bed Mobility: Supine to Sit  Supine to sit: Min assist  General bed mobility comments: slow to mobilize to EOB due to mattress, min A to ultimately  upright trunk and scoot out to EOB    Transfers Overall transfer level: Needs assistance Equipment used: Rolling walker (2 wheeled);1 person hand held assist Transfers: Sit to/from [employer] Sit to Stand: Min guard Stand pivot transfers: Min assist  General transfer comment: min guard to power to stand, BLE braced against bed and BUE assisting to power up; min A to pivot to [facility] with HHA, therapist managing lines  Ambulation/Gait Ambulation/Gait assistance: Min guard;Min assist Gait Distance (Feet): 15 Feet (x2) Assistive device: Rolling walker (2 wheeled) Gait Pattern/deviations: Step-through pattern;Decreased stride length;Trunk flexed Gait velocity: decreased   General Gait Details: step through gait pattern with RW, trunk flexed without LOB, initial steps slightly unsteady but improves with distance, on 1L O2 with SpO2 88-90% while ambulating  Stairs            Wheelchair Mobility    Modified Rankin (Stroke Patients Only)       Balance Overall balance assessment: Needs assistance Sitting-balance support: Feet supported Sitting balance-Leahy Scale: Good Sitting balance - Comments: seated EOB   Standing balance support: During functional activity;Bilateral upper extremity supported Standing balance-Leahy Scale: Poor Standing balance comment: reliant on UE support       Pertinent Vitals/Pain Pain Assessment: No/denies pain    Home Living Family/patient expects to be discharged to:: Private residence Living Arrangements: Alone Available Help at Discharge: Family;Neighbor;Available 24 hours/day (daughter, son, and neighbor all on pt's farm, assist with outdoor chores and can assist pt 24/7 if needed) Type of Home: House Home Access: Stairs to enter Entrance Stairs-Rails: Right Entrance Stairs-Number of Steps: 3 Home Layout: Two level;Able to live on main level with  bedroom/bathroom Home Equipment: Walker - 2 wheels;Walker - 4 wheels;Cane -  single point      Prior Function Level of Independence: Independent with assistive device(s)  Comments: Pt reports being independent with SPC/RW or furniture walking, independent with ADLs, has rollator for outside as well. Pt reports family provides transportation, groceries, cooks meals 2-3x/wk.     Hand Dominance   Dominant Hand: Right    Extremity/Trunk Assessment   Upper Extremity Assessment Upper Extremity Assessment: Overall WFL for tasks assessed    Lower Extremity Assessment Lower Extremity Assessment: Generalized weakness (AROM WNL, strength grossly 4-/5, denies numbness/tingling)    Cervical / Trunk Assessment Cervical / Trunk Assessment: Kyphotic  Communication   Communication: No difficulties  Cognition Arousal/Alertness: Awake/alert Behavior During Therapy: WFL for tasks assessed/performed Overall Cognitive Status: Within Functional Limits for tasks assessed  General Comments: Pt reports hallucinating when dozes off, but was told by RN it's from the medication; a&o x4      General Comments General comments (skin integrity, edema, etc.): 1L O2 with SpO2 88-90% while ambulating, improves to 95% once seated in recliner resting    Exercises     Assessment/Plan    PT Assessment Patient needs continued PT services  PT Problem List Decreased strength;Decreased activity tolerance;Decreased balance;Decreased mobility;Cardiopulmonary status limiting activity       PT Treatment Interventions DME instruction;Gait training;Stair training;Functional mobility training;Therapeutic activities;Therapeutic exercise;Balance training;Patient/family education    PT Goals (Current goals can be found in the Care Plan section)  Acute Rehab PT Goals Patient Stated Goal: "outpatient PT" PT Goal Formulation: With patient Time For Goal Achievement: 01/18/21 Potential to Achieve Goals: Good    Frequency Min 3X/week   Barriers to discharge        Co-evaluation                AM-PAC PT "6 Clicks" Mobility  Outcome Measure Help needed turning from your back to your side while in a flat bed without using bedrails?: A Little Help needed moving from lying on your back to sitting on the side of a flat bed without using bedrails?: A Little Help needed moving to and from a bed to a chair (including a wheelchair)?: A Little Help needed standing up from a chair using your arms (e.g., wheelchair or bedside chair)?: A Little Help needed to walk in hospital room?: A Little Help needed climbing 3-5 steps with a railing? : A Little 6 Click Score: 18    End of Session Equipment Utilized During Treatment: Gait belt;Oxygen Activity Tolerance: Patient tolerated treatment well Patient left: in chair;with call bell/phone within reach Nurse Communication: Mobility status;Other (comment) (SpO2) PT Visit Diagnosis: Other abnormalities of gait and mobility (R26.89);Muscle weakness (generalized) (M62.81)    Time: 0017-4944 PT Time Calculation (min) (ACUTE ONLY): 46 min   Charges:   PT Evaluation $PT Eval Low Complexity: 1 Low PT Treatments $Gait Training: 8-22 mins $Therapeutic Activity: 8-22 mins         Tori Kamie Korber PT, DPT 01/04/21, 4:20 PM

## 2021-01-04 NOTE — Progress Notes (Signed)
Progress Note  Patient Name: [name] Date of Encounter: 01/04/2021  Primary Cardiologist: Nona Dell, MD  Subjective   No chest pain this morning.  Has had breakthrough rapid atrial fibrillation.  Inpatient Medications    Scheduled Meds:  Chlorhexidine Gluconate Cloth  6 each Topical Daily   metoprolol succinate  25 mg Oral Daily   pantoprazole  40 mg Oral Daily   phosphorus  500 mg Oral BID   potassium chloride  40 mEq Oral Q4H   pravastatin  40 mg Oral QHS   sodium chloride flush  10-40 mL Intracatheter Q12H   Continuous Infusions:  amiodarone 30 mg/hr (01/04/21 0649)   cefTRIAXone (ROCEPHIN)  IV     phenylephrine (NEO-SYNEPHRINE) Adult infusion Stopped (01/02/21 1136)   PRN Meds: acetaminophen, HYDROmorphone (DILAUDID) injection, sodium chloride flush   Vital Signs    Vitals:   01/04/21 0800 01/04/21 0802 01/04/21 0803 01/04/21 0806  BP: (!) 136/91     Pulse:    76  Resp:  15    Temp:      TempSrc:      SpO2: 93%  94%   Weight:      Height:        Intake/Output Summary (Last 24 hours) at 01/04/2021 0854 Last data filed at 01/04/2021 0649 Gross per 24 hour  Intake 745.41 ml  Output 2875 ml  Net -2129.59 ml   Filed Weights   01/01/21 2245 01/04/21 0531  Weight: 62.5 kg 66 kg    Telemetry    Sinus rhythm with breakthrough rapid atrial fibrillation noted in the last 24 hours.  Personally reviewed.  ECG    An ECG dated 01/03/2021 was personally reviewed today and demonstrated:  Sinus rhythm with PAC, low voltage.  Physical Exam   GEN: Elderly woman, no acute distress.   Neck: No JVD. Cardiac: RRR, no gallop.  Respiratory: Nonlabored. Clear to auscultation bilaterally. GI: Soft, nontender, bowel sounds present. MS: No edema; No deformity. Neuro:  Nonfocal. Psych: Alert and oriented x 3. Normal affect.  Labs    Chemistry Recent Labs  Lab 01/02/21 0057 01/03/21 0531 01/04/21 0442  NA 137 138 141  K 3.7 3.2* 2.7*  CL 95* 101  99  CO2 27 27 33*  GLUCOSE 158* 105* 115*  BUN 17 18 16   CREATININE 0.91 0.83 0.71  CALCIUM 9.8 7.8* 8.2*  PROT  --  5.5*  --   ALBUMIN  --  2.9*  --   AST  --  21  --   ALT  --  20  --   ALKPHOS  --  49  --   BILITOT  --  0.9  --   GFRNONAA 60* >60 >60  ANIONGAP 15 10 9      Hematology Recent Labs  Lab 01/02/21 0057 01/03/21 0531 01/04/21 0442  WBC 9.2 19.6* 15.8*  RBC 5.29* 4.19 4.36  HGB 16.4* 12.8 13.5  HCT 49.4* 40.4 41.5  MCV 93.4 96.4 95.2  MCH 31.0 30.5 31.0  MCHC 33.2 31.7 32.5  RDW 12.8 13.4 13.4  PLT 143* 76* 75*    Radiology    DG CHEST PORT 1 VIEW  Result Date: 01/02/2021 CLINICAL DATA:  PICC line placement EXAM: PORTABLE CHEST 1 VIEW COMPARISON:  08/27/2020 FINDINGS: The heart is enlarged. There is atherosclerosis and tortuosity of the aorta. There is interstitial pulmonary edema and bilateral pleural effusions and dependent atelectasis, left more than right. Basilar pneumonia not excluded. Right arm PICC tip  extends to the SVC above the right atrium. Monitor lead overlies the distal catheter in the exact location of the tip can not be established. IMPRESSION: Congestive heart failure pattern. Effusions and basilar atelectasis left worse than right. Right arm PICC tip is at least to the level of the SVC above the right atrium. The exact termination can not be established because of an overlying monitor lead. Electronically Signed   By: Paulina Fusi M.D.   On: 01/02/2021 13:03   DG C-Arm 1-60 Min-No Report  Result Date: 01/02/2021 Fluoroscopy was utilized by the requesting physician.  No radiographic interpretation.   ECHOCARDIOGRAM COMPLETE  Result Date: 01/03/2021    ECHOCARDIOGRAM REPORT   Patient Name:   [name] Date of Exam: 01/03/2021 Medical Rec #:  [id number]       Height:       63.0 in Accession #:    [id number]      Weight:       137.8 lb Date of Birth:  09-Aug-1928        BSA:          1.650 m Patient Age:    87 years        BP:           148/75  mmHg Patient Gender: F               HR:           79 bpm. [facility] Procedure: 2D Echo, Color Doppler and Cardiac Doppler Indications:    Atrial Fibrillation  History:        Patient has prior history of Echocardiogram examinations, most                 recent 12/22/2018. CHF, CAD, Arrythmias:Atrial Fibrillation,                 Signs/Symptoms:Dyspnea; Risk Factors:Hypertension and                 Dyslipidemia. Ascending aortic aneurysm.  Sonographer:    Mikki Harbor Referring Phys: 1 Evangeline Utley G Dalia Jollie IMPRESSIONS  1. Left ventricular ejection fraction, by estimation, is 60 to 65%. The left ventricle has normal function. The left ventricle has no regional wall motion abnormalities. Left ventricular diastolic parameters were normal.  2. Right ventricular systolic function is normal. The right ventricular size is mildly enlarged. There is severely elevated pulmonary artery systolic pressure. The estimated right ventricular systolic pressure is 63.4 mmHg.  3. Left atrial size was mildly dilated.  4. Right atrial size was mildly dilated.  5. The mitral valve is degenerative. Mild to moderate mitral valve regurgitation with very eccentric jet.  6. Tricuspid valve regurgitation is moderate to severe.  7. The aortic valve is tricuspid. There is mild calcification of the aortic valve. Aortic valve regurgitation is mild. Mild to moderate aortic valve sclerosis/calcification is present, without any evidence of aortic stenosis. Aortic valve mean gradient measures 7.0 mmHg.  8. Aortic dilatation noted. There is moderate dilatation of the ascending aorta, measuring 42 mm.  9. The inferior vena cava is dilated in size with <50% respiratory variability, suggesting right atrial pressure of 15 mmHg. Comparison(s): Prior images reviewed side by side. Degree of tricuspid regurgitation and elevated RVSP more prominent in comparison. FINDINGS  Left Ventricle: Left ventricular ejection fraction, by estimation, is  60 to 65%. The left ventricle has normal function. The left ventricle has no regional wall motion abnormalities. The left ventricular  internal cavity size was normal in size. There is  borderline left ventricular hypertrophy. Left ventricular diastolic parameters were normal. Right Ventricle: The right ventricular size is mildly enlarged. No increase in right ventricular wall thickness. Right ventricular systolic function is normal. There is severely elevated pulmonary artery systolic pressure. The tricuspid regurgitant velocity is 3.48 m/s, and with an assumed right atrial pressure of 15 mmHg, the estimated right ventricular systolic pressure is 63.4 mmHg. Left Atrium: Left atrial size was mildly dilated. Right Atrium: Right atrial size was mildly dilated. Pericardium: There is no evidence of pericardial effusion. Mitral Valve: The mitral valve is degenerative in appearance. There is mild calcification of the mitral valve leaflet(s). Mild mitral annular calcification. Mild to moderate mitral valve regurgitation. No evidence of mitral valve stenosis. MV peak gradient, 5.0 mmHg. The mean mitral valve gradient is 2.0 mmHg. Tricuspid Valve: The tricuspid valve is grossly normal. Tricuspid valve regurgitation is moderate to severe. Aortic Valve: The aortic valve is tricuspid. There is mild calcification of the aortic valve. There is mild aortic valve annular calcification. Aortic valve regurgitation is mild. Aortic regurgitation PHT measures 312 msec. Mild to moderate aortic valve sclerosis/calcification is present, without any evidence of aortic stenosis. Aortic valve mean gradient measures 7.0 mmHg. Aortic valve peak gradient measures 11.6 mmHg. Aortic valve area, by VTI measures 1.78 cm. Pulmonic Valve: The pulmonic valve was grossly normal. Pulmonic valve regurgitation is mild. Aorta: The aortic root is normal in size and structure and aortic dilatation noted. There is moderate dilatation of the ascending aorta,  measuring 42 mm. Venous: The inferior vena cava is dilated in size with less than 50% respiratory variability, suggesting right atrial pressure of 15 mmHg. IAS/Shunts: No atrial level shunt detected by color flow Doppler.  LEFT VENTRICLE PLAX 2D LVIDd:         4.48 cm     Diastology LVIDs:         2.64 cm     LV e' medial:    11.90 cm/s LV PW:         0.90 cm     LV E/e' medial:  7.1 LV IVS:        1.07 cm     LV e' lateral:   13.10 cm/s LVOT diam:     1.90 cm     LV E/e' lateral: 6.5 LV SV:         63 LV SV Index:   38 LVOT Area:     2.84 cm  LV Volumes (MOD) LV vol d, MOD A2C: 48.9 ml LV vol d, MOD A4C: 47.2 ml LV vol s, MOD A2C: 19.9 ml LV vol s, MOD A4C: 19.4 ml LV SV MOD A2C:     29.0 ml LV SV MOD A4C:     47.2 ml LV SV MOD BP:      28.3 ml RIGHT VENTRICLE RV Basal diam:  4.14 cm RV Mid diam:    3.78 cm RV S prime:     16.30 cm/s TAPSE (M-mode): 2.0 cm LEFT ATRIUM             Index       RIGHT ATRIUM           Index LA diam:        3.80 cm 2.30 cm/m  RA Area:     18.90 cm LA Vol (A2C):   56.9 ml 34.48 ml/m RA Volume:   56.40 ml  34.17 ml/m LA Vol (A4C):  60.8 ml 36.84 ml/m LA Biplane Vol: 60.3 ml 36.54 ml/m  AORTIC VALVE AV Area (Vmax):    1.77 cm AV Area (Vmean):   1.62 cm AV Area (VTI):     1.78 cm AV Vmax:           170.00 cm/s AV Vmean:          125.000 cm/s AV VTI:            0.352 m AV Peak Grad:      11.6 mmHg AV Mean Grad:      7.0 mmHg LVOT Vmax:         106.00 cm/s LVOT Vmean:        71.300 cm/s LVOT VTI:          0.221 m LVOT/AV VTI ratio: 0.63 AI PHT:            312 msec  AORTA Ao Root diam: 3.20 cm Ao Asc diam:  4.20 cm MITRAL VALVE               TRICUSPID VALVE MV Area (PHT): 3.63 cm    TR Peak grad:   48.4 mmHg MV Area VTI:   2.41 cm    TR Vmax:        348.00 cm/s MV Peak grad:  5.0 mmHg MV Mean grad:  2.0 mmHg    SHUNTS MV Vmax:       1.12 m/s    Systemic VTI:  0.22 m MV Vmean:      63.9 cm/s   Systemic Diam: 1.90 cm MV Decel Time: 209 msec MV E velocity: 85.00 cm/s MV A velocity:  98.70 cm/s MV E/A ratio:  0.86 Nona Dell MD Electronically signed by Nona Dell MD Signature Date/Time: 01/03/2021/11:38:28 AM    Final    [place] EKG SITE RITE  Result Date: 01/02/2021 If Site Rite image not attached, placement could not be confirmed due to current cardiac rhythm.    Assessment & Plan    1.  Paroxysmal atrial fibrillation with CHA2DS2-VASc score of 6.  She is in sinus rhythm this morning but has had breakthrough rapid atrial fibrillation in the last 24 hours.  She continues on IV amiodarone with resumption of Toprol-XL yesterday as well. Also off Eliquis temporarily for urological procedure.  2.  Sepsis with UTI and pyelonephritis, also with right ureteral stone.  She is status post right ureteral stent placement yesterday.  She remains on antibiotics.  Urine culture positive for E. coli, blood cultures without growth.  Leukocytosis improving.  3.  Hypokalemia, being repleted by primary team.  Would continue IV amiodarone for now given recurrent atrial fibrillation and plan to switch to oral dose at discharge rather than resuming Cardizem CD.  Continue present dose of Toprol-XL.  Eliquis can be resumed when cleared by urology.  Replete potassium.  Signed, Nona Dell, MD  01/04/2021, 8:54 AM

## 2021-01-04 NOTE — Plan of Care (Signed)
  Problem: Acute Rehab PT Goals(only PT should resolve) Goal: Pt Will Go Supine/Side To Sit Outcome: Progressing Flowsheets (Taken 01/04/2021 1623) Pt will go Supine/Side to Sit: with supervision Goal: Patient Will Transfer Sit To/From Stand Outcome: Progressing Flowsheets (Taken 01/04/2021 1623) Patient will transfer sit to/from stand: with supervision Goal: Pt Will Ambulate Outcome: Progressing Flowsheets (Taken 01/04/2021 1623) Pt will Ambulate:  100 feet  with min guard assist  with rolling walker   Tori Berdella Bacot PT, DPT 01/04/21, 4:24 PM

## 2021-01-04 NOTE — Progress Notes (Signed)
PROGRESS NOTE  PAZ FUENTES MWU:[id number] DOB: February 25, 1929 DOA: 01/01/2021 PCP: Benita Stabile, MD   Brief History:  87 year old female with a history of paroxysmal atrial fibrillation, diastolic CHF, hypertension, hyperlipidemia, coronary arterydisease, ascending aortic aneurysm, and nephrolithiasis status post lithotripsy May 2018 presenting with right-sided abdominal pain/right flank pain that began around 1 PM on 01/01/2021.  The patient had been in her usual state of health prior to the beginning of her episodes.  She has some nausea without emesis.  She has some subjective fevers and chills.  She denies any headache, neck pain, chest pain, shortness breath, coughing, hemoptysis, diarrhea, hematochezia, melena. In the emergency department, the patient was febrile up to 101.3 F with tachycardia 120.  She was hemodynamically stable with oxygen saturation 100% on room air.  BMP shows sodium 137, potassium 3.7, serum creatinine of 0.91.  WBC 9.2, hemoglobin 16.4, platelets143K.  Urology was consulted, and there are plans for Dr. Ronne Binning to see the patient for possible ureteral stent placement.   Assessment/Plan: Severe Sepsis secondary to E. coli -due to UTI -Sepsis present at time of admission with fever, tachycardia, positive source of infection and elevated lactic acid.  -Continue IV fluids>>saline lock -Continue ceftriaxone -lactate peaked 4.6   Pyelonephritis/ureteral stone -Urology consulted; appreciate recommendations -01/01/21 CT renal--7 mm distal right ureteral stone with right perinephric stranding and right hydronephrosis -Based on culture results continue Rocephin. -d/c vanco -Continue to maintain adequate hydration -Per urology service okay to resume anticoagulation and once able to stand attempt voiding trial and Foley catheter removal.  Paroxysmal Atrial Fibrillation with RVR -metoprolol and diltiazem were held initially due to soft BPs and npo for  surgery -started on amiodarone drip>>converted to sinus and currently maintaining her rate control -appreciate cardiology following and recommendations. -continue IV Amiodarone current dose of metoprolol -Restarting apixaban  Acute Respiratory Failure with Hypoxia -due to pulmonary edema -stable on 2L and denying sob -will attempt to wean off. -Lasix 40 mg IV x 1 -01/03/21 Echo--EF 60-65%, no WMA, severe elevated PASP; mod-severe TR -personally reviewed CXR--increased interstitial markings   Thrombocytopenia -This has been chronic in the past dating back to April 2018 -Monitor for signs of bleeding   Acute on Chronic diastolic CHF -Appears clinically euvolemic currently -12/22/2018 echo EF >65%; mild MR, no WMA -01/03/21 Echo--EF 60-65%, no WMA, severe elevated PASP; mod-severe TR -lasix 40 mg IV x 1 due to vascular congestion as mentioned above. -Continue to follow daily weights and strict I's and O's.   Coronary artery disease -No chest pain -Personally reviewed EKG--- sinus rhythm, no concerning ST-T wave change   Ascending aortic aneurysm -Measuring 5.2 cm on last imaging -Patient follows Dr. Emi Holes to be a poor surgical candidate -Continue outpatient follow-up.   Hyperlipidemia -Continue statin  Hypomagnesemia/Hypophosphatemia/Hypokalemia -Continue to follow electrolytes and further replete as needed. -Goal is for potassium above 4 and magnesium above 2.    Status is: Inpatient   Remains inpatient appropriate because:Inpatient level of care appropriate due to severity of illness   Dispo: The patient is from: Home              Anticipated d/c is to: Home              Patient currently is not medically stable to d/c.              Difficult to place patient No         Family  Communication:   son updated on 9/14   Consultants:  urology   Code Status:  FULL    DVT Prophylaxis:  apixaban     Procedures: As Listed in Progress Note Above    Antibiotics: Ceftriaxone 9/11>>      Subjective: No fever, no chest pain, no nausea, no vomiting.  Reports significant improvement in her breathing and is in no acute distress currently.  Transient episode of A. fib with RVR self terminated while actively receiving IV amiodarone.  Objective: Vitals:   01/04/21 0802 01/04/21 0803 01/04/21 0806 01/04/21 0940  BP:    (!) 155/84  Pulse:   76 77  Resp: 15     Temp:      TempSrc:      SpO2:  94%    Weight:      Height:        Intake/Output Summary (Last 24 hours) at 01/04/2021 1029 Last data filed at 01/04/2021 0944 Gross per 24 hour  Intake 745.41 ml  Output 2875 ml  Net -2129.59 ml   Weight change:   Exam: General exam: Alert, awake, oriented x 3; no chest pain, no nausea, no vomiting.  Reports good urine output.  Shortness of breath significantly improved. Respiratory system: Positive rhonchi bilaterally; no using accessory muscle.  Good saturation on 2 L supplementation. Cardiovascular system: Rate controlled, no rubs, no gallops, no JVD on exam. Gastrointestinal system/GU: Abdomen is nondistended, soft and nontender. No organomegaly or masses felt. Normal bowel sounds heard.  Foley catheter in place Central nervous system: Alert and oriented. No focal neurological deficits. Extremities: No cyanosis or clubbing. Skin: No petechiae. Psychiatry: Judgement and insight appear normal. Mood & affect appropriate.    Data Reviewed: I have personally reviewed following labs and imaging studies  Basic Metabolic Panel: Recent Labs  Lab 01/02/21 0057 01/02/21 0848 01/03/21 0531 01/04/21 0442  NA 137  --  138 141  K 3.7  --  3.2* 2.7*  CL 95*  --  101 99  CO2 27  --  27 33*  GLUCOSE 158*  --  105* 115*  BUN 17  --  18 16  CREATININE 0.91  --  0.83 0.71  CALCIUM 9.8  --  7.8* 8.2*  MG  --  1.4*  --  1.8  PHOS  --  2.2*  --  2.4*   Liver Function Tests: Recent Labs  Lab 01/03/21 0531  AST 21  ALT 20  ALKPHOS 49   BILITOT 0.9  PROT 5.5*  ALBUMIN 2.9*    Coagulation Profile: Recent Labs  Lab 01/02/21 0848  INR 1.4*   CBC: Recent Labs  Lab 01/02/21 0057 01/03/21 0531 01/04/21 0442  WBC 9.2 19.6* 15.8*  NEUTROABS 8.5*  --   --   HGB 16.4* 12.8 13.5  HCT 49.4* 40.4 41.5  MCV 93.4 96.4 95.2  PLT 143* 76* 75*    Urine analysis:    Component Value Date/Time   COLORURINE YELLOW 01/01/2021 2300   APPEARANCEUR HAZY (A) 01/01/2021 2300   APPEARANCEUR Hazy (A) 11/15/2020 0944   LABSPEC 1.025 01/01/2021 2300   PHURINE 6.0 01/01/2021 2300   GLUCOSEU NEGATIVE 01/01/2021 2300   HGBUR LARGE (A) 01/01/2021 2300   BILIRUBINUR NEGATIVE 01/01/2021 2300   BILIRUBINUR Negative 11/15/2020 0944   KETONESUR >80 (A) 01/01/2021 2300   PROTEINUR 30 (A) 01/01/2021 2300   UROBILINOGEN negative (A) 05/19/2019 0844   NITRITE POSITIVE (A) 01/01/2021 2300   LEUKOCYTESUR SMALL (A) 01/01/2021 2300  Sepsis Labs:  Recent Results (from the past 240 hour(s))  Urine Culture     Status: Abnormal   Collection Time: 01/01/21 11:00 PM   Specimen: Urine, Clean Catch  Result Value Ref Range Status   Specimen Description   Final    URINE, CLEAN CATCH Performed at [facility], [address], [place], [place] [address]    Special Requests   Final    NONE Performed at [facility], [address], [place], [place] [address]    Culture >=100,000 COLONIES/mL ESCHERICHIA COLI (A)  Final   Report Status 01/04/2021 FINAL  Final   Organism ID, Bacteria ESCHERICHIA COLI (A)  Final      Susceptibility   Escherichia coli - MIC*    AMPICILLIN <=2 SENSITIVE Sensitive     CEFAZOLIN <=4 SENSITIVE Sensitive     CEFEPIME <=0.12 SENSITIVE Sensitive     CEFTRIAXONE <=0.25 SENSITIVE Sensitive     CIPROFLOXACIN <=0.25 SENSITIVE Sensitive     GENTAMICIN <=1 SENSITIVE Sensitive     IMIPENEM <=0.25 SENSITIVE Sensitive     NITROFURANTOIN <=16 SENSITIVE Sensitive     TRIMETH/SULFA <=20 SENSITIVE Sensitive      AMPICILLIN/SULBACTAM <=2 SENSITIVE Sensitive     PIP/TAZO <=4 SENSITIVE Sensitive     * >=100,000 COLONIES/mL ESCHERICHIA COLI  Resp Panel by RT-PCR (Flu A&B, Covid) Nasopharyngeal Swab     Status: None   Collection Time: 01/02/21  4:17 AM   Specimen: Nasopharyngeal Swab; Nasopharyngeal(NP) swabs in vial transport medium  Result Value Ref Range Status   SARS Coronavirus 2 by RT PCR NEGATIVE NEGATIVE Final    Comment: (NOTE) SARS-CoV-2 target nucleic acids are NOT DETECTED.  The SARS-CoV-2 RNA is generally detectable in upper respiratory specimens during the acute phase of infection. The lowest concentration of SARS-CoV-2 viral copies this assay can detect is 138 copies/mL. A negative result does not preclude SARS-Cov-2 infection and should not be used as the sole basis for treatment or other patient management decisions. A negative result may occur with  improper specimen collection/handling, submission of specimen other than nasopharyngeal swab, presence of viral mutation(s) within the areas targeted by this assay, and inadequate number of viral copies(<138 copies/mL). A negative result must be combined with clinical observations, patient history, and epidemiological information. The expected result is Negative.  Fact Sheet for Patients:  BloggerCourse.com  Fact Sheet for Healthcare Providers:  SeriousBroker.it  This test is no t yet approved or cleared by the [place] FDA and  has been authorized for detection and/or diagnosis of SARS-CoV-2 by FDA under an Emergency Use Authorization (EUA). This EUA will remain  in effect (meaning this test can be used) for the duration of the COVID-19 declaration under Section 564(b)(1) of the Act, 21 U.S.C.section 360bbb-3(b)(1), unless the authorization is terminated  or revoked sooner.       Influenza A by PCR NEGATIVE NEGATIVE Final   Influenza B by PCR NEGATIVE NEGATIVE Final     Comment: (NOTE) The Xpert Xpress SARS-CoV-2/FLU/RSV plus assay is intended as an aid in the diagnosis of influenza from Nasopharyngeal swab specimens and should not be used as a sole basis for treatment. Nasal washings and aspirates are unacceptable for Xpert Xpress SARS-CoV-2/FLU/RSV testing.  Fact Sheet for Patients: BloggerCourse.com  Fact Sheet for Healthcare Providers: SeriousBroker.it  This test is not yet approved or cleared by the [place] FDA and has been authorized for detection and/or diagnosis of SARS-CoV-2 by FDA under an Emergency Use Authorization (EUA). This EUA  will remain in effect (meaning this test can be used) for the duration of the COVID-19 declaration under Section 564(b)(1) of the Act, 21 U.S.C. section 360bbb-3(b)(1), unless the authorization is terminated or revoked.  Performed at [facility], [address], [place], [place] [address]   Culture, blood (single)     Status: None (Preliminary result)   Collection Time: 01/02/21  4:46 AM   Specimen: Left Antecubital; Blood  Result Value Ref Range Status   Specimen Description LEFT ANTECUBITAL  Final   Special Requests   Final    BOTTLES DRAWN AEROBIC AND ANAEROBIC Blood Culture adequate volume   Culture   Final    NO GROWTH 2 DAYS Performed at [facility], [address], [place], [place] [address]    Report Status PENDING  Incomplete  MRSA Next Gen by PCR, Nasal     Status: None   Collection Time: 01/02/21  3:46 PM   Specimen: Nasal Mucosa; Nasal Swab  Result Value Ref Range Status   MRSA by PCR Next Gen NOT DETECTED NOT DETECTED Final    Comment: (NOTE) The GeneXpert MRSA Assay (FDA approved for NASAL specimens only), is one component of a comprehensive MRSA colonization surveillance program. It is not intended to diagnose MRSA infection nor to guide or monitor treatment for MRSA infections. Test performance is not FDA approved in patients  less than 8 years old. Performed at [facility], [address] [place], [place] [address]   C Difficile Quick Screen w PCR reflex     Status: None   Collection Time: 01/03/21 12:55 PM   Specimen: STOOL  Result Value Ref Range Status   C Diff antigen NEGATIVE NEGATIVE Final   C Diff toxin NEGATIVE NEGATIVE Final   C Diff interpretation No C. difficile detected.  Final    Comment: Performed at [facility], [address] [place], [place] [address]     Scheduled Meds:  apixaban  5 mg Oral BID   Chlorhexidine Gluconate Cloth  6 each Topical Daily   metoprolol succinate  25 mg Oral Daily   pantoprazole  40 mg Oral Daily   phosphorus  500 mg Oral BID   potassium chloride  40 mEq Oral Q4H   pravastatin  40 mg Oral QHS   sodium chloride flush  10-40 mL Intracatheter Q12H   Continuous Infusions:  amiodarone 30 mg/hr (01/04/21 0649)   cefTRIAXone (ROCEPHIN)  IV 1 g (01/04/21 0946)   phenylephrine (NEO-SYNEPHRINE) Adult infusion Stopped (01/02/21 1136)    Procedures/Studies: DG CHEST PORT 1 VIEW  Result Date: 01/02/2021 CLINICAL DATA:  PICC line placement EXAM: PORTABLE CHEST 1 VIEW COMPARISON:  08/27/2020 FINDINGS: The heart is enlarged. There is atherosclerosis and tortuosity of the aorta. There is interstitial pulmonary edema and bilateral pleural effusions and dependent atelectasis, left more than right. Basilar pneumonia not excluded. Right arm PICC tip extends to the SVC above the right atrium. Monitor lead overlies the distal catheter in the exact location of the tip can not be established. IMPRESSION: Congestive heart failure pattern. Effusions and basilar atelectasis left worse than right. Right arm PICC tip is at least to the level of the SVC above the right atrium. The exact termination can not be established because of an overlying monitor lead. Electronically Signed   By: Paulina Fusi M.D.   On: 01/02/2021 13:03   DG C-Arm 1-60 Min-No Report  Result Date:  01/02/2021 Fluoroscopy was utilized by the requesting physician.  No radiographic interpretation.  ECHOCARDIOGRAM COMPLETE  Result Date: 01/03/2021    ECHOCARDIOGRAM REPORT   Patient Name:   Dondrea A Horvath Date of Exam: 01/03/2021 Medical Rec #:  [id number]       Height:       63.0 in Accession #:    [id number]      Weight:       137.8 lb Date of Birth:  [birth year]        BSA:          1.650 m Patient Age:    89 years        BP:           148/75 mmHg Patient Gender: F               HR:           79 bpm. [facility] Procedure: 2D Echo, Color Doppler and Cardiac Doppler Indications:    Atrial Fibrillation  History:        Patient has prior history of Echocardiogram examinations, most                 recent 12/22/2018. CHF, CAD, Arrythmias:Atrial Fibrillation,                 Signs/Symptoms:Dyspnea; Risk Factors:Hypertension and                 Dyslipidemia. Ascending aortic aneurysm.  Sonographer:    Mikki Harbor Referring Phys: [id number] SAMUEL G MCDOWELL IMPRESSIONS  1. Left ventricular ejection fraction, by estimation, is 60 to 65%. The left ventricle has normal function. The left ventricle has no regional wall motion abnormalities. Left ventricular diastolic parameters were normal.  2. Right ventricular systolic function is normal. The right ventricular size is mildly enlarged. There is severely elevated pulmonary artery systolic pressure. The estimated right ventricular systolic pressure is 63.4 mmHg.  3. Left atrial size was mildly dilated.  4. Right atrial size was mildly dilated.  5. The mitral valve is degenerative. Mild to moderate mitral valve regurgitation with very eccentric jet.  6. Tricuspid valve regurgitation is moderate to severe.  7. The aortic valve is tricuspid. There is mild calcification of the aortic valve. Aortic valve regurgitation is mild. Mild to moderate aortic valve sclerosis/calcification is present, without any evidence of aortic stenosis. Aortic valve mean gradient  measures 7.0 mmHg.  8. Aortic dilatation noted. There is moderate dilatation of the ascending aorta, measuring 42 mm.  9. The inferior vena cava is dilated in size with <50% respiratory variability, suggesting right atrial pressure of 15 mmHg. Comparison(s): Prior images reviewed side by side. Degree of tricuspid regurgitation and elevated RVSP more prominent in comparison. FINDINGS  Left Ventricle: Left ventricular ejection fraction, by estimation, is 60 to 65%. The left ventricle has normal function. The left ventricle has no regional wall motion abnormalities. The left ventricular internal cavity size was normal in size. There is  borderline left ventricular hypertrophy. Left ventricular diastolic parameters were normal. Right Ventricle: The right ventricular size is mildly enlarged. No increase in right ventricular wall thickness. Right ventricular systolic function is normal. There is severely elevated pulmonary artery systolic pressure. The tricuspid regurgitant velocity is 3.48 m/s, and with an assumed right atrial pressure of 15 mmHg, the estimated right ventricular systolic pressure is 63.4 mmHg. Left Atrium: Left atrial size was mildly dilated. Right Atrium: Right atrial size was mildly dilated. Pericardium: There is no evidence of pericardial effusion. Mitral Valve: The mitral valve is degenerative  in appearance. There is mild calcification of the mitral valve leaflet(s). Mild mitral annular calcification. Mild to moderate mitral valve regurgitation. No evidence of mitral valve stenosis. MV peak gradient, 5.0 mmHg. The mean mitral valve gradient is 2.0 mmHg. Tricuspid Valve: The tricuspid valve is grossly normal. Tricuspid valve regurgitation is moderate to severe. Aortic Valve: The aortic valve is tricuspid. There is mild calcification of the aortic valve. There is mild aortic valve annular calcification. Aortic valve regurgitation is mild. Aortic regurgitation PHT measures 312 msec. Mild to moderate  aortic valve sclerosis/calcification is present, without any evidence of aortic stenosis. Aortic valve mean gradient measures 7.0 mmHg. Aortic valve peak gradient measures 11.6 mmHg. Aortic valve area, by VTI measures 1.78 cm. Pulmonic Valve: The pulmonic valve was grossly normal. Pulmonic valve regurgitation is mild. Aorta: The aortic root is normal in size and structure and aortic dilatation noted. There is moderate dilatation of the ascending aorta, measuring 42 mm. Venous: The inferior vena cava is dilated in size with less than 50% respiratory variability, suggesting right atrial pressure of 15 mmHg. IAS/Shunts: No atrial level shunt detected by color flow Doppler.  LEFT VENTRICLE PLAX 2D LVIDd:         4.48 cm     Diastology LVIDs:         2.64 cm     LV e' medial:    11.90 cm/s LV PW:         0.90 cm     LV E/e' medial:  7.1 LV IVS:        1.07 cm     LV e' lateral:   13.10 cm/s LVOT diam:     1.90 cm     LV E/e' lateral: 6.5 LV SV:         63 LV SV Index:   38 LVOT Area:     2.84 cm  LV Volumes (MOD) LV vol d, MOD A2C: 48.9 ml LV vol d, MOD A4C: 47.2 ml LV vol s, MOD A2C: 19.9 ml LV vol s, MOD A4C: 19.4 ml LV SV MOD A2C:     29.0 ml LV SV MOD A4C:     47.2 ml LV SV MOD BP:      28.3 ml RIGHT VENTRICLE RV Basal diam:  4.14 cm RV Mid diam:    3.78 cm RV S prime:     16.30 cm/s TAPSE (M-mode): 2.0 cm LEFT ATRIUM             Index       RIGHT ATRIUM           Index LA diam:        3.80 cm 2.30 cm/m  RA Area:     18.90 cm LA Vol (A2C):   56.9 ml 34.48 ml/m RA Volume:   56.40 ml  34.17 ml/m LA Vol (A4C):   60.8 ml 36.84 ml/m LA Biplane Vol: 60.3 ml 36.54 ml/m  AORTIC VALVE AV Area (Vmax):    1.77 cm AV Area (Vmean):   1.62 cm AV Area (VTI):     1.78 cm AV Vmax:           170.00 cm/s AV Vmean:          125.000 cm/s AV VTI:            0.352 m AV Peak Grad:      11.6 mmHg AV Mean Grad:      7.0 mmHg LVOT Vmax:  106.00 cm/s LVOT Vmean:        71.300 cm/s LVOT VTI:          0.221 m LVOT/AV VTI ratio:  0.63 AI PHT:            312 msec  AORTA Ao Root diam: 3.20 cm Ao Asc diam:  4.20 cm MITRAL VALVE               TRICUSPID VALVE MV Area (PHT): 3.63 cm    TR Peak grad:   48.4 mmHg MV Area VTI:   2.41 cm    TR Vmax:        348.00 cm/s MV Peak grad:  5.0 mmHg MV Mean grad:  2.0 mmHg    SHUNTS MV Vmax:       1.12 m/s    Systemic VTI:  0.22 m MV Vmean:      63.9 cm/s   Systemic Diam: 1.90 cm MV Decel Time: 209 msec MV E velocity: 85.00 cm/s MV A velocity: 98.70 cm/s MV E/A ratio:  0.86 Nona Dell MD Electronically signed by Nona Dell MD Signature Date/Time: 01/03/2021/11:38:28 AM    Final    CT RENAL STONE STUDY  Result Date: 01/02/2021 CLINICAL DATA:  Flank pain.  Concern for kidney stone. EXAM: CT ABDOMEN AND PELVIS WITHOUT CONTRAST TECHNIQUE: Multidetector CT imaging of the abdomen and pelvis was performed following the standard protocol without IV contrast. COMPARISON:  CT of the abdomen pelvis dated 08/19/2016. FINDINGS: Evaluation of this exam is limited in the absence of intravenous contrast. Lower chest: Minimal bibasilar atelectasis. There is coronary vascular calcification. No intra-abdominal free air or free fluid. Hepatobiliary: The liver is unremarkable. No intrahepatic biliary ductal dilatation. The gallbladder is unremarkable. Pancreas: The pancreas is unremarkable. Spleen: Normal in size without focal abnormality. Adrenals/Urinary Tract: The adrenal glands unremarkable. There is a 7 mm stone in the distal right ureter with mild right hydronephrosis. There is mild right perinephric stranding. Correlation with urinalysis recommended to exclude UTI. There is a 3 cm left renal cyst. No hydronephrosis or nephrolithiasis on the left the left ureter and urinary bladder appear unremarkable. Stomach/Bowel: Moderate size hiatal hernia. There is sigmoid diverticulosis without active inflammatory changes. There is no bowel obstruction or active inflammation. The appendix is normal. Vascular/Lymphatic:  Advanced aortoiliac atherosclerotic disease. The IVC is grossly unremarkable. No portal venous gas. There is no adenopathy. Reproductive: The uterus is anteverted and grossly unremarkable. No adnexal masses. Other: Induration of the subcutaneous soft tissues of the right gluteal region. No fluid collection. Musculoskeletal: Osteopenia with degenerative changes of the spine. Grade 1 L4-L5 anterolisthesis. No acute osseous pathology. IMPRESSION: 1. A 7 mm distal right ureteral stone with mild right hydronephrosis. Correlation with urinalysis recommended to exclude UTI. 2. Sigmoid diverticulosis. No bowel obstruction. Normal appendix. 3. Aortic Atherosclerosis (ICD10-I70.0). Electronically Signed   By: Elgie Collard M.D.   On: 01/02/2021 01:34   [place] EKG SITE RITE  Result Date: 01/02/2021 If Site Rite image not attached, placement could not be confirmed due to current cardiac rhythm.   Vassie Loll, MD  [facility]  If 7PM-7AM, please contact night-coverage [email or web address] Password [email or web address] 01/04/2021, 10:29 AM   LOS: 2 days

## 2021-01-05 LAB — BASIC METABOLIC PANEL
Anion gap: 7 (ref 5–15)
Anion gap: 7 (ref 5–15)
BUN: 15 mg/dL (ref 8–23)
BUN: 12 mg/dL (ref 8–23)
CO2: 32 mmol/L (ref 22–32)
CO2: 31 mmol/L (ref 22–32)
Calcium: 8.1 mg/dL — ABNORMAL LOW (ref 8.9–10.3)
Calcium: 8.2 mg/dL — ABNORMAL LOW (ref 8.9–10.3)
Chloride: 102 mmol/L (ref 98–111)
Chloride: 104 mmol/L (ref 98–111)
Creatinine, Ser: 0.59 mg/dL (ref 0.44–1.00)
Creatinine, Ser: 0.7 mg/dL (ref 0.44–1.00)
GFR, Estimated: >60 mL/min (ref >60)
GFR, Estimated: >60 mL/min (ref >60)
Glucose, Bld: 113 mg/dL — ABNORMAL HIGH (ref 70–99)
Glucose, Bld: 143 mg/dL — ABNORMAL HIGH (ref 70–99)
Potassium: 3.1 mmol/L — ABNORMAL LOW (ref 3.5–5.1)
Potassium: 3.2 mmol/L — ABNORMAL LOW (ref 3.5–5.1)
Sodium: 141 mmol/L (ref 135–145)
Sodium: 142 mmol/L (ref 135–145)

## 2021-01-05 LAB — CBC
HCT: 38.6 % (ref 36.0–46.0)
Hemoglobin: 12.5 g/dL (ref 12.0–15.0)
MCH: 30.9 pg (ref 26.0–34.0)
MCHC: 32.4 g/dL (ref 30.0–36.0)
MCV: 95.3 fL (ref 80.0–100.0)
Platelets: 83 10*3/uL — ABNORMAL LOW (ref 150–400)
RBC: 4.05 MIL/uL (ref 3.87–5.11)
RDW: 13.3 % (ref 11.5–15.5)
WBC: 9 10*3/uL (ref 4.0–10.5)
nRBC: 0 % (ref 0.0–0.2)

## 2021-01-05 MED ORDER — AMIODARONE HCL 200 MG PO TABS
200.0000 mg | ORAL_TABLET | Freq: Two times a day (BID) | ORAL | Status: DC
  Administered 2021-01-05 – 2021-01-07 (×5): 200 mg via ORAL
  Filled 2021-01-05 (×6): qty 1

## 2021-01-05 MED ORDER — POTASSIUM CHLORIDE CRYS ER 20 MEQ PO TBCR
20.0000 meq | EXTENDED_RELEASE_TABLET | Freq: Every day | ORAL | Status: DC
  Administered 2021-01-05 – 2021-01-07 (×3): 20 meq via ORAL
  Filled 2021-01-05 (×3): qty 1

## 2021-01-05 NOTE — Plan of Care (Signed)

## 2021-01-05 NOTE — Progress Notes (Signed)
PROGRESS NOTE  [name] ZOX:[id number] DOB: 1929/02/08 DOA: 01/01/2021 PCP: Benita Stabile, MD   Brief History:  87 year old female with a history of paroxysmal atrial fibrillation, diastolic CHF, hypertension, hyperlipidemia, coronary arterydisease, ascending aortic aneurysm, and nephrolithiasis status post lithotripsy May 2018 presenting with right-sided abdominal pain/right flank pain that began around 1 PM on 01/01/2021.  The patient had been in her usual state of health prior to the beginning of her episodes.  She has some nausea without emesis.  She has some subjective fevers and chills.  She denies any headache, neck pain, chest pain, shortness breath, coughing, hemoptysis, diarrhea, hematochezia, melena. In the emergency department, the patient was febrile up to 101.3 F with tachycardia 120.  She was hemodynamically stable with oxygen saturation 100% on room air.  BMP shows sodium 137, potassium 3.7, serum creatinine of 0.91.  WBC 9.2, hemoglobin 16.4, platelets143K.  Urology was consulted, and there are plans for Dr. Ronne Binning to see the patient for possible ureteral stent placement.   Assessment/Plan: Severe Sepsis secondary to E. Coli -due to UTI -Sepsis present at time of admission with fever, tachycardia, positive source of infection and elevated lactic acid.  -Continue maintaining adequate hydration. -Continue ceftriaxone based on culture data and sensitivity. -lactate peaked 4.6; now back to normal range.   Pyelonephritis/ureteral stone -Urology consulted; appreciate recommendations -01/01/21 CT renal--7 mm distal right ureteral stone with right perinephric stranding and right hydronephrosis -Based on culture results continue Rocephin. -Vancomycin and Zosyn has been discontinued. -Continue to maintain adequate hydration -Per urology service okay to remove Foley catheter. -Mild hematuria appreciated on today's examination; this is expected and okay to continue  anticoagulation as per urology recommendation.  Paroxysmal Atrial Fibrillation with RVR -metoprolol and diltiazem were held initially due to soft BPs and npo status. -started on amiodarone drip>>converted to sinus and currently maintaining her rate well controlled. -Following cardiology recommendations will transition to oral amiodarone and patient will be safe to go to telemetry bed. -Continue apixaban for secondary prevention.  Acute Respiratory Failure with Hypoxia -due to pulmonary edema -stable on 2L and denying sob -Continue attempting to wean off oxygen supplementation.   -Lasix 40 mg IV x 1 -01/03/21 Echo--EF 60-65%, no WMA, severe elevated PASP; mod-severe TR -personally reviewed CXR--increased interstitial markings   Thrombocytopenia -This has been chronic in the past dating back to April 2018 -Monitor for signs of bleeding   Acute on Chronic diastolic CHF -Appears clinically euvolemic currently -12/22/2018 echo EF >65%; mild MR, no WMA -01/03/21 Echo--EF 60-65%, no WMA, severe elevated PASP; mod-severe TR -lasix 40 mg IV x 1 due to vascular congestion as mentioned above. -Continue to follow daily weights and strict I's and O's.   Coronary artery disease -No chest pain -Personally reviewed EKG--- sinus rhythm, no concerning ST-T wave change   Ascending aortic aneurysm -Measuring 5.2 cm on last imaging -Patient follows Dr. Emi Holes to be a poor surgical candidate -Continue outpatient follow-up.   Hyperlipidemia -Continue the use of statin  Hypomagnesemia/Hypophosphatemia/Hypokalemia -Continue to follow electrolytes and further replete as needed. -Goal is for potassium above 4 and magnesium above 2. -Potassium currently 3.2    Status is: Inpatient   Remains inpatient appropriate because:Inpatient level of care appropriate due to severity of illness   Dispo: The patient is from: Home              Anticipated d/c is to: Home  Patient  currently is not medically stable to d/c.  Will transfer to telemetry bed.              Difficult to place patient No         Family Communication:   son updated on 9/14   Consultants:  urology   Code Status:  FULL    DVT Prophylaxis:  apixaban     Procedures: As Listed in Progress Note Above   Antibiotics: Ceftriaxone 9/11>>      Subjective: No fever, no chest pain, no nausea, no vomiting.  Still requiring 2 L nasal cannula supplementation but expressing no orthopnea and improvement in her breathing.  Foley catheter is still in place with orders by urology to attempt voiding trial.  Transitioning to oral amiodarone with plans to go to telemetry bed.  Objective: Vitals:   01/05/21 0800 01/05/21 0830 01/05/21 1024 01/05/21 1116  BP: (!) 162/87 (!) 163/85 (!) 177/89   Pulse: 72 73 75 78  Resp: (!) Temp:    99.3 F (37.4 C)  TempSrc:    Oral  SpO2: 95% 93%    Weight:      Height:        Intake/Output Summary (Last 24 hours) at 01/05/2021 1328 Last data filed at 01/05/2021 1024 Gross per 24 hour  Intake 923.16 ml  Output 575 ml  Net 348.16 ml   Weight change:   Exam: General exam: Alert, awake, oriented x 3; no chest pain, no nausea, no vomiting.  Good urine output has been reported.  Patient's breathing continues to improve.  No dysuria currently. Respiratory system: Decreased breath sounds at the bases, no wheezing, no frank crackles.  No using accessory muscles. Cardiovascular system: Rate controlled, no rubs, no gallops, no JVD.   Gastrointestinal system/GU: Abdomen is nondistended, soft and nontender. No organomegaly or masses felt. Normal bowel sounds heard.  Foley catheter still in place. Central nervous system: Alert and oriented. No focal neurological deficits. Extremities: No cyanosis or clubbing. Skin: No petechiae. Psychiatry: Judgement and insight appear normal. Mood & affect appropriate.   Data Reviewed: I have personally reviewed  following labs and imaging studies  Basic Metabolic Panel: Recent Labs  Lab 01/02/21 0057 01/02/21 0848 01/03/21 0531 01/04/21 0442 01/04/21 1401 01/05/21 0442  NA 137  --  138 141  --  141  K 3.7  --  3.2* 2.7* 2.9* 3.1*  CL 95*  --  101 99  --  102  CO2 27  --  27 33*  --  32  GLUCOSE 158*  --  105* 115*  --  113*  BUN 17  --  18 16  --  15  CREATININE 0.91  --  0.83 0.71  --  0.59  CALCIUM 9.8  --  7.8* 8.2*  --  8.1*  MG  --  1.4*  --  1.8  --   --   PHOS  --  2.2*  --  2.4*  --   --    Liver Function Tests: Recent Labs  Lab 01/03/21 0531  AST 21  ALT 20  ALKPHOS 49  BILITOT 0.9  PROT 5.5*  ALBUMIN 2.9*    Coagulation Profile: Recent Labs  Lab 01/02/21 0848  INR 1.4*   CBC: Recent Labs  Lab 01/02/21 0057 01/03/21 0531 01/04/21 0442 01/05/21 0442  WBC 9.2 19.6* 15.8* 9.0  NEUTROABS 8.5*  --   --   --   HGB  16.4* 12.8 13.5 12.5  HCT 49.4* 40.4 41.5 38.6  MCV 93.4 96.4 95.2 95.3  PLT 143* 76* 75* 83*    Urine analysis:    Component Value Date/Time   COLORURINE YELLOW 01/01/2021 2300   APPEARANCEUR HAZY (A) 01/01/2021 2300   APPEARANCEUR Hazy (A) 11/15/2020 0944   LABSPEC 1.025 01/01/2021 2300   PHURINE 6.0 01/01/2021 2300   GLUCOSEU NEGATIVE 01/01/2021 2300   HGBUR LARGE (A) 01/01/2021 2300   BILIRUBINUR NEGATIVE 01/01/2021 2300   BILIRUBINUR Negative 11/15/2020 0944   KETONESUR >80 (A) 01/01/2021 2300   PROTEINUR 30 (A) 01/01/2021 2300   UROBILINOGEN negative (A) 05/19/2019 0844   NITRITE POSITIVE (A) 01/01/2021 2300   LEUKOCYTESUR SMALL (A) 01/01/2021 2300   Sepsis Labs:  Recent Results (from the past 240 hour(s))  Urine Culture     Status: Abnormal   Collection Time: 01/01/21 11:00 PM   Specimen: Urine, Clean Catch  Result Value Ref Range Status   Specimen Description   Final    URINE, CLEAN CATCH Performed at [facility], [address] [place], [place] [address]    Special Requests   Final    NONE Performed at [facility], [address], [place], [place] [address]    Culture >=100,000 COLONIES/mL ESCHERICHIA COLI (A)  Final   Report Status 01/04/2021 FINAL  Final   Organism ID, Bacteria ESCHERICHIA COLI (A)  Final      Susceptibility   Escherichia coli - MIC*    AMPICILLIN <=2 SENSITIVE Sensitive     CEFAZOLIN <=4 SENSITIVE Sensitive     CEFEPIME <=0.12 SENSITIVE Sensitive     CEFTRIAXONE <=0.25 SENSITIVE Sensitive     CIPROFLOXACIN <=0.25 SENSITIVE Sensitive     GENTAMICIN <=1 SENSITIVE Sensitive     IMIPENEM <=0.25 SENSITIVE Sensitive     NITROFURANTOIN <=16 SENSITIVE Sensitive     TRIMETH/SULFA <=20 SENSITIVE Sensitive     AMPICILLIN/SULBACTAM <=2 SENSITIVE Sensitive     PIP/TAZO <=4 SENSITIVE Sensitive     * >=100,000 COLONIES/mL ESCHERICHIA COLI  Resp Panel by RT-PCR (Flu A&B, Covid) Nasopharyngeal Swab     Status: None   Collection Time: 01/02/21  4:17 AM   Specimen: Nasopharyngeal Swab; Nasopharyngeal(NP) swabs in vial transport medium  Result Value Ref Range Status   SARS Coronavirus 2 by RT PCR NEGATIVE NEGATIVE Final    Comment: (NOTE) SARS-CoV-2 target nucleic acids are NOT DETECTED.  The SARS-CoV-2 RNA is generally detectable in upper respiratory specimens during the acute phase of infection. The lowest concentration of SARS-CoV-2 viral copies this assay can detect is 138 copies/mL. A negative result does not preclude SARS-Cov-2 infection and should not be used as the sole basis for treatment or other patient management decisions. A negative result may occur with  improper specimen collection/handling, submission of specimen other than nasopharyngeal swab, presence of viral mutation(s) within the areas targeted by this assay, and inadequate number of viral copies(<138 copies/mL). A negative result must be combined with clinical observations, patient history, and epidemiological information. The expected result is Negative.  Fact Sheet for Patients:   BloggerCourse.com  Fact Sheet for Healthcare Providers:  SeriousBroker.it  This test is no t yet approved or cleared by the [place] FDA and  has been authorized for detection and/or diagnosis of SARS-CoV-2 by FDA under an Emergency Use Authorization (EUA). This EUA will remain  in effect (meaning this test can be used) for the duration of the COVID-19 declaration under Section 564(b)(1) of the Act, 21  U.S.C.section 360bbb-3(b)(1), unless the authorization is terminated  or revoked sooner.       Influenza A by PCR NEGATIVE NEGATIVE Final   Influenza B by PCR NEGATIVE NEGATIVE Final    Comment: (NOTE) The Xpert Xpress SARS-CoV-2/FLU/RSV plus assay is intended as an aid in the diagnosis of influenza from Nasopharyngeal swab specimens and should not be used as a sole basis for treatment. Nasal washings and aspirates are unacceptable for Xpert Xpress SARS-CoV-2/FLU/RSV testing.  Fact Sheet for Patients: BloggerCourse.com  Fact Sheet for Healthcare Providers: SeriousBroker.it  This test is not yet approved or cleared by the [place] FDA and has been authorized for detection and/or diagnosis of SARS-CoV-2 by FDA under an Emergency Use Authorization (EUA). This EUA will remain in effect (meaning this test can be used) for the duration of the COVID-19 declaration under Section 564(b)(1) of the Act, 21 U.S.C. section 360bbb-3(b)(1), unless the authorization is terminated or revoked.  Performed at [facility], [address] [place], [place] [address]   Culture, blood (single)     Status: None (Preliminary result)   Collection Time: 01/02/21  4:46 AM   Specimen: Left Antecubital; Blood  Result Value Ref Range Status   Specimen Description LEFT ANTECUBITAL  Final   Special Requests   Final    BOTTLES DRAWN AEROBIC AND ANAEROBIC Blood Culture adequate volume   Culture    Final    NO GROWTH 3 DAYS Performed at [facility], [address] [place], [place] [address]    Report Status PENDING  Incomplete  MRSA Next Gen by PCR, Nasal     Status: None   Collection Time: 01/02/21  3:46 PM   Specimen: Nasal Mucosa; Nasal Swab  Result Value Ref Range Status   MRSA by PCR Next Gen NOT DETECTED NOT DETECTED Final    Comment: (NOTE) The GeneXpert MRSA Assay (FDA approved for NASAL specimens only), is one component of a comprehensive MRSA colonization surveillance program. It is not intended to diagnose MRSA infection nor to guide or monitor treatment for MRSA infections. Test performance is not FDA approved in patients less than 73 years old. Performed at [facility], [address] [place], [place] [address]   C Difficile Quick Screen w PCR reflex     Status: None   Collection Time: 01/03/21 12:55 PM   Specimen: STOOL  Result Value Ref Range Status   C Diff antigen NEGATIVE NEGATIVE Final   C Diff toxin NEGATIVE NEGATIVE Final   C Diff interpretation No C. difficile detected.  Final    Comment: Performed at [facility], [address] [place], [place] [address]     Scheduled Meds:  amiodarone  200 mg Oral BID   apixaban  5 mg Oral BID   Chlorhexidine Gluconate Cloth  6 each Topical Daily   influenza vaccine adjuvanted  0.5 mL Intramuscular Tomorrow-1000   metoprolol succinate  25 mg Oral Daily   pantoprazole  40 mg Oral Daily   phosphorus  500 mg Oral BID   potassium chloride  20 mEq Oral Daily   pravastatin  40 mg Oral QHS   sodium chloride flush  10-40 mL Intracatheter Q12H   Continuous Infusions:  cefTRIAXone (ROCEPHIN)  IV 1 g (01/05/21 1030)   phenylephrine (NEO-SYNEPHRINE) Adult infusion Stopped (01/02/21 1136)    Procedures/Studies: DG CHEST PORT 1 VIEW  Result Date: 01/02/2021 CLINICAL DATA:  PICC line placement EXAM: PORTABLE CHEST 1 VIEW COMPARISON:  08/27/2020 FINDINGS: The heart is enlarged. There  is atherosclerosis and tortuosity  of the aorta. There is interstitial pulmonary edema and bilateral pleural effusions and dependent atelectasis, left more than right. Basilar pneumonia not excluded. Right arm PICC tip extends to the SVC above the right atrium. Monitor lead overlies the distal catheter in the exact location of the tip can not be established. IMPRESSION: Congestive heart failure pattern. Effusions and basilar atelectasis left worse than right. Right arm PICC tip is at least to the level of the SVC above the right atrium. The exact termination can not be established because of an overlying monitor lead. Electronically Signed   By: Paulina Fusi M.D.   On: 01/02/2021 13:03   DG C-Arm 1-60 Min-No Report  Result Date: 01/02/2021 Fluoroscopy was utilized by the requesting physician.  No radiographic interpretation.   ECHOCARDIOGRAM COMPLETE  Result Date: 01/03/2021    ECHOCARDIOGRAM REPORT   Patient Name:   Wilhelmena A Spain Date of Exam: 01/03/2021 Medical Rec #:  [id number]       Height:       63.0 in Accession #:    [id number]      Weight:       137.8 lb Date of Birth:  [birth year]        BSA:          1.650 m Patient Age:    89 years        BP:           148/75 mmHg Patient Gender: F               HR:           79 bpm. [facility] Procedure: 2D Echo, Color Doppler and Cardiac Doppler Indications:    Atrial Fibrillation  History:        Patient has prior history of Echocardiogram examinations, most                 recent 12/22/2018. CHF, CAD, Arrythmias:Atrial Fibrillation,                 Signs/Symptoms:Dyspnea; Risk Factors:Hypertension and                 Dyslipidemia. Ascending aortic aneurysm.  Sonographer:    Mikki Harbor Referring Phys: [id number] SAMUEL G MCDOWELL IMPRESSIONS  1. Left ventricular ejection fraction, by estimation, is 60 to 65%. The left ventricle has normal function. The left ventricle has no regional wall motion abnormalities. Left ventricular diastolic parameters were normal.  2. Right ventricular  systolic function is normal. The right ventricular size is mildly enlarged. There is severely elevated pulmonary artery systolic pressure. The estimated right ventricular systolic pressure is 63.4 mmHg.  3. Left atrial size was mildly dilated.  4. Right atrial size was mildly dilated.  5. The mitral valve is degenerative. Mild to moderate mitral valve regurgitation with very eccentric jet.  6. Tricuspid valve regurgitation is moderate to severe.  7. The aortic valve is tricuspid. There is mild calcification of the aortic valve. Aortic valve regurgitation is mild. Mild to moderate aortic valve sclerosis/calcification is present, without any evidence of aortic stenosis. Aortic valve mean gradient measures 7.0 mmHg.  8. Aortic dilatation noted. There is moderate dilatation of the ascending aorta, measuring 42 mm.  9. The inferior vena cava is dilated in size with <50% respiratory variability, suggesting right atrial pressure of 15 mmHg. Comparison(s): Prior images reviewed side by side. Degree of tricuspid regurgitation and elevated RVSP more prominent in comparison. FINDINGS  Left Ventricle: Left ventricular ejection fraction, by estimation, is 60 to 65%. The left ventricle has normal function. The left ventricle has no regional wall motion abnormalities. The left ventricular internal cavity size was normal in size. There is  borderline left ventricular hypertrophy. Left ventricular diastolic parameters were normal. Right Ventricle: The right ventricular size is mildly enlarged. No increase in right ventricular wall thickness. Right ventricular systolic function is normal. There is severely elevated pulmonary artery systolic pressure. The tricuspid regurgitant velocity is 3.48 m/s, and with an assumed right atrial pressure of 15 mmHg, the estimated right ventricular systolic pressure is 63.4 mmHg. Left Atrium: Left atrial size was mildly dilated. Right Atrium: Right atrial size was mildly dilated. Pericardium: There  is no evidence of pericardial effusion. Mitral Valve: The mitral valve is degenerative in appearance. There is mild calcification of the mitral valve leaflet(s). Mild mitral annular calcification. Mild to moderate mitral valve regurgitation. No evidence of mitral valve stenosis. MV peak gradient, 5.0 mmHg. The mean mitral valve gradient is 2.0 mmHg. Tricuspid Valve: The tricuspid valve is grossly normal. Tricuspid valve regurgitation is moderate to severe. Aortic Valve: The aortic valve is tricuspid. There is mild calcification of the aortic valve. There is mild aortic valve annular calcification. Aortic valve regurgitation is mild. Aortic regurgitation PHT measures 312 msec. Mild to moderate aortic valve sclerosis/calcification is present, without any evidence of aortic stenosis. Aortic valve mean gradient measures 7.0 mmHg. Aortic valve peak gradient measures 11.6 mmHg. Aortic valve area, by VTI measures 1.78 cm. Pulmonic Valve: The pulmonic valve was grossly normal. Pulmonic valve regurgitation is mild. Aorta: The aortic root is normal in size and structure and aortic dilatation noted. There is moderate dilatation of the ascending aorta, measuring 42 mm. Venous: The inferior vena cava is dilated in size with less than 50% respiratory variability, suggesting right atrial pressure of 15 mmHg. IAS/Shunts: No atrial level shunt detected by color flow Doppler.  LEFT VENTRICLE PLAX 2D LVIDd:         4.48 cm     Diastology LVIDs:         2.64 cm     LV e' medial:    11.90 cm/s LV PW:         0.90 cm     LV E/e' medial:  7.1 LV IVS:        1.07 cm     LV e' lateral:   13.10 cm/s LVOT diam:     1.90 cm     LV E/e' lateral: 6.5 LV SV:         63 LV SV Index:   38 LVOT Area:     2.84 cm  LV Volumes (MOD) LV vol d, MOD A2C: 48.9 ml LV vol d, MOD A4C: 47.2 ml LV vol s, MOD A2C: 19.9 ml LV vol s, MOD A4C: 19.4 ml LV SV MOD A2C:     29.0 ml LV SV MOD A4C:     47.2 ml LV SV MOD BP:      28.3 ml RIGHT VENTRICLE RV Basal diam:   4.14 cm RV Mid diam:    3.78 cm RV S prime:     16.30 cm/s TAPSE (M-mode): 2.0 cm LEFT ATRIUM             Index       RIGHT ATRIUM           Index LA diam:        3.80 cm 2.30 cm/m  RA Area:     18.90 cm LA Vol (A2C):   56.9 ml 34.48 ml/m RA Volume:   56.40 ml  34.17 ml/m LA Vol (A4C):   60.8 ml 36.84 ml/m LA Biplane Vol: 60.3 ml 36.54 ml/m  AORTIC VALVE AV Area (Vmax):    1.77 cm AV Area (Vmean):   1.62 cm AV Area (VTI):     1.78 cm AV Vmax:           170.00 cm/s AV Vmean:          125.000 cm/s AV VTI:            0.352 m AV Peak Grad:      11.6 mmHg AV Mean Grad:      7.0 mmHg LVOT Vmax:         106.00 cm/s LVOT Vmean:        71.300 cm/s LVOT VTI:          0.221 m LVOT/AV VTI ratio: 0.63 AI PHT:            312 msec  AORTA Ao Root diam: 3.20 cm Ao Asc diam:  4.20 cm MITRAL VALVE               TRICUSPID VALVE MV Area (PHT): 3.63 cm    TR Peak grad:   48.4 mmHg MV Area VTI:   2.41 cm    TR Vmax:        348.00 cm/s MV Peak grad:  5.0 mmHg MV Mean grad:  2.0 mmHg    SHUNTS MV Vmax:       1.12 m/s    Systemic VTI:  0.22 m MV Vmean:      63.9 cm/s   Systemic Diam: 1.90 cm MV Decel Time: 209 msec MV E velocity: 85.00 cm/s MV A velocity: 98.70 cm/s MV E/A ratio:  0.86 Nona Dell MD Electronically signed by Nona Dell MD Signature Date/Time: 01/03/2021/11:38:28 AM    Final    CT RENAL STONE STUDY  Result Date: 01/02/2021 CLINICAL DATA:  Flank pain.  Concern for kidney stone. EXAM: CT ABDOMEN AND PELVIS WITHOUT CONTRAST TECHNIQUE: Multidetector CT imaging of the abdomen and pelvis was performed following the standard protocol without IV contrast. COMPARISON:  CT of the abdomen pelvis dated 08/19/2016. FINDINGS: Evaluation of this exam is limited in the absence of intravenous contrast. Lower chest: Minimal bibasilar atelectasis. There is coronary vascular calcification. No intra-abdominal free air or free fluid. Hepatobiliary: The liver is unremarkable. No intrahepatic biliary ductal dilatation. The  gallbladder is unremarkable. Pancreas: The pancreas is unremarkable. Spleen: Normal in size without focal abnormality. Adrenals/Urinary Tract: The adrenal glands unremarkable. There is a 7 mm stone in the distal right ureter with mild right hydronephrosis. There is mild right perinephric stranding. Correlation with urinalysis recommended to exclude UTI. There is a 3 cm left renal cyst. No hydronephrosis or nephrolithiasis on the left the left ureter and urinary bladder appear unremarkable. Stomach/Bowel: Moderate size hiatal hernia. There is sigmoid diverticulosis without active inflammatory changes. There is no bowel obstruction or active inflammation. The appendix is normal. Vascular/Lymphatic: Advanced aortoiliac atherosclerotic disease. The IVC is grossly unremarkable. No portal venous gas. There is no adenopathy. Reproductive: The uterus is anteverted and grossly unremarkable. No adnexal masses. Other: Induration of the subcutaneous soft tissues of the right gluteal region. No fluid collection. Musculoskeletal: Osteopenia with degenerative changes of the spine. Grade 1 L4-L5 anterolisthesis. No acute osseous pathology. IMPRESSION: 1. A 7 mm distal right  ureteral stone with mild right hydronephrosis. Correlation with urinalysis recommended to exclude UTI. 2. Sigmoid diverticulosis. No bowel obstruction. Normal appendix. 3. Aortic Atherosclerosis (ICD10-I70.0). Electronically Signed   By: Elgie Collard M.D.   On: 01/02/2021 01:34   [place] EKG SITE RITE  Result Date: 01/02/2021 If Site Rite image not attached, placement could not be confirmed due to current cardiac rhythm.   Vassie Loll, MD  [facility]  If 7PM-7AM, please contact night-coverage [email or web address] Password [email or web address] 01/05/2021, 1:28 PM   LOS: 3 days

## 2021-01-05 NOTE — Progress Notes (Signed)
Physical Therapy Treatment Patient Details Name: [name] MRN: [id number] DOB: 05-31-1928 Today's Date: 01/05/2021   History of Present Illness [name] is a 87 y.o. female presents with R side badominal and flank pain. CT shows 7 mm distal ureteral stone; s/p R uretal stent placement 9/12. PMH: AAA, CHF, HTN, afib, thrombocytopenia, trigeminal neuralgia    PT Comments    Pt friendly and willing to participate with therapy today.  Min A with bed mobility to assist with upright trunk and scooting to EOB.  Cueing for handplacement to assist with standing from bed with min guard.  Able to ambulate with slow cadence, no LOB episodes.  Completed 2 sets inside room.  Pt able to keep O2 saturation from 94-98% with 1L O2 A.  2nd set complete without nasal canal and noted O2 sat decreased 91-88%.  Bowel movement complete during this session.  Pt able to stand and wash her hands with no LOB.  EOS pt left in chair with call bell within reach and eating dinner.  No reports of pain through session and minimal fatigue with activities today.    Recommendations for follow up therapy are one component of a multi-disciplinary discharge planning process, led by the attending physician.  Recommendations may be updated based on patient status, additional functional criteria and insurance authorization.  Follow Up Recommendations  Outpatient PT;Supervision - Intermittent     Equipment Recommendations  None recommended by PT    Recommendations for Other Services       Precautions / Restrictions Precautions Precautions: Fall Precaution Comments: monitor O2, foley catheter Restrictions Weight Bearing Restrictions: No     Mobility  Bed Mobility Overal bed mobility: Modified Independent Bed Mobility: Supine to Sit     Supine to sit: Min assist     General bed mobility comments: slow to mobilize to EOB due to mattress, min A to ultimately upright trunk and scoot out to EOB     Transfers Overall transfer level: Needs assistance Equipment used: Rolling walker (2 wheeled) Transfers: Sit to/from Stand Sit to Stand: Min guard         General transfer comment: Cueing for handplacement to assist with standing  Ambulation/Gait Ambulation/Gait assistance: Min guard Gait Distance (Feet): 15 Feet (x2) Assistive device: Rolling walker (2 wheeled) Gait Pattern/deviations: Step-through pattern;Decreased stride length;Trunk flexed Gait velocity: decreased   General Gait Details: RW slow cadence with no LOB, on 1L O2 first set with Sp O2 94-98%, removed nasal canal 2nd set with O2 range from 88-91% while ambulating.  Nasal canal placed back onto nose at EOS.  RN aware of status.   Stairs             Wheelchair Mobility    Modified Rankin (Stroke Patients Only)       Balance                                            Cognition Arousal/Alertness: Awake/alert Behavior During Therapy: WFL for tasks assessed/performed Overall Cognitive Status: Within Functional Limits for tasks assessed                                        Exercises      General Comments        Pertinent Vitals/Pain  Pain Assessment: No/denies pain    Home Living                      Prior Function            PT Goals (current goals can now be found in the care plan section)      Frequency    Min 3X/week      PT Plan      Co-evaluation              AM-PAC PT "6 Clicks" Mobility   Outcome Measure  Help needed turning from your back to your side while in a flat bed without using bedrails?: A Little Help needed moving from lying on your back to sitting on the side of a flat bed without using bedrails?: A Little Help needed moving to and from a bed to a chair (including a wheelchair)?: A Little Help needed standing up from a chair using your arms (e.g., wheelchair or bedside chair)?: A Little Help needed to  walk in hospital room?: A Little Help needed climbing 3-5 steps with a railing? : A Little 6 Click Score: 18    End of Session Equipment Utilized During Treatment: Gait belt;Oxygen Activity Tolerance: Patient tolerated treatment well Patient left: in chair;with call bell/phone within reach Nurse Communication: Mobility status;Other (comment) PT Visit Diagnosis: Other abnormalities of gait and mobility (R26.89);Muscle weakness (generalized) (M62.81)     Time: 1700-1740 PT Time Calculation (min) (ACUTE ONLY): 40 min  Charges:  $Therapeutic Exercise: 38-52 mins                     Becky Sax, LPTA/CLT; CBIS [phone]  Juel Burrow 01/05/2021, 5:45 PM

## 2021-01-05 NOTE — Progress Notes (Signed)
Progress Note  Patient Name: [name] Date of Encounter: 01/05/2021  Primary Cardiologist: Nona Dell, MD  Subjective   No palpitations or chest discomfort.  Eating breakfast this morning.  Inpatient Medications    Scheduled Meds:  apixaban  5 mg Oral BID   Chlorhexidine Gluconate Cloth  6 each Topical Daily   influenza vaccine adjuvanted  0.5 mL Intramuscular Tomorrow-1000   metoprolol succinate  25 mg Oral Daily   pantoprazole  40 mg Oral Daily   phosphorus  500 mg Oral BID   pravastatin  40 mg Oral QHS   sodium chloride flush  10-40 mL Intracatheter Q12H   Continuous Infusions:  amiodarone 30 mg/hr (01/05/21 0644)   cefTRIAXone (ROCEPHIN)  IV Stopped (01/04/21 1017)   phenylephrine (NEO-SYNEPHRINE) Adult infusion Stopped (01/02/21 1136)   PRN Meds: acetaminophen, HYDROmorphone (DILAUDID) injection, sodium chloride flush   Vital Signs    Vitals:   01/05/21 0630 01/05/21 0713 01/05/21 0800 01/05/21 0830  BP: (!) 178/91  (!) 162/87 (!) 163/85  Pulse: 74 72 72 73  Resp: (!) 28 (!) 25 (!) 22 20  Temp:  98 F (36.7 C)    TempSrc:  Oral    SpO2: 96% 96% 95% 93%  Weight:      Height:        Intake/Output Summary (Last 24 hours) at 01/05/2021 0922 Last data filed at 01/05/2021 0651 Gross per 24 hour  Intake 923.16 ml  Output 575 ml  Net 348.16 ml   Filed Weights   01/01/21 2245 01/04/21 0531  Weight: 62.5 kg 66 kg    Telemetry    Sinus rhythm.  Personally reviewed.  ECG    An ECG dated 01/03/2021 was personally reviewed today and demonstrated:  Sinus rhythm with PAC, low voltage.  Physical Exam   GEN: Elderly woman, no acute distress.   Neck: No JVD. Cardiac: RRR without gallop.  Respiratory: Nonlabored. Clear to auscultation bilaterally. GI: Soft, nontender, bowel sounds present. MS: No edema.  Labs    Chemistry Recent Labs  Lab 01/03/21 0531 01/04/21 0442 01/04/21 1401 01/05/21 0442  NA 138 141  --  141  K 3.2* 2.7* 2.9*  3.1*  CL 101 99  --  102  CO2 27 33*  --  32  GLUCOSE 105* 115*  --  113*  BUN 18 16  --  15  CREATININE 0.83 0.71  --  0.59  CALCIUM 7.8* 8.2*  --  8.1*  PROT 5.5*  --   --   --   ALBUMIN 2.9*  --   --   --   AST 21  --   --   --   ALT 20  --   --   --   ALKPHOS 49  --   --   --   BILITOT 0.9  --   --   --   GFRNONAA >60 >60  --  >60  ANIONGAP 10 9  --  7     Hematology Recent Labs  Lab 01/03/21 0531 01/04/21 0442 01/05/21 0442  WBC 19.6* 15.8* 9.0  RBC 4.19 4.36 4.05  HGB 12.8 13.5 12.5  HCT 40.4 41.5 38.6  MCV 96.4 95.2 95.3  MCH 30.5 31.0 30.9  MCHC 31.7 32.5 32.4  RDW 13.4 13.4 13.3  PLT 76* 75* 83*    Radiology    ECHOCARDIOGRAM COMPLETE  Result Date: 01/03/2021    ECHOCARDIOGRAM REPORT   Patient Name:   [name]  Date of Exam: 01/03/2021 Medical Rec #:  [id number]       Height:       63.0 in Accession #:    [id number]      Weight:       137.8 lb Date of Birth:  1928/09/08        BSA:          1.650 m Patient Age:    87 years        BP:           148/75 mmHg Patient Gender: F               HR:           79 bpm. Exam [facility] Procedure: 2D Echo, Color Doppler and Cardiac Doppler Indications:    Atrial Fibrillation  History:        Patient has prior history of Echocardiogram examinations, most                 recent 12/22/2018. CHF, CAD, Arrythmias:Atrial Fibrillation,                 Signs/Symptoms:Dyspnea; Risk Factors:Hypertension and                 Dyslipidemia. Ascending aortic aneurysm.  Sonographer:    Mikki Harbor Referring Phys: 80 Kollyn Lingafelter G Conley Pawling IMPRESSIONS  1. Left ventricular ejection fraction, by estimation, is 60 to 65%. The left ventricle has normal function. The left ventricle has no regional wall motion abnormalities. Left ventricular diastolic parameters were normal.  2. Right ventricular systolic function is normal. The right ventricular size is mildly enlarged. There is severely elevated pulmonary artery systolic pressure. The  estimated right ventricular systolic pressure is 63.4 mmHg.  3. Left atrial size was mildly dilated.  4. Right atrial size was mildly dilated.  5. The mitral valve is degenerative. Mild to moderate mitral valve regurgitation with very eccentric jet.  6. Tricuspid valve regurgitation is moderate to severe.  7. The aortic valve is tricuspid. There is mild calcification of the aortic valve. Aortic valve regurgitation is mild. Mild to moderate aortic valve sclerosis/calcification is present, without any evidence of aortic stenosis. Aortic valve mean gradient measures 7.0 mmHg.  8. Aortic dilatation noted. There is moderate dilatation of the ascending aorta, measuring 42 mm.  9. The inferior vena cava is dilated in size with <50% respiratory variability, suggesting right atrial pressure of 15 mmHg. Comparison(s): Prior images reviewed side by side. Degree of tricuspid regurgitation and elevated RVSP more prominent in comparison. FINDINGS  Left Ventricle: Left ventricular ejection fraction, by estimation, is 60 to 65%. The left ventricle has normal function. The left ventricle has no regional wall motion abnormalities. The left ventricular internal cavity size was normal in size. There is  borderline left ventricular hypertrophy. Left ventricular diastolic parameters were normal. Right Ventricle: The right ventricular size is mildly enlarged. No increase in right ventricular wall thickness. Right ventricular systolic function is normal. There is severely elevated pulmonary artery systolic pressure. The tricuspid regurgitant velocity is 3.48 m/s, and with an assumed right atrial pressure of 15 mmHg, the estimated right ventricular systolic pressure is 63.4 mmHg. Left Atrium: Left atrial size was mildly dilated. Right Atrium: Right atrial size was mildly dilated. Pericardium: There is no evidence of pericardial effusion. Mitral Valve: The mitral valve is degenerative in appearance. There is mild calcification of the mitral  valve leaflet(s). Mild mitral annular calcification. Mild to moderate mitral  valve regurgitation. No evidence of mitral valve stenosis. MV peak gradient, 5.0 mmHg. The mean mitral valve gradient is 2.0 mmHg. Tricuspid Valve: The tricuspid valve is grossly normal. Tricuspid valve regurgitation is moderate to severe. Aortic Valve: The aortic valve is tricuspid. There is mild calcification of the aortic valve. There is mild aortic valve annular calcification. Aortic valve regurgitation is mild. Aortic regurgitation PHT measures 312 msec. Mild to moderate aortic valve sclerosis/calcification is present, without any evidence of aortic stenosis. Aortic valve mean gradient measures 7.0 mmHg. Aortic valve peak gradient measures 11.6 mmHg. Aortic valve area, by VTI measures 1.78 cm. Pulmonic Valve: The pulmonic valve was grossly normal. Pulmonic valve regurgitation is mild. Aorta: The aortic root is normal in size and structure and aortic dilatation noted. There is moderate dilatation of the ascending aorta, measuring 42 mm. Venous: The inferior vena cava is dilated in size with less than 50% respiratory variability, suggesting right atrial pressure of 15 mmHg. IAS/Shunts: No atrial level shunt detected by color flow Doppler.  LEFT VENTRICLE PLAX 2D LVIDd:         4.48 cm     Diastology LVIDs:         2.64 cm     LV e' medial:    11.90 cm/s LV PW:         0.90 cm     LV E/e' medial:  7.1 LV IVS:        1.07 cm     LV e' lateral:   13.10 cm/s LVOT diam:     1.90 cm     LV E/e' lateral: 6.5 LV SV:         63 LV SV Index:   38 LVOT Area:     2.84 cm  LV Volumes (MOD) LV vol d, MOD A2C: 48.9 ml LV vol d, MOD A4C: 47.2 ml LV vol s, MOD A2C: 19.9 ml LV vol s, MOD A4C: 19.4 ml LV SV MOD A2C:     29.0 ml LV SV MOD A4C:     47.2 ml LV SV MOD BP:      28.3 ml RIGHT VENTRICLE RV Basal diam:  4.14 cm RV Mid diam:    3.78 cm RV S prime:     16.30 cm/s TAPSE (M-mode): 2.0 cm LEFT ATRIUM             Index       RIGHT ATRIUM            Index LA diam:        3.80 cm 2.30 cm/m  RA Area:     18.90 cm LA Vol (A2C):   56.9 ml 34.48 ml/m RA Volume:   56.40 ml  34.17 ml/m LA Vol (A4C):   60.8 ml 36.84 ml/m LA Biplane Vol: 60.3 ml 36.54 ml/m  AORTIC VALVE AV Area (Vmax):    1.77 cm AV Area (Vmean):   1.62 cm AV Area (VTI):     1.78 cm AV Vmax:           170.00 cm/s AV Vmean:          125.000 cm/s AV VTI:            0.352 m AV Peak Grad:      11.6 mmHg AV Mean Grad:      7.0 mmHg LVOT Vmax:         106.00 cm/s LVOT Vmean:        71.300 cm/s LVOT VTI:  0.221 m LVOT/AV VTI ratio: 0.63 AI PHT:            312 msec  AORTA Ao Root diam: 3.20 cm Ao Asc diam:  4.20 cm MITRAL VALVE               TRICUSPID VALVE MV Area (PHT): 3.63 cm    TR Peak grad:   48.4 mmHg MV Area VTI:   2.41 cm    TR Vmax:        348.00 cm/s MV Peak grad:  5.0 mmHg MV Mean grad:  2.0 mmHg    SHUNTS MV Vmax:       1.12 m/s    Systemic VTI:  0.22 m MV Vmean:      63.9 cm/s   Systemic Diam: 1.90 cm MV Decel Time: 209 msec MV E velocity: 85.00 cm/s MV A velocity: 98.70 cm/s MV E/A ratio:  0.86 Nona Dell MD Electronically signed by Nona Dell MD Signature Date/Time: 01/03/2021/11:38:28 AM    Final      Assessment & Plan    1.  Paroxysmal atrial fibrillation with CHA2DS2-VASc score of 6.  She is in sinus rhythm, continues on IV amiodarone and Toprol-XL Eliquis was resumed yesterday.  2.  Sepsis with UTI and pyelonephritis, also with right ureteral stone.  She is status post right ureteral stent placement.  She remains on antibiotics.  Urine culture positive for E. coli, blood cultures without growth.  Leukocytosis improving.  3.  Hypokalemia, being repleted by primary team.  Plan to discontinue IV amiodarone and switch to oral dosing at 200 mg twice daily.  Continue Toprol-XL.  If blood pressure remains elevated, could consider addition of ARB, would keep her off Cardizem CD.  Reportedly, patient having some hematuria per nursing.  If Eliquis needs to be  held longer per Urology, that would be reasonable.  Signed, Nona Dell, MD  01/05/2021, 9:22 AM

## 2021-01-06 DIAGNOSIS — A419 Sepsis, unspecified organism: Secondary | ICD-10-CM

## 2021-01-06 DIAGNOSIS — N201 Calculus of ureter: Secondary | ICD-10-CM

## 2021-01-06 LAB — MAGNESIUM: Magnesium: 1.8 mg/dL (ref 1.7–2.4)

## 2021-01-06 LAB — CREATININE, SERUM
Creatinine, Ser: 0.53 mg/dL (ref 0.44–1.00)
GFR, Estimated: >60 mL/min (ref >60)

## 2021-01-06 MED ORDER — HYDROCERIN EX CREA
TOPICAL_CREAM | Freq: Two times a day (BID) | CUTANEOUS | Status: DC
  Filled 2021-01-06: qty 113

## 2021-01-06 MED ORDER — CEFDINIR 300 MG PO CAPS
300.0000 mg | ORAL_CAPSULE | Freq: Two times a day (BID) | ORAL | Status: DC
  Administered 2021-01-06 – 2021-01-07 (×2): 300 mg via ORAL
  Filled 2021-01-06 (×2): qty 1

## 2021-01-06 NOTE — Care Management Important Message (Signed)
Important Message  Patient Details  Name: NICLE CONNOLE MRN: [id number] Date of Birth: July 23, 1928   Medicare Important Message Given:  Yes     Corey Harold 01/06/2021, 2:10 PM

## 2021-01-06 NOTE — TOC Transition Note (Signed)
Transition of Care [facility]) - CM/SW Discharge Note   Patient Details  Name: [name] MRN: [id number] Date of Birth: 24-Feb-1929  Transition of Care [facility]) CM/SW Contact:  Shade Flood, LCSW Phone Number: 01/06/2021, 12:36 PM   Clinical Narrative:     Pt admitted from home. MD anticipating dc over the weekend. PT recommended outpt PT at dc. Ref from [facility] (partnered with [employer]) requesting that TOC provide pt with their brochure.   Met with pt to assess for dc needs. Pt reports that she plans to return home at dc. She states that she does not feel that she needs the outpatient PT and she does not want a referral for outpatient. Pt accepted the brochure from [facility] and is aware that someone may be calling her.  Pt asking if MD can recommend something for treatment of her dry elbows. This was relayed to MD.  Pt does not identify any TOC needs for dc. Weekend TOC will be available if needs arise.  Expected Discharge Plan: Home/Self Care Barriers to Discharge: Barriers Resolved   Patient Goals and CMS Choice        Expected Discharge Plan and Services Expected Discharge Plan: Home/Self Care In-house Referral: Clinical Social Work     Living arrangements for the past 2 months: Single Family Home                                      Prior Living Arrangements/Services Living arrangements for the past 2 months: Single Family Home Lives with:: Self Patient language and need for interpreter reviewed:: Yes Do you feel safe going back to the place where you live?: Yes      Need for Family Participation in Patient Care: No (Comment) Care giver support system in place?: No (comment)   Criminal Activity/Legal Involvement Pertinent to Current Situation/Hospitalization: No - Comment as needed  Activities of Daily Living      Permission Sought/Granted                  Emotional Assessment Appearance:: Appears younger than stated  age Attitude/Demeanor/Rapport: Engaged Affect (typically observed): Pleasant Orientation: : Oriented to Self, Oriented to Place, Oriented to  Time, Oriented to Situation Alcohol / Substance Use: Not Applicable Psych Involvement: No (comment)  Admission diagnosis:  Kidney stone [N20.0] UTI (urinary tract infection) [N39.0] Status post peripherally inserted central catheter (PICC) central line placement [Z95.828] Sepsis due to undetermined organism ([place]) [A41.9] Urinary tract infection with hematuria, site unspecified [N39.0, R31.9] Patient Active Problem List   Diagnosis Date Noted   UTI (urinary tract infection) 01/02/2021   Hyperglycemia 01/02/2021   Right flank pain 01/02/2021   SIRS (systemic inflammatory response syndrome) ([place]) 01/02/2021   Sepsis due to undetermined organism ([place]) 01/02/2021   Acute pyelonephritis 01/02/2021   GERD (gastroesophageal reflux disease) 03/31/2019   Dysphagia 12/22/2018   Elevated troponin 09/11/2016   PAF (paroxysmal atrial fibrillation) ([facility])    Hypomagnesemia    Hypertension    Chronic diastolic CHF (congestive heart failure) ([place])    Acute pulmonary edema ([place])    Sepsis secondary to UTI ([place]) 08/22/2016   Renal calculus, right 08/22/2016   Thrombocytopenia ([place]) 08/22/2016   Acute respiratory failure with hypoxia ([place]) 08/22/2016   Ascending aortic aneurysm ([place]) 08/22/2016   Demand ischemia ([place]) 08/22/2016   Atrial fibrillation with rapid ventricular response ([place]) 08/20/2016   Abnormal liver function  Dyspnea    Coronary artery calcification seen on CT scan 07/22/2016   URI (upper respiratory infection) 04/29/2014   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   Essential hypertension, benign 02/20/2013   Hyperlipidemia 02/20/2013   PCP:  Celene Squibb, MD Pharmacy:   [facility], [address] [place] [address] Phone: [phone] Fax: [phone]  [facility] - [place], [place] -  [address] [place] [address] Phone: [phone] Fax: [phone]     Social Determinants of Health (SDOH) Interventions    Readmission Risk Interventions No flowsheet data found.   Final next level of care: Home/Self Care Barriers to Discharge: Barriers Resolved   Patient Goals and CMS Choice        Discharge Placement                       Discharge Plan and Services In-house Referral: Clinical Social Work                                   Social Determinants of Health (SDOH) Interventions     Readmission Risk Interventions No flowsheet data found.

## 2021-01-06 NOTE — Progress Notes (Addendum)
Progress Note  Patient Name: [name] Date of Encounter: 01/06/2021  [facility] Cardiologist: Nona Dell, MD   Subjective   Breathing at baseline. No chest pain or palpitations. She has been walking back and forth to the restroom with her walker.   Inpatient Medications    Scheduled Meds:  amiodarone  200 mg Oral BID   apixaban  5 mg Oral BID   Chlorhexidine Gluconate Cloth  6 each Topical Daily   influenza vaccine adjuvanted  0.5 mL Intramuscular Tomorrow-1000   metoprolol succinate  25 mg Oral Daily   pantoprazole  40 mg Oral Daily   phosphorus  500 mg Oral BID   potassium chloride  20 mEq Oral Daily   pravastatin  40 mg Oral QHS   sodium chloride flush  10-40 mL Intracatheter Q12H   Continuous Infusions:  cefTRIAXone (ROCEPHIN)  IV Stopped (01/05/21 1100)   phenylephrine (NEO-SYNEPHRINE) Adult infusion Stopped (01/02/21 1136)   PRN Meds: acetaminophen, HYDROmorphone (DILAUDID) injection, sodium chloride flush   Vital Signs    Vitals:   01/05/21 2318 01/06/21 0310 01/06/21 0739 01/06/21 0829  BP: (!) 145/83 139/72 (!) 164/93   Pulse: 77 77 78   Resp: 20 20 16    Temp: 98 F (36.7 C) 98.3 F (36.8 C) 99 F (37.2 C)   TempSrc: Oral Oral Oral   SpO2: 99% 97% 98% 95%  Weight:      Height:        Intake/Output Summary (Last 24 hours) at 01/06/2021 0903 Last data filed at 01/06/2021 0815 Gross per 24 hour  Intake 222.03 ml  Output 400 ml  Net -177.97 ml   Last 3 Weights 01/04/2021 01/01/2021 09/13/2020  Weight (lbs) 145 lb 8.1 oz 137 lb 12.6 oz 138 lb  Weight (kg) 66 kg 62.5 kg 62.596 kg      Telemetry    NSR, HR in 70's to 80's.  - Personally Reviewed  ECG    NSR, HR 78 with no acute ST abnormalities.  - Personally Reviewed  Physical Exam   GEN: Pleasant elderly female appearing in no acute distress.   Neck: No JVD Cardiac: RRR, 2/6 systolic murmur along Apex.  Respiratory: Clear to auscultation bilaterally. GI: Soft, nontender,  non-distended  MS: No pitting edema; No deformity. Neuro:  Nonfocal  Psych: Normal affect   Labs    Chemistry Recent Labs  Lab 01/02/21 0848 01/03/21 0531 01/04/21 0442 01/04/21 1401 01/05/21 0442 01/05/21 1349 01/06/21 0633  NA  --  138 141  --  141 142  --   K  --  3.2* 2.7* 2.9* 3.1* 3.2*  --   CL  --  101 99  --  102 104  --   CO2  --  27 33*  --  32 31  --   GLUCOSE  --  105* 115*  --  113* 143*  --   BUN  --  18 16  --  15 12  --   CREATININE  --  0.83 0.71  --  0.59 0.70 0.53  CALCIUM  --  7.8* 8.2*  --  8.1* 8.2*  --   MG 1.4*  --  1.8  --   --   --  1.8  PROT  --  5.5*  --   --   --   --   --   ALBUMIN  --  2.9*  --   --   --   --   --  AST  --  21  --   --   --   --   --   ALT  --  20  --   --   --   --   --   ALKPHOS  --  49  --   --   --   --   --   BILITOT  --  0.9  --   --   --   --   --   GFRNONAA  --  >60 >60  --  >60 >60 >60  ANIONGAP  --  10 9  --  7 7  --      Hematology Recent Labs  Lab 01/03/21 0531 01/04/21 0442 01/05/21 0442  WBC 19.6* 15.8* 9.0  RBC 4.19 4.36 4.05  HGB 12.8 13.5 12.5  HCT 40.4 41.5 38.6  MCV 96.4 95.2 95.3  MCH 30.5 31.0 30.9  MCHC 31.7 32.5 32.4  RDW 13.4 13.4 13.3  PLT 76* 75* 83*    Radiology    No results found.  Cardiac Studies   Echocardiogram: 01/03/2021 IMPRESSIONS    1. Left ventricular ejection fraction, by estimation, is 60 to 65%. The  left ventricle has normal function. The left ventricle has no regional  wall motion abnormalities. Left ventricular diastolic parameters were  normal.   2. Right ventricular systolic function is normal. The right ventricular  size is mildly enlarged. There is severely elevated pulmonary artery  systolic pressure. The estimated right ventricular systolic pressure is  63.4 mmHg.   3. Left atrial size was mildly dilated.   4. Right atrial size was mildly dilated.   5. The mitral valve is degenerative. Mild to moderate mitral valve  regurgitation with very  eccentric jet.   6. Tricuspid valve regurgitation is moderate to severe.   7. The aortic valve is tricuspid. There is mild calcification of the  aortic valve. Aortic valve regurgitation is mild. Mild to moderate aortic  valve sclerosis/calcification is present, without any evidence of aortic  stenosis. Aortic valve mean gradient  measures 7.0 mmHg.   8. Aortic dilatation noted. There is moderate dilatation of the ascending  aorta, measuring 42 mm.   9. The inferior vena cava is dilated in size with <50% respiratory  variability, suggesting right atrial pressure of 15 mmHg.   Patient Profile     87 y.o. female w/ PMH of paroxysmal atrial fibrillation (on Eliquis), coronary calcifications by prior CT, chronic diastolic CHF, thoracic aortic aneurysm, nephrolithiasis and HTN who is currently admitted for urosepsis and pyelonephritis. Cardiology consulted for atrial fibrillation with RVR.   Assessment & Plan   1. Paroxysmal Atrial Fibrillation - She has known paroxysmal atrial fibrillation but experienced intermittent atrial fibrillation with RVR this admission, requiring IV Cardizem and IV Amiodarone. She has been switched to PO Amiodarone 200mg  BID along with being continued on Toprol-XL 25mg  daily. She is maintaining NSR by review of telemetry. Continue current medication regimen for now and would plan to reduce Amiodarone to 200mg  daily as an outpatient.  - She remains on Eliquis 5mg  BID for anticoagulation which is the appropriate dose at this time based off her age, weight and kidney function. By review of notes, she has been cleared to remain on anticoagulation per Urology.   2. Urosepsis/Pyelonephritis - Reports improvement in her symptoms. She remains on Rocephin at this time. Further management per the admitting team.    3. Hypokalemia - K+ was at 3.2 on 9/15 with replacement ordered.  Will recheck BMET with AM labs. Keep Mg ~ 2.0 and K+ ~ 4.0.  4. Valvular Heart Disease - Echo this  admission showed a preserved EF of 60-65% with mild to moderate MR and moderate to severe TR. Continue to follow as an outpatient.    For questions or updates, please contact [facility] Please consult [email or web address] for contact info under        Signed, Ellsworth Lennox, PA-C  01/06/2021, 9:03 AM      Attending note:  Patient seen and examined.  Case discussed with Patrick Jupiter, I agree with her above findings.  Ms. Carlota Raspberry continues to have good control of her atrial fibrillation with no significant breakthrough episodes, now on oral amiodarone along with Toprol-XL.  She is also back on Eliquis.  She is afebrile, heart rate is in the 70s in sinus rhythm by telemetry which I personally reviewed.  Systolic blood pressure ranging 130s to 160s.  Lungs are clear.  Cardiac exam with RRR and 2/6 systolic murmur.  Pertinent lab work includes creatinine 0.53, recent potassium 3.2.  Hemoglobin 12.5.  Platelets 83.  Continue amiodarone 200 mg twice daily for total of 5 days then reduce to 200 mg daily.  Continue Toprol-XL and Eliquis.  If additional blood pressure control needed, could consider ARB, would not restart Cardizem CD.  Replete electrolytes.  Establish follow-up with cardiology in a few weeks after discharge.  Jonelle Sidle, M.D., F.A.C.C.

## 2021-01-06 NOTE — Progress Notes (Signed)
4 Days Post-Op Subjective: No complaints today. Afebrile. Urine clear. Urine culture grew pansensitive e coli.   Objective: Vital signs in last 24 hours: Temp:  [97.8 F (36.6 C)-99 F (37.2 C)] 97.8 F (36.6 C) (09/16 1334) Pulse Rate:  [77-88] 88 (09/16 1334) Resp:  [16-29] 16 (09/16 1334) BP: (139-164)/(72-98) 159/98 (09/16 1334) SpO2:  [95 %-99 %] 96 % (09/16 1334)  Intake/Output from previous day: 09/15 0701 - 09/16 0700 In: 212 [I.V.:112; IV Piggyback:100] Out: 400 [Urine:400] Intake/Output this shift: Total I/O In: 10 [I.V.:10] Out: -   Physical Exam:  General:alert, cooperative, and appears stated age GI: soft, non tender, normal bowel sounds, no palpable masses, no organomegaly, no inguinal hernia Female genitalia: not done Extremities: extremities normal, atraumatic, no cyanosis or edema  Lab Results: Recent Labs    01/04/21 0442 01/05/21 0442  HGB 13.5 12.5  HCT 41.5 38.6   BMET Recent Labs    01/05/21 0442 01/05/21 1349 01/06/21 0633  NA 141 142  --   K 3.1* 3.2*  --   CL 102 104  --   CO2 32 31  --   GLUCOSE 113* 143*  --   BUN 15 12  --   CREATININE 0.59 0.70 0.53  CALCIUM 8.1* 8.2*  --    No results for input(s): LABPT, INR in the last 72 hours.  No results for input(s): LABURIN in the last 72 hours. Results for orders placed or performed during the hospital encounter of 01/01/21  Urine Culture     Status: Abnormal   Collection Time: 01/01/21 11:00 PM   Specimen: Urine, Clean Catch  Result Value Ref Range Status   Specimen Description   Final    URINE, CLEAN CATCH Performed at [facility], [address], [place], [place] [address]    Special Requests   Final    NONE Performed at [facility], [address], [place], [place] [address]    Culture >=100,000 COLONIES/mL ESCHERICHIA COLI (A)  Final   Report Status 01/04/2021 FINAL  Final   Organism ID, Bacteria ESCHERICHIA COLI (A)  Final      Susceptibility   Escherichia coli - MIC*     AMPICILLIN <=2 SENSITIVE Sensitive     CEFAZOLIN <=4 SENSITIVE Sensitive     CEFEPIME <=0.12 SENSITIVE Sensitive     CEFTRIAXONE <=0.25 SENSITIVE Sensitive     CIPROFLOXACIN <=0.25 SENSITIVE Sensitive     GENTAMICIN <=1 SENSITIVE Sensitive     IMIPENEM <=0.25 SENSITIVE Sensitive     NITROFURANTOIN <=16 SENSITIVE Sensitive     TRIMETH/SULFA <=20 SENSITIVE Sensitive     AMPICILLIN/SULBACTAM <=2 SENSITIVE Sensitive     PIP/TAZO <=4 SENSITIVE Sensitive     * >=100,000 COLONIES/mL ESCHERICHIA COLI  Resp Panel by RT-PCR (Flu A&B, Covid) Nasopharyngeal Swab     Status: None   Collection Time: 01/02/21  4:17 AM   Specimen: Nasopharyngeal Swab; Nasopharyngeal(NP) swabs in vial transport medium  Result Value Ref Range Status   SARS Coronavirus 2 by RT PCR NEGATIVE NEGATIVE Final    Comment: (NOTE) SARS-CoV-2 target nucleic acids are NOT DETECTED.  The SARS-CoV-2 RNA is generally detectable in upper respiratory specimens during the acute phase of infection. The lowest concentration of SARS-CoV-2 viral copies this assay can detect is 138 copies/mL. A negative result does not preclude SARS-Cov-2 infection and should not be used as the sole basis for treatment or other patient management decisions. A negative result may occur with  improper specimen collection/handling, submission  of specimen other than nasopharyngeal swab, presence of viral mutation(s) within the areas targeted by this assay, and inadequate number of viral copies(<138 copies/mL). A negative result must be combined with clinical observations, patient history, and epidemiological information. The expected result is Negative.  Fact Sheet for Patients:  BloggerCourse.com  Fact Sheet for Healthcare Providers:  SeriousBroker.it  This test is no t yet approved or cleared by the [place] FDA and  has been authorized for detection and/or diagnosis of SARS-CoV-2 by FDA under  an Emergency Use Authorization (EUA). This EUA will remain  in effect (meaning this test can be used) for the duration of the COVID-19 declaration under Section 564(b)(1) of the Act, 21 U.S.C.section 360bbb-3(b)(1), unless the authorization is terminated  or revoked sooner.       Influenza A by PCR NEGATIVE NEGATIVE Final   Influenza B by PCR NEGATIVE NEGATIVE Final    Comment: (NOTE) The Xpert Xpress SARS-CoV-2/FLU/RSV plus assay is intended as an aid in the diagnosis of influenza from Nasopharyngeal swab specimens and should not be used as a sole basis for treatment. Nasal washings and aspirates are unacceptable for Xpert Xpress SARS-CoV-2/FLU/RSV testing.  Fact Sheet for Patients: BloggerCourse.com  Fact Sheet for Healthcare Providers: SeriousBroker.it  This test is not yet approved or cleared by the [place] FDA and has been authorized for detection and/or diagnosis of SARS-CoV-2 by FDA under an Emergency Use Authorization (EUA). This EUA will remain in effect (meaning this test can be used) for the duration of the COVID-19 declaration under Section 564(b)(1) of the Act, 21 U.S.C. section 360bbb-3(b)(1), unless the authorization is terminated or revoked.  Performed at [facility], [address], [place], [place] [address]   Culture, blood (single)     Status: None (Preliminary result)   Collection Time: 01/02/21  4:46 AM   Specimen: Left Antecubital; Blood  Result Value Ref Range Status   Specimen Description LEFT ANTECUBITAL  Final   Special Requests   Final    BOTTLES DRAWN AEROBIC AND ANAEROBIC Blood Culture adequate volume   Culture   Final    NO GROWTH 3 DAYS Performed at [facility], [address], [place], [place] [address]    Report Status PENDING  Incomplete  MRSA Next Gen by PCR, Nasal     Status: None   Collection Time: 01/02/21  3:46 PM   Specimen: Nasal Mucosa; Nasal Swab  Result Value Ref Range  Status   MRSA by PCR Next Gen NOT DETECTED NOT DETECTED Final    Comment: (NOTE) The GeneXpert MRSA Assay (FDA approved for NASAL specimens only), is one component of a comprehensive MRSA colonization surveillance program. It is not intended to diagnose MRSA infection nor to guide or monitor treatment for MRSA infections. Test performance is not FDA approved in patients less than 72 years old. Performed at [facility], [address], [place], [place] [address]   C Difficile Quick Screen w PCR reflex     Status: None   Collection Time: 01/03/21 12:55 PM   Specimen: STOOL  Result Value Ref Range Status   C Diff antigen NEGATIVE NEGATIVE Final   C Diff toxin NEGATIVE NEGATIVE Final   C Diff interpretation No C. difficile detected.  Final    Comment: Performed at [facility], [address] [place], [place] [address]    Studies/Results: No results found.  Assessment/Plan: POD#4 ureteral stent placement for sepsis from a urinary source.  Please transition antibiotics to ceftin 250mg  BID for  7 days  Patient is scheduled for second surgery and my office will contact her with additional information   LOS: 4 days   Wilkie Aye 01/06/2021, 2:12 PM

## 2021-01-06 NOTE — Progress Notes (Signed)
PROGRESS NOTE  [name] ZOX:[id number] DOB: 1929-02-14 DOA: 01/01/2021 PCP: Benita Stabile, MD   Brief History:  87 year old female with a history of paroxysmal atrial fibrillation, diastolic CHF, hypertension, hyperlipidemia, coronary arterydisease, ascending aortic aneurysm, and nephrolithiasis status post lithotripsy May 2018 presenting with right-sided abdominal pain/right flank pain that began around 1 PM on 01/01/2021.  The patient had been in her usual state of health prior to the beginning of her episodes.  She has some nausea without emesis.  She has some subjective fevers and chills.  She denies any headache, neck pain, chest pain, shortness breath, coughing, hemoptysis, diarrhea, hematochezia, melena. In the emergency department, the patient was febrile up to 101.3 F with tachycardia 120.  She was hemodynamically stable with oxygen saturation 100% on room air.  BMP shows sodium 137, potassium 3.7, serum creatinine of 0.91.  WBC 9.2, hemoglobin 16.4, platelets143K.  Urology was consulted, and there are plans for Dr. Ronne Binning to see the patient for possible ureteral stent placement.   Assessment/Plan: Severe Sepsis secondary to E. Coli -due to UTI -Sepsis present at time of admission with fever, tachycardia, positive source of infection and elevated lactic acid.  -Continue maintaining adequate hydration. -Will transition to oral antibiotics; follow tolerance and if stable discharge home in a.m. -lactate peaked 4.6; now back to normal range.   Pyelonephritis/ureteral stone -Urology consulted; appreciate recommendations -01/01/21 CT renal--7 mm distal right ureteral stone with right perinephric stranding and right hydronephrosis -Oral antibiotics has been started and will assess for tolerance prior to discharge. -Vancomycin and Zosyn has been discontinued. -Continue to maintain adequate hydration -Foley catheter has been safely removed and patient able to void without  problems or complaints. -Mild hematuria appreciated on today's examination; this is expected and okay to continue anticoagulation as per urology recommendation.  Paroxysmal Atrial Fibrillation with RVR -metoprolol and diltiazem were held initially due to soft BPs and npo status. -started on amiodarone drip>>converted to sinus and currently maintaining her rate well controlled. -Following cardiology recommendations will continue oral amiodarone at current dose and the use of metoprolol. -Continue apixaban for secondary prevention.  Acute Respiratory Failure with Hypoxia -due to pulmonary edema -stable on 2L and denying sob -Continue attempting to wean off oxygen supplementation.   -Lasix 40 mg IV x 1 -01/03/21 Echo--EF 60-65%, no WMA, severe elevated PASP; mod-severe TR -personally reviewed CXR--increased interstitial markings   Thrombocytopenia -This has been chronic in the past dating back to April 2018 -Monitor for signs of bleeding   Acute on Chronic diastolic CHF -Appears clinically euvolemic currently -12/22/2018 echo EF >65%; mild MR, no WMA -01/03/21 Echo--EF 60-65%, no WMA, severe elevated PASP; mod-severe TR -lasix 40 mg IV x 1 due to vascular congestion as mentioned above. -Continue to follow daily weights and strict I's and O's.   Coronary artery disease -No chest pain -Personally reviewed EKG--- sinus rhythm, no concerning ST-T wave change   Ascending aortic aneurysm -Measuring 5.2 cm on last imaging -Patient follows Dr. Emi Holes to be a poor surgical candidate -Continue outpatient follow-up.   Hyperlipidemia -Continue the use of statin  Hypomagnesemia/Hypophosphatemia/Hypokalemia -Continue to follow electrolytes and further replete as needed. -Goal is for potassium above 4 and magnesium above 2. -Potassium currently 3.2    Status is: Inpatient   Remains inpatient appropriate because:Inpatient level of care appropriate due to severity of illness    Dispo: The patient is from: Home  Anticipated d/c is to: Home              Patient currently is not medically stable to d/c.  Will transfer to telemetry bed.              Difficult to place patient No         Family Communication:   son updated on 9/14   Consultants:  urology   Code Status:  FULL    DVT Prophylaxis:  apixaban     Procedures: As Listed in Progress Note Above   Antibiotics: Ceftriaxone 9/11>>      Subjective: No fever, no chest pain, no nausea, no vomiting.  2 L nasal cannula in place with good oxygen saturation.  Patient able to void after Foley catheter removed.  In no acute distress.  Objective: Vitals:   01/06/21 0310 01/06/21 0739 01/06/21 0829 01/06/21 1334  BP: 139/72 (!) 164/93  (!) 159/98  Pulse: 77 78  88  Resp: 20 16  16   Temp: 98.3 F (36.8 C) 99 F (37.2 C)  97.8 F (36.6 C)  TempSrc: Oral Oral  Oral  SpO2: 97% 98% 95% 96%  Weight:      Height:        Intake/Output Summary (Last 24 hours) at 01/06/2021 1755 Last data filed at 01/06/2021 1300 Gross per 24 hour  Intake 442.03 ml  Output 400 ml  Net 42.03 ml   Weight change:   Exam: General exam: Alert, awake, oriented x 3; no chest pain, no nausea, no vomiting.  Denies dysuria.  Has been able to void after Foley catheter removed. Respiratory system: Decreased breath sounds at the bases; no wheezing, no crackles; 2 L nasal cannula in place. Cardiovascular system:Rate controlled; No murmurs, rubs or gallops. Gastrointestinal system: Abdomen is nondistended, soft and nontender. No organomegaly or masses felt. Normal bowel sounds heard. Central nervous system: Alert and oriented. No focal neurological deficits. Extremities: No cyanosis or clubbing. Skin: No petechiae. Psychiatry: Judgement and insight appear normal. Mood & affect appropriate.   Data Reviewed: I have personally reviewed following labs and imaging studies  Basic Metabolic Panel: Recent Labs  Lab  01/02/21 0057 01/02/21 0848 01/03/21 0531 01/04/21 0442 01/04/21 1401 01/05/21 0442 01/05/21 1349 01/06/21 0633  NA 137  --  138 141  --  141 142  --   K 3.7  --  3.2* 2.7* 2.9* 3.1* 3.2*  --   CL 95*  --  101 99  --  102 104  --   CO2 27  --  27 33*  --  32 31  --   GLUCOSE 158*  --  105* 115*  --  113* 143*  --   BUN 17  --  18 16  --  15 12  --   CREATININE 0.91  --  0.83 0.71  --  0.59 0.70 0.53  CALCIUM 9.8  --  7.8* 8.2*  --  8.1* 8.2*  --   MG  --  1.4*  --  1.8  --   --   --  1.8  PHOS  --  2.2*  --  2.4*  --   --   --   --    Liver Function Tests: Recent Labs  Lab 01/03/21 0531  AST 21  ALT 20  ALKPHOS 49  BILITOT 0.9  PROT 5.5*  ALBUMIN 2.9*    Coagulation Profile: Recent Labs  Lab 01/02/21 0848  INR 1.4*   CBC:  Recent Labs  Lab 01/02/21 0057 01/03/21 0531 01/04/21 0442 01/05/21 0442  WBC 9.2 19.6* 15.8* 9.0  NEUTROABS 8.5*  --   --   --   HGB 16.4* 12.8 13.5 12.5  HCT 49.4* 40.4 41.5 38.6  MCV 93.4 96.4 95.2 95.3  PLT 143* 76* 75* 83*    Urine analysis:    Component Value Date/Time   COLORURINE YELLOW 01/01/2021 2300   APPEARANCEUR HAZY (A) 01/01/2021 2300   APPEARANCEUR Hazy (A) 11/15/2020 0944   LABSPEC 1.025 01/01/2021 2300   PHURINE 6.0 01/01/2021 2300   GLUCOSEU NEGATIVE 01/01/2021 2300   HGBUR LARGE (A) 01/01/2021 2300   BILIRUBINUR NEGATIVE 01/01/2021 2300   BILIRUBINUR Negative 11/15/2020 0944   KETONESUR >80 (A) 01/01/2021 2300   PROTEINUR 30 (A) 01/01/2021 2300   UROBILINOGEN negative (A) 05/19/2019 0844   NITRITE POSITIVE (A) 01/01/2021 2300   LEUKOCYTESUR SMALL (A) 01/01/2021 2300   Sepsis Labs:  Recent Results (from the past 240 hour(s))  Urine Culture     Status: Abnormal   Collection Time: 01/01/21 11:00 PM   Specimen: Urine, Clean Catch  Result Value Ref Range Status   Specimen Description   Final    URINE, CLEAN CATCH Performed at [facility], [address], [place], [place] [address]    Special Requests    Final    NONE Performed at [facility], [address], [place], [place] [address]    Culture >=100,000 COLONIES/mL ESCHERICHIA COLI (A)  Final   Report Status 01/04/2021 FINAL  Final   Organism ID, Bacteria ESCHERICHIA COLI (A)  Final      Susceptibility   Escherichia coli - MIC*    AMPICILLIN <=2 SENSITIVE Sensitive     CEFAZOLIN <=4 SENSITIVE Sensitive     CEFEPIME <=0.12 SENSITIVE Sensitive     CEFTRIAXONE <=0.25 SENSITIVE Sensitive     CIPROFLOXACIN <=0.25 SENSITIVE Sensitive     GENTAMICIN <=1 SENSITIVE Sensitive     IMIPENEM <=0.25 SENSITIVE Sensitive     NITROFURANTOIN <=16 SENSITIVE Sensitive     TRIMETH/SULFA <=20 SENSITIVE Sensitive     AMPICILLIN/SULBACTAM <=2 SENSITIVE Sensitive     PIP/TAZO <=4 SENSITIVE Sensitive     * >=100,000 COLONIES/mL ESCHERICHIA COLI  Resp Panel by RT-PCR (Flu A&B, Covid) Nasopharyngeal Swab     Status: None   Collection Time: 01/02/21  4:17 AM   Specimen: Nasopharyngeal Swab; Nasopharyngeal(NP) swabs in vial transport medium  Result Value Ref Range Status   SARS Coronavirus 2 by RT PCR NEGATIVE NEGATIVE Final    Comment: (NOTE) SARS-CoV-2 target nucleic acids are NOT DETECTED.  The SARS-CoV-2 RNA is generally detectable in upper respiratory specimens during the acute phase of infection. The lowest concentration of SARS-CoV-2 viral copies this assay can detect is 138 copies/mL. A negative result does not preclude SARS-Cov-2 infection and should not be used as the sole basis for treatment or other patient management decisions. A negative result may occur with  improper specimen collection/handling, submission of specimen other than nasopharyngeal swab, presence of viral mutation(s) within the areas targeted by this assay, and inadequate number of viral copies(<138 copies/mL). A negative result must be combined with clinical observations, patient history, and epidemiological information. The expected result is Negative.  Fact Sheet for  Patients:  BloggerCourse.com  Fact Sheet for Healthcare Providers:  SeriousBroker.it  This test is no t yet approved or cleared by the [place] FDA and  has been authorized for detection and/or diagnosis of SARS-CoV-2 by FDA under  an Emergency Use Authorization (EUA). This EUA will remain  in effect (meaning this test can be used) for the duration of the COVID-19 declaration under Section 564(b)(1) of the Act, 21 U.S.C.section 360bbb-3(b)(1), unless the authorization is terminated  or revoked sooner.       Influenza A by PCR NEGATIVE NEGATIVE Final   Influenza B by PCR NEGATIVE NEGATIVE Final    Comment: (NOTE) The Xpert Xpress SARS-CoV-2/FLU/RSV plus assay is intended as an aid in the diagnosis of influenza from Nasopharyngeal swab specimens and should not be used as a sole basis for treatment. Nasal washings and aspirates are unacceptable for Xpert Xpress SARS-CoV-2/FLU/RSV testing.  Fact Sheet for Patients: BloggerCourse.com  Fact Sheet for Healthcare Providers: SeriousBroker.it  This test is not yet approved or cleared by the [place] FDA and has been authorized for detection and/or diagnosis of SARS-CoV-2 by FDA under an Emergency Use Authorization (EUA). This EUA will remain in effect (meaning this test can be used) for the duration of the COVID-19 declaration under Section 564(b)(1) of the Act, 21 U.S.C. section 360bbb-3(b)(1), unless the authorization is terminated or revoked.  Performed at [facility], [address] [place], [place] [address]   Culture, blood (single)     Status: None (Preliminary result)   Collection Time: 01/02/21  4:46 AM   Specimen: Left Antecubital; Blood  Result Value Ref Range Status   Specimen Description LEFT ANTECUBITAL  Final   Special Requests   Final    BOTTLES DRAWN AEROBIC AND ANAEROBIC Blood Culture adequate volume    Culture   Final    NO GROWTH 3 DAYS Performed at [facility], [address] [place], [place] [address]    Report Status PENDING  Incomplete  MRSA Next Gen by PCR, Nasal     Status: None   Collection Time: 01/02/21  3:46 PM   Specimen: Nasal Mucosa; Nasal Swab  Result Value Ref Range Status   MRSA by PCR Next Gen NOT DETECTED NOT DETECTED Final    Comment: (NOTE) The GeneXpert MRSA Assay (FDA approved for NASAL specimens only), is one component of a comprehensive MRSA colonization surveillance program. It is not intended to diagnose MRSA infection nor to guide or monitor treatment for MRSA infections. Test performance is not FDA approved in patients less than 38 years old. Performed at [facility], [address], [place], [place] [address]   C Difficile Quick Screen w PCR reflex     Status: None   Collection Time: 01/03/21 12:55 PM   Specimen: STOOL  Result Value Ref Range Status   C Diff antigen NEGATIVE NEGATIVE Final   C Diff toxin NEGATIVE NEGATIVE Final   C Diff interpretation No C. difficile detected.  Final    Comment: Performed at [facility], [address], [place], [place] [address]     Scheduled Meds:  amiodarone  200 mg Oral BID   apixaban  5 mg Oral BID   cefdinir  300 mg Oral Q12H   Chlorhexidine Gluconate Cloth  6 each Topical Daily   hydrocerin   Topical BID   metoprolol succinate  25 mg Oral Daily   pantoprazole  40 mg Oral Daily   phosphorus  500 mg Oral BID   potassium chloride  20 mEq Oral Daily   pravastatin  40 mg Oral QHS   sodium chloride flush  10-40 mL Intracatheter Q12H   Continuous Infusions:  phenylephrine (NEO-SYNEPHRINE) Adult infusion Stopped (01/02/21 1136)    Procedures/Studies: DG CHEST  PORT 1 VIEW  Result Date: 01/02/2021 CLINICAL DATA:  PICC line placement EXAM: PORTABLE CHEST 1 VIEW COMPARISON:  08/27/2020 FINDINGS: The heart is enlarged. There is atherosclerosis and tortuosity of the aorta. There is interstitial pulmonary edema  and bilateral pleural effusions and dependent atelectasis, left more than right. Basilar pneumonia not excluded. Right arm PICC tip extends to the SVC above the right atrium. Monitor lead overlies the distal catheter in the exact location of the tip can not be established. IMPRESSION: Congestive heart failure pattern. Effusions and basilar atelectasis left worse than right. Right arm PICC tip is at least to the level of the SVC above the right atrium. The exact termination can not be established because of an overlying monitor lead. Electronically Signed   By: Paulina Fusi M.D.   On: 01/02/2021 13:03   DG C-Arm 1-60 Min-No Report  Result Date: 01/02/2021 Fluoroscopy was utilized by the requesting physician.  No radiographic interpretation.   ECHOCARDIOGRAM COMPLETE  Result Date: 01/03/2021    ECHOCARDIOGRAM REPORT   Patient Name:   Girl A Campoli Date of Exam: 01/03/2021 Medical Rec #:  [id number]       Height:       63.0 in Accession #:    [id number]      Weight:       137.8 lb Date of Birth:  25-Jul-1928        BSA:          1.650 m Patient Age:    89 years        BP:           148/75 mmHg Patient Gender: F               HR:           79 bpm. [facility] Procedure: 2D Echo, Color Doppler and Cardiac Doppler Indications:    Atrial Fibrillation  History:        Patient has prior history of Echocardiogram examinations, most                 recent 12/22/2018. CHF, CAD, Arrythmias:Atrial Fibrillation,                 Signs/Symptoms:Dyspnea; Risk Factors:Hypertension and                 Dyslipidemia. Ascending aortic aneurysm.  Sonographer:    Mikki Harbor Referring Phys: [id number] SAMUEL G MCDOWELL IMPRESSIONS  1. Left ventricular ejection fraction, by estimation, is 60 to 65%. The left ventricle has normal function. The left ventricle has no regional wall motion abnormalities. Left ventricular diastolic parameters were normal.  2. Right ventricular systolic function is normal. The right ventricular  size is mildly enlarged. There is severely elevated pulmonary artery systolic pressure. The estimated right ventricular systolic pressure is 63.4 mmHg.  3. Left atrial size was mildly dilated.  4. Right atrial size was mildly dilated.  5. The mitral valve is degenerative. Mild to moderate mitral valve regurgitation with very eccentric jet.  6. Tricuspid valve regurgitation is moderate to severe.  7. The aortic valve is tricuspid. There is mild calcification of the aortic valve. Aortic valve regurgitation is mild. Mild to moderate aortic valve sclerosis/calcification is present, without any evidence of aortic stenosis. Aortic valve mean gradient measures 7.0 mmHg.  8. Aortic dilatation noted. There is moderate dilatation of the ascending aorta, measuring 42 mm.  9. The inferior vena cava is dilated in size with <50% respiratory  variability, suggesting right atrial pressure of 15 mmHg. Comparison(s): Prior images reviewed side by side. Degree of tricuspid regurgitation and elevated RVSP more prominent in comparison. FINDINGS  Left Ventricle: Left ventricular ejection fraction, by estimation, is 60 to 65%. The left ventricle has normal function. The left ventricle has no regional wall motion abnormalities. The left ventricular internal cavity size was normal in size. There is  borderline left ventricular hypertrophy. Left ventricular diastolic parameters were normal. Right Ventricle: The right ventricular size is mildly enlarged. No increase in right ventricular wall thickness. Right ventricular systolic function is normal. There is severely elevated pulmonary artery systolic pressure. The tricuspid regurgitant velocity is 3.48 m/s, and with an assumed right atrial pressure of 15 mmHg, the estimated right ventricular systolic pressure is 63.4 mmHg. Left Atrium: Left atrial size was mildly dilated. Right Atrium: Right atrial size was mildly dilated. Pericardium: There is no evidence of pericardial effusion. Mitral  Valve: The mitral valve is degenerative in appearance. There is mild calcification of the mitral valve leaflet(s). Mild mitral annular calcification. Mild to moderate mitral valve regurgitation. No evidence of mitral valve stenosis. MV peak gradient, 5.0 mmHg. The mean mitral valve gradient is 2.0 mmHg. Tricuspid Valve: The tricuspid valve is grossly normal. Tricuspid valve regurgitation is moderate to severe. Aortic Valve: The aortic valve is tricuspid. There is mild calcification of the aortic valve. There is mild aortic valve annular calcification. Aortic valve regurgitation is mild. Aortic regurgitation PHT measures 312 msec. Mild to moderate aortic valve sclerosis/calcification is present, without any evidence of aortic stenosis. Aortic valve mean gradient measures 7.0 mmHg. Aortic valve peak gradient measures 11.6 mmHg. Aortic valve area, by VTI measures 1.78 cm. Pulmonic Valve: The pulmonic valve was grossly normal. Pulmonic valve regurgitation is mild. Aorta: The aortic root is normal in size and structure and aortic dilatation noted. There is moderate dilatation of the ascending aorta, measuring 42 mm. Venous: The inferior vena cava is dilated in size with less than 50% respiratory variability, suggesting right atrial pressure of 15 mmHg. IAS/Shunts: No atrial level shunt detected by color flow Doppler.  LEFT VENTRICLE PLAX 2D LVIDd:         4.48 cm     Diastology LVIDs:         2.64 cm     LV e' medial:    11.90 cm/s LV PW:         0.90 cm     LV E/e' medial:  7.1 LV IVS:        1.07 cm     LV e' lateral:   13.10 cm/s LVOT diam:     1.90 cm     LV E/e' lateral: 6.5 LV SV:         63 LV SV Index:   38 LVOT Area:     2.84 cm  LV Volumes (MOD) LV vol d, MOD A2C: 48.9 ml LV vol d, MOD A4C: 47.2 ml LV vol s, MOD A2C: 19.9 ml LV vol s, MOD A4C: 19.4 ml LV SV MOD A2C:     29.0 ml LV SV MOD A4C:     47.2 ml LV SV MOD BP:      28.3 ml RIGHT VENTRICLE RV Basal diam:  4.14 cm RV Mid diam:    3.78 cm RV S prime:      16.30 cm/s TAPSE (M-mode): 2.0 cm LEFT ATRIUM             Index  RIGHT ATRIUM           Index LA diam:        3.80 cm 2.30 cm/m  RA Area:     18.90 cm LA Vol (A2C):   56.9 ml 34.48 ml/m RA Volume:   56.40 ml  34.17 ml/m LA Vol (A4C):   60.8 ml 36.84 ml/m LA Biplane Vol: 60.3 ml 36.54 ml/m  AORTIC VALVE AV Area (Vmax):    1.77 cm AV Area (Vmean):   1.62 cm AV Area (VTI):     1.78 cm AV Vmax:           170.00 cm/s AV Vmean:          125.000 cm/s AV VTI:            0.352 m AV Peak Grad:      11.6 mmHg AV Mean Grad:      7.0 mmHg LVOT Vmax:         106.00 cm/s LVOT Vmean:        71.300 cm/s LVOT VTI:          0.221 m LVOT/AV VTI ratio: 0.63 AI PHT:            312 msec  AORTA Ao Root diam: 3.20 cm Ao Asc diam:  4.20 cm MITRAL VALVE               TRICUSPID VALVE MV Area (PHT): 3.63 cm    TR Peak grad:   48.4 mmHg MV Area VTI:   2.41 cm    TR Vmax:        348.00 cm/s MV Peak grad:  5.0 mmHg MV Mean grad:  2.0 mmHg    SHUNTS MV Vmax:       1.12 m/s    Systemic VTI:  0.22 m MV Vmean:      63.9 cm/s   Systemic Diam: 1.90 cm MV Decel Time: 209 msec MV E velocity: 85.00 cm/s MV A velocity: 98.70 cm/s MV E/A ratio:  0.86 Nona Dell MD Electronically signed by Nona Dell MD Signature Date/Time: 01/03/2021/11:38:28 AM    Final    CT RENAL STONE STUDY  Result Date: 01/02/2021 CLINICAL DATA:  Flank pain.  Concern for kidney stone. EXAM: CT ABDOMEN AND PELVIS WITHOUT CONTRAST TECHNIQUE: Multidetector CT imaging of the abdomen and pelvis was performed following the standard protocol without IV contrast. COMPARISON:  CT of the abdomen pelvis dated 08/19/2016. FINDINGS: Evaluation of this exam is limited in the absence of intravenous contrast. Lower chest: Minimal bibasilar atelectasis. There is coronary vascular calcification. No intra-abdominal free air or free fluid. Hepatobiliary: The liver is unremarkable. No intrahepatic biliary ductal dilatation. The gallbladder is unremarkable. Pancreas: The pancreas  is unremarkable. Spleen: Normal in size without focal abnormality. Adrenals/Urinary Tract: The adrenal glands unremarkable. There is a 7 mm stone in the distal right ureter with mild right hydronephrosis. There is mild right perinephric stranding. Correlation with urinalysis recommended to exclude UTI. There is a 3 cm left renal cyst. No hydronephrosis or nephrolithiasis on the left the left ureter and urinary bladder appear unremarkable. Stomach/Bowel: Moderate size hiatal hernia. There is sigmoid diverticulosis without active inflammatory changes. There is no bowel obstruction or active inflammation. The appendix is normal. Vascular/Lymphatic: Advanced aortoiliac atherosclerotic disease. The IVC is grossly unremarkable. No portal venous gas. There is no adenopathy. Reproductive: The uterus is anteverted and grossly unremarkable. No adnexal masses. Other: Induration of the subcutaneous soft tissues of the right gluteal  region. No fluid collection. Musculoskeletal: Osteopenia with degenerative changes of the spine. Grade 1 L4-L5 anterolisthesis. No acute osseous pathology. IMPRESSION: 1. A 7 mm distal right ureteral stone with mild right hydronephrosis. Correlation with urinalysis recommended to exclude UTI. 2. Sigmoid diverticulosis. No bowel obstruction. Normal appendix. 3. Aortic Atherosclerosis (ICD10-I70.0). Electronically Signed   By: Elgie Collard M.D.   On: 01/02/2021 01:34   [place] EKG SITE RITE  Result Date: 01/02/2021 If Site Rite image not attached, placement could not be confirmed due to current cardiac rhythm.   Vassie Loll, MD  [facility]  If 7PM-7AM, please contact night-coverage [email or web address] Password [email or web address] 01/06/2021, 5:55 PM   LOS: 4 days

## 2021-01-07 DIAGNOSIS — Z95828 Presence of other vascular implants and grafts: Secondary | ICD-10-CM

## 2021-01-07 DIAGNOSIS — I5032 Chronic diastolic (congestive) heart failure: Secondary | ICD-10-CM

## 2021-01-07 LAB — BASIC METABOLIC PANEL
Anion gap: 13 (ref 5–15)
BUN: 8 mg/dL (ref 8–23)
CO2: 25 mmol/L (ref 22–32)
Calcium: 8.6 mg/dL — ABNORMAL LOW (ref 8.9–10.3)
Chloride: 104 mmol/L (ref 98–111)
Creatinine, Ser: 0.57 mg/dL (ref 0.44–1.00)
GFR, Estimated: >60 mL/min (ref >60)
Glucose, Bld: 138 mg/dL — ABNORMAL HIGH (ref 70–99)
Potassium: 3.4 mmol/L — ABNORMAL LOW (ref 3.5–5.1)
Sodium: 142 mmol/L (ref 135–145)

## 2021-01-07 LAB — CULTURE, BLOOD (SINGLE)
Culture: NO GROWTH
Special Requests: ADEQUATE

## 2021-01-07 MED ORDER — AMIODARONE HCL 200 MG PO TABS: 200.0000 mg | ORAL_TABLET | Freq: Two times a day (BID) | ORAL | 1 refills | Status: DC

## 2021-01-07 MED ORDER — METOPROLOL TARTRATE 5 MG/5ML IV SOLN
5.0000 mg | Freq: Once | INTRAVENOUS | Status: AC
  Administered 2021-01-07: 5 mg via INTRAVENOUS
  Filled 2021-01-07: qty 5

## 2021-01-07 MED ORDER — CEFDINIR 300 MG PO CAPS: 300.0000 mg | ORAL_CAPSULE | Freq: Two times a day (BID) | ORAL | 0 refills | Status: AC

## 2021-01-07 MED ORDER — POTASSIUM CHLORIDE CRYS ER 20 MEQ PO TBCR: 20.0000 meq | EXTENDED_RELEASE_TABLET | Freq: Every day | ORAL | 1 refills | Status: DC

## 2021-01-07 MED ORDER — DILTIAZEM HCL 25 MG/5ML IV SOLN: 10.0000 mg | Freq: Once | INTRAVENOUS | Status: DC

## 2021-01-07 MED ORDER — SODIUM CHLORIDE 0.9 % IV BOLUS
500.0000 mL | Freq: Once | INTRAVENOUS | Status: AC
  Administered 2021-01-07: 500 mL via INTRAVENOUS

## 2021-01-07 MED ORDER — METOPROLOL SUCCINATE ER 25 MG PO TB24: 25.0000 mg | ORAL_TABLET | Freq: Two times a day (BID) | ORAL | 2 refills | Status: DC

## 2021-01-07 NOTE — Discharge Summary (Signed)
Physician Discharge Summary  [name]:[id number] DOB: 02-03-1929 DOA: 01/01/2021  PCP: Benita Stabile, MD  Admit date: 01/01/2021 Discharge date: 01/07/2021  Time spent: 35 minutes  Recommendations for Outpatient Follow-up:  Repeat basic metabolic panel to evaluate lites renal function Reassessed blood pressure and further adjust antihypertensive regimen as needed Make sure patient has follow-up with urology and cardiology service as instructed.  Discharge Diagnoses:  Active Problems:   Essential hypertension, benign   Hyperlipidemia   Atrial fibrillation with rapid ventricular response (HCC)   Renal calculus, right   Thrombocytopenia (HCC)   GERD (gastroesophageal reflux disease)   UTI (urinary tract infection)   Hyperglycemia   Right flank pain   Sepsis due to E. Coli UTI (HCC)   Acute pyelonephritis   Status post peripherally inserted central catheter (PICC) central line placement   Discharge Condition: Stable and improved.  Discharged home with instruction to follow-up with PCP, urology and cardiology service as an outpatient.  CODE STATUS: Full code  Diet recommendation: Heart healthy/low-sodium diet.  Filed Weights   01/01/21 2245 01/04/21 0531  Weight: 62.5 kg 66 kg    History of present illness:  87 year old female with a history of paroxysmal atrial fibrillation, diastolic CHF, hypertension, hyperlipidemia, coronary arterydisease, ascending aortic aneurysm, and nephrolithiasis status post lithotripsy May 2018 presenting with right-sided abdominal pain/right flank pain that began around 1 PM on 01/01/2021.  The patient had been in her usual state of health prior to the beginning of her episodes.  She has some nausea without emesis.  She has some subjective fevers and chills.  She denies any headache, neck pain, chest pain, shortness breath, coughing, hemoptysis, diarrhea, hematochezia, melena. In the emergency department, the patient was febrile up to 101.3 F  with tachycardia 120.  She was hemodynamically stable with oxygen saturation 100% on room air.  BMP shows sodium 137, potassium 3.7, serum creatinine of 0.91.  WBC 9.2, hemoglobin 16.4, platelets143K.  Urology was consulted, and there are plans for Dr. Ronne Binning to see the patient for possible ureteral stent placement.  Hospital Course:  Severe Sepsis secondary to E. Coli -due to UTI -Sepsis present at time of admission with fever, tachycardia, positive source of infection and elevated lactic acid.  -Continue maintaining adequate hydration. -Will transition to oral antibiotics; and treat for 5 more days at discharge. -lactate peaked 4.6; now back to normal range.   Pyelonephritis/ureteral stone -Urology consulted; appreciate recommendations -01/01/21 CT renal--7 mm distal right ureteral stone with right perinephric stranding and right hydronephrosis -Oral antibiotics has been started and will assess for tolerance prior to discharge. -Vancomycin and Zosyn has been discontinued. -Continue to maintain adequate hydration -Foley catheter has been safely removed and patient able to void without problems or complaints. -Mild hematuria appreciated on today's examination; this is expected and okay to continue anticoagulation as per urology recommendation.   Paroxysmal Atrial Fibrillation with RVR -metoprolol and diltiazem were held initially due to soft BPs and npo status. -started on amiodarone drip>>converted to sinus and currently maintaining her rate well controlled. -Following cardiology recommendations will continue oral amiodarone at current dose and the use of adjusted dose of metoprolol. -Continue apixaban for secondary prevention.   Acute Respiratory Failure with Hypoxia -due to pulmonary edema -stable and not requiring oxygen supplementation at discharge. -Continue attempting to wean off oxygen supplementation.   -Lasix 40 mg IV x 1 given during hospitalization. -01/03/21 Echo--EF  60-65%, no WMA, severe elevated PASP; mod-severe TR -personally reviewed CXR--increased interstitial markings appreciated;  at discharge patient denies orthopnea. -Resume as needed Lasix as per home regimen.   Thrombocytopenia -This has been chronic in the past dating back to April 2018 -Monitor for signs of bleeding -Repeat CBC at follow-up visit to follow platelets trend.   Acute on Chronic diastolic CHF -Appears clinically euvolemic currently -12/22/2018 echo EF >65%; mild MR, no WMA -01/03/21 Echo--EF 60-65%, no WMA, severe elevated PASP; mod-severe TR -Resume as needed diuretics as per home regimen. -Continue to follow daily weights and low-sodium diet.   Coronary artery disease -No chest pain -Personally reviewed EKG--- sinus rhythm, no concerning ST-T wave change   Ascending aortic aneurysm -Measuring 5.2 cm on last imaging -Patient follows Dr. Emi Holes to be a poor surgical candidate -Continue outpatient follow-up.   Hyperlipidemia -Continue the use of statin -Continue to follow heart healthy diet.   Hypomagnesemia/Hypophosphatemia/Hypokalemia -Continue to follow electrolytes and further replete as needed. -Goal is for potassium above 4 and magnesium above 2. -Potassium currently 3.4 -Patient has been discharged on daily potassium supplementation.  Procedures: See below for x-ray reports  Consultations: Urology service Cardiology service.  Discharge Exam: Vitals:   01/07/21 1013 01/07/21 1319  BP: (!) 154/82 (!) 141/78  Pulse: 80 82  Resp:  20  Temp:  99.2 F (37.3 C)  SpO2:  97%   General exam: Alert, awake, oriented x 3; no chest pain, no nausea, no vomiting.  Denies dysuria.  Has been able to void after Foley catheter removed. Respiratory system: Good saturation on 1 L nasal cannula supplementation and the patient would like not to receive oxygen at discharge.  No using accessory muscles.  No wheezing or crackles on exam. Cardiovascular  system:Rate controlled; No murmurs, rubs or gallops. Gastrointestinal system: Abdomen is nondistended, soft and nontender. No organomegaly or masses felt. Normal bowel sounds heard. Central nervous system: Alert and oriented. No focal neurological deficits. Extremities: No cyanosis or clubbing. Skin: No petechiae. Psychiatry: Judgement and insight appear normal. Mood & affect appropriate.    Discharge Instructions   Discharge Instructions     (HEART FAILURE PATIENTS) Call MD:  Anytime you have any of the following symptoms: 1) 3 pound weight gain in 24 hours or 5 pounds in 1 week 2) shortness of breath, with or without a dry hacking cough 3) swelling in the hands, feet or stomach 4) if you have to sleep on extra pillows at night in order to breathe.   Complete by: As directed    Diet - low sodium heart healthy   Complete by: As directed    Discharge instructions   Complete by: As directed    Take medications as prescribed Maintain adequate hydration Follow heart healthy/low-sodium diet Arrange follow-up with PCP in 10 days Follow-up with urology and cardiology as instructed.   No wound care   Complete by: As directed       Allergies as of 01/07/2021       Reactions   Sulfa Antibiotics Other (See Comments)   Pt states that this med makes her feel crazy.          Medication List     STOP taking these medications    diltiazem 120 MG 24 hr capsule Commonly known as: CARDIZEM CD   timolol 0.5 % ophthalmic solution Commonly known as: TIMOPTIC       TAKE these medications    amiodarone 200 MG tablet Commonly known as: PACERONE Take 1 tablet (200 mg total) by mouth 2 (two) times daily.   benzonatate  100 MG capsule Commonly known as: TESSALON Take 1-2 capsules (100-200 mg total) by mouth 3 (three) times daily as needed for cough.   BIOTIN PLUS KERATIN PO Take 250 mg by mouth.   Calcium Carbonate-Vitamin D 600-400 MG-UNIT tablet Take 1 tablet by mouth daily.    cefdinir 300 MG capsule Commonly known as: OMNICEF Take 1 capsule (300 mg total) by mouth every 12 (twelve) hours for 5 days.   Cinnamon 500 MG capsule Take 1,000 mg by mouth daily.   CoQ10 100 MG Caps Take 200 mg by mouth daily.   Eliquis 5 MG Tabs tablet Generic drug: apixaban TAKE (1) TABLET BY MOUTH TWICE DAILY.   esomeprazole 40 MG capsule Commonly known as: NEXIUM Take 40 mg by mouth daily.   ferrous sulfate 325 (65 FE) MG tablet Take 325 mg by mouth daily.   furosemide 20 MG tablet Commonly known as: Lasix Take 1 tablet (20 mg total) by mouth daily as needed. What changed: reasons to take this   gabapentin 300 MG capsule Commonly known as: NEURONTIN Take 300 mg by mouth at bedtime.   metoprolol succinate 25 MG 24 hr tablet Commonly known as: TOPROL-XL Take 1 tablet (25 mg total) by mouth in the morning and at bedtime. What changed: when to take this   multivitamin with minerals Tabs tablet Take 1 tablet by mouth daily.   potassium chloride SA 20 MEQ tablet Commonly known as: KLOR-CON Take 1 tablet (20 mEq total) by mouth daily. Start taking on: January 08, 2021   pravastatin 40 MG tablet Commonly known as: PRAVACHOL Take 40 mg by mouth at bedtime.       Allergies  Allergen Reactions   Sulfa Antibiotics Other (See Comments)    Pt states that this med makes her feel crazy.      Follow-up Information     Ellsworth Lennox, PA-C Follow up.   Specialties: Physician Assistant, Cardiology Why: Cardiology Hospital Follow-up on 02/09/2021 at 2:30 PM. Contact information: [address] [place] [address] [phone]         Benita Stabile, MD. Schedule an appointment as soon as possible for a visit in 10 day(s).   Specialty: Internal Medicine Contact information: [address] [name] [facility] [address] [phone]         Jonelle Sidle, MD .   Specialty: Cardiology Contact information: [address] [place]  [address] [phone]         Malen Gauze, MD. Go today.   Specialty: Urology Why: Office will contact you with appointment details. Contact information: [address] [place] [address] [phone]                 The results of significant diagnostics from this hospitalization (including imaging, microbiology, ancillary and laboratory) are listed below for reference.    Significant Diagnostic Studies: DG CHEST PORT 1 VIEW  Result Date: 01/02/2021 CLINICAL DATA:  PICC line placement EXAM: PORTABLE CHEST 1 VIEW COMPARISON:  08/27/2020 FINDINGS: The heart is enlarged. There is atherosclerosis and tortuosity of the aorta. There is interstitial pulmonary edema and bilateral pleural effusions and dependent atelectasis, left more than right. Basilar pneumonia not excluded. Right arm PICC tip extends to the SVC above the right atrium. Monitor lead overlies the distal catheter in the exact location of the tip can not be established. IMPRESSION: Congestive heart failure pattern. Effusions and basilar atelectasis left worse than right. Right arm PICC  tip is at least to the level of the SVC above the right atrium. The exact termination can not be established because of an overlying monitor lead. Electronically Signed   By: Paulina Fusi M.D.   On: 01/02/2021 13:03   DG C-Arm 1-60 Min-No Report  Result Date: 01/02/2021 Fluoroscopy was utilized by the requesting physician.  No radiographic interpretation.   ECHOCARDIOGRAM COMPLETE  Result Date: 01/03/2021    ECHOCARDIOGRAM REPORT   Patient Name:   [name] Date of Exam: 01/03/2021 Medical Rec #:  [id number]       Height:       63.0 in Accession #:    [id number]      Weight:       137.8 lb Date of Birth:  05-19-1928        BSA:          1.650 m Patient Age:    87 years        BP:           148/75 mmHg Patient Gender: F               HR:           79 bpm. [facility] Procedure: 2D Echo, Color Doppler  and Cardiac Doppler Indications:    Atrial Fibrillation  History:        Patient has prior history of Echocardiogram examinations, most                 recent 12/22/2018. CHF, CAD, Arrythmias:Atrial Fibrillation,                 Signs/Symptoms:Dyspnea; Risk Factors:Hypertension and                 Dyslipidemia. Ascending aortic aneurysm.  Sonographer:    Mikki Harbor Referring Phys: [id number] SAMUEL G MCDOWELL IMPRESSIONS  1. Left ventricular ejection fraction, by estimation, is 60 to 65%. The left ventricle has normal function. The left ventricle has no regional wall motion abnormalities. Left ventricular diastolic parameters were normal.  2. Right ventricular systolic function is normal. The right ventricular size is mildly enlarged. There is severely elevated pulmonary artery systolic pressure. The estimated right ventricular systolic pressure is 63.4 mmHg.  3. Left atrial size was mildly dilated.  4. Right atrial size was mildly dilated.  5. The mitral valve is degenerative. Mild to moderate mitral valve regurgitation with very eccentric jet.  6. Tricuspid valve regurgitation is moderate to severe.  7. The aortic valve is tricuspid. There is mild calcification of the aortic valve. Aortic valve regurgitation is mild. Mild to moderate aortic valve sclerosis/calcification is present, without any evidence of aortic stenosis. Aortic valve mean gradient measures 7.0 mmHg.  8. Aortic dilatation noted. There is moderate dilatation of the ascending aorta, measuring 42 mm.  9. The inferior vena cava is dilated in size with <50% respiratory variability, suggesting right atrial pressure of 15 mmHg. Comparison(s): Prior images reviewed side by side. Degree of tricuspid regurgitation and elevated RVSP more prominent in comparison. FINDINGS  Left Ventricle: Left ventricular ejection fraction, by estimation, is 60 to 65%. The left ventricle has normal function. The left ventricle has no regional wall motion abnormalities. The  left ventricular internal cavity size was normal in size. There is  borderline left ventricular hypertrophy. Left ventricular diastolic parameters were normal. Right Ventricle: The right ventricular size is mildly enlarged. No increase in right ventricular wall thickness. Right ventricular systolic function is  normal. There is severely elevated pulmonary artery systolic pressure. The tricuspid regurgitant velocity is 3.48 m/s, and with an assumed right atrial pressure of 15 mmHg, the estimated right ventricular systolic pressure is 63.4 mmHg. Left Atrium: Left atrial size was mildly dilated. Right Atrium: Right atrial size was mildly dilated. Pericardium: There is no evidence of pericardial effusion. Mitral Valve: The mitral valve is degenerative in appearance. There is mild calcification of the mitral valve leaflet(s). Mild mitral annular calcification. Mild to moderate mitral valve regurgitation. No evidence of mitral valve stenosis. MV peak gradient, 5.0 mmHg. The mean mitral valve gradient is 2.0 mmHg. Tricuspid Valve: The tricuspid valve is grossly normal. Tricuspid valve regurgitation is moderate to severe. Aortic Valve: The aortic valve is tricuspid. There is mild calcification of the aortic valve. There is mild aortic valve annular calcification. Aortic valve regurgitation is mild. Aortic regurgitation PHT measures 312 msec. Mild to moderate aortic valve sclerosis/calcification is present, without any evidence of aortic stenosis. Aortic valve mean gradient measures 7.0 mmHg. Aortic valve peak gradient measures 11.6 mmHg. Aortic valve area, by VTI measures 1.78 cm. Pulmonic Valve: The pulmonic valve was grossly normal. Pulmonic valve regurgitation is mild. Aorta: The aortic root is normal in size and structure and aortic dilatation noted. There is moderate dilatation of the ascending aorta, measuring 42 mm. Venous: The inferior vena cava is dilated in size with less than 50% respiratory variability,  suggesting right atrial pressure of 15 mmHg. IAS/Shunts: No atrial level shunt detected by color flow Doppler.  LEFT VENTRICLE PLAX 2D LVIDd:         4.48 cm     Diastology LVIDs:         2.64 cm     LV e' medial:    11.90 cm/s LV PW:         0.90 cm     LV E/e' medial:  7.1 LV IVS:        1.07 cm     LV e' lateral:   13.10 cm/s LVOT diam:     1.90 cm     LV E/e' lateral: 6.5 LV SV:         63 LV SV Index:   38 LVOT Area:     2.84 cm  LV Volumes (MOD) LV vol d, MOD A2C: 48.9 ml LV vol d, MOD A4C: 47.2 ml LV vol s, MOD A2C: 19.9 ml LV vol s, MOD A4C: 19.4 ml LV SV MOD A2C:     29.0 ml LV SV MOD A4C:     47.2 ml LV SV MOD BP:      28.3 ml RIGHT VENTRICLE RV Basal diam:  4.14 cm RV Mid diam:    3.78 cm RV S prime:     16.30 cm/s TAPSE (M-mode): 2.0 cm LEFT ATRIUM             Index       RIGHT ATRIUM           Index LA diam:        3.80 cm 2.30 cm/m  RA Area:     18.90 cm LA Vol (A2C):   56.9 ml 34.48 ml/m RA Volume:   56.40 ml  34.17 ml/m LA Vol (A4C):   60.8 ml 36.84 ml/m LA Biplane Vol: 60.3 ml 36.54 ml/m  AORTIC VALVE AV Area (Vmax):    1.77 cm AV Area (Vmean):   1.62 cm AV Area (VTI):     1.78 cm AV Vmax:  170.00 cm/s AV Vmean:          125.000 cm/s AV VTI:            0.352 m AV Peak Grad:      11.6 mmHg AV Mean Grad:      7.0 mmHg LVOT Vmax:         106.00 cm/s LVOT Vmean:        71.300 cm/s LVOT VTI:          0.221 m LVOT/AV VTI ratio: 0.63 AI PHT:            312 msec  AORTA Ao Root diam: 3.20 cm Ao Asc diam:  4.20 cm MITRAL VALVE               TRICUSPID VALVE MV Area (PHT): 3.63 cm    TR Peak grad:   48.4 mmHg MV Area VTI:   2.41 cm    TR Vmax:        348.00 cm/s MV Peak grad:  5.0 mmHg MV Mean grad:  2.0 mmHg    SHUNTS MV Vmax:       1.12 m/s    Systemic VTI:  0.22 m MV Vmean:      63.9 cm/s   Systemic Diam: 1.90 cm MV Decel Time: 209 msec MV E velocity: 85.00 cm/s MV A velocity: 98.70 cm/s MV E/A ratio:  0.86 Nona Dell MD Electronically signed by Nona Dell MD Signature  Date/Time: 01/03/2021/11:38:28 AM    Final    CT RENAL STONE STUDY  Result Date: 01/02/2021 CLINICAL DATA:  Flank pain.  Concern for kidney stone. EXAM: CT ABDOMEN AND PELVIS WITHOUT CONTRAST TECHNIQUE: Multidetector CT imaging of the abdomen and pelvis was performed following the standard protocol without IV contrast. COMPARISON:  CT of the abdomen pelvis dated 08/19/2016. FINDINGS: Evaluation of this exam is limited in the absence of intravenous contrast. Lower chest: Minimal bibasilar atelectasis. There is coronary vascular calcification. No intra-abdominal free air or free fluid. Hepatobiliary: The liver is unremarkable. No intrahepatic biliary ductal dilatation. The gallbladder is unremarkable. Pancreas: The pancreas is unremarkable. Spleen: Normal in size without focal abnormality. Adrenals/Urinary Tract: The adrenal glands unremarkable. There is a 7 mm stone in the distal right ureter with mild right hydronephrosis. There is mild right perinephric stranding. Correlation with urinalysis recommended to exclude UTI. There is a 3 cm left renal cyst. No hydronephrosis or nephrolithiasis on the left the left ureter and urinary bladder appear unremarkable. Stomach/Bowel: Moderate size hiatal hernia. There is sigmoid diverticulosis without active inflammatory changes. There is no bowel obstruction or active inflammation. The appendix is normal. Vascular/Lymphatic: Advanced aortoiliac atherosclerotic disease. The IVC is grossly unremarkable. No portal venous gas. There is no adenopathy. Reproductive: The uterus is anteverted and grossly unremarkable. No adnexal masses. Other: Induration of the subcutaneous soft tissues of the right gluteal region. No fluid collection. Musculoskeletal: Osteopenia with degenerative changes of the spine. Grade 1 L4-L5 anterolisthesis. No acute osseous pathology. IMPRESSION: 1. A 7 mm distal right ureteral stone with mild right hydronephrosis. Correlation with urinalysis recommended to  exclude UTI. 2. Sigmoid diverticulosis. No bowel obstruction. Normal appendix. 3. Aortic Atherosclerosis (ICD10-I70.0). Electronically Signed   By: Elgie Collard M.D.   On: 01/02/2021 01:34   [place] EKG SITE RITE  Result Date: 01/02/2021 If Site Rite image not attached, placement could not be confirmed due to current cardiac rhythm.   Microbiology: Recent Results (from the past 240 hour(s))  Urine Culture  Status: Abnormal   Collection Time: 01/01/21 11:00 PM   Specimen: Urine, Clean Catch  Result Value Ref Range Status   Specimen Description   Final    URINE, CLEAN CATCH Performed at [facility], [address], [place], [place] [address]    Special Requests   Final    NONE Performed at [facility], [address] [place], [place] [address]    Culture >=100,000 COLONIES/mL ESCHERICHIA COLI (A)  Final   Report Status 01/04/2021 FINAL  Final   Organism ID, Bacteria ESCHERICHIA COLI (A)  Final      Susceptibility   Escherichia coli - MIC*    AMPICILLIN <=2 SENSITIVE Sensitive     CEFAZOLIN <=4 SENSITIVE Sensitive     CEFEPIME <=0.12 SENSITIVE Sensitive     CEFTRIAXONE <=0.25 SENSITIVE Sensitive     CIPROFLOXACIN <=0.25 SENSITIVE Sensitive     GENTAMICIN <=1 SENSITIVE Sensitive     IMIPENEM <=0.25 SENSITIVE Sensitive     NITROFURANTOIN <=16 SENSITIVE Sensitive     TRIMETH/SULFA <=20 SENSITIVE Sensitive     AMPICILLIN/SULBACTAM <=2 SENSITIVE Sensitive     PIP/TAZO <=4 SENSITIVE Sensitive     * >=100,000 COLONIES/mL ESCHERICHIA COLI  Resp Panel by RT-PCR (Flu A&B, Covid) Nasopharyngeal Swab     Status: None   Collection Time: 01/02/21  4:17 AM   Specimen: Nasopharyngeal Swab; Nasopharyngeal(NP) swabs in vial transport medium  Result Value Ref Range Status   SARS Coronavirus 2 by RT PCR NEGATIVE NEGATIVE Final    Comment: (NOTE) SARS-CoV-2 target nucleic acids are NOT DETECTED.  The SARS-CoV-2 RNA is generally detectable in upper respiratory specimens during the acute phase  of infection. The lowest concentration of SARS-CoV-2 viral copies this assay can detect is 138 copies/mL. A negative result does not preclude SARS-Cov-2 infection and should not be used as the sole basis for treatment or other patient management decisions. A negative result may occur with  improper specimen collection/handling, submission of specimen other than nasopharyngeal swab, presence of viral mutation(s) within the areas targeted by this assay, and inadequate number of viral copies(<138 copies/mL). A negative result must be combined with clinical observations, patient history, and epidemiological information. The expected result is Negative.  Fact Sheet for Patients:  BloggerCourse.com  Fact Sheet for Healthcare Providers:  SeriousBroker.it  This test is no t yet approved or cleared by the [place] FDA and  has been authorized for detection and/or diagnosis of SARS-CoV-2 by FDA under an Emergency Use Authorization (EUA). This EUA will remain  in effect (meaning this test can be used) for the duration of the COVID-19 declaration under Section 564(b)(1) of the Act, 21 U.S.C.section 360bbb-3(b)(1), unless the authorization is terminated  or revoked sooner.       Influenza A by PCR NEGATIVE NEGATIVE Final   Influenza B by PCR NEGATIVE NEGATIVE Final    Comment: (NOTE) The Xpert Xpress SARS-CoV-2/FLU/RSV plus assay is intended as an aid in the diagnosis of influenza from Nasopharyngeal swab specimens and should not be used as a sole basis for treatment. Nasal washings and aspirates are unacceptable for Xpert Xpress SARS-CoV-2/FLU/RSV testing.  Fact Sheet for Patients: BloggerCourse.com  Fact Sheet for Healthcare Providers: SeriousBroker.it  This test is not yet approved or cleared by the [place] FDA and has been authorized for detection and/or diagnosis of  SARS-CoV-2 by FDA under an Emergency Use Authorization (EUA). This EUA will remain in effect (meaning this test can be used) for the duration of the COVID-19 declaration  under Section 564(b)(1) of the Act, 21 U.S.C. section 360bbb-3(b)(1), unless the authorization is terminated or revoked.  Performed at [facility], [address], [place], [place] [address]   Culture, blood (single)     Status: None   Collection Time: 01/02/21  4:46 AM   Specimen: Left Antecubital; Blood  Result Value Ref Range Status   Specimen Description LEFT ANTECUBITAL  Final   Special Requests   Final    BOTTLES DRAWN AEROBIC AND ANAEROBIC Blood Culture adequate volume   Culture   Final    NO GROWTH 5 DAYS Performed at [facility], [address], [place], [place] [address]    Report Status 01/07/2021 FINAL  Final  MRSA Next Gen by PCR, Nasal     Status: None   Collection Time: 01/02/21  3:46 PM   Specimen: Nasal Mucosa; Nasal Swab  Result Value Ref Range Status   MRSA by PCR Next Gen NOT DETECTED NOT DETECTED Final    Comment: (NOTE) The GeneXpert MRSA Assay (FDA approved for NASAL specimens only), is one component of a comprehensive MRSA colonization surveillance program. It is not intended to diagnose MRSA infection nor to guide or monitor treatment for MRSA infections. Test performance is not FDA approved in patients less than 43 years old. Performed at [facility], [address], [place], [place] [address]   C Difficile Quick Screen w PCR reflex     Status: None   Collection Time: 01/03/21 12:55 PM   Specimen: STOOL  Result Value Ref Range Status   C Diff antigen NEGATIVE NEGATIVE Final   C Diff toxin NEGATIVE NEGATIVE Final   C Diff interpretation No C. difficile detected.  Final    Comment: Performed at [facility], [address], [place], [place] [address]     Labs: Basic Metabolic Panel: Recent Labs  Lab 01/02/21 0848 01/03/21 0531 01/04/21 0442 01/04/21 1401 01/05/21 0442  01/05/21 1349 01/06/21 0633 01/07/21 0530  NA  --  138 141  --  141 142  --  142  K  --  3.2* 2.7* 2.9* 3.1* 3.2*  --  3.4*  CL  --  101 99  --  102 104  --  104  CO2  --  27 33*  --  32 31  --  25  GLUCOSE  --  105* 115*  --  113* 143*  --  138*  BUN  --  18 16  --  15 12  --  8  CREATININE  --  0.83 0.71  --  0.59 0.70 0.53 0.57  CALCIUM  --  7.8* 8.2*  --  8.1* 8.2*  --  8.6*  MG 1.4*  --  1.8  --   --   --  1.8  --   PHOS 2.2*  --  2.4*  --   --   --   --   --    Liver Function Tests: Recent Labs  Lab 01/03/21 0531  AST 21  ALT 20  ALKPHOS 49  BILITOT 0.9  PROT 5.5*  ALBUMIN 2.9*   CBC: Recent Labs  Lab 01/02/21 0057 01/03/21 0531 01/04/21 0442 01/05/21 0442  WBC 9.2 19.6* 15.8* 9.0  NEUTROABS 8.5*  --   --   --   HGB 16.4* 12.8 13.5 12.5  HCT 49.4* 40.4 41.5 38.6  MCV 93.4 96.4 95.2 95.3  PLT 143* 76* 75* 83*    Signed:  Vassie Loll MD.  [facility] 01/07/2021, 3:55  PM

## 2021-01-07 NOTE — Progress Notes (Signed)
 bolus complete and AM dose of Amiodarone given at 0445.  HR remained in Afib in the 130-140's.  Dr. Tula Nakayama DO notified and order for Cardizem 10mg  IV obtained.  Pt's HR then noted to be mid 80's and NSR on tele at 0611.  Dr. 0612 DO notified of rhythm change.  Will hold off on Cardizem for now.   Tula Nakayama BSN RN CMSRN

## 2021-01-07 NOTE — Progress Notes (Signed)
Nsg Discharge Note  Admit Date:  01/01/2021 Discharge date: 01/07/2021   [name] to be D/C'd Home per MD order.  AVS completed.  Copy for chart, and copy for patient signed, and dated. Patient/caregiver able to verbalize understanding.  Discharge Medication: Allergies as of 01/07/2021       Reactions   Sulfa Antibiotics Other (See Comments)   Pt states that this med makes her feel crazy.          Medication List     STOP taking these medications    diltiazem 120 MG 24 hr capsule Commonly known as: CARDIZEM CD   timolol 0.5 % ophthalmic solution Commonly known as: TIMOPTIC       TAKE these medications    amiodarone 200 MG tablet Commonly known as: PACERONE Take 1 tablet (200 mg total) by mouth 2 (two) times daily.   benzonatate 100 MG capsule Commonly known as: TESSALON Take 1-2 capsules (100-200 mg total) by mouth 3 (three) times daily as needed for cough.   BIOTIN PLUS KERATIN PO Take 250 mg by mouth.   Calcium Carbonate-Vitamin D 600-400 MG-UNIT tablet Take 1 tablet by mouth daily.   cefdinir 300 MG capsule Commonly known as: OMNICEF Take 1 capsule (300 mg total) by mouth every 12 (twelve) hours for 5 days.   Cinnamon 500 MG capsule Take 1,000 mg by mouth daily.   CoQ10 100 MG Caps Take 200 mg by mouth daily.   Eliquis 5 MG Tabs tablet Generic drug: apixaban TAKE (1) TABLET BY MOUTH TWICE DAILY.   esomeprazole 40 MG capsule Commonly known as: NEXIUM Take 40 mg by mouth daily.   ferrous sulfate 325 (65 FE) MG tablet Take 325 mg by mouth daily.   furosemide 20 MG tablet Commonly known as: Lasix Take 1 tablet (20 mg total) by mouth daily as needed. What changed: reasons to take this   gabapentin 300 MG capsule Commonly known as: NEURONTIN Take 300 mg by mouth at bedtime.   metoprolol succinate 25 MG 24 hr tablet Commonly known as: TOPROL-XL Take 1 tablet (25 mg total) by mouth in the morning and at bedtime. What changed: when to  take this   multivitamin with minerals Tabs tablet Take 1 tablet by mouth daily.   potassium chloride SA 20 MEQ tablet Commonly known as: KLOR-CON Take 1 tablet (20 mEq total) by mouth daily. Start taking on: January 08, 2021   pravastatin 40 MG tablet Commonly known as: PRAVACHOL Take 40 mg by mouth at bedtime.        Discharge Assessment: Vitals:   01/07/21 1013 01/07/21 1319  BP: (!) 154/82 (!) 141/78  Pulse: 80 82  Resp:  20  Temp:  99.2 F (37.3 C)  SpO2:  97%   Skin clean, dry and intact without evidence of skin break down, no evidence of skin tears noted. IV catheter discontinued intact. Site without signs and symptoms of complications - no redness or edema noted at insertion site, patient denies c/o pain - only slight tenderness at site.  Dressing with slight pressure applied.  D/c Instructions-Education: Discharge instructions given to patient/family with verbalized understanding. D/c education completed with patient/family including follow up instructions, medication list, d/c activities limitations if indicated, with other d/c instructions as indicated by MD - patient able to verbalize understanding, all questions fully answered. Patient instructed to return to ED, call 911, or call MD for any changes in condition.  Patient escorted via WC, and D/C home via private  Judie Petit, LPN 2/35/5732 2:02 PM

## 2021-01-07 NOTE — Progress Notes (Signed)
HR noted to be elevated up to 150's on tele.  EKG done and confirmed Afib with RVR.  Pt is asymptomatic and denies any CP or SHOB.  Discussed with Dr. Tula Nakayama DO.  Gave 5mg  IV Metoprolol with minimal effect noted.  Dr. DO notified again and orders received for Tula Nakayama NS bolus and to give AM dose of Amiodarone early.    BSN RN CMSRN    01/07/21 0327 01/07/21 0402 01/07/21 0412  Assess: MEWS Score  Temp 98.6 F (37 C)  --   --   BP 134/89 107/84 124/82  Pulse Rate (!) 126 (!) 128 (!) 120  Resp 18 18 18   Level of Consciousness  --   --  Alert  SpO2 92 % 92 % 93 %  O2 Device Room Air Room Air Room Air  Assess: MEWS Score  MEWS Temp 0 0 0  MEWS Systolic 0 0 0  MEWS Pulse 2 2 2   MEWS RR 0 0 0  MEWS LOC 0 0 0  MEWS Score 2 2 2   MEWS Score Color Yellow Yellow Yellow  Assess: if the MEWS score is Yellow or Red  Were vital signs taken at a resting state? Yes  --   --   Focused Assessment Change from prior assessment (see assessment flowsheet)  --   --   Early Detection of Sepsis Score *See Row Information* Low  --   --   MEWS guidelines implemented *See Row Information* Yes  --   --   Treat  Pain Scale 0-10 0-10 0-10  Pain Score 0 0 0  Take Vital Signs  Increase Vital Sign Frequency  Yellow: Q 2hr X 2 then Q 4hr X 2, if remains yellow, continue Q 4hrs  --   --   Escalate  MEWS: Escalate Yellow: discuss with charge nurse/RN and consider discussing with provider and RRT  --   --   Notify: Charge Nurse/RN  Name of Charge Nurse/RN Notified 01/09/21 RN  --   --   Date Charge Nurse/RN Notified 01/07/21  --   --   Time Charge Nurse/RN Notified 0345  --   --   Notify: Provider  Provider Name/Title Dr. DO  --   --   Date Provider Notified 01/07/21  --   --   Time Provider Notified [phone]  --   --   Notification Type Page  --   --   Notification Reason Change in status (Afib)  --   --   Provider response See new orders  --   --   Date of Provider  Response 01/07/21  --   --   Time of Provider Response 0350  --   --   Document  Patient Outcome Stabilized after interventions  --   --   Progress note created (see row info) Yes  --   --

## 2021-01-10 DIAGNOSIS — N390 Urinary tract infection, site not specified: Secondary | ICD-10-CM | POA: Diagnosis not present

## 2021-01-10 DIAGNOSIS — R531 Weakness: Secondary | ICD-10-CM | POA: Diagnosis not present

## 2021-01-10 DIAGNOSIS — R197 Diarrhea, unspecified: Secondary | ICD-10-CM | POA: Diagnosis not present

## 2021-01-11 DIAGNOSIS — R531 Weakness: Secondary | ICD-10-CM | POA: Diagnosis not present

## 2021-01-11 DIAGNOSIS — N390 Urinary tract infection, site not specified: Secondary | ICD-10-CM | POA: Diagnosis not present

## 2021-01-11 DIAGNOSIS — N209 Urinary calculus, unspecified: Secondary | ICD-10-CM | POA: Diagnosis not present

## 2021-01-11 DIAGNOSIS — R197 Diarrhea, unspecified: Secondary | ICD-10-CM | POA: Diagnosis not present

## 2021-01-11 DIAGNOSIS — M6281 Muscle weakness (generalized): Secondary | ICD-10-CM | POA: Diagnosis not present

## 2021-01-11 NOTE — Pre-Procedure Instructions (Signed)
Note sent to Alfredo Martinez, RN at Dr McKenzie's office to see if patient needs to hold eliquis prior to procedure.

## 2021-01-12 DIAGNOSIS — I5020 Unspecified systolic (congestive) heart failure: Secondary | ICD-10-CM | POA: Diagnosis not present

## 2021-01-12 DIAGNOSIS — N390 Urinary tract infection, site not specified: Secondary | ICD-10-CM | POA: Diagnosis not present

## 2021-01-12 DIAGNOSIS — R279 Unspecified lack of coordination: Secondary | ICD-10-CM | POA: Diagnosis not present

## 2021-01-12 NOTE — Pre-Procedure Instructions (Signed)
FW: eliquis Received: Today Ferdinand Lango, RN  Elsie Amis, RN Denver Faster,   Here is Dr. Ronne Binning message.        Previous Messages   ----- Message -----  From: Malen Gauze, MD  Sent: 01/12/2021  12:57 PM EDT  To: Ferdinand Lango, RN  Subject: RE: eliquis                                     Patient can stay on eliquis  ----- Message -----  From: Ferdinand Lango, RN  Sent: 01/12/2021   8:22 AM EDT  To: Malen Gauze, MD, Elsie Amis, RN  Subject: RE: eliquis                                     Please advise on Eliquis. Patient was the inpatient you saw.  ----- Message -----  From: Elsie Amis, RN  Sent: 01/11/2021  11:13 AM EDT  To: Ferdinand Lango, RN  Subject: eliquis                                         Good morning Marchelle Folks! I saw a note in special needs that Jetty Peeks could stay on aspirin, but what about her eliquis?

## 2021-01-13 DIAGNOSIS — I110 Hypertensive heart disease with heart failure: Secondary | ICD-10-CM | POA: Diagnosis not present

## 2021-01-13 DIAGNOSIS — R531 Weakness: Secondary | ICD-10-CM | POA: Diagnosis not present

## 2021-01-13 DIAGNOSIS — R197 Diarrhea, unspecified: Secondary | ICD-10-CM | POA: Diagnosis not present

## 2021-01-13 DIAGNOSIS — I4891 Unspecified atrial fibrillation: Secondary | ICD-10-CM | POA: Diagnosis not present

## 2021-01-13 DIAGNOSIS — N390 Urinary tract infection, site not specified: Secondary | ICD-10-CM | POA: Diagnosis not present

## 2021-01-13 DIAGNOSIS — N200 Calculus of kidney: Secondary | ICD-10-CM | POA: Diagnosis not present

## 2021-01-13 DIAGNOSIS — Z9181 History of falling: Secondary | ICD-10-CM | POA: Diagnosis not present

## 2021-01-13 DIAGNOSIS — I5020 Unspecified systolic (congestive) heart failure: Secondary | ICD-10-CM | POA: Diagnosis not present

## 2021-01-13 DIAGNOSIS — Z7901 Long term (current) use of anticoagulants: Secondary | ICD-10-CM | POA: Diagnosis not present

## 2021-01-13 DIAGNOSIS — E785 Hyperlipidemia, unspecified: Secondary | ICD-10-CM | POA: Diagnosis not present

## 2021-01-13 DIAGNOSIS — K219 Gastro-esophageal reflux disease without esophagitis: Secondary | ICD-10-CM | POA: Diagnosis not present

## 2021-01-13 DIAGNOSIS — A4151 Sepsis due to Escherichia coli [E. coli]: Secondary | ICD-10-CM | POA: Diagnosis not present

## 2021-01-16 ENCOUNTER — Other Ambulatory Visit: Payer: Self-pay

## 2021-01-16 ENCOUNTER — Encounter (HOSPITAL_COMMUNITY)
Admit: 2021-01-16 | Discharge: 2021-01-16 | Disposition: A | Discharge status: Home / Self Care | Payer: PPO | Attending: Urology | Admitting: Urology

## 2021-01-16 ENCOUNTER — Encounter (HOSPITAL_COMMUNITY): Payer: Self-pay

## 2021-01-16 DIAGNOSIS — R600 Localized edema: Secondary | ICD-10-CM | POA: Diagnosis not present

## 2021-01-19 ENCOUNTER — Ambulatory Visit (HOSPITAL_COMMUNITY): Payer: PPO | Admitting: Anesthesiology

## 2021-01-19 ENCOUNTER — Ambulatory Visit (HOSPITAL_COMMUNITY): Payer: PPO

## 2021-01-19 ENCOUNTER — Encounter (HOSPITAL_COMMUNITY): Payer: Self-pay | Admitting: Urology

## 2021-01-19 ENCOUNTER — Ambulatory Visit (HOSPITAL_COMMUNITY)
Admission: RE | Admit: 2021-01-19 | Discharge: 2021-01-19 | Disposition: A | Discharge status: Home / Self Care | Payer: PPO | Attending: Urology | Admitting: Urology

## 2021-01-19 ENCOUNTER — Encounter (HOSPITAL_COMMUNITY)
Admission: RE | Disposition: A | Discharge status: Home / Self Care | Payer: Self-pay | Source: Home / Self Care | Attending: Urology

## 2021-01-19 DIAGNOSIS — Z882 Allergy status to sulfonamides status: Secondary | ICD-10-CM | POA: Diagnosis not present

## 2021-01-19 DIAGNOSIS — I719 Aortic aneurysm of unspecified site, without rupture: Secondary | ICD-10-CM | POA: Diagnosis not present

## 2021-01-19 DIAGNOSIS — N201 Calculus of ureter: Secondary | ICD-10-CM | POA: Insufficient documentation

## 2021-01-19 DIAGNOSIS — Z79899 Other long term (current) drug therapy: Secondary | ICD-10-CM | POA: Diagnosis not present

## 2021-01-19 DIAGNOSIS — Z87442 Personal history of urinary calculi: Secondary | ICD-10-CM | POA: Diagnosis not present

## 2021-01-19 HISTORY — PX: HOLMIUM LASER APPLICATION: SHX5852

## 2021-01-19 HISTORY — PX: CYSTOSCOPY WITH RETROGRADE PYELOGRAM, URETEROSCOPY AND STENT PLACEMENT: SHX5789

## 2021-01-19 SURGERY — CYSTOURETEROSCOPY, WITH RETROGRADE PYELOGRAM AND STENT INSERTION
Anesthesia: General | Site: Ureter | Laterality: Right

## 2021-01-19 MED ORDER — WATER FOR IRRIGATION, STERILE IR SOLN
Status: DC | PRN
  Administered 2021-01-19: 1000 mL

## 2021-01-19 MED ORDER — TRAMADOL HCL 50 MG PO TABS
50.0000 mg | ORAL_TABLET | Freq: Four times a day (QID) | ORAL | 0 refills | Status: DC | PRN
Start: 2021-01-19 — End: 2021-02-21

## 2021-01-19 MED ORDER — ONDANSETRON HCL 4 MG/2ML IJ SOLN: 4.0000 mg | Freq: Once | INTRAMUSCULAR | Status: DC | PRN

## 2021-01-19 MED ORDER — EPHEDRINE SULFATE 50 MG/ML IJ SOLN
INTRAMUSCULAR | Status: DC | PRN
  Administered 2021-01-19 (×2): 10 mg via INTRAVENOUS

## 2021-01-19 MED ORDER — CHLORHEXIDINE GLUCONATE 0.12 % MT SOLN
15.0000 mL | Freq: Once | OROMUCOSAL | Status: AC
  Administered 2021-01-19: 15 mL via OROMUCOSAL
  Filled 2021-01-19: qty 15

## 2021-01-19 MED ORDER — ORAL CARE MOUTH RINSE: 15.0000 mL | Freq: Once | OROMUCOSAL | Status: AC

## 2021-01-19 MED ORDER — ETOMIDATE 2 MG/ML IV SOLN
INTRAVENOUS | Status: AC
  Filled 2021-01-19: qty 10

## 2021-01-19 MED ORDER — EPHEDRINE 5 MG/ML INJ
INTRAVENOUS | Status: AC
  Filled 2021-01-19: qty 5

## 2021-01-19 MED ORDER — DIATRIZOATE MEGLUMINE 30 % UR SOLN
URETHRAL | Status: AC
  Filled 2021-01-19: qty 100

## 2021-01-19 MED ORDER — LACTATED RINGERS IV SOLN
INTRAVENOUS | Status: DC
  Administered 2021-01-19: 1000 mL via INTRAVENOUS

## 2021-01-19 MED ORDER — CEFAZOLIN SODIUM-DEXTROSE 2-4 GM/100ML-% IV SOLN
2.0000 g | INTRAVENOUS | Status: AC
  Administered 2021-01-19: 2 g via INTRAVENOUS
  Filled 2021-01-19: qty 100

## 2021-01-19 MED ORDER — ETOMIDATE 2 MG/ML IV SOLN
INTRAVENOUS | Status: DC | PRN
  Administered 2021-01-19: 8 mg via INTRAVENOUS

## 2021-01-19 MED ORDER — PROPOFOL 10 MG/ML IV BOLUS
INTRAVENOUS | Status: DC | PRN
Start: 2021-01-19 — End: 2021-01-19
  Administered 2021-01-19: 20 mg via INTRAVENOUS
  Administered 2021-01-19: 40 mg via INTRAVENOUS

## 2021-01-19 MED ORDER — ONDANSETRON HCL 4 MG/2ML IJ SOLN
INTRAMUSCULAR | Status: AC
  Filled 2021-01-19: qty 2

## 2021-01-19 MED ORDER — SODIUM CHLORIDE 0.9 % IR SOLN
Status: DC | PRN
  Administered 2021-01-19: 3000 mL

## 2021-01-19 MED ORDER — FENTANYL CITRATE (PF) 250 MCG/5ML IJ SOLN
INTRAMUSCULAR | Status: DC | PRN
  Administered 2021-01-19: 25 ug via INTRAVENOUS

## 2021-01-19 MED ORDER — DIATRIZOATE MEGLUMINE 30 % UR SOLN
URETHRAL | Status: DC | PRN
  Administered 2021-01-19: 10 mL via URETHRAL

## 2021-01-19 MED ORDER — ONDANSETRON HCL 4 MG/2ML IJ SOLN
INTRAMUSCULAR | Status: DC | PRN
  Administered 2021-01-19: 4 mg via INTRAVENOUS

## 2021-01-19 MED ORDER — LIDOCAINE HCL (CARDIAC) PF 100 MG/5ML IV SOSY
PREFILLED_SYRINGE | INTRAVENOUS | Status: DC | PRN
  Administered 2021-01-19: 40 mg via INTRAVENOUS

## 2021-01-19 MED ORDER — LIDOCAINE HCL (PF) 2 % IJ SOLN
INTRAMUSCULAR | Status: AC
  Filled 2021-01-19: qty 5

## 2021-01-19 MED ORDER — PHENYLEPHRINE 40 MCG/ML (10ML) SYRINGE FOR IV PUSH (FOR BLOOD PRESSURE SUPPORT)
PREFILLED_SYRINGE | INTRAVENOUS | Status: AC
  Filled 2021-01-19: qty 10

## 2021-01-19 MED ORDER — PHENYLEPHRINE HCL (PRESSORS) 10 MG/ML IV SOLN
INTRAVENOUS | Status: DC | PRN
  Administered 2021-01-19: 80 ug via INTRAVENOUS
  Administered 2021-01-19: 40 ug via INTRAVENOUS
  Administered 2021-01-19: 80 ug via INTRAVENOUS

## 2021-01-19 MED ORDER — FENTANYL CITRATE PF 50 MCG/ML IJ SOSY: 25.0000 ug | PREFILLED_SYRINGE | INTRAMUSCULAR | Status: DC | PRN

## 2021-01-19 MED ORDER — FENTANYL CITRATE (PF) 100 MCG/2ML IJ SOLN
INTRAMUSCULAR | Status: AC
  Filled 2021-01-19: qty 2

## 2021-01-19 SURGICAL SUPPLY — 27 items
BAG DRAIN URO TABLE W/ADPT NS (BAG) ×2 IMPLANT
BAG DRN 8 ADPR NS SKTRN CSTL (BAG) ×1
BAG HAMPER (MISCELLANEOUS) ×2 IMPLANT
CATH INTERMIT  6FR 70CM (CATHETERS) ×2 IMPLANT
CLOTH BEACON ORANGE TIMEOUT ST (SAFETY) ×2 IMPLANT
DECANTER SPIKE VIAL GLASS SM (MISCELLANEOUS) ×2 IMPLANT
EXTRACTOR STONE NITINOL NGAGE (UROLOGICAL SUPPLIES) ×1 IMPLANT
GLOVE SURG POLYISO LF SZ8 (GLOVE) ×2 IMPLANT
GLOVE SURG UNDER POLY LF SZ7 (GLOVE) ×4 IMPLANT
GOWN STRL REUS W/TWL LRG LVL3 (GOWN DISPOSABLE) ×2 IMPLANT
GOWN STRL REUS W/TWL XL LVL3 (GOWN DISPOSABLE) ×2 IMPLANT
GUIDEWIRE STR DUAL SENSOR (WIRE) ×2 IMPLANT
GUIDEWIRE STR ZIPWIRE 035X150 (MISCELLANEOUS) ×2 IMPLANT
IV NS IRRIG 3000ML ARTHROMATIC (IV SOLUTION) ×4 IMPLANT
KIT TURNOVER CYSTO (KITS) ×2 IMPLANT
MANIFOLD NEPTUNE II (INSTRUMENTS) ×2 IMPLANT
PACK CYSTO (CUSTOM PROCEDURE TRAY) ×2 IMPLANT
PAD ARMBOARD 7.5X6 YLW CONV (MISCELLANEOUS) ×2 IMPLANT
SHEATH URETERAL 12FRX35CM (MISCELLANEOUS) IMPLANT
STENT URET 6FRX24 CONTOUR (STENTS) ×1 IMPLANT
STENT URET 6FRX26 CONTOUR (STENTS) IMPLANT
SYR 10ML LL (SYRINGE) ×2 IMPLANT
SYR CONTROL 10ML LL (SYRINGE) ×2 IMPLANT
TOWEL OR 17X26 4PK STRL BLUE (TOWEL DISPOSABLE) ×2 IMPLANT
TRACTIP FLEXIVA PULS ID 200XHI (Laser) IMPLANT
TRACTIP FLEXIVA PULSE ID 200 (Laser) ×2
WATER STERILE IRR 500ML POUR (IV SOLUTION) ×2 IMPLANT

## 2021-01-19 NOTE — Anesthesia Postprocedure Evaluation (Signed)
Anesthesia Post Note  Patient: [name]  Procedure(s) Performed: CYSTOSCOPY WITH RETROGRADE PYELOGRAM, URETEROSCOPY AND STENT EXCHANGE (Right: Ureter) HOLMIUM LASER APPLICATION (Right: Ureter)  Patient location during evaluation: PACU Anesthesia Type: General Level of consciousness: awake and alert and oriented Pain management: pain level controlled Vital Signs Assessment: post-procedure vital signs reviewed and stable Respiratory status: spontaneous breathing and respiratory function stable Cardiovascular status: blood pressure returned to baseline and stable Postop Assessment: no apparent nausea or vomiting Anesthetic complications: no   No notable events documented.   Last Vitals:  Vitals:   01/19/21 1100 01/19/21 1127  BP: 104/60 (!) 112/94  Pulse: 69 70  Resp: 20 18  Temp:  36.6 C  SpO2: 97% 96%    Last Pain:  Vitals:   01/19/21 1127  TempSrc: Axillary  PainSc: 0-No pain                 [name]

## 2021-01-19 NOTE — Anesthesia Preprocedure Evaluation (Signed)
Anesthesia Evaluation  Patient identified by MRN, date of birth, ID band Patient awake    Reviewed: Allergy & Precautions, NPO status , Patient's Chart, lab work & pertinent test results, reviewed documented beta blocker date and time   History of Anesthesia Complications Negative for: history of anesthetic complications  Airway Mallampati: III  TM Distance: >3 FB Neck ROM: Full    Dental  (+) Dental Advisory Given, Missing   Pulmonary shortness of breath and with exertion,    Pulmonary exam normal breath sounds clear to auscultation       Cardiovascular hypertension, Pt. on medications and Pt. on home beta blockers + CAD, + Peripheral Vascular Disease (AAA) and +CHF  Normal cardiovascular exam+ dysrhythmias Atrial Fibrillation  Rhythm:Regular Rate:Normal  1. Left ventricular ejection fraction, by estimation, is 60 to 65%. The left ventricle has normal function. The left ventricle has no regional wall motion abnormalities. Left ventricular diastolic parameters were normal.  2. Right ventricular systolic function is normal. The right ventricular size is mildly enlarged. There is severely elevated pulmonary artery systolic pressure. The estimated right ventricular systolic pressure is  63.4 mmHg.  3. Left atrial size was mildly dilated.  4. Right atrial size was mildly dilated.  5. The mitral valve is degenerative. Mild to moderate mitral valve regurgitation with very eccentric jet.  6. Tricuspid valve regurgitation is moderate to severe.  7. The aortic valve is tricuspid. There is mild calcification of the aortic valve. Aortic valve regurgitation is mild. Mild to moderate aortic valve sclerosis/calcification is present, without any evidence of aortic  stenosis. Aortic valve mean gradient measures 7.0 mmHg.  8. Aortic dilatation noted. There is moderate dilatation of the ascending aorta, measuring 42 mm.  9. The inferior vena  cava is dilated in size with <50% respiratory variability, suggesting right atrial pressure of 15 mmHg.    Neuro/Psych  Neuromuscular disease negative psych ROS   GI/Hepatic Neg liver ROS, GERD  Medicated,  Endo/Other  negative endocrine ROS  Renal/GU Renal InsufficiencyRenal disease     Musculoskeletal negative musculoskeletal ROS (+)   Abdominal   Peds  Hematology  (+) Blood dyscrasia (thrombocytopenia), anemia ,   Anesthesia Other Findings IMPRESSION: Stable 5.2 cm aneurysm of the ascending thoracic aorta.  Ascending thoracic aortic aneurysm. Recommend semi-annual imaging followup by CTA or MRA and referral to cardiothoracic surgery if not already obtained. This recommendation follows 2010 ACCF/AHA/AATS/ACR/ASA/SCA/SCAI/SIR/STS/SVM Guidelines for the Diagnosis and Management of Patients With Thoracic Aortic Disease. Circulation. 2010; 121: O973-Z329. Aortic aneurysm NOS (ICD10-I71.9)  No acute pulmonary embolus.  No other acute intrathoracic vascular finding.  Stable large hiatal hernia  Aortic aneurysm NOS (ICD10-I71.9).  Aortic Atherosclerosis (ICD10-I70.0).   Electronically Signed   By: Judie Petit.  Shick M.D.   On: 09/13/2020 09:56  Reproductive/Obstetrics negative OB ROS                            Anesthesia Physical Anesthesia Plan  ASA: 4  Anesthesia Plan: General   Post-op Pain Management:    Induction:   PONV Risk Score and Plan: 4 or greater and Ondansetron  Airway Management Planned: LMA  Additional Equipment:   Intra-op Plan:   Post-operative Plan: Extubation in OR  Informed Consent: I have reviewed the patients History and Physical, chart, labs and discussed the procedure including the risks, benefits and alternatives for the proposed anesthesia with the patient or authorized representative who has indicated his/her understanding and acceptance.  Dental advisory given  Plan Discussed with: CRNA and  Surgeon  Anesthesia Plan Comments:        Anesthesia Quick Evaluation

## 2021-01-19 NOTE — Anesthesia Procedure Notes (Signed)
Procedure Name: LMA Insertion Date/Time: 01/19/2021 9:53 AM Performed by: Lorin Glass, CRNA Pre-anesthesia Checklist: Patient identified, Emergency Drugs available, Suction available and Patient being monitored Patient Re-evaluated:Patient Re-evaluated prior to induction Oxygen Delivery Method: Circle system utilized Preoxygenation: Pre-oxygenation with 100% oxygen Induction Type: IV induction LMA: LMA inserted LMA Size: 3.0 Number of attempts: 1 Placement Confirmation: positive ETCO2 Tube secured with: Tape Dental Injury: Teeth and Oropharynx as per pre-operative assessment

## 2021-01-19 NOTE — Transfer of Care (Signed)
Immediate Anesthesia Transfer of Care Note  Patient: [name]  Procedure(s) Performed: CYSTOSCOPY WITH RETROGRADE PYELOGRAM, URETEROSCOPY AND STENT EXCHANGE (Right: Ureter) HOLMIUM LASER APPLICATION (Right: Ureter)  Patient Location: PACU  Anesthesia Type:General  Level of Consciousness: awake  Airway & Oxygen Therapy: Patient Spontanous Breathing and Patient connected to nasal cannula oxygen  Post-op Assessment: Report given to RN and Post -op Vital signs reviewed and stable  Post vital signs: Reviewed and stable  Last Vitals:  Vitals Value Taken Time  BP 114/62   Temp    Pulse 74   Resp    SpO2 99%     Last Pain:  Vitals:   01/19/21 0823  TempSrc: Oral  PainSc: 0-No pain      Patients Stated Pain Goal: 6 (01/19/21 0823)  Complications: No notable events documented.

## 2021-01-19 NOTE — Op Note (Signed)
Preoperative diagnosis: Right ureteral stone  Postoperative diagnosis: Same  Procedure: 1 cystoscopy 2 right retrograde pyelography 3.  Intraoperative fluoroscopy, under one hour, with interpretation 4.  Right ureteroscopic stone manipulation with laser lithotripsy 5.  Right 6 x 24 JJ stent placement  Attending: Cleda Mccreedy  Anesthesia: General  Estimated blood loss: None  Drains: Right 6 x 24 JJ ureteral stent with tether  Specimens: stone for analysis  Antibiotics: ancef  Findings: right distal ureteral calculus. No hydronephrosis. No masses/lesions in the bladder.  Indications: Patient is a 87 year old female with a history of a right ureteral stone and who underwent right ureteral stent placement 2 weeks ago for sepsis.  After discussing treatment options, she decided proceed with right ureteroscopic stone manipulation.  Procedure in detail: The patient was brought to the operating room and a brief timeout was done to ensure correct patient, correct procedure, correct site.  General anesthesia was administered patient was placed in dorsal lithotomy position.  Her genitalia was then prepped and draped in usual sterile fashion.  A rigid 22 French cystoscope was passed in the urethra and the bladder.  Bladder was inspected free masses or lesions.  the right ureteral orifices were in the normal orthotopic locations.  a 6 french ureteral catheter was then instilled into the right ureter orifice.  a gentle retrograde was obtained and findings noted above. Using a grasper the right ureteral stent was brought to the urethral meatus. we then placed a zip wire through the ureteral stent and advanced up to the renal pelvis. We then removed the stent. we then removed the cystoscope and cannulated the right ureteral orifice with a semirigid ureteroscope.  we then encountered the stone in the distal ureter.  using a 242 nm laser fiber and fragmented the stone into smaller pieces.  the pieces  were then removed with a Ngage basket.  Once all stone fragments were removed we then placed a 6 x 24 double-j ureteral stent over the original zip wire. We then removed the wire and good coil was noted in the the renal pelvis under fluoroscopy and the bladder under direct vision. .   the bladder was then drained and this concluded the procedure which was well tolerated by patient.  Complications: None  Condition: Stable, extubated, transferred to PACU  Plan: Patient is to be discharged home as to follow-up in one week. She is to remove her sten tin 72 hours by pulling the tether.

## 2021-01-19 NOTE — Interval H&P Note (Signed)
History and Physical Interval Note:  01/19/2021 9:20 AM  [name]  has presented today for surgery, with the diagnosis of right ureteral calculus.  The various methods of treatment have been discussed with the patient and family. After consideration of risks, benefits and other options for treatment, the patient has consented to  Procedure(s): CYSTOSCOPY WITH RETROGRADE PYELOGRAM, URETEROSCOPY AND STENT EXCHANGE (Right) HOLMIUM LASER APPLICATION (Right) as a surgical intervention.  The patient's history has been reviewed, patient examined, no change in status, stable for surgery.  I have reviewed the patient's chart and labs.  Questions were answered to the patient's satisfaction.     Wilkie Aye

## 2021-01-20 ENCOUNTER — Encounter (HOSPITAL_COMMUNITY): Payer: Self-pay | Admitting: Urology

## 2021-01-20 DIAGNOSIS — E1165 Type 2 diabetes mellitus with hyperglycemia: Secondary | ICD-10-CM | POA: Diagnosis not present

## 2021-01-20 DIAGNOSIS — I10 Essential (primary) hypertension: Secondary | ICD-10-CM | POA: Diagnosis not present

## 2021-01-20 DIAGNOSIS — I509 Heart failure, unspecified: Secondary | ICD-10-CM | POA: Diagnosis not present

## 2021-01-20 DIAGNOSIS — N200 Calculus of kidney: Secondary | ICD-10-CM | POA: Diagnosis not present

## 2021-01-20 DIAGNOSIS — E785 Hyperlipidemia, unspecified: Secondary | ICD-10-CM | POA: Diagnosis not present

## 2021-01-20 NOTE — Progress Notes (Signed)
Patient calls the preop office to say that "I have been very careful since my surgery yesterday, but my stent and wires have come out". I gave her Dr Dimas Millin office number to have her contact him. She called back and states "that number dosen't work". I double checked all our numbers and this is the only number we have. This is also the number that the operator has. I attempted to call the number as well with no answer. I told patient that she needed to go to the ED so she could be examined and that the urologist on call could see her.

## 2021-01-23 DIAGNOSIS — I509 Heart failure, unspecified: Secondary | ICD-10-CM | POA: Diagnosis not present

## 2021-01-23 DIAGNOSIS — Z9181 History of falling: Secondary | ICD-10-CM | POA: Diagnosis not present

## 2021-01-23 DIAGNOSIS — I5020 Unspecified systolic (congestive) heart failure: Secondary | ICD-10-CM | POA: Diagnosis not present

## 2021-01-23 DIAGNOSIS — N390 Urinary tract infection, site not specified: Secondary | ICD-10-CM | POA: Diagnosis not present

## 2021-01-23 DIAGNOSIS — A4151 Sepsis due to Escherichia coli [E. coli]: Secondary | ICD-10-CM | POA: Diagnosis not present

## 2021-01-23 DIAGNOSIS — R531 Weakness: Secondary | ICD-10-CM | POA: Diagnosis not present

## 2021-01-23 DIAGNOSIS — Z7901 Long term (current) use of anticoagulants: Secondary | ICD-10-CM | POA: Diagnosis not present

## 2021-01-23 DIAGNOSIS — R197 Diarrhea, unspecified: Secondary | ICD-10-CM | POA: Diagnosis not present

## 2021-01-23 DIAGNOSIS — I4891 Unspecified atrial fibrillation: Secondary | ICD-10-CM | POA: Diagnosis not present

## 2021-01-23 DIAGNOSIS — K219 Gastro-esophageal reflux disease without esophagitis: Secondary | ICD-10-CM | POA: Diagnosis not present

## 2021-01-23 DIAGNOSIS — I110 Hypertensive heart disease with heart failure: Secondary | ICD-10-CM | POA: Diagnosis not present

## 2021-01-23 DIAGNOSIS — N200 Calculus of kidney: Secondary | ICD-10-CM | POA: Diagnosis not present

## 2021-01-23 DIAGNOSIS — E785 Hyperlipidemia, unspecified: Secondary | ICD-10-CM | POA: Diagnosis not present

## 2021-01-24 ENCOUNTER — Other Ambulatory Visit: Payer: Self-pay | Admitting: Thoracic Surgery (Cardiothoracic Vascular Surgery)

## 2021-01-24 DIAGNOSIS — A4151 Sepsis due to Escherichia coli [E. coli]: Secondary | ICD-10-CM | POA: Diagnosis not present

## 2021-01-24 DIAGNOSIS — I712 Thoracic aortic aneurysm, without rupture, unspecified: Secondary | ICD-10-CM

## 2021-01-24 DIAGNOSIS — I5020 Unspecified systolic (congestive) heart failure: Secondary | ICD-10-CM | POA: Diagnosis not present

## 2021-01-24 DIAGNOSIS — N390 Urinary tract infection, site not specified: Secondary | ICD-10-CM | POA: Diagnosis not present

## 2021-01-24 DIAGNOSIS — E785 Hyperlipidemia, unspecified: Secondary | ICD-10-CM | POA: Diagnosis not present

## 2021-01-24 DIAGNOSIS — K219 Gastro-esophageal reflux disease without esophagitis: Secondary | ICD-10-CM | POA: Diagnosis not present

## 2021-01-24 DIAGNOSIS — N200 Calculus of kidney: Secondary | ICD-10-CM | POA: Diagnosis not present

## 2021-01-24 DIAGNOSIS — Z9181 History of falling: Secondary | ICD-10-CM | POA: Diagnosis not present

## 2021-01-24 DIAGNOSIS — Z7901 Long term (current) use of anticoagulants: Secondary | ICD-10-CM | POA: Diagnosis not present

## 2021-01-24 DIAGNOSIS — I4891 Unspecified atrial fibrillation: Secondary | ICD-10-CM | POA: Diagnosis not present

## 2021-01-24 DIAGNOSIS — R197 Diarrhea, unspecified: Secondary | ICD-10-CM | POA: Diagnosis not present

## 2021-01-24 DIAGNOSIS — R531 Weakness: Secondary | ICD-10-CM | POA: Diagnosis not present

## 2021-01-24 DIAGNOSIS — I110 Hypertensive heart disease with heart failure: Secondary | ICD-10-CM | POA: Diagnosis not present

## 2021-01-24 LAB — CALCULI, WITH PHOTOGRAPH (CLINICAL LAB)
Calcium Oxalate Dihydrate: 10 %
Calcium Oxalate Monohydrate: 90 %
Weight Calculi: 59 mg

## 2021-01-25 ENCOUNTER — Encounter: Payer: Self-pay | Admitting: Urology

## 2021-01-25 ENCOUNTER — Other Ambulatory Visit: Payer: Self-pay

## 2021-01-25 ENCOUNTER — Ambulatory Visit: Payer: PPO | Admitting: Urology

## 2021-01-25 VITALS — BP 135/84 | HR 67

## 2021-01-25 DIAGNOSIS — K219 Gastro-esophageal reflux disease without esophagitis: Secondary | ICD-10-CM | POA: Diagnosis not present

## 2021-01-25 DIAGNOSIS — R531 Weakness: Secondary | ICD-10-CM | POA: Diagnosis not present

## 2021-01-25 DIAGNOSIS — Z9181 History of falling: Secondary | ICD-10-CM | POA: Diagnosis not present

## 2021-01-25 DIAGNOSIS — I4891 Unspecified atrial fibrillation: Secondary | ICD-10-CM | POA: Diagnosis not present

## 2021-01-25 DIAGNOSIS — R197 Diarrhea, unspecified: Secondary | ICD-10-CM | POA: Diagnosis not present

## 2021-01-25 DIAGNOSIS — I110 Hypertensive heart disease with heart failure: Secondary | ICD-10-CM | POA: Diagnosis not present

## 2021-01-25 DIAGNOSIS — I5020 Unspecified systolic (congestive) heart failure: Secondary | ICD-10-CM | POA: Diagnosis not present

## 2021-01-25 DIAGNOSIS — A4151 Sepsis due to Escherichia coli [E. coli]: Secondary | ICD-10-CM | POA: Diagnosis not present

## 2021-01-25 DIAGNOSIS — E785 Hyperlipidemia, unspecified: Secondary | ICD-10-CM | POA: Diagnosis not present

## 2021-01-25 DIAGNOSIS — Z87442 Personal history of urinary calculi: Secondary | ICD-10-CM

## 2021-01-25 DIAGNOSIS — N200 Calculus of kidney: Secondary | ICD-10-CM | POA: Diagnosis not present

## 2021-01-25 DIAGNOSIS — Z7901 Long term (current) use of anticoagulants: Secondary | ICD-10-CM | POA: Diagnosis not present

## 2021-01-25 DIAGNOSIS — N390 Urinary tract infection, site not specified: Secondary | ICD-10-CM | POA: Diagnosis not present

## 2021-01-25 LAB — URINALYSIS, ROUTINE W REFLEX MICROSCOPIC
Bilirubin, UA: NEGATIVE
Glucose, UA: NEGATIVE
Ketones, UA: NEGATIVE
Leukocytes,UA: NEGATIVE
Nitrite, UA: NEGATIVE
Protein,UA: NEGATIVE
RBC, UA: NEGATIVE
Specific Gravity, UA: 1.01 (ref 1.005–1.030)
Urobilinogen, Ur: 0.2 mg/dL (ref 0.2–1.0)
pH, UA: 5.5 (ref 5.0–7.5)

## 2021-01-25 NOTE — Patient Instructions (Signed)
Dietary Guidelines to Help Prevent Kidney Stones Kidney stones are deposits of minerals and salts that form inside your kidneys. Your risk of developing kidney stones may be greater depending on your diet, your lifestyle, the medicines you take, and whether you have certain medical conditions. Most people can lower their chances of developing kidney stones by following the instructions below. Your dietitian may give you more specific instructions depending on your overall health and the type of kidney stones you tend to develop. What are tips for following this plan? Reading food labels  Choose foods with "no salt added" or "low-salt" labels. Limit your salt (sodium) intake to less than 1,500 mg a day. Choose foods with calcium for each meal and snack. Try to eat about 300 mg of calcium at each meal. Foods that contain 200-500 mg of calcium a serving include: 8 oz (237 mL) of milk, calcium-fortifiednon-dairy milk, and calcium-fortifiedfruit juice. Calcium-fortified means that calcium has been added to these drinks. 8 oz (237 mL) of kefir, yogurt, and soy yogurt. 4 oz (114 g) of tofu. 1 oz (28 g) of cheese. 1 cup (150 g) of dried figs. 1 cup (91 g) of cooked broccoli. One 3 oz (85 g) can of sardines or mackerel. Most people need 1,000-1,500 mg of calcium a day. Talk to your dietitian about how much calcium is recommended for you. Shopping Buy plenty of fresh fruits and vegetables. Most people do not need to avoid fruits and vegetables, even if these foods contain nutrients that may contribute to kidney stones. When shopping for convenience foods, choose: Whole pieces of fruit. Pre-made salads with dressing on the side. Low-fat fruit and yogurt smoothies. Avoid buying frozen meals or prepared deli foods. These can be high in sodium. Look for foods with live cultures, such as yogurt and kefir. Choose high-fiber grains, such as whole-wheat breads, oat bran, and wheat cereals. Cooking Do not add  salt to food when cooking. Place a salt shaker on the table and allow each person to add his or her own salt to taste. Use vegetable protein, such as beans, textured vegetable protein (TVP), or tofu, instead of meat in pasta, casseroles, and soups. Meal planning Eat less salt, if told by your dietitian. To do this: Avoid eating processed or pre-made food. Avoid eating fast food. Eat less animal protein, including cheese, meat, poultry, or fish, if told by your dietitian. To do this: Limit the number of times you have meat, poultry, fish, or cheese each week. Eat a diet free of meat at least 2 days a week. Eat only one serving each day of meat, poultry, fish, or seafood. When you prepare animal protein, cut pieces into small portion sizes. For most meat and fish, one serving is about the size of the palm of your hand. Eat at least five servings of fresh fruits and vegetables each day. To do this: Keep fruits and vegetables on hand for snacks. Eat one piece of fruit or a handful of berries with breakfast. Have a salad and fruit at lunch. Have two kinds of vegetables at dinner. Limit foods that are high in a substance called oxalate. These include: Spinach (cooked), rhubarb, beets, sweet potatoes, and Swiss chard. Peanuts. Potato chips, french fries, and baked potatoes with skin on. Nuts and nut products. Chocolate. If you regularly take a diuretic medicine, make sure to eat at least 1 or 2 servings of fruits or vegetables that are high in potassium each day. These include: Avocado. Banana. Orange, prune,   carrot, or tomato juice. Baked potato. Cabbage. Beans and split peas. Lifestyle  Drink enough fluid to keep your urine pale yellow. This is the most important thing you can do. Spread your fluid intake throughout the day. If you drink alcohol: Limit how much you use to: 0-1 drink a day for women who are not pregnant. 0-2 drinks a day for men. Be aware of how much alcohol is in your  drink. In the [place] one drink equals one 12 oz bottle of beer (355 mL), one 5 oz glass of wine (148 mL), or one 1 oz glass of hard liquor (44 mL). Lose weight if told by your health care provider. Work with your dietitian to find an eating plan and weight loss strategies that work best for you. General information Talk to your health care provider and dietitian about taking daily supplements. You may be told the following depending on your health and the cause of your kidney stones: Not to take supplements with vitamin C. To take a calcium supplement. To take a daily probiotic supplement. To take other supplements such as magnesium, fish oil, or vitamin B6. Take over-the-counter and prescription medicines only as told by your health care provider. These include supplements. What foods should I limit? Limit your intake of the following foods, or eat them as told by your dietitian. Vegetables Spinach. Rhubarb. Beets. Canned vegetables. Pickles. Olives. Baked potatoes with skin. Grains Wheat bran. Baked goods. Salted crackers. Cereals high in sugar. Meats and other proteins Nuts. Nut butters. Large portions of meat, poultry, or fish. Salted, precooked, or cured meats, such as sausages, meat loaves, and hot dogs. Dairy Cheese. Beverages Regular soft drinks. Regular vegetable juice. Seasonings and condiments Seasoning blends with salt. Salad dressings. Soy sauce. Ketchup. Barbecue sauce. Other foods Canned soups. Canned pasta sauce. Casseroles. Pizza. Lasagna. Frozen meals. Potato chips. French fries. The items listed above may not be a complete list of foods and beverages you should limit. Contact a dietitian for more information. What foods should I avoid? Talk to your dietitian about specific foods you should avoid based on the type of kidney stones you have and your overall health. Fruits Grapefruit. The item listed above may not be a complete list of foods and beverages you should  avoid. Contact a dietitian for more information. Summary Kidney stones are deposits of minerals and salts that form inside your kidneys. You can lower your risk of kidney stones by making changes to your diet. The most important thing you can do is drink enough fluid. Drink enough fluid to keep your urine pale yellow. Talk to your dietitian about how much calcium you should have each day, and eat less salt and animal protein as told by your dietitian. This information is not intended to replace advice given to you by your health care provider. Make sure you discuss any questions you have with your health care provider. Document Revised: 04/02/2019 Document Reviewed: 04/02/2019 Elsevier Patient Education  2022 [employer] Inc.  

## 2021-01-25 NOTE — Progress Notes (Signed)
01/25/2021 12:04 PM   [name] 1928-05-20 [id number]  Referring provider: Benita Stabile, MD [address] [name],  [place] [address]  Followup nephrolithiasis   HPI: [name] is a 87yo here for followup for nephrolithiasis. She underwent right ureteral stone extraction 1 week ago and removed her stent POD#2. No flank pain. No LUTS.  UA normal    PMH: Past Medical History:  Diagnosis Date   Anemia    Ascending aortic aneurysm    a. 4.6cm by CT 07/2016.   Chronic diastolic CHF (congestive heart failure) (HCC)    Coronary artery calcification seen on CT scan 07/2016   Dysrhythmia    a fib   History of blood transfusion    History of kidney stones    Hypertension    Hypomagnesemia    PAF (paroxysmal atrial fibrillation) (HCC)    a. dx during adm 08/2016.   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   Negative Jak2, BCR/ABL, normal epo level on 02/01/2014   Sepsis (HCC) 08/2016   Thrombocytopenia (HCC)    Trigeminal neuralgia     Surgical History: Past Surgical History:  Procedure Laterality Date   BREAST CYST EXCISION  60 yrs ago   COLONOSCOPY WITH ESOPHAGOGASTRODUODENOSCOPY (EGD) N/A 03/10/2013   Procedure: COLONOSCOPY WITH ESOPHAGOGASTRODUODENOSCOPY (EGD);  Surgeon: Malissa Hippo, MD;  [facility]: [facility];  Service: Endoscopy;  Laterality: N/A;  730   CYSTOSCOPY W/ URETERAL STENT PLACEMENT Right 01/02/2021   Procedure: CYSTOSCOPY WITH RETROGRADE PYELOGRAM/URETERAL STENT PLACEMENT;  Surgeon: Malen Gauze, MD;  Location: [facility];  Service: Urology;  Laterality: Right;   CYSTOSCOPY WITH RETROGRADE PYELOGRAM, URETEROSCOPY AND STENT PLACEMENT Right 01/19/2021   Procedure: CYSTOSCOPY WITH RETROGRADE PYELOGRAM, URETEROSCOPY AND STENT EXCHANGE;  Surgeon: Malen Gauze, MD;  Location: [facility];  Service: Urology;  Laterality: Right;   EXTRACORPOREAL SHOCK WAVE LITHOTRIPSY Right 09/20/2016   Procedure: RIGHT EXTRACORPOREAL SHOCK WAVE LITHOTRIPSY (ESWL);  Surgeon:  Alfredo Martinez, MD;  Location: [facility];  Service: Urology;  Laterality: Right;   EXTRACORPOREAL SHOCK WAVE LITHOTRIPSY Right 02/16/2019   Procedure: EXTRACORPOREAL SHOCK WAVE LITHOTRIPSY (ESWL);  Surgeon: Marcine Matar, MD;  Location: [facility];  Service: Urology;  Laterality: Right;   EXTRACORPOREAL SHOCK WAVE LITHOTRIPSY Right 04/27/2019   Procedure: EXTRACORPOREAL SHOCK WAVE LITHOTRIPSY (ESWL);  Surgeon: Marcine Matar, MD;  Location: [facility];  Service: Urology;  Laterality: Right;   EYE SURGERY     cataract surgery bilat    Gamma Knife     Trigeminal    HOLMIUM LASER APPLICATION Right 01/19/2021   Procedure: HOLMIUM LASER APPLICATION;  Surgeon: Malen Gauze, MD;  Location: [facility];  Service: Urology;  Laterality: Right;   SLT LASER APPLICATION Left 09/27/2014   Procedure: SLT LASER APPLICATION;  Surgeon: Susa Simmonds, MD;  Location: [facility];  Service: Ophthalmology;  Laterality: Left;    Home Medications:  Allergies as of 01/25/2021       Reactions   Sulfa Antibiotics Other (See Comments)   Pt states that this med makes her feel crazy.          Medication List        Accurate as of January 25, 2021 12:04 PM. If you have any questions, ask your nurse or doctor.          amiodarone 200 MG tablet Commonly known as: PACERONE Take 1 tablet (200 mg total) by mouth 2 (two) times daily.   benzonatate 100 MG capsule Commonly known as: TESSALON  Take 1-2 capsules (100-200 mg total) by mouth 3 (three) times daily as needed for cough.   BIOTIN PLUS KERATIN PO Take 250 mg by mouth.   Calcium Carbonate-Vitamin D 600-400 MG-UNIT tablet Take 1 tablet by mouth daily.   Cinnamon 500 MG capsule Take 1,000 mg by mouth daily.   CoQ10 100 MG Caps Take 200 mg by mouth daily.   Eliquis 5 MG Tabs tablet Generic drug: apixaban TAKE (1) TABLET BY MOUTH TWICE DAILY.   esomeprazole 40 MG capsule Commonly known as: NEXIUM Take 40 mg by mouth daily.   ferrous sulfate 325  (65 FE) MG tablet Take 325 mg by mouth daily.   furosemide 20 MG tablet Commonly known as: Lasix Take 1 tablet (20 mg total) by mouth daily as needed. What changed: reasons to take this   gabapentin 300 MG capsule Commonly known as: NEURONTIN Take 300 mg by mouth at bedtime.   metoprolol succinate 25 MG 24 hr tablet Commonly known as: TOPROL-XL Take 1 tablet (25 mg total) by mouth in the morning and at bedtime.   multivitamin with minerals Tabs tablet Take 1 tablet by mouth daily.   potassium chloride SA 20 MEQ tablet Commonly known as: KLOR-CON Take 1 tablet (20 mEq total) by mouth daily.   pravastatin 40 MG tablet Commonly known as: PRAVACHOL Take 40 mg by mouth at bedtime.   traMADol 50 MG tablet Commonly known as: Ultram Take 1 tablet (50 mg total) by mouth every 6 (six) hours as needed.        Allergies:  Allergies  Allergen Reactions   Sulfa Antibiotics Other (See Comments)    Pt states that this med makes her feel crazy.      Family History: Family History  Problem Relation Age of Onset   Congestive Heart Failure Mother    Heart attack Father    CAD Neg Hx     Social History:  reports that she has never smoked. She has never used smokeless tobacco. She reports that she does not drink alcohol and does not use drugs.  ROS: All other review of systems were reviewed and are negative except what is noted above in HPI  Physical Exam: BP 135/84   Pulse 67   Constitutional:  Alert and oriented, No acute distress. HEENT: [place] AT, moist mucus membranes.  Trachea midline, no masses. Cardiovascular: No clubbing, cyanosis, or edema. Respiratory: Normal respiratory effort, no increased work of breathing. GI: Abdomen is soft, nontender, nondistended, no abdominal masses GU: No CVA tenderness.  Lymph: No cervical or inguinal lymphadenopathy. Skin: No rashes, bruises or suspicious lesions. Neurologic: Grossly intact, no focal deficits, moving all 4  extremities. Psychiatric: Normal mood and affect.  Laboratory Data: Lab Results  Component Value Date   WBC 9.0 01/05/2021   HGB 12.5 01/05/2021   HCT 38.6 01/05/2021   MCV 95.3 01/05/2021   PLT 83 (L) 01/05/2021    Lab Results  Component Value Date   CREATININE 0.57 01/07/2021    No results found for: PSA  No results found for: TESTOSTERONE  No results found for: HGBA1C  Urinalysis    Component Value Date/Time   COLORURINE YELLOW 01/01/2021 2300   APPEARANCEUR HAZY (A) 01/01/2021 2300   APPEARANCEUR Hazy (A) 11/15/2020 0944   LABSPEC 1.025 01/01/2021 2300   PHURINE 6.0 01/01/2021 2300   GLUCOSEU NEGATIVE 01/01/2021 2300   HGBUR LARGE (A) 01/01/2021 2300   BILIRUBINUR NEGATIVE 01/01/2021 2300   BILIRUBINUR Negative 11/15/2020 0944  KETONESUR >80 (A) 01/01/2021 2300   PROTEINUR 30 (A) 01/01/2021 2300   UROBILINOGEN negative (A) 05/19/2019 0844   NITRITE POSITIVE (A) 01/01/2021 2300   LEUKOCYTESUR SMALL (A) 01/01/2021 2300    Lab Results  Component Value Date   LABMICR See below: 11/15/2020   WBCUA >30 (A) 11/15/2020   LABEPIT 0-10 11/15/2020   BACTERIA MANY (A) 01/01/2021    Pertinent Imaging:  Results for orders placed during the hospital encounter of 11/14/20  DG Abd 1 View  Narrative CLINICAL DATA:  History of kidney stones  EXAM: ABDOMEN - 1 VIEW  COMPARISON:  11/17/2019  FINDINGS: Small calculus projects over the inferior pole of the right kidney. No other radiopaque calculi identified. Probable small phlebolith in the low pelvis, unchanged. Disc degenerative disease of the thoracic spine. Nonobstructive pattern of bowel gas.  IMPRESSION: Small calculus projects over the inferior pole of the right kidney. No other radiopaque calculi identified. Probable small phlebolith in the low pelvis, unchanged.   Electronically Signed By: Lauralyn Primes M.D. On: 11/15/2020 10:56  No results found for this or any previous visit.  No results  found for this or any previous visit.  No results found for this or any previous visit.  Results for orders placed during the hospital encounter of 09/24/16  US Renal  Narrative CLINICAL DATA:  Follow-up right renal calculus, left kidney cyst. Prior lithotripsy.  EXAM: RENAL / URINARY TRACT ULTRASOUND COMPLETE  COMPARISON:  Plain film 09/20/2016.  Ultrasound 08/21/2016.  FINDINGS: Right Kidney:  Length: 12.1 cm. Moderate right hydronephrosis, unchanged after voiding. No visible stone. Normal echotexture. No mass.  Left Kidney:  Length: 9.9 cm. Normal echotexture. No hydronephrosis. 2.2 cm cyst appears benign. Prevoid volume 243 mL. Postvoid volume 36 mL  Bladder:  Appears normal for degree of bladder distention.  IMPRESSION: Moderate right hydronephrosis, unchanged after voiding.  Benign-appearing left renal cyst.   Electronically Signed By: Charlett Nose M.D. On: 09/24/2016 12:52  No results found for this or any previous visit.  No results found for this or any previous visit.  Results for orders placed during the hospital encounter of 01/01/21  CT RENAL STONE STUDY  Narrative CLINICAL DATA:  Flank pain.  Concern for kidney stone.  EXAM: CT ABDOMEN AND PELVIS WITHOUT CONTRAST  TECHNIQUE: Multidetector CT imaging of the abdomen and pelvis was performed following the standard protocol without IV contrast.  COMPARISON:  CT of the abdomen pelvis dated 08/19/2016.  FINDINGS: Evaluation of this exam is limited in the absence of intravenous contrast.  Lower chest: Minimal bibasilar atelectasis. There is coronary vascular calcification.  No intra-abdominal free air or free fluid.  Hepatobiliary: The liver is unremarkable. No intrahepatic biliary ductal dilatation. The gallbladder is unremarkable.  Pancreas: The pancreas is unremarkable.  Spleen: Normal in size without focal abnormality.  Adrenals/Urinary Tract: The adrenal glands unremarkable.  There is a 7 mm stone in the distal right ureter with mild right hydronephrosis. There is mild right perinephric stranding. Correlation with urinalysis recommended to exclude UTI. There is a 3 cm left renal cyst. No hydronephrosis or nephrolithiasis on the left the left ureter and urinary bladder appear unremarkable.  Stomach/Bowel: Moderate size hiatal hernia. There is sigmoid diverticulosis without active inflammatory changes. There is no bowel obstruction or active inflammation. The appendix is normal.  Vascular/Lymphatic: Advanced aortoiliac atherosclerotic disease. The IVC is grossly unremarkable. No portal venous gas. There is no adenopathy.  Reproductive: The uterus is anteverted and grossly unremarkable. No adnexal masses.  Other: Induration of the subcutaneous soft tissues of the right gluteal region. No fluid collection.  Musculoskeletal: Osteopenia with degenerative changes of the spine. Grade 1 L4-L5 anterolisthesis. No acute osseous pathology.  IMPRESSION: 1. A 7 mm distal right ureteral stone with mild right hydronephrosis. Correlation with urinalysis recommended to exclude UTI. 2. Sigmoid diverticulosis. No bowel obstruction. Normal appendix. 3. Aortic Atherosclerosis (ICD10-I70.0).   Electronically Signed By: Elgie Collard M.D. On: 01/02/2021 01:34   Assessment & Plan:    1. History of kidney stones -RTC 6 weeks with renal US   No follow-ups on file.  Wilkie Aye, MD  [facility]

## 2021-01-25 NOTE — Progress Notes (Signed)

## 2021-01-30 ENCOUNTER — Telehealth: Payer: Self-pay | Admitting: Student

## 2021-01-30 NOTE — Telephone Encounter (Signed)
Patient called requesting to speak with a nurse in regards to medication amiodarone 200 mg  [phone]

## 2021-01-30 NOTE — Telephone Encounter (Signed)
Pt stated that her Urologist wants her to lean away from Amiodarone, but states that he did not tell her why.  Reviewed note from last ov with urology (10/5) with no mention of the medication.

## 2021-01-30 NOTE — Telephone Encounter (Signed)
   This was started by Dr. Diona Browner during her admission in 12/2020 for atrial fibrillation with RVR. She did have pyelonephritis that admission and there was concern with her being on anticoagulation at that time but was cleared by Urology to restart that admission. It was mentioned in Dr. Ival Bible note to continue Amiodarone 200mg  BID for 5 days then reduce to 200mg  once daily but is listed in her discharge summary to be on BID dosing and I do not see the 5 day taper listed. Please make sure she did reduce this to 200mg  daily and if not, would do so now. With Amiodarone, we do check liver function and thyroid function at least every 6 months but no specific Urologic test.   Signed, , PA-C 01/30/2021, 3:39 PM

## 2021-01-31 MED ORDER — AMIODARONE HCL 200 MG PO TABS: 200.0000 mg | ORAL_TABLET | Freq: Every day | ORAL | 3 refills | Status: DC

## 2021-01-31 NOTE — Telephone Encounter (Signed)
Pt notified and agrees to decrease Amiodarone to  200 mg Daily.

## 2021-02-03 DIAGNOSIS — J00 Acute nasopharyngitis [common cold]: Secondary | ICD-10-CM | POA: Diagnosis not present

## 2021-02-06 DIAGNOSIS — I739 Peripheral vascular disease, unspecified: Secondary | ICD-10-CM | POA: Diagnosis not present

## 2021-02-06 DIAGNOSIS — B351 Tinea unguium: Secondary | ICD-10-CM | POA: Diagnosis not present

## 2021-02-06 DIAGNOSIS — M79675 Pain in left toe(s): Secondary | ICD-10-CM | POA: Diagnosis not present

## 2021-02-06 DIAGNOSIS — L851 Acquired keratosis [keratoderma] palmaris et plantaris: Secondary | ICD-10-CM | POA: Diagnosis not present

## 2021-02-07 DIAGNOSIS — E785 Hyperlipidemia, unspecified: Secondary | ICD-10-CM | POA: Diagnosis not present

## 2021-02-07 DIAGNOSIS — I509 Heart failure, unspecified: Secondary | ICD-10-CM | POA: Diagnosis not present

## 2021-02-07 DIAGNOSIS — I4891 Unspecified atrial fibrillation: Secondary | ICD-10-CM | POA: Diagnosis not present

## 2021-02-07 DIAGNOSIS — J00 Acute nasopharyngitis [common cold]: Secondary | ICD-10-CM | POA: Diagnosis not present

## 2021-02-07 DIAGNOSIS — R531 Weakness: Secondary | ICD-10-CM | POA: Diagnosis not present

## 2021-02-07 DIAGNOSIS — I110 Hypertensive heart disease with heart failure: Secondary | ICD-10-CM | POA: Diagnosis not present

## 2021-02-08 DIAGNOSIS — Z9181 History of falling: Secondary | ICD-10-CM | POA: Diagnosis not present

## 2021-02-08 DIAGNOSIS — J00 Acute nasopharyngitis [common cold]: Secondary | ICD-10-CM | POA: Diagnosis not present

## 2021-02-08 DIAGNOSIS — I4891 Unspecified atrial fibrillation: Secondary | ICD-10-CM | POA: Diagnosis not present

## 2021-02-08 DIAGNOSIS — R531 Weakness: Secondary | ICD-10-CM | POA: Diagnosis not present

## 2021-02-08 DIAGNOSIS — I110 Hypertensive heart disease with heart failure: Secondary | ICD-10-CM | POA: Diagnosis not present

## 2021-02-08 DIAGNOSIS — N200 Calculus of kidney: Secondary | ICD-10-CM | POA: Diagnosis not present

## 2021-02-08 DIAGNOSIS — E785 Hyperlipidemia, unspecified: Secondary | ICD-10-CM | POA: Diagnosis not present

## 2021-02-08 DIAGNOSIS — Z7901 Long term (current) use of anticoagulants: Secondary | ICD-10-CM | POA: Diagnosis not present

## 2021-02-08 DIAGNOSIS — R1310 Dysphagia, unspecified: Secondary | ICD-10-CM | POA: Diagnosis not present

## 2021-02-08 DIAGNOSIS — K219 Gastro-esophageal reflux disease without esophagitis: Secondary | ICD-10-CM | POA: Diagnosis not present

## 2021-02-08 DIAGNOSIS — A4151 Sepsis due to Escherichia coli [E. coli]: Secondary | ICD-10-CM | POA: Diagnosis not present

## 2021-02-08 DIAGNOSIS — N390 Urinary tract infection, site not specified: Secondary | ICD-10-CM | POA: Diagnosis not present

## 2021-02-08 DIAGNOSIS — I5020 Unspecified systolic (congestive) heart failure: Secondary | ICD-10-CM | POA: Diagnosis not present

## 2021-02-08 DIAGNOSIS — R197 Diarrhea, unspecified: Secondary | ICD-10-CM | POA: Diagnosis not present

## 2021-02-09 ENCOUNTER — Encounter: Payer: Self-pay | Admitting: Student

## 2021-02-09 ENCOUNTER — Ambulatory Visit: Payer: PPO | Admitting: Student

## 2021-02-09 ENCOUNTER — Other Ambulatory Visit: Payer: Self-pay

## 2021-02-09 VITALS — BP 126/80 | HR 70 | Wt 132.0 lb

## 2021-02-09 DIAGNOSIS — I480 Paroxysmal atrial fibrillation: Secondary | ICD-10-CM | POA: Diagnosis not present

## 2021-02-09 DIAGNOSIS — I10 Essential (primary) hypertension: Secondary | ICD-10-CM

## 2021-02-09 DIAGNOSIS — I7121 Aneurysm of the ascending aorta, without rupture: Secondary | ICD-10-CM

## 2021-02-09 DIAGNOSIS — I38 Endocarditis, valve unspecified: Secondary | ICD-10-CM

## 2021-02-09 DIAGNOSIS — I2510 Atherosclerotic heart disease of native coronary artery without angina pectoris: Secondary | ICD-10-CM

## 2021-02-09 DIAGNOSIS — I5032 Chronic diastolic (congestive) heart failure: Secondary | ICD-10-CM

## 2021-02-09 DIAGNOSIS — Z79899 Other long term (current) drug therapy: Secondary | ICD-10-CM | POA: Diagnosis not present

## 2021-02-09 NOTE — Progress Notes (Signed)
Cardiology Office Note    Date:  02/09/2021   ID:  [name], DOB 07-Apr-1929, MRN [id number]  PCP:  Benita Stabile, MD  Cardiologist: Nona Dell, MD    Chief Complaint  Patient presents with   Hospitalization Follow-up    History of Present Illness:    [name] is a 87 y.o. female  with past medical history of paroxysmal atrial fibrillation (on Eliquis), coronary calcifications by prior CT, HFpEF, thoracic aortic aneurysm, nephrolithiasis and HTN who presents to the office today for hospital follow-up.  She was most recently admitted to [facility] from 9/11 - 01/07/2021 for sepsis in the setting of an E. coli UTI and pyelonephritis. This was appropriately treated with antibiotic therapy and Cardiology was consulted during admission for atrial fibrillation with RVR. Echocardiogram showed a preserved EF of 60 to 65% with no regional wall motion abnormalities. She did have mild to moderate MR, moderate to severe TR and mild to moderate aortic valve sclerosis without stenosis. She initially required IV Cardizem and IV Amiodarone with these being transitioned to PO medications including Amiodarone 200 mg twice daily for 5 days followed by 200 mg daily along with continuing on Toprol-XL 25 mg daily and Eliquis 5 mg twice daily for anticoagulation.  In talking with the patient today, she reports overall doing well since her hospitalization. Denies any recent chest pain or palpitations. Breathing has been at baseline with no recent orthopnea, PND or pitting edema. She had continued on Amiodarone 200mg  BID until 10/10, when this was reduced to 200mg  daily after she called the office. She reports tolerating Eliquis without any melena, hematochezia or hematuria.    Past Medical History:  Diagnosis Date   Anemia    Ascending aortic aneurysm    a. 4.6cm by CT 07/2016.   Chronic diastolic CHF (congestive heart failure) (HCC)    Coronary artery calcification seen on CT scan 07/2016    Dysrhythmia    a fib   History of blood transfusion    History of kidney stones    Hypertension    Hypomagnesemia    PAF (paroxysmal atrial fibrillation) (HCC)    a. dx during adm 08/2016.   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   Negative Jak2, BCR/ABL, normal epo level on 02/01/2014   Sepsis (HCC) 08/2016   Thrombocytopenia (HCC)    Trigeminal neuralgia     Past Surgical History:  Procedure Laterality Date   BREAST CYST EXCISION  60 yrs ago   COLONOSCOPY WITH ESOPHAGOGASTRODUODENOSCOPY (EGD) N/A 03/10/2013   Procedure: COLONOSCOPY WITH ESOPHAGOGASTRODUODENOSCOPY (EGD);  Surgeon: 09/2016, MD;  [facility];  Service: Endoscopy;  Laterality: N/A;  730   CYSTOSCOPY W/ URETERAL STENT PLACEMENT Right 01/02/2021   Procedure: CYSTOSCOPY WITH RETROGRADE PYELOGRAM/URETERAL STENT PLACEMENT;  Surgeon: Malissa Hippo, MD;  Location: [facility];  Service: Urology;  Laterality: Right;   CYSTOSCOPY WITH RETROGRADE PYELOGRAM, URETEROSCOPY AND STENT PLACEMENT Right 01/19/2021   Procedure: CYSTOSCOPY WITH RETROGRADE PYELOGRAM, URETEROSCOPY AND STENT EXCHANGE;  Surgeon: Malen Gauze, MD;  Location: [facility];  Service: Urology;  Laterality: Right;   EXTRACORPOREAL SHOCK WAVE LITHOTRIPSY Right 09/20/2016   Procedure: RIGHT EXTRACORPOREAL SHOCK WAVE LITHOTRIPSY (ESWL);  Surgeon: Malen Gauze, MD;  Location: [facility];  Service: Urology;  Laterality: Right;   EXTRACORPOREAL SHOCK WAVE LITHOTRIPSY Right 02/16/2019   Procedure: EXTRACORPOREAL SHOCK WAVE LITHOTRIPSY (ESWL);  Surgeon: Alfredo Martinez, MD;  Location: [facility];  Service: Urology;  Laterality: Right;   EXTRACORPOREAL SHOCK  WAVE LITHOTRIPSY Right 04/27/2019   Procedure: EXTRACORPOREAL SHOCK WAVE LITHOTRIPSY (ESWL);  Surgeon: Marcine Matar, MD;  Location: [facility];  Service: Urology;  Laterality: Right;   EYE SURGERY     cataract surgery bilat    Gamma Knife     Trigeminal    HOLMIUM LASER APPLICATION Right 01/19/2021    Procedure: HOLMIUM LASER APPLICATION;  Surgeon: Malen Gauze, MD;  Location: [facility];  Service: Urology;  Laterality: Right;   SLT LASER APPLICATION Left 09/27/2014   Procedure: SLT LASER APPLICATION;  Surgeon: Susa Simmonds, MD;  Location: [facility];  Service: Ophthalmology;  Laterality: Left;    Current Medications: Outpatient Medications Prior to Visit  Medication Sig Dispense Refill   amiodarone (PACERONE) 200 MG tablet Take 1 tablet (200 mg total) by mouth daily. 90 tablet 3   benzonatate (TESSALON) 100 MG capsule Take 1-2 capsules (100-200 mg total) by mouth 3 (three) times daily as needed for cough. 30 capsule 0   Calcium Carbonate-Vitamin D 600-400 MG-UNIT tablet Take 1 tablet by mouth daily.     Cinnamon 500 MG capsule Take 1,000 mg by mouth daily.     Coenzyme Q10 (COQ10) 100 MG CAPS Take 200 mg by mouth daily.      ELIQUIS 5 MG TABS tablet TAKE (1) TABLET BY MOUTH TWICE DAILY. 180 tablet 0   esomeprazole (NEXIUM) 40 MG capsule Take 40 mg by mouth daily.      ferrous sulfate 325 (65 FE) MG tablet Take 325 mg by mouth daily.      furosemide (LASIX) 20 MG tablet Take 1 tablet (20 mg total) by mouth daily as needed. 90 tablet 3   gabapentin (NEURONTIN) 300 MG capsule Take 300 mg by mouth at bedtime.      metoprolol succinate (TOPROL-XL) 25 MG 24 hr tablet Take 1 tablet (25 mg total) by mouth in the morning and at bedtime. 60 tablet 2   Multiple Vitamin (MULTIVITAMIN WITH MINERALS) TABS tablet Take 1 tablet by mouth daily.     potassium chloride SA (KLOR-CON) 20 MEQ tablet Take 1 tablet (20 mEq total) by mouth daily. 30 tablet 1   pravastatin (PRAVACHOL) 40 MG tablet Take 40 mg by mouth at bedtime.      Specialty Vitamins Products (BIOTIN PLUS KERATIN PO) Take 250 mg by mouth.     traMADol (ULTRAM) 50 MG tablet Take 1 tablet (50 mg total) by mouth every 6 (six) hours as needed. 15 tablet 0   No facility-administered medications prior to visit.     Allergies:   Sulfa antibiotics    Social History   Socioeconomic History   Marital status: Widowed    Spouse name: Not on file   Number of children: Not on file   Years of education: Not on file   Highest education level: Not on file  Occupational History   Not on file  Tobacco Use   Smoking status: Never   Smokeless tobacco: Never  Vaping Use   Vaping Use: Never used  Substance and Sexual Activity   Alcohol use: No   Drug use: No   Sexual activity: Never  Other Topics Concern   Not on file  Social History Narrative   Not on file   Social Determinants of Health   Financial Resource Strain: Not on file  Food Insecurity: Not on file  Transportation Needs: Not on file  Physical Activity: Not on file  Stress: Not on file  Social Connections: Not on file  Family History:  The patient's family history includes Congestive Heart Failure in her mother; Heart attack in her father.   Review of Systems:    Please see the history of present illness.     All other systems reviewed and are otherwise negative except as noted above.   Physical Exam:    VS:  BP 126/80   Pulse 70   Wt 132 lb (59.9 kg)   SpO2 100%   BMI 23.38 kg/m    General: Pleasant elderly female appearing in no acute distress. Head: Normocephalic, atraumatic. Neck: No carotid bruits. JVD not elevated.  Lungs: Respirations regular and unlabored, without wheezes or rales.  Heart: Regular rate and rhythm. No S3 or S4.  2/6 SEM along Apex.  Abdomen: Appears non-distended. No obvious abdominal masses. Msk:  Strength and tone appear normal for age. No obvious joint deformities or effusions. Extremities: No clubbing or cyanosis. No pitting edema.  Distal pedal pulses are 2+ bilaterally. Neuro: Alert and oriented X 3. Moves all extremities spontaneously. No focal deficits noted. Psych:  Responds to questions appropriately with a normal affect. Skin: No rashes or lesions noted  Wt Readings from Last 3 Encounters:  02/09/21 132 lb (59.9  kg)  01/16/21 145 lb 8.1 oz (66 kg)  01/04/21 145 lb 8.1 oz (66 kg)     Studies/Labs Reviewed:   EKG:  EKG is ordered today. The ekg ordered today demonstrates NSR, HR 74 with no acute ST changes when compared to prior tracings.   Recent Labs: 01/03/2021: ALT 20 01/05/2021: Hemoglobin 12.5; Platelets 83 01/06/2021: Magnesium 1.8 01/07/2021: BUN 8; Creatinine, Ser 0.57; Potassium 3.4; Sodium 142   Lipid Panel No results found for: CHOL, TRIG, HDL, CHOLHDL, VLDL, LDLCALC, LDLDIRECT  Additional studies/ records that were reviewed today include:   Echocardiogram: 01/03/2021 IMPRESSIONS     1. Left ventricular ejection fraction, by estimation, is 60 to 65%. The  left ventricle has normal function. The left ventricle has no regional  wall motion abnormalities. Left ventricular diastolic parameters were  normal.   2. Right ventricular systolic function is normal. The right ventricular  size is mildly enlarged. There is severely elevated pulmonary artery  systolic pressure. The estimated right ventricular systolic pressure is  63.4 mmHg.   3. Left atrial size was mildly dilated.   4. Right atrial size was mildly dilated.   5. The mitral valve is degenerative. Mild to moderate mitral valve  regurgitation with very eccentric jet.   6. Tricuspid valve regurgitation is moderate to severe.   7. The aortic valve is tricuspid. There is mild calcification of the  aortic valve. Aortic valve regurgitation is mild. Mild to moderate aortic  valve sclerosis/calcification is present, without any evidence of aortic  stenosis. Aortic valve mean gradient  measures 7.0 mmHg.   8. Aortic dilatation noted. There is moderate dilatation of the ascending  aorta, measuring 42 mm.   9. The inferior vena cava is dilated in size with <50% respiratory  variability, suggesting right atrial pressure of 15 mmHg.   Comparison(s): Prior images reviewed side by side. Degree of tricuspid  regurgitation and elevated  RVSP more prominent in comparison.   Assessment:    1. PAF (paroxysmal atrial fibrillation) (HCC)   2. Chronic diastolic CHF (congestive heart failure) (HCC)   3. Coronary artery calcification seen on CT scan   4. Medication management   5. Aneurysm of ascending aorta without rupture   6. Valvular heart disease   7. Essential hypertension  Plan:   In order of problems listed above:  1. Paroxysmal Atrial Fibrillation - She has known paroxysmal AF but had recurrence during her recent admission and rates were difficult to control with Cardizem and BB therapy. Was started on Amiodarone and has tolerated this well. I did recommend she continue on the lower dose of 200mg  daily for now. Recheck TSH and CMET in 3 months. Continue Toprol-XL 25mg  BID.  - She denies any evidence of active bleeding. Remains on Eliquis 5mg  BID for anticoagulation. Her weight has been between 132-134 lbs on her home scales which is borderline for a dose reduction of Eliquis. I encouraged her to reach out if her weight remains consistently less than 133 lbs as this would need to be reduced to 2.5mg  BID.   2. HFpEF - She reports her breathing has been stable and denies any recent orthopnea, PND or pitting edema. Continue Lasix 20mg  daily with K+ supplementation. Her creatinine was stable at 0.5 in 12/2020.   3. Coronary Calcification by CT - She denies any recent anginal symptoms and recent echo showed a preserved EF of 60-65% with no regional WMA. Continue with risk factor modification. She remains on Pravastatin 40mg  daily and Toprol-XL 25mg  BID. No ASA given the need for anticoagulation.   4. Thoracic Aortic Aneurysm - Followed by CT Surgery and she does have a follow-up visit scheduled for next month. Measured at 5.2 cm by CT in 08/2020.  5. Valvular Heart Disease - Recent echocardiogram showed mild to moderate MR, moderate to severe TR and mild to moderate aortic valve sclerosis without stenosis. Will  continue to follow.  6. HTN - Her BP is well-controlled at 126/80 during today's visit. Continue Lasix 20mg  daily and Toprol-XL 25mg  BID.    Medication Adjustments/Labs and Tests Ordered: Current medicines are reviewed at length with the patient today.  Concerns regarding medicines are outlined above.  Medication changes, Labs and Tests ordered today are listed in the Patient Instructions below. Patient Instructions  Medication Instructions:   No change in medication regimen.   Labwork:  Comprehensive Metabolic panel and TSH in 3 months.   Testing/Procedures:  None.   Follow-Up:  In 3-4 months in Dr.   Any Other Special Instructions Will Be Listed Below (If Applicable).     If you need a refill on your cardiac medications before your next appointment, please call your pharmacy.   Signed, , PA-C  02/09/2021 8:19 PM    [facility] [address] [place], 09/2020 Phone: [phone] Fax: [phone]

## 2021-02-09 NOTE — Patient Instructions (Signed)
Medication Instructions:   No change in medication regimen.   Labwork:  Comprehensive Metabolic panel and TSH in 3 months.   Testing/Procedures:  None.   Follow-Up:  In 3-4 months in Dr. Diona Browner   Any Other Special Instructions Will Be Listed Below (If Applicable).     If you need a refill on your cardiac medications before your next appointment, please call your pharmacy.

## 2021-02-15 DIAGNOSIS — E782 Mixed hyperlipidemia: Secondary | ICD-10-CM | POA: Diagnosis not present

## 2021-02-15 DIAGNOSIS — I130 Hypertensive heart and chronic kidney disease with heart failure and stage 1 through stage 4 chronic kidney disease, or unspecified chronic kidney disease: Secondary | ICD-10-CM | POA: Diagnosis not present

## 2021-02-15 DIAGNOSIS — I4891 Unspecified atrial fibrillation: Secondary | ICD-10-CM | POA: Diagnosis not present

## 2021-02-15 DIAGNOSIS — I509 Heart failure, unspecified: Secondary | ICD-10-CM | POA: Diagnosis not present

## 2021-02-15 DIAGNOSIS — R54 Age-related physical debility: Secondary | ICD-10-CM | POA: Diagnosis not present

## 2021-02-15 DIAGNOSIS — N189 Chronic kidney disease, unspecified: Secondary | ICD-10-CM | POA: Diagnosis not present

## 2021-02-20 DIAGNOSIS — Z0000 Encounter for general adult medical examination without abnormal findings: Secondary | ICD-10-CM | POA: Diagnosis not present

## 2021-02-21 ENCOUNTER — Ambulatory Visit (INDEPENDENT_AMBULATORY_CARE_PROVIDER_SITE_OTHER): Payer: PPO | Admitting: Gastroenterology

## 2021-02-21 ENCOUNTER — Other Ambulatory Visit (INDEPENDENT_AMBULATORY_CARE_PROVIDER_SITE_OTHER): Payer: Self-pay

## 2021-02-21 ENCOUNTER — Encounter (INDEPENDENT_AMBULATORY_CARE_PROVIDER_SITE_OTHER): Payer: Self-pay

## 2021-02-21 ENCOUNTER — Other Ambulatory Visit: Payer: Self-pay

## 2021-02-21 ENCOUNTER — Encounter (INDEPENDENT_AMBULATORY_CARE_PROVIDER_SITE_OTHER): Payer: Self-pay | Admitting: Gastroenterology

## 2021-02-21 VITALS — BP 141/83 | HR 73 | Temp 97.7°F | Ht 62.0 in | Wt 131.0 lb

## 2021-02-21 DIAGNOSIS — R1310 Dysphagia, unspecified: Secondary | ICD-10-CM

## 2021-02-21 DIAGNOSIS — R0989 Other specified symptoms and signs involving the circulatory and respiratory systems: Secondary | ICD-10-CM | POA: Diagnosis not present

## 2021-02-21 MED ORDER — ESOMEPRAZOLE MAGNESIUM 20 MG PO CPDR: 20.0000 mg | DELAYED_RELEASE_CAPSULE | Freq: Every day | ORAL | 3 refills | Status: DC

## 2021-02-21 NOTE — Patient Instructions (Signed)
I have sent esomeprazole 20mg  to your pharmacy, please take this 30 minutes prior to eating in the morning. This is for acid reflux, as we discussed, I feel that you are having some silent reflux, causing the phlegm in your throat. This medication will help to reduce the acid and hopefully help with the feeling of the phlegm.  We will get you scheduled for EGD.   Please continue to chew thoroughly, taking small bites, with sips of liquid in between. Stay upright 2-3 hours after eating, prior to lying down. Avoid greasy, heavy, spicy foods late in the day.  Follow up 3 months

## 2021-02-21 NOTE — Progress Notes (Signed)
Referring Provider: Benita StabileHall, John Z, MD Primary Care Physician:  Benita StabileHall, John Z, MD Primary GI Physician: Rehman  Chief Complaint  Patient presents with   Dysphagia    Patient here today with complaints of dysphagia, she states ongoing for a couple of years. No other issues/concerns today.   HPI:   [name] is a 87 y.o. female with past medical history of anemia, CHF, A fib, HTN, thrombocytopenia, renal calculi AAA.   Patient presenting today for dysphagia/thick feeling in her throat.  Patient reports that for the past few years, she feels like she has phlegm/thickness in her throat all the time, states that this causes her to have trouble swallowing, at times she feels like she is going to choke. During the night sometimes the phlegm in her throat feels as though it may cause her to choke. She reports that she sometimes has trouble swallowing her pills and thicker, dryer foods. She had an issue with sweet potatoes where she felt like she was going to get choked while eating them. She denies any issues with heartburn or feelings of acid regurgitation. She denies cough, sore throat or hoarse voice. She is not currently on any PPI or H2B for GERD. She reports that she does not ever have to throw food back up, as it will usually go down with drinking liquids. She recently did some swallowing therapy with [facility], however, she states they did not tell her anything new she did not already know. She has no issues with her appetite or weight loss. Her bowels move regularly and she has no constipation or diarrhea. She denies nausea or vomiting.  Last Colonoscopy:2014 Normal colonoscopy except few diverticula at sigmoid colon. Last Endoscopy:2014 Erosive reflux esophagitis with stricture at GE junction which was dilated with a balloon dilator to 18 mm. Moderate size sliding hiatal hernia. Small ulcer at gastric body along with antral erosions.  Past Medical History:  Diagnosis Date   Anemia     Ascending aortic aneurysm    a. 4.6cm by CT 07/2016.   Chronic diastolic CHF (congestive heart failure) (HCC)    Coronary artery calcification seen on CT scan 07/2016   Dysrhythmia    a fib   History of blood transfusion    History of kidney stones    Hypertension    Hypomagnesemia    PAF (paroxysmal atrial fibrillation) (HCC)    a. dx during adm 08/2016.   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   Negative Jak2, BCR/ABL, normal epo level on 02/01/2014   Sepsis (HCC) 08/2016   Thrombocytopenia (HCC)    Trigeminal neuralgia     Past Surgical History:  Procedure Laterality Date   BREAST CYST EXCISION  60 yrs ago   COLONOSCOPY WITH ESOPHAGOGASTRODUODENOSCOPY (EGD) N/A 03/10/2013   Procedure: COLONOSCOPY WITH ESOPHAGOGASTRODUODENOSCOPY (EGD);  Surgeon: Malissa HippoNajeeb U Rehman, MD;  [facility];  Service: Endoscopy;  Laterality: N/A;  730   CYSTOSCOPY W/ URETERAL STENT PLACEMENT Right 01/02/2021   Procedure: CYSTOSCOPY WITH RETROGRADE PYELOGRAM/URETERAL STENT PLACEMENT;  Surgeon: Malen GauzeMcKenzie, Patrick L, MD;  Location: [facility];  Service: Urology;  Laterality: Right;   CYSTOSCOPY WITH RETROGRADE PYELOGRAM, URETEROSCOPY AND STENT PLACEMENT Right 01/19/2021   Procedure: CYSTOSCOPY WITH RETROGRADE PYELOGRAM, URETEROSCOPY AND STENT EXCHANGE;  Surgeon: Malen GauzeMcKenzie, Patrick L, MD;  Location: [facility];  Service: Urology;  Laterality: Right;   EXTRACORPOREAL SHOCK WAVE LITHOTRIPSY Right 09/20/2016   Procedure: RIGHT EXTRACORPOREAL SHOCK WAVE LITHOTRIPSY (ESWL);  Surgeon: Alfredo MartinezMacDiarmid, Scott, MD;  [facility];  Service: Urology;  Laterality: Right;   EXTRACORPOREAL SHOCK WAVE LITHOTRIPSY Right 02/16/2019   Procedure: EXTRACORPOREAL SHOCK WAVE LITHOTRIPSY (ESWL);  Surgeon: Marcine Matar, MD;  [facility];  Service: Urology;  Laterality: Right;   EXTRACORPOREAL SHOCK WAVE LITHOTRIPSY Right 04/27/2019   Procedure: EXTRACORPOREAL SHOCK WAVE LITHOTRIPSY (ESWL);  Surgeon: Marcine Matar, MD;  [facility];  Service: Urology;  Laterality: Right;   EYE SURGERY     cataract surgery bilat    Gamma Knife     Trigeminal    HOLMIUM LASER APPLICATION Right 01/19/2021   Procedure: HOLMIUM LASER APPLICATION;  Surgeon: Malen Gauze, MD;  Location: [facility];  Service: Urology;  Laterality: Right;   SLT LASER APPLICATION Left 09/27/2014   Procedure: SLT LASER APPLICATION;  Surgeon: Susa Simmonds, MD;  Location: [facility];  Service: Ophthalmology;  Laterality: Left;    Current Outpatient Medications  Medication Sig Dispense Refill   amiodarone (PACERONE) 200 MG tablet Take 1 tablet (200 mg total) by mouth daily. 90 tablet 3   Biotin 1000 MCG tablet Take 1,000 mcg by mouth daily.     Calcium Carbonate-Vitamin D 600-400 MG-UNIT tablet Take 1 tablet by mouth daily.     Cinnamon 500 MG capsule Take 1,000 mg by mouth every other day.     Coenzyme Q10 (COQ10) 100 MG CAPS Take 200 mg by mouth daily.      ELIQUIS 5 MG TABS tablet TAKE (1) TABLET BY MOUTH TWICE DAILY. 180 tablet 0   ferrous sulfate 325 (65 FE) MG tablet Take 325 mg by mouth daily.      furosemide (LASIX) 20 MG tablet Take 1 tablet (20 mg total) by mouth daily as needed. (Patient taking differently: Take 40 mg by mouth daily.) 90 tablet 3   gabapentin (NEURONTIN) 300 MG capsule Take 300 mg by mouth every other day.     metoprolol succinate (TOPROL-XL) 25 MG 24 hr tablet Take 1 tablet (25 mg total) by mouth in the morning and at bedtime. 60 tablet 2   potassium chloride SA (KLOR-CON) 20 MEQ tablet Take 1 tablet (20 mEq total) by mouth daily. 30 tablet 1   pravastatin (PRAVACHOL) 40 MG tablet Take 40 mg by mouth at bedtime.      Specialty Vitamins Products (BIOTIN PLUS KERATIN PO) Take 250 mg by mouth.     esomeprazole (NEXIUM) 40 MG capsule Take 40 mg by mouth daily.  (Patient not taking: Reported on 02/21/2021)     Multiple Vitamin (MULTIVITAMIN WITH MINERALS) TABS tablet Take 1 tablet by mouth daily. (Patient not taking: Reported on  02/21/2021)     No current facility-administered medications for this visit.    Allergies as of 02/21/2021 - Review Complete 02/21/2021  Allergen Reaction Noted   Sulfa antibiotics Other (See Comments) 02/20/2013    Family History  Problem Relation Age of Onset   Congestive Heart Failure Mother    Heart attack Father    CAD Neg Hx     Social History   Socioeconomic History   Marital status: Widowed    Spouse name: Not on file   Number of children: Not on file   Years of education: Not on file   Highest education level: Not on file  Occupational History   Not on file  Tobacco Use   Smoking status: Never   Smokeless tobacco: Never  Vaping Use   Vaping Use: Never used  Substance and Sexual Activity   Alcohol use: No  Drug use: No   Sexual activity: Never  Other Topics Concern   Not on file  Social History Narrative   Not on file   Social Determinants of Health   Financial Resource Strain: Not on file  Food Insecurity: Not on file  Transportation Needs: Not on file  Physical Activity: Not on file  Stress: Not on file  Social Connections: Not on file    Review of systems General: negative for malaise, night sweats, fever, chills, weight los Neck: Negative for lumps, goiter, pain and significant neck swelling Resp: Negative for cough, wheezing, dyspnea at rest CV: Negative for chest pain, leg swelling, palpitations, orthopnea GI: denies melena, hematochezia, nausea, vomiting, diarrhea, constipation, odyonophagia, early satiety or unintentional weight loss. +dysphagia MSK: Negative for joint pain or swelling, back pain, and muscle pain. Derm: Negative for itching or rash Psych: Denies depression, anxiety, memory loss, confusion. No homicidal or suicidal ideation.  Heme: Negative for prolonged bleeding, bruising easily, and swollen nodes. Endocrine: Negative for cold or heat intolerance, polyuria, polydipsia and goiter. Neuro: negative for tremor, gait imbalance,  syncope and seizures. The remainder of the review of systems is noncontributory.  Physical Exam: BP (!) 141/83 (BP Location: Left Arm, Patient Position: Sitting, Cuff Size: Small)   Pulse 73   Temp 97.7 F (36.5 C) (Oral)   Ht 5\' 2"  (1.575 m)   Wt 131 lb (59.4 kg)   BMI 23.96 kg/m  General:   Alert and oriented. No distress noted. Pleasant and cooperative.  Head:  Normocephalic and atraumatic. Eyes:  Conjuctiva clear without scleral icterus. Mouth:  Oral mucosa pink and moist. Good dentition. No lesions. Heart: Normal rate and rhythm, s1 and s2 heart sounds present.  Lungs: Clear lung sounds in all lobes. Respirations equal and unlabored. Abdomen:  +BS, soft, non-tender and non-distended. No rebound or guarding. No HSM or masses noted. Derm: No palmar erythema or jaundice Msk:  Symmetrical without gross deformities. Normal posture. Extremities:  Without edema. Neurologic:  Alert and  oriented x4 Psych:  Alert and cooperative. Normal mood and affect.  Invalid input(s): 6 MONTHS   ASSESSMENT: [name] is a 87 y.o. female presenting today for dysphagia.  Dysphagia present for the past few years, mostly due to feeling of phlegm/thickness in throat. I suspect patient is experiencing silent reflux as she denies any issues with heartburn or acid regurgitation, however, it is likely she could also have a recurring esophageal stricture as she had one in 2014 s/p dilation. She has no nausea or vomiting.  Given mildness of her symptoms, We discussed conservative management with trialing PPI for GERD therapy first and proceeding with EGD +/-dilation thereafter, if PPI did not improve symptoms, however, patient prefers to go ahead with EGD.   We will start on esomeprazole 20mg  once daily for GERD, in hopes that this will help with the thick feeling in her throat. She was advised to continue chewing precautions. We will schedule her for EGD.  No red flag symptoms. Patient denies melena,  hematochezia, nausea, vomiting, diarrhea, constipation, dysphagia, odyonophagia, early satiety or weight loss.    PLAN:  esomeprazole 20mg  once daily, 30 minutes before breakfast 2. EGD +/- dilation 3. Chewing precautions 4. Stay upright 2-3 hours after eating 5. Avoid greasy, spicy, heavy foods late in the day    Follow Up: 3 months  Favor Kreh L. Alver Sorrow, MSN, APRN, AGNP-C Adult-Gerontology Nurse Practitioner [facility]

## 2021-02-22 ENCOUNTER — Encounter (INDEPENDENT_AMBULATORY_CARE_PROVIDER_SITE_OTHER): Payer: Self-pay

## 2021-03-02 NOTE — Patient Instructions (Addendum)
20    Your procedure is scheduled on: 03/08/2021  Report to [facility] Main Entrance at    8:00 AM.  Call this number if you have problems the morning of surgery: [phone]   Remember:   Follow instructions on letter from office regarding when to stop eating and drinking        No Smoking the day of procedure  Hold Eliquis 2 days prior to procedure per letter      Take these medicines the morning of surgery with A SIP OF WATER: Amiodarone, Esomeprazole, gabapentin, and metoprolol   Do not wear jewelry, make-up or nail polish.  Do not wear lotions, powders, or perfumes. You may wear deodorant.                Do not bring valuables to the hospital.  Contacts, dentures or bridgework may not be worn into surgery.  Leave suitcase in the car. After surgery it may be brought to your room.  For patients admitted to the hospital, checkout time is 11:00 AM the day of discharge.   Patients discharged the day of surgery will not be allowed to drive home. Upper Endoscopy, Adult Upper endoscopy is a procedure to look inside the upper GI (gastrointestinal) tract. The upper GI tract is made up of: The part of the body that moves food from your mouth to your stomach (esophagus). The stomach. The first part of your small intestine (duodenum). This procedure is also called esophagogastroduodenoscopy (EGD) or gastroscopy. In this procedure, your health care provider passes a thin, flexible tube (endoscope) through your mouth and down your esophagus into your stomach. A small camera is attached to the end of the tube. Images from the camera appear on a monitor in the exam room. During this procedure, your health care provider may also remove a small piece of tissue to be sent to a lab and examined under a microscope (biopsy). Your health care provider may do an upper endoscopy to diagnose cancers of the upper GI tract. You may also have this procedure to find the cause of other conditions, such as: Stomach  pain. Heartburn. Pain or problems when swallowing. Nausea and vomiting. Stomach bleeding. Stomach ulcers. Tell a health care provider about: Any allergies you have. All medicines you are taking, including vitamins, herbs, eye drops, creams, and over-the-counter medicines. Any problems you or family members have had with anesthetic medicines. Any blood disorders you have. Any surgeries you have had. Any medical conditions you have. Whether you are pregnant or may be pregnant. What are the risks? Generally, this is a safe procedure. However, problems may occur, including: Infection. Bleeding. Allergic reactions to medicines. A tear or hole (perforation) in the esophagus, stomach, or duodenum. What happens before the procedure? Staying hydrated Follow instructions from your health care provider about hydration, which may include: Up to 4 hours before the procedure - you may continue to drink clear liquids, such as water, clear fruit juice, black coffee, and plain tea.   Medicines Ask your health care provider about: Changing or stopping your regular medicines. This is especially important if you are taking diabetes medicines or blood thinners. Taking medicines such as aspirin and ibuprofen. These medicines can thin your blood. Do not take these medicines unless your health care provider tells you to take them. Taking over-the-counter medicines, vitamins, herbs, and supplements. General instructions Plan to have someone take you home from the hospital or clinic. If you will be going home right after the  procedure, plan to have someone with you for 24 hours. Ask your health care provider what steps will be taken to help prevent infection. What happens during the procedure?  An IV will be inserted into one of your veins. You may be given one or more of the following: A medicine to help you relax (sedative). A medicine to numb the throat (local anesthetic). You will lie on your left  side on an exam table. Your health care provider will pass the endoscope through your mouth and down your esophagus. Your health care provider will use the scope to check the inside of your esophagus, stomach, and duodenum. Biopsies may be taken. The endoscope will be removed. The procedure may vary among health care providers and hospitals. What happens after the procedure? Your blood pressure, heart rate, breathing rate, and blood oxygen level will be monitored until you leave the hospital or clinic. Do not drive for 24 hours if you were given a sedative during your procedure. When your throat is no longer numb, you may be given some fluids to drink. It is up to you to get the results of your procedure. Ask your health care provider, or the department that is doing the procedure, when your results will be ready. Summary Upper endoscopy is a procedure to look inside the upper GI tract. During the procedure, an IV will be inserted into one of your veins. You may be given a medicine to help you relax. A medicine will be used to numb your throat. The endoscope will be passed through your mouth and down your esophagus. This information is not intended to replace advice given to you by your health care provider. Make sure you discuss any questions you have with your health care provider. Document Revised: 10/02/2017 Document Reviewed: 09/09/2017 Elsevier Patient Education  [place] After  Please read the instructions outlined below and refer to this sheet in the next few weeks. These discharge instructions provide you with general information on caring for yourself after you leave the hospital. Your doctor may also give you specific instructions. While your treatment has been planned according to the most current medical practices available,  unavoidable complications occasionally occur. If you have any problems or questions after discharge, please call your doctor. HOME CARE INSTRUCTIONS Activity You may resume your regular activity but move at a slower pace for the next 24 hours.  Take frequent rest periods for the next 24 hours.  Walking will help expel (get rid of) the air and reduce the bloated feeling in your abdomen.  No driving for 24 hours (because of the anesthesia (medicine) used during the test).  You may shower.  Do not sign any important legal documents  or operate any machinery for 24 hours (because of the anesthesia used during the test).  Nutrition Drink plenty of fluids.  You may resume your normal diet.  Begin with a light meal and progress to your normal diet.  Avoid alcoholic beverages for 24 hours or as instructed by your caregiver.  Medications You may resume your normal medications unless your caregiver tells you otherwise. What you can expect today You may experience abdominal discomfort such as a feeling of fullness or "gas" pains.  You may experience a sore throat for 2 to 3 days. This is normal. Gargling with salt water may help this.  Follow-up Your doctor will discuss the results of your test with you. SEEK IMMEDIATE MEDICAL CARE IF: You have excessive nausea (feeling sick to your stomach) and/or vomiting.  You have severe abdominal pain and distention (swelling).  You have trouble swallowing.  You have a temperature over 100 F (37.8 C).  You have rectal bleeding or vomiting of blood.  Document Released: 11/22/2003 Document Revised: 03/29/2011 Document Reviewed: 06/04/2007  Esophageal Dilatation Esophageal dilatation, also called esophageal dilation, is a procedure to widen or open a blocked or narrowed part of the esophagus. The esophagus is the part of the body that moves food and liquid from the mouth to the stomach. You may need this procedure if: You have a buildup of scar tissue in your  esophagus that makes it difficult, painful, or impossible to swallow. This can be caused by gastroesophageal reflux disease (GERD). You have cancer of the esophagus. There is a problem with how food moves through your esophagus. In some cases, you may need this procedure repeated at a later time to dilate the esophagus gradually. Tell a health care provider about: Any allergies you have. All medicines you are taking, including vitamins, herbs, eye drops, creams, and over-the-counter medicines. Any problems you or family members have had with anesthetic medicines. Any blood disorders you have. Any surgeries you have had. Any medical conditions you have. Any antibiotic medicines you are required to take before dental procedures. Whether you are pregnant or may be pregnant. What are the risks? Generally, this is a safe procedure. However, problems may occur, including: Bleeding due to a tear in the lining of the esophagus. A hole, or perforation, in the esophagus. What happens before the procedure? Ask your health care provider about: Changing or stopping your regular medicines. This is especially important if you are taking diabetes medicines or blood thinners. Taking medicines such as aspirin and ibuprofen. These medicines can thin your blood. Do not take these medicines unless your health care provider tells you to take them. Taking over-the-counter medicines, vitamins, herbs, and supplements. Follow instructions from your health care provider about eating or drinking restrictions. Plan to have a responsible adult take you home from the hospital or clinic. Plan to have a responsible adult care for you for the time you are told after you leave the hospital or clinic. This is important. What happens during the procedure? You may be given a medicine to help you relax (sedative). A numbing medicine may be sprayed into the back of your throat, or you may gargle the medicine. Your health care  provider may perform the dilatation using various surgical instruments, such as: Simple dilators. This instrument is carefully placed in the esophagus to stretch it. Guided wire bougies. This involves using an endoscope to insert a wire into the esophagus. A dilator is passed over this wire to enlarge the esophagus. Then  the wire is removed. Balloon dilators. An endoscope with a small balloon is inserted into the esophagus. The balloon is inflated to stretch the esophagus and open it up. The procedure may vary among health care providers and hospitals. What can I expect after the procedure? Your blood pressure, heart rate, breathing rate, and blood oxygen level will be monitored until you leave the hospital or clinic. Your throat may feel slightly sore and numb. This will get better over time. You will not be allowed to eat or drink until your throat is no longer numb. When you are able to drink, urinate, and sit on the edge of the bed without nausea or dizziness, you may be able to return home. Follow these instructions at home: Take over-the-counter and prescription medicines only as told by your health care provider. If you were given a sedative during the procedure, it can affect you for several hours. Do not drive or operate machinery until your health care provider says that it is safe. Plan to have a responsible adult care for you for the time you are told. This is important. Follow instructions from your health care provider about any eating or drinking restrictions. Do not use any products that contain nicotine or tobacco, such as cigarettes, e-cigarettes, and chewing tobacco. If you need help quitting, ask your health care provider. Keep all follow-up visits. This is important. Contact a health care provider if: You have a fever. You have pain that is not relieved by medicine. Get help right away if: You have chest pain. You have trouble breathing. You have trouble swallowing. You  vomit blood. You have black, tarry, or bloody stools. These symptoms may represent a serious problem that is an emergency. Do not wait to see if the symptoms will go away. Get medical help right away. Call your local emergency services (911 in the [place] Do not drive yourself to the hospital. Summary Esophageal dilatation, also called esophageal dilation, is a procedure to widen or open a blocked or narrowed part of the esophagus. Plan to have a responsible adult take you home from the hospital or clinic. For this procedure, a numbing medicine may be sprayed into the back of your throat, or you may gargle the medicine. Do not drive or operate machinery until your health care provider says that it is safe. This information is not intended to replace advice given to you by your health care provider. Make sure you discuss any questions you have with your health care provider. Document Revised: 08/26/2019 Document Reviewed: 08/26/2019 Elsevier Patient Education  [place].

## 2021-03-06 ENCOUNTER — Other Ambulatory Visit: Payer: Self-pay

## 2021-03-06 ENCOUNTER — Encounter (HOSPITAL_COMMUNITY)
Admission: RE | Admit: 2021-03-06 | Discharge: 2021-03-06 | Disposition: A | Discharge status: Home / Self Care | Payer: PPO | Source: Ambulatory Visit | Attending: Internal Medicine | Admitting: Internal Medicine

## 2021-03-06 VITALS — BP 155/87 | HR 62 | Temp 97.8°F | Resp 18 | Ht 62.0 in | Wt 130.0 lb

## 2021-03-06 DIAGNOSIS — Z01812 Encounter for preprocedural laboratory examination: Secondary | ICD-10-CM | POA: Diagnosis not present

## 2021-03-06 DIAGNOSIS — Z79899 Other long term (current) drug therapy: Secondary | ICD-10-CM | POA: Diagnosis not present

## 2021-03-06 LAB — BASIC METABOLIC PANEL
Anion gap: 6 (ref 5–15)
BUN: 14 mg/dL (ref 8–23)
CO2: 31 mmol/L (ref 22–32)
Calcium: 9.3 mg/dL (ref 8.9–10.3)
Chloride: 100 mmol/L (ref 98–111)
Creatinine, Ser: 0.89 mg/dL (ref 0.44–1.00)
GFR, Estimated: >60 mL/min (ref >60)
Glucose, Bld: 83 mg/dL (ref 70–99)
Potassium: 4.7 mmol/L (ref 3.5–5.1)
Sodium: 137 mmol/L (ref 135–145)

## 2021-03-07 DIAGNOSIS — H18519 Endothelial corneal dystrophy, unspecified eye: Secondary | ICD-10-CM | POA: Diagnosis not present

## 2021-03-07 DIAGNOSIS — H5213 Myopia, bilateral: Secondary | ICD-10-CM | POA: Diagnosis not present

## 2021-03-07 DIAGNOSIS — H52203 Unspecified astigmatism, bilateral: Secondary | ICD-10-CM | POA: Diagnosis not present

## 2021-03-07 DIAGNOSIS — H16221 Keratoconjunctivitis sicca, not specified as Sjogren's, right eye: Secondary | ICD-10-CM | POA: Diagnosis not present

## 2021-03-07 DIAGNOSIS — H524 Presbyopia: Secondary | ICD-10-CM | POA: Diagnosis not present

## 2021-03-07 DIAGNOSIS — Z961 Presence of intraocular lens: Secondary | ICD-10-CM | POA: Diagnosis not present

## 2021-03-07 DIAGNOSIS — H35371 Puckering of macula, right eye: Secondary | ICD-10-CM | POA: Diagnosis not present

## 2021-03-07 DIAGNOSIS — H40013 Open angle with borderline findings, low risk, bilateral: Secondary | ICD-10-CM | POA: Diagnosis not present

## 2021-03-08 ENCOUNTER — Ambulatory Visit (HOSPITAL_COMMUNITY): Payer: PPO | Admitting: Anesthesiology

## 2021-03-08 ENCOUNTER — Ambulatory Visit (HOSPITAL_COMMUNITY)
Admission: RE | Admit: 2021-03-08 | Discharge: 2021-03-08 | Disposition: A | Discharge status: Home / Self Care | Payer: PPO | Attending: Internal Medicine | Admitting: Internal Medicine

## 2021-03-08 ENCOUNTER — Encounter (HOSPITAL_COMMUNITY)
Admission: RE | Disposition: A | Discharge status: Home / Self Care | Payer: Self-pay | Source: Home / Self Care | Attending: Internal Medicine

## 2021-03-08 ENCOUNTER — Encounter (HOSPITAL_COMMUNITY): Payer: Self-pay | Admitting: Internal Medicine

## 2021-03-08 DIAGNOSIS — R1314 Dysphagia, pharyngoesophageal phase: Secondary | ICD-10-CM | POA: Insufficient documentation

## 2021-03-08 DIAGNOSIS — I5032 Chronic diastolic (congestive) heart failure: Secondary | ICD-10-CM | POA: Insufficient documentation

## 2021-03-08 DIAGNOSIS — I130 Hypertensive heart and chronic kidney disease with heart failure and stage 1 through stage 4 chronic kidney disease, or unspecified chronic kidney disease: Secondary | ICD-10-CM | POA: Insufficient documentation

## 2021-03-08 DIAGNOSIS — D649 Anemia, unspecified: Secondary | ICD-10-CM | POA: Diagnosis not present

## 2021-03-08 DIAGNOSIS — K3189 Other diseases of stomach and duodenum: Secondary | ICD-10-CM | POA: Diagnosis not present

## 2021-03-08 DIAGNOSIS — I2510 Atherosclerotic heart disease of native coronary artery without angina pectoris: Secondary | ICD-10-CM | POA: Diagnosis not present

## 2021-03-08 DIAGNOSIS — Q394 Esophageal web: Secondary | ICD-10-CM | POA: Insufficient documentation

## 2021-03-08 DIAGNOSIS — N189 Chronic kidney disease, unspecified: Secondary | ICD-10-CM | POA: Diagnosis not present

## 2021-03-08 DIAGNOSIS — K219 Gastro-esophageal reflux disease without esophagitis: Secondary | ICD-10-CM | POA: Insufficient documentation

## 2021-03-08 DIAGNOSIS — I4891 Unspecified atrial fibrillation: Secondary | ICD-10-CM | POA: Diagnosis not present

## 2021-03-08 DIAGNOSIS — K222 Esophageal obstruction: Secondary | ICD-10-CM | POA: Insufficient documentation

## 2021-03-08 DIAGNOSIS — R1310 Dysphagia, unspecified: Secondary | ICD-10-CM

## 2021-03-08 DIAGNOSIS — K449 Diaphragmatic hernia without obstruction or gangrene: Secondary | ICD-10-CM | POA: Diagnosis not present

## 2021-03-08 HISTORY — PX: ESOPHAGEAL DILATION: SHX303

## 2021-03-08 HISTORY — PX: ESOPHAGOGASTRODUODENOSCOPY (EGD) WITH PROPOFOL: SHX5813

## 2021-03-08 SURGERY — ESOPHAGOGASTRODUODENOSCOPY (EGD) WITH PROPOFOL
Anesthesia: General

## 2021-03-08 MED ORDER — LIDOCAINE HCL (PF) 2 % IJ SOLN
INTRAMUSCULAR | Status: AC
  Filled 2021-03-08: qty 5

## 2021-03-08 MED ORDER — PROPOFOL 10 MG/ML IV BOLUS
INTRAVENOUS | Status: DC | PRN
  Administered 2021-03-08: 25 mg via INTRAVENOUS
  Administered 2021-03-08: 20 mg via INTRAVENOUS
  Administered 2021-03-08: 50 mg via INTRAVENOUS
  Administered 2021-03-08: 20 mg via INTRAVENOUS
  Administered 2021-03-08: 25 mg via INTRAVENOUS

## 2021-03-08 MED ORDER — PROPOFOL 10 MG/ML IV BOLUS
INTRAVENOUS | Status: AC
  Filled 2021-03-08: qty 60

## 2021-03-08 MED ORDER — EPHEDRINE SULFATE 50 MG/ML IJ SOLN
INTRAMUSCULAR | Status: DC | PRN
  Administered 2021-03-08: 10 mg via INTRAVENOUS

## 2021-03-08 MED ORDER — LACTATED RINGERS IV SOLN: INTRAVENOUS | Status: DC

## 2021-03-08 NOTE — Anesthesia Preprocedure Evaluation (Signed)
Anesthesia Evaluation  Patient identified by MRN, date of birth, ID band Patient awake    Reviewed: Allergy & Precautions, H&P , NPO status , Patient's Chart, lab work & pertinent test results, reviewed documented beta blocker date and time   Airway Mallampati: II  TM Distance: >3 FB Neck ROM: full    Dental no notable dental hx.    Pulmonary neg pulmonary ROS,    Pulmonary exam normal breath sounds clear to auscultation       Cardiovascular Exercise Tolerance: Good hypertension, + CAD and +CHF  negative cardio ROS  + dysrhythmias Atrial Fibrillation  Rhythm:regular Rate:Normal     Neuro/Psych  Neuromuscular disease negative psych ROS   GI/Hepatic negative GI ROS, Neg liver ROS, GERD  Medicated,  Endo/Other  negative endocrine ROS  Renal/GU CRFRenal diseasenegative Renal ROS  negative genitourinary   Musculoskeletal negative musculoskeletal ROS (+)   Abdominal   Peds negative pediatric ROS (+)  Hematology  (+) Blood dyscrasia, anemia ,   Anesthesia Other Findings   Reproductive/Obstetrics negative OB ROS                             Anesthesia Physical  Anesthesia Plan  ASA: 3  Anesthesia Plan: General   Post-op Pain Management:    Induction:   PONV Risk Score and Plan: TIVA and Propofol infusion  Airway Management Planned:   Additional Equipment:   Intra-op Plan:   Post-operative Plan:   Informed Consent: I have reviewed the patients History and Physical, chart, labs and discussed the procedure including the risks, benefits and alternatives for the proposed anesthesia with the patient or authorized representative who has indicated his/her understanding and acceptance.     Dental Advisory Given  Plan Discussed with: CRNA  Anesthesia Plan Comments:         Anesthesia Quick Evaluation

## 2021-03-08 NOTE — Discharge Instructions (Addendum)
Resume Eliquis/apixaban on 03/10/2021 Resume other medications as before. Resume usual diet.  Remember to chew food thoroughly before swallowing. No driving for 24 hours. Please call office with progress report in 1 week.

## 2021-03-08 NOTE — Transfer of Care (Signed)
Immediate Anesthesia Transfer of Care Note  Patient: [name]  Procedure(s) Performed: ESOPHAGOGASTRODUODENOSCOPY (EGD) WITH PROPOFOL ESOPHAGEAL DILATION  Patient Location: Short Stay  Anesthesia Type:General  Level of Consciousness: awake  Airway & Oxygen Therapy: Patient Spontanous Breathing  Post-op Assessment: Report given to RN  Post vital signs: Reviewed  Last Vitals:  Vitals Value Taken Time  BP    Temp    Pulse    Resp    SpO2      Last Pain:  Vitals:   03/08/21 0912  TempSrc:   PainSc: 0-No pain         Complications: No notable events documented.

## 2021-03-08 NOTE — H&P (Signed)
[name] is an 87 y.o. female.   Chief Complaint: Patient is here for esophagogastroduodenoscopy with esophageal dilation. HPI: Patient is a 87 year old Caucasian female who has a history of erosive reflux esophagitis and distal esophageal stricture which was last dilated in November 2014 who presents with 2 to 3-year history of dysphagia to solids.  Lately has been occurring more frequently.  She says her son had to perform Heimlich maneuver 2 or 3 times for food impaction and work.  She denies heartburn.  She denies epigastric pain melena anorexia or weight loss.  She points to suprasternal area as site of bolus obstruction. Last Eliquis dose was on 03/05/2021.  Past Medical History:  Diagnosis Date   Anemia    Ascending aortic aneurysm    a. 4.6cm by CT 07/2016.   Chronic diastolic CHF (congestive heart failure) (HCC)    Coronary artery calcification seen on CT scan 07/2016   Dysrhythmia    a fib   History of blood transfusion    History of kidney stones    Hypertension    Hypomagnesemia    PAF (paroxysmal atrial fibrillation) ([place])    a. dx during adm 08/2016.   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   Negative Jak2, BCR/ABL, normal epo level on 02/01/2014   Sepsis ([place]) 08/2016   Thrombocytopenia ([facility])    Trigeminal neuralgia         GERD  Past Surgical History:  Procedure Laterality Date   BREAST CYST EXCISION  60 yrs ago   COLONOSCOPY WITH ESOPHAGOGASTRODUODENOSCOPY (EGD) N/A 03/10/2013   Raylie Maddison: normal except few diverticula in simoid   CYSTOSCOPY W/ URETERAL STENT PLACEMENT Right 01/02/2021   Procedure: CYSTOSCOPY WITH RETROGRADE PYELOGRAM/URETERAL STENT PLACEMENT;  Surgeon: Cleon Gustin, MD;  Location: [facility];  Service: Urology;  Laterality: Right;   CYSTOSCOPY WITH RETROGRADE PYELOGRAM, URETEROSCOPY AND STENT PLACEMENT Right 01/19/2021   Procedure: CYSTOSCOPY WITH RETROGRADE PYELOGRAM, URETEROSCOPY AND STENT EXCHANGE;  Surgeon: Cleon Gustin, MD;  Location:  [facility];  Service: Urology;  Laterality: Right;   ESOPHAGOGASTRODUODENOSCOPY  2014   Erosive reflux esophagitis with stricture at GE junction which was dilated with a balloon dilator to 18 mm. Moderate size sliding hiatal hernia. small ulcer at gastric body along with antral erosions   EXTRACORPOREAL SHOCK WAVE LITHOTRIPSY Right 09/20/2016   Procedure: RIGHT EXTRACORPOREAL SHOCK WAVE LITHOTRIPSY (ESWL);  Surgeon: Bjorn Loser, MD;  Location: [facility];  Service: Urology;  Laterality: Right;   EXTRACORPOREAL SHOCK WAVE LITHOTRIPSY Right 02/16/2019   Procedure: EXTRACORPOREAL SHOCK WAVE LITHOTRIPSY (ESWL);  Surgeon: Franchot Gallo, MD;  Location: [facility];  Service: Urology;  Laterality: Right;   EXTRACORPOREAL SHOCK WAVE LITHOTRIPSY Right 04/27/2019   Procedure: EXTRACORPOREAL SHOCK WAVE LITHOTRIPSY (ESWL);  Surgeon: Franchot Gallo, MD;  Location: [facility];  Service: Urology;  Laterality: Right;   EYE SURGERY     cataract surgery bilat    Gamma Knife     Trigeminal    HOLMIUM LASER APPLICATION Right [id number]   Procedure: HOLMIUM LASER APPLICATION;  Surgeon: Cleon Gustin, MD;  Location: [facility];  Service: Urology;  Laterality: Right;   SLT LASER APPLICATION Left [id number]   Procedure: SLT LASER APPLICATION;  Surgeon: Williams Che, MD;  Location: [facility];  Service: Ophthalmology;  Laterality: Left;    Family History  Problem Relation Age of Onset   Congestive Heart Failure Mother    Heart attack Father    CAD Neg Hx    Social History:  reports that she  has never smoked. She has never used smokeless tobacco. She reports that she does not drink alcohol and does not use drugs.  Allergies:  Allergies  Allergen Reactions   Sulfa Antibiotics Other (See Comments)    Pt states that this med makes her feel crazy.      Medications Prior to Admission  Medication Sig Dispense Refill   amiodarone (PACERONE) 200 MG tablet Take 1 tablet (200 mg total) by mouth daily. 90 tablet 3    Biotin 1000 MCG tablet Take 1,000 mcg by mouth daily.     Calcium Carbonate-Vitamin D 600-400 MG-UNIT tablet Take 1 tablet by mouth daily.     esomeprazole (NEXIUM) 20 MG capsule Take 1 capsule (20 mg total) by mouth daily before breakfast. 60 capsule 3   furosemide (LASIX) 20 MG tablet Take 1 tablet (20 mg total) by mouth daily as needed. (Patient taking differently: Take 40 mg by mouth daily.) 90 tablet 3   gabapentin (NEURONTIN) 300 MG capsule Take 300 mg by mouth every other day.     metoprolol succinate (TOPROL-XL) 25 MG 24 hr tablet Take 1 tablet (25 mg total) by mouth in the morning and at bedtime. 60 tablet 2   pravastatin (PRAVACHOL) 40 MG tablet Take 40 mg by mouth at bedtime.      Cinnamon 500 MG capsule Take 1,000 mg by mouth every other day.     Coenzyme Q10 (COQ10) 100 MG CAPS Take 200 mg by mouth daily.      ELIQUIS 5 MG TABS tablet TAKE (1) TABLET BY MOUTH TWICE DAILY. 180 tablet 0   ferrous sulfate 325 (65 FE) MG tablet Take 325 mg by mouth daily.      Multiple Vitamin (MULTIVITAMIN WITH MINERALS) TABS tablet Take 1 tablet by mouth daily. (Patient not taking: Reported on 02/21/2021)     potassium chloride SA (KLOR-CON) 20 MEQ tablet Take 1 tablet (20 mEq total) by mouth daily. 30 tablet 1   Specialty Vitamins Products (BIOTIN PLUS KERATIN PO) Take 250 mg by mouth.      Results for orders placed or performed during the hospital encounter of 03/06/21 (from the past 48 hour(s))  Basic metabolic panel     Status: None   Collection Time: 03/06/21  3:21 PM  Result Value Ref Range   Sodium 137 135 - 145 mmol/L   Potassium 4.7 3.5 - 5.1 mmol/L   Chloride 100 98 - 111 mmol/L   CO2 31 22 - 32 mmol/L   Glucose, Bld 83 70 - 99 mg/dL    Comment: Glucose reference range applies only to samples taken after fasting for at least 8 hours.   BUN 14 8 - 23 mg/dL   Creatinine, Ser 4.76 0.44 - 1.00 mg/dL   Calcium 9.3 8.9 - 54.6 mg/dL   GFR, Estimated >50 >35 mL/min    Comment:  (NOTE) Calculated using the CKD-EPI Creatinine Equation (2021)    Anion gap 6 5 - 15    Comment: Performed at [facility], [address], [place], [place] [address]   No results found.  Review of Systems  Blood pressure (!) 151/75, pulse 66, temperature 97.7 F (36.5 C), temperature source Oral, resp. rate (!) 22, height 5\' 2"  (1.575 m), weight 58.9 kg, SpO2 98 %. Physical Exam HENT:     Mouth/Throat:     Mouth: Mucous membranes are moist.     Pharynx: Oropharynx is clear.  Eyes:     General: No scleral icterus.  Conjunctiva/sclera: Conjunctivae normal.  Cardiovascular:     Rate and Rhythm: Normal rate and regular rhythm.     Heart sounds: Normal heart sounds. No murmur heard. Pulmonary:     Effort: Pulmonary effort is normal.     Breath sounds: Normal breath sounds.  Abdominal:     General: There is no distension.     Palpations: Abdomen is soft. There is no mass.     Tenderness: There is no abdominal tenderness.  Musculoskeletal:        General: No swelling.     Cervical back: Neck supple.  Lymphadenopathy:     Cervical: No cervical adenopathy.  Skin:    General: Skin is warm and dry.  Neurological:     Mental Status: She is alert.     Assessment/Plan  Esophageal dysphagia. History of distal esophageal stricture. Esophagogastroduodenoscopy with esophageal dilation.  Hildred Laser, MD 03/08/2021, 9:19 AM

## 2021-03-08 NOTE — Anesthesia Postprocedure Evaluation (Signed)
Anesthesia Post Note  Patient: [name]  Procedure(s) Performed: ESOPHAGOGASTRODUODENOSCOPY (EGD) WITH PROPOFOL ESOPHAGEAL DILATION  Patient location during evaluation: Short Stay Anesthesia Type: General Level of consciousness: awake and alert Pain management: pain level controlled Vital Signs Assessment: post-procedure vital signs reviewed and stable Respiratory status: spontaneous breathing Cardiovascular status: blood pressure returned to baseline and stable Postop Assessment: no apparent nausea or vomiting Anesthetic complications: no   No notable events documented.   Last Vitals:  Vitals:   03/08/21 0834  BP: (!) 151/75  Pulse: 66  Resp: (!) 22  Temp: 36.5 C  SpO2: 98%    Last Pain:  Vitals:   03/08/21 0912  TempSrc:   PainSc: 0-No pain                 Dick Hark

## 2021-03-08 NOTE — Op Note (Signed)
[facility] Patient Name: [name] Procedure Date: 03/08/2021 9:13 AM MRN: [id number] Date of Birth: 1929/03/30 Attending MD: Hildred Laser , MD CSN: [id number] Age: 87 Admit Type: Outpatient Procedure:                Upper GI endoscopy Indications:              Esophageal dysphagia Providers:                Hildred Laser, MD, Charlsie Quest. Theda Sers RN, RN,                            Raphael Gibney, Technician Referring MD:             Delphina Cahill, MD Medicines:                Propofol per Anesthesia Complications:            No immediate complications. Estimated Blood Loss:     Estimated blood loss was minimal. Procedure:                Pre-Anesthesia Assessment:                           - Prior to the procedure, a History and Physical                            was performed, and patient medications and                            allergies were reviewed. The patient's tolerance of                            previous anesthesia was also reviewed. The risks                            and benefits of the procedure and the sedation                            options and risks were discussed with the patient.                            All questions were answered, and informed consent                            was obtained. Prior Anticoagulants: The patient                            last took Eliquis (apixaban) 3 days prior to the                            procedure. ASA Grade Assessment: III - A patient                            with severe systemic disease. After reviewing the  risks and benefits, the patient was deemed in                            satisfactory condition to undergo the procedure.                           After obtaining informed consent, the endoscope was                            passed under direct vision. Throughout the                            procedure, the patient's blood pressure, pulse, and                             oxygen saturations were monitored continuously. The                            GIF-H190 ([id number]) scope was introduced through the                            mouth, and advanced to the second part of duodenum.                            The upper GI endoscopy was accomplished without                            difficulty. The patient tolerated the procedure                            well. Scope In: 9:26:38 AM Scope Out: 9:38:18 AM Total Procedure Duration: 0 hours 11 minutes 40 seconds  Findings:      The hypopharynx was normal.      A web was found in the proximal esophagus.      One benign-appearing, intrinsic mild stenosis was found 34 cm from the       incisors. esophageal dilation was performed by passing 54 [place] Maloney       dilator to 5045 cm from the incisors but not completely. Superficial       mucosal disruption noted at the proximal esophagus. Stricture at GE       junction was dilated with balloon dilator from a diameter of 15 to-16.5       and finally to 18 mm. The stenosis was traversed. The dilation site was       examined following endoscope reinsertion and showed mild mucosal       disruption, mild improvement in luminal narrowing and no perforation.      A 4 cm hiatal hernia was present.      Patchy mildly erythematous mucosa without bleeding was found in the       gastric antrum.      The exam of the stomach was otherwise normal.      The duodenal bulb and second portion of the duodenum were normal. Impression:               - Normal hypopharynx.                           -  Web in the proximal esophagus. It was disrupted                            by passing 91 [place] Maloney dilator resulting in                            superficial mucosal disruption. dilator however                            could not be passed to full length.                           - Benign-appearing esophageal stenosis At the GE                            junction was dilated with  balloon dilator from 15                            to 18 mm.                           - 4 cm hiatal hernia.                           - Erythematous mucosa in the antrum.                           - Normal duodenal bulb and second portion of the                            duodenum.                           - No specimens collected. Moderate Sedation:      Per Anesthesia Care Recommendation:           - Patient has a contact number available for                            emergencies. The signs and symptoms of potential                            delayed complications were discussed with the                            patient. Return to normal activities tomorrow.                            Written discharge instructions were provided to the                            patient.                           - Resume previous diet today.                           -  Continue present medications.                           - Resume Eliquis (apixaban) at prior dose in 2 days.                           - Telephone GI clinic in 1 week. Procedure Code(s):        --- Professional ---                           [phone], Esophagogastroduodenoscopy, flexible,                            transoral; diagnostic, including collection of                            specimen(s) by brushing or washing, when performed                            (separate procedure) Diagnosis Code(s):        --- Professional ---                           Q39.4, Esophageal web                           K22.2, Esophageal obstruction                           K44.9, Diaphragmatic hernia without obstruction or                            gangrene                           K31.89, Other diseases of stomach and duodenum                           R13.14, Dysphagia, pharyngoesophageal phase CPT copyright 2019 [employer]. All rights reserved. The codes documented in this report are preliminary and upon coder review may   be revised to meet current compliance requirements. Hildred Laser, MD Hildred Laser, MD 03/08/2021 9:52:30 AM This report has been signed electronically. Number of Addenda: 0

## 2021-03-09 DIAGNOSIS — K219 Gastro-esophageal reflux disease without esophagitis: Secondary | ICD-10-CM | POA: Diagnosis not present

## 2021-03-09 DIAGNOSIS — R42 Dizziness and giddiness: Secondary | ICD-10-CM | POA: Diagnosis not present

## 2021-03-09 DIAGNOSIS — K222 Esophageal obstruction: Secondary | ICD-10-CM | POA: Diagnosis not present

## 2021-03-09 DIAGNOSIS — R0989 Other specified symptoms and signs involving the circulatory and respiratory systems: Secondary | ICD-10-CM | POA: Diagnosis not present

## 2021-03-10 ENCOUNTER — Encounter (HOSPITAL_COMMUNITY): Payer: Self-pay | Admitting: Internal Medicine

## 2021-03-13 ENCOUNTER — Ambulatory Visit (HOSPITAL_COMMUNITY)
Admission: RE | Admit: 2021-03-13 | Discharge: 2021-03-13 | Disposition: A | Discharge status: Home / Self Care | Payer: PPO | Source: Ambulatory Visit | Attending: Urology | Admitting: Urology

## 2021-03-13 ENCOUNTER — Other Ambulatory Visit: Payer: Self-pay

## 2021-03-13 DIAGNOSIS — N281 Cyst of kidney, acquired: Secondary | ICD-10-CM | POA: Insufficient documentation

## 2021-03-13 DIAGNOSIS — Z87442 Personal history of urinary calculi: Secondary | ICD-10-CM

## 2021-03-13 DIAGNOSIS — N200 Calculus of kidney: Secondary | ICD-10-CM | POA: Diagnosis not present

## 2021-03-15 ENCOUNTER — Telehealth (INDEPENDENT_AMBULATORY_CARE_PROVIDER_SITE_OTHER): Payer: Self-pay | Admitting: *Deleted

## 2021-03-15 NOTE — Telephone Encounter (Signed)
Patient called with PR - states she is doing fine since you stretched her esophagus last week

## 2021-03-17 DIAGNOSIS — I110 Hypertensive heart disease with heart failure: Secondary | ICD-10-CM | POA: Diagnosis not present

## 2021-03-17 DIAGNOSIS — N189 Chronic kidney disease, unspecified: Secondary | ICD-10-CM | POA: Diagnosis not present

## 2021-03-17 DIAGNOSIS — I10 Essential (primary) hypertension: Secondary | ICD-10-CM | POA: Diagnosis not present

## 2021-03-17 DIAGNOSIS — I4891 Unspecified atrial fibrillation: Secondary | ICD-10-CM | POA: Diagnosis not present

## 2021-03-17 DIAGNOSIS — I129 Hypertensive chronic kidney disease with stage 1 through stage 4 chronic kidney disease, or unspecified chronic kidney disease: Secondary | ICD-10-CM | POA: Diagnosis not present

## 2021-03-17 DIAGNOSIS — E782 Mixed hyperlipidemia: Secondary | ICD-10-CM | POA: Diagnosis not present

## 2021-03-17 DIAGNOSIS — R54 Age-related physical debility: Secondary | ICD-10-CM | POA: Diagnosis not present

## 2021-03-17 DIAGNOSIS — I509 Heart failure, unspecified: Secondary | ICD-10-CM | POA: Diagnosis not present

## 2021-03-20 NOTE — Telephone Encounter (Signed)
Glad to hear that patient is able to swallow better

## 2021-03-21 ENCOUNTER — Ambulatory Visit
Admission: RE | Admit: 2021-03-21 | Discharge: 2021-03-21 | Disposition: A | Discharge status: Home / Self Care | Payer: PPO | Source: Ambulatory Visit | Attending: Thoracic Surgery (Cardiothoracic Vascular Surgery) | Admitting: Thoracic Surgery (Cardiothoracic Vascular Surgery)

## 2021-03-21 ENCOUNTER — Other Ambulatory Visit: Payer: Self-pay

## 2021-03-21 ENCOUNTER — Encounter: Payer: Self-pay | Admitting: Thoracic Surgery (Cardiothoracic Vascular Surgery)

## 2021-03-21 ENCOUNTER — Ambulatory Visit: Payer: PPO | Admitting: Thoracic Surgery (Cardiothoracic Vascular Surgery)

## 2021-03-21 VITALS — BP 157/85 | HR 66 | Resp 20 | Ht 62.0 in | Wt 129.0 lb

## 2021-03-21 DIAGNOSIS — I712 Thoracic aortic aneurysm, without rupture, unspecified: Secondary | ICD-10-CM | POA: Diagnosis not present

## 2021-03-21 DIAGNOSIS — I700 Atherosclerosis of aorta: Secondary | ICD-10-CM | POA: Diagnosis not present

## 2021-03-21 DIAGNOSIS — I7121 Aneurysm of the ascending aorta, without rupture: Secondary | ICD-10-CM

## 2021-03-21 MED ORDER — IOPAMIDOL (ISOVUE-370) INJECTION 76%
75.0000 mL | Freq: Once | INTRAVENOUS | Status: AC | PRN
  Administered 2021-03-21: 75 mL via INTRAVENOUS

## 2021-03-21 NOTE — Progress Notes (Signed)
[address]       [place] [address]             [phone]     HPI: [name] returns for follow-up of her ascending aneurysm.  [name] is a 87 year old woman with a history of CAD, chronic diastolic congestive heart failure, polycythemia, hypertension, paroxysmal A. fib, trigeminal neuralgia, thrombocytopenia, nephrolithiasis, and a 5.2 cm ascending thoracic aortic aneurysm.  She was first found to have an aneurysm in 2018.  It was 4.6 cm at that time.  It has grown slowly in the interim.  She has been followed every 6 months.  Most recently it measured 5.2 cm.  We have a long discussion at her last visit regarding surgical repair.  Given her size of really would meet criteria for elective surgery.  I do not think she is a good candidate for that and she is not interested in any surgical intervention.  I advised her to talk to her family about that and she says that she did so and informed them of her wishes.  She says somebody comes to her house house and checks her blood pressure on a regular basis and it is usually within normal limits.  She not having any chest pain, pressure, or tightness.  She was hospitalized with UTI and rapid A. fib back in September.  She had an esophageal dilatation a couple of weeks ago.  Past Medical History:  Diagnosis Date   Anemia    Ascending aortic aneurysm    a. 4.6cm by CT 07/2016.   Chronic diastolic CHF (congestive heart failure) (HCC)    Coronary artery calcification seen on CT scan 07/2016   Dysrhythmia    a fib   History of blood transfusion    History of kidney stones    Hypertension    Hypomagnesemia    PAF (paroxysmal atrial fibrillation) ([place])    a. dx during adm 08/2016.   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   Negative Jak2, BCR/ABL, normal epo level on 02/01/2014   Sepsis ([place]) 08/2016   Thrombocytopenia (HCC)    Trigeminal neuralgia      Current Outpatient Medications  Medication Sig Dispense  Refill   amiodarone (PACERONE) 200 MG tablet Take 1 tablet (200 mg total) by mouth daily. 90 tablet 3   apixaban (ELIQUIS) 5 MG TABS tablet TAKE (1) TABLET BY MOUTH TWICE DAILY. 180 tablet 0   Biotin 1000 MCG tablet Take 1,000 mcg by mouth daily.     Calcium Carbonate-Vitamin D 600-400 MG-UNIT tablet Take 1 tablet by mouth daily.     Cinnamon 500 MG capsule Take 1,000 mg by mouth every other day.     Coenzyme Q10 (COQ10) 100 MG CAPS Take 200 mg by mouth daily.      esomeprazole (NEXIUM) 20 MG capsule Take 1 capsule (20 mg total) by mouth daily before breakfast. 60 capsule 3   ferrous sulfate 325 (65 FE) MG tablet Take 325 mg by mouth daily.      furosemide (LASIX) 20 MG tablet Take 1 tablet (20 mg total) by mouth daily as needed. (Patient taking differently: Take 40 mg by mouth daily.) 90 tablet 3   gabapentin (NEURONTIN) 300 MG capsule Take 300 mg by mouth every other day.     metoprolol succinate (TOPROL-XL) 25 MG 24 hr tablet Take 1 tablet (25 mg total) by mouth in the morning and at bedtime. 60 tablet 2   Multiple Vitamin (MULTIVITAMIN WITH  MINERALS) TABS tablet Take 1 tablet by mouth daily.     potassium chloride SA (KLOR-CON) 20 MEQ tablet Take 1 tablet (20 mEq total) by mouth daily. 30 tablet 1   pravastatin (PRAVACHOL) 40 MG tablet Take 40 mg by mouth at bedtime.      Specialty Vitamins Products (BIOTIN PLUS KERATIN PO) Take 250 mg by mouth.     No current facility-administered medications for this visit.    Physical Exam BP (!) 157/85 (BP Location: Right Arm, Patient Position: Sitting, Cuff Size: Normal)   Pulse 66   Resp 20   Ht 5\' 2"  (1.575 m)   Wt 129 lb (58.5 kg)   SpO2 94% Comment: RA  BMI 23.76 kg/m  87 year old woman in no acute distress, appears frail Alert and oriented x3 with no focal deficits Lungs clear bilaterally Cardiac regular rate and rhythm with a 2/6 diastolic murmur  Diagnostic Tests: CT ANGIOGRAPHY CHEST WITH CONTRAST   TECHNIQUE: Multidetector CT  imaging of the chest was performed using the standard protocol during bolus administration of intravenous contrast. Multiplanar CT image reconstructions and MIPs were obtained to evaluate the vascular anatomy.   CONTRAST:  35mL ISOVUE-370 IOPAMIDOL (ISOVUE-370) INJECTION 76%   COMPARISON:  09/13/2020   FINDINGS: Cardiovascular: Aneurysmal dilatation of the ascending thoracic aorta is again noted. The ascending thoracic aorta measures 5.3 cm in maximum dimension, image 61/6. Formally 5.2 cm. No signs of aortic dissection. Heart size appears within normal limits. No pericardial effusion. Aortic atherosclerosis.   Mediastinum/Nodes: No enlarged mediastinal, hilar, or axillary lymph nodes. Thyroid gland, trachea, and esophagus demonstrate no significant findings.   Lungs/Pleura: No pleural effusion, airspace consolidation, or pneumothorax. Scar within the anterior basal right upper lobe appears unchanged. Similar appearance of scarring within the inferior lingula and posteromedial left lower lobe.   Upper Abdomen: No acute abnormality identified within the imaged portions of the upper abdomen. Moderate size hiatal hernia. Cyst arising off the posterior left kidney measures 2.9 cm. Aortic atherosclerosis identified.   Musculoskeletal: No acute or suspicious osseous findings. Mild thoracolumbar curvature is convex towards the right. Multilevel degenerative disc disease identified.   Review of the MIP images confirms the above findings.   IMPRESSION: 1. Stable appearance of ascending thoracic aortic aneurysm measuring 5.3 cm in maximum dimension. Recommend semi-annual imaging followup by CTA or MRA and referral to cardiothoracic surgery if not already obtained. This recommendation follows 2010 ACCF/AHA/AATS/ACR/ASA/SCA/SCAI/SIR/STS/SVM Guidelines for the Diagnosis and Management of Patients With Thoracic Aortic Disease. Circulation. 2010; 121: E266-e369TAA. Aortic aneurysm  NOS (ICD10-I71.9) 2. Hiatal hernia. 3. Aortic Atherosclerosis (ICD10-I70.0).     Electronically Signed   By: Kerby Moors M.D.   On: 03/21/2021 11:43   I personally reviewed the CT images.  There is a 5.3 cm ascending aneurysm.  Impression: [name] is a 87 year old woman with a history of CAD, chronic diastolic congestive heart failure, polycythemia, hypertension, paroxysmal A. fib, trigeminal neuralgia, thrombocytopenia, nephrolithiasis, and a 5.2 cm ascending thoracic aortic aneurysm.   Ascending aortic aneurysm-extends from her root to her arch.  She has been followed for about 5 years now and the aneurysm has slowly enlarged.  In my opinion she is not a candidate for elective repair nor would she be a candidate for emergent repair in the setting of an aortic dissection.  I did offer to arrange for second opinion for her but she declines and is not interested in pursuing surgery.  She does still want to keep track of the aneurysm.  Since were not considering any surgical intervention we will plan to scan her again in a year.  Hypertension-blood pressure elevated today.  I talked her about this again.  She insists that when her home health nurse comes that her pressure is within normal limits and is only elevated when she is at a doctor's office.  Atrial fibrillation-regular rhythm today.  Plan: Return in 1 year with CT chest  Kaitlyn Slot, MD [facility] [phone]

## 2021-03-22 ENCOUNTER — Encounter: Payer: Self-pay | Admitting: Urology

## 2021-03-22 ENCOUNTER — Ambulatory Visit (INDEPENDENT_AMBULATORY_CARE_PROVIDER_SITE_OTHER): Payer: PPO | Admitting: Urology

## 2021-03-22 VITALS — BP 152/81 | HR 72

## 2021-03-22 DIAGNOSIS — Z87442 Personal history of urinary calculi: Secondary | ICD-10-CM

## 2021-03-22 LAB — URINALYSIS, ROUTINE W REFLEX MICROSCOPIC
Bilirubin, UA: NEGATIVE
Glucose, UA: NEGATIVE
Ketones, UA: NEGATIVE
Nitrite, UA: NEGATIVE
Specific Gravity, UA: 1.015 (ref 1.005–1.030)
Urobilinogen, Ur: 0.2 mg/dL (ref 0.2–1.0)
pH, UA: 5 (ref 5.0–7.5)

## 2021-03-22 LAB — MICROSCOPIC EXAMINATION
Renal Epithel, UA: NONE SEEN /hpf
WBC, UA: >30 /hpf — AB (ref 0–5)

## 2021-03-22 NOTE — Progress Notes (Signed)
03/22/2021 9:55 AM   [name] [birth year] [id number]  Referring provider: Benita Stabile, MD [address] [name],  [place] [address]  Followup nephrolithiasis   HPI: Ms Skarzynski is a 87yo her for followup for nephrolithiasis. Renal US 03/13/2021 shows no calculi and no hydronephrosis. No flank p[ain. No significant LUTS. No other complaints today.    PMH: Past Medical History:  Diagnosis Date   Anemia    Ascending aortic aneurysm    a. 4.6cm by CT 07/2016.   Chronic diastolic CHF (congestive heart failure) (HCC)    Coronary artery calcification seen on CT scan 07/2016   Dysrhythmia    a fib   History of blood transfusion    History of kidney stones    Hypertension    Hypomagnesemia    PAF (paroxysmal atrial fibrillation) (HCC)    a. dx during adm 08/2016.   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   Negative Jak2, BCR/ABL, normal epo level on 02/01/2014   Sepsis (HCC) 08/2016   Thrombocytopenia (HCC)    Trigeminal neuralgia     Surgical History: Past Surgical History:  Procedure Laterality Date   BREAST CYST EXCISION  60 yrs ago   COLONOSCOPY WITH ESOPHAGOGASTRODUODENOSCOPY (EGD) N/A 03/10/2013   Rehman: normal except few diverticula in simoid   CYSTOSCOPY W/ URETERAL STENT PLACEMENT Right 01/02/2021   Procedure: CYSTOSCOPY WITH RETROGRADE PYELOGRAM/URETERAL STENT PLACEMENT;  Surgeon: Malen Gauze, MD;  Location: [facility];  Service: Urology;  Laterality: Right;   CYSTOSCOPY WITH RETROGRADE PYELOGRAM, URETEROSCOPY AND STENT PLACEMENT Right 01/19/2021   Procedure: CYSTOSCOPY WITH RETROGRADE PYELOGRAM, URETEROSCOPY AND STENT EXCHANGE;  Surgeon: Malen Gauze, MD;  Location: [facility];  Service: Urology;  Laterality: Right;   ESOPHAGEAL DILATION N/A 03/08/2021   Procedure: ESOPHAGEAL DILATION;  Surgeon: Malissa Hippo, MD;  [facility];  Service: Endoscopy;  Laterality: N/A;   ESOPHAGOGASTRODUODENOSCOPY  2014   Erosive reflux esophagitis with  stricture at GE junction which was dilated with a balloon dilator to 18 mm. Moderate size sliding hiatal hernia. small ulcer at gastric body along with antral erosions   ESOPHAGOGASTRODUODENOSCOPY (EGD) WITH PROPOFOL N/A 03/08/2021   Procedure: ESOPHAGOGASTRODUODENOSCOPY (EGD) WITH PROPOFOL;  Surgeon: Malissa Hippo, MD;  [facility];  Service: Endoscopy;  Laterality: N/A;  9:20   EXTRACORPOREAL SHOCK WAVE LITHOTRIPSY Right 09/20/2016   Procedure: RIGHT EXTRACORPOREAL SHOCK WAVE LITHOTRIPSY (ESWL);  Surgeon: Alfredo Martinez, MD;  Location: [facility];  Service: Urology;  Laterality: Right;   EXTRACORPOREAL SHOCK WAVE LITHOTRIPSY Right 02/16/2019   Procedure: EXTRACORPOREAL SHOCK WAVE LITHOTRIPSY (ESWL);  Surgeon: Marcine Matar, MD;  Location: [facility];  Service: Urology;  Laterality: Right;   EXTRACORPOREAL SHOCK WAVE LITHOTRIPSY Right 04/27/2019   Procedure: EXTRACORPOREAL SHOCK WAVE LITHOTRIPSY (ESWL);  Surgeon: Marcine Matar, MD;  [facility];  Service: Urology;  Laterality: Right;   EYE SURGERY     cataract surgery bilat    Gamma Knife     Trigeminal    HOLMIUM LASER APPLICATION Right 01/19/2021   Procedure: HOLMIUM LASER APPLICATION;  Surgeon: Malen Gauze, MD;  Location: [facility];  Service: Urology;  Laterality: Right;   SLT LASER APPLICATION Left 09/27/2014   Procedure: SLT LASER APPLICATION;  Surgeon: Susa Simmonds, MD;  Location: [facility];  Service: Ophthalmology;  Laterality: Left;    Home Medications:  Allergies as of 03/22/2021       Reactions   Sulfa Antibiotics Other (See Comments)   Pt states that  this med makes her feel crazy.          Medication List        Accurate as of March 22, 2021  9:55 AM. If you have any questions, ask your nurse or doctor.          amiodarone 200 MG tablet Commonly known as: PACERONE Take 1 tablet (200 mg total) by mouth daily.   apixaban 5 MG Tabs tablet Commonly known as: ELIQUIS TAKE (1)  TABLET BY MOUTH TWICE DAILY.   Biotin 1000 MCG tablet Take 1,000 mcg by mouth daily.   BIOTIN PLUS KERATIN PO Take 250 mg by mouth.   Calcium Carbonate-Vitamin D 600-400 MG-UNIT tablet Take 1 tablet by mouth daily.   Cinnamon 500 MG capsule Take 1,000 mg by mouth every other day.   CoQ10 100 MG Caps Take 200 mg by mouth daily.   esomeprazole 20 MG capsule Commonly known as: NexIUM Take 1 capsule (20 mg total) by mouth daily before breakfast.   ferrous sulfate 325 (65 FE) MG tablet Take 325 mg by mouth daily.   furosemide 20 MG tablet Commonly known as: Lasix Take 1 tablet (20 mg total) by mouth daily as needed. What changed:  how much to take when to take this   gabapentin 300 MG capsule Commonly known as: NEURONTIN Take 300 mg by mouth every other day.   metoprolol succinate 25 MG 24 hr tablet Commonly known as: TOPROL-XL Take 1 tablet (25 mg total) by mouth in the morning and at bedtime.   multivitamin with minerals Tabs tablet Take 1 tablet by mouth daily.   potassium chloride SA 20 MEQ tablet Commonly known as: KLOR-CON M Take 1 tablet (20 mEq total) by mouth daily.   pravastatin 40 MG tablet Commonly known as: PRAVACHOL Take 40 mg by mouth at bedtime.        Allergies:  Allergies  Allergen Reactions   Sulfa Antibiotics Other (See Comments)    Pt states that this med makes her feel crazy.      Family History: Family History  Problem Relation Age of Onset   Congestive Heart Failure Mother    Heart attack Father    CAD Neg Hx     Social History:  reports that she has never smoked. She has never used smokeless tobacco. She reports that she does not drink alcohol and does not use drugs.  ROS: All other review of systems were reviewed and are negative except what is noted above in HPI  Physical Exam: BP (!) 152/81   Pulse 72   Constitutional:  Alert and oriented, No acute distress. HEENT: [place] AT, moist mucus membranes.  Trachea midline, no  masses. Cardiovascular: No clubbing, cyanosis, or edema. Respiratory: Normal respiratory effort, no increased work of breathing. GI: Abdomen is soft, nontender, nondistended, no abdominal masses GU: No CVA tenderness.  Lymph: No cervical or inguinal lymphadenopathy. Skin: No rashes, bruises or suspicious lesions. Neurologic: Grossly intact, no focal deficits, moving all 4 extremities. Psychiatric: Normal mood and affect.  Laboratory Data: Lab Results  Component Value Date   WBC 9.0 01/05/2021   HGB 12.5 01/05/2021   HCT 38.6 01/05/2021   MCV 95.3 01/05/2021   PLT 83 (L) 01/05/2021    Lab Results  Component Value Date   CREATININE 0.89 03/06/2021    No results found for: PSA  No results found for: TESTOSTERONE  No results found for: HGBA1C  Urinalysis    Component Value Date/Time  COLORURINE YELLOW 01/01/2021 2300   APPEARANCEUR Clear 01/25/2021 1559   LABSPEC 1.025 01/01/2021 2300   PHURINE 6.0 01/01/2021 2300   GLUCOSEU Negative 01/25/2021 1559   HGBUR LARGE (A) 01/01/2021 2300   BILIRUBINUR Negative 01/25/2021 1559   KETONESUR >80 (A) 01/01/2021 2300   PROTEINUR Negative 01/25/2021 1559   PROTEINUR 30 (A) 01/01/2021 2300   UROBILINOGEN negative (A) 05/19/2019 0844   NITRITE Negative 01/25/2021 1559   NITRITE POSITIVE (A) 01/01/2021 2300   LEUKOCYTESUR Negative 01/25/2021 1559   LEUKOCYTESUR SMALL (A) 01/01/2021 2300    Lab Results  Component Value Date   LABMICR Comment 01/25/2021   WBCUA >30 (A) 11/15/2020   LABEPIT 0-10 11/15/2020   BACTERIA MANY (A) 01/01/2021    Pertinent Imaging: Renal US 03/13/2021: Images reviewed and discussed with the patient  Results for orders placed during the hospital encounter of 11/14/20  DG Abd 1 View  Narrative CLINICAL DATA:  History of kidney stones  EXAM: ABDOMEN - 1 VIEW  COMPARISON:  11/17/2019  FINDINGS: Small calculus projects over the inferior pole of the right kidney. No other radiopaque calculi  identified. Probable small phlebolith in the low pelvis, unchanged. Disc degenerative disease of the thoracic spine. Nonobstructive pattern of bowel gas.  IMPRESSION: Small calculus projects over the inferior pole of the right kidney. No other radiopaque calculi identified. Probable small phlebolith in the low pelvis, unchanged.   Electronically Signed By: Eddie Candle M.D. On: 11/15/2020 10:56  No results found for this or any previous visit.  No results found for this or any previous visit.  No results found for this or any previous visit.  Results for orders placed during the hospital encounter of 03/13/21  Ultrasound renal complete  Narrative CLINICAL DATA:  Follow-up right renal calculus.  EXAM: RENAL / URINARY TRACT ULTRASOUND COMPLETE  COMPARISON:  Renal ultrasound dated 09/24/2016.  FINDINGS: Right Kidney:  Renal measurements: 9.7 x 4.0 x 4.0 cm = volume: 82 mL. Echogenicity within normal limits. No mass or hydronephrosis visualized.  Left Kidney:  Renal measurements: 9.1 x 4.8 x 4.8 cm = volume: 108 mL. Normal echogenicity. No hydronephrosis or shadowing stone. There is a 3 cm inferior pole cyst.  Bladder:  Appears normal for degree of bladder distention. The prevoid bladder volume is 95 cc and postvoid volume of 30 cc.  Other:  None.  IMPRESSION: A 3 cm left renal inferior pole cyst, otherwise unremarkable renal ultrasound. No hydronephrosis or shadowing stone.   Electronically Signed By: Anner Crete M.D. On: 03/14/2021 00:31  No results found for this or any previous visit.  No results found for this or any previous visit.  Results for orders placed during the hospital encounter of 01/01/21  CT RENAL STONE STUDY  Narrative CLINICAL DATA:  Flank pain.  Concern for kidney stone.  EXAM: CT ABDOMEN AND PELVIS WITHOUT CONTRAST  TECHNIQUE: Multidetector CT imaging of the abdomen and pelvis was performed following the standard  protocol without IV contrast.  COMPARISON:  CT of the abdomen pelvis dated 08/19/2016.  FINDINGS: Evaluation of this exam is limited in the absence of intravenous contrast.  Lower chest: Minimal bibasilar atelectasis. There is coronary vascular calcification.  No intra-abdominal free air or free fluid.  Hepatobiliary: The liver is unremarkable. No intrahepatic biliary ductal dilatation. The gallbladder is unremarkable.  Pancreas: The pancreas is unremarkable.  Spleen: Normal in size without focal abnormality.  Adrenals/Urinary Tract: The adrenal glands unremarkable. There is a 7 mm stone in the distal  right ureter with mild right hydronephrosis. There is mild right perinephric stranding. Correlation with urinalysis recommended to exclude UTI. There is a 3 cm left renal cyst. No hydronephrosis or nephrolithiasis on the left the left ureter and urinary bladder appear unremarkable.  Stomach/Bowel: Moderate size hiatal hernia. There is sigmoid diverticulosis without active inflammatory changes. There is no bowel obstruction or active inflammation. The appendix is normal.  Vascular/Lymphatic: Advanced aortoiliac atherosclerotic disease. The IVC is grossly unremarkable. No portal venous gas. There is no adenopathy.  Reproductive: The uterus is anteverted and grossly unremarkable. No adnexal masses.  Other: Induration of the subcutaneous soft tissues of the right gluteal region. No fluid collection.  Musculoskeletal: Osteopenia with degenerative changes of the spine. Grade 1 L4-L5 anterolisthesis. No acute osseous pathology.  IMPRESSION: 1. A 7 mm distal right ureteral stone with mild right hydronephrosis. Correlation with urinalysis recommended to exclude UTI. 2. Sigmoid diverticulosis. No bowel obstruction. Normal appendix. 3. Aortic Atherosclerosis (ICD10-I70.0).   Electronically Signed By: Anner Crete M.D. On: 01/02/2021 01:34   Assessment & Plan:    1.  History of kidney stones -dietary handout given -Patient to followup in 10/2021 with Dr. Diona Fanti   No follow-ups on file.  Nicolette Bang, MD  [facility] [place]

## 2021-03-22 NOTE — Addendum Note (Signed)
Addended by: Gustavus Messing on: 03/22/2021 11:52 AM   Modules accepted: Orders

## 2021-03-22 NOTE — Progress Notes (Signed)
Urological Symptom Review  Patient is experiencing the following symptoms: Frequent urination Hard to postpone urination Get up at night to urinate Leakage of urine Stream starts and stops   Review of Systems  Gastrointestinal (upper)  : Negative for upper GI symptoms  Gastrointestinal (lower) : Negative for lower GI symptoms  Constitutional : Negative for symptoms  Skin: Negative for skin symptoms  Eyes: Negative for eye symptoms  Ear/Nose/Throat : Negative for Ear/Nose/Throat symptoms  Hematologic/Lymphatic: Negative for Hematologic/Lymphatic symptoms  Cardiovascular : Leg swelling  Respiratory : Negative for respiratory symptoms  Endocrine: Negative for endocrine symptoms  Musculoskeletal: Negative for musculoskeletal symptoms  Neurological: Negative for neurological symptoms  Psychologic: Negative for psychiatric symptoms  

## 2021-03-22 NOTE — Patient Instructions (Signed)
Dietary Guidelines to Help Prevent Kidney Stones Kidney stones are deposits of minerals and salts that form inside your kidneys. Your risk of developing kidney stones may be greater depending on your diet, your lifestyle, the medicines you take, and whether you have certain medical conditions. Most people can lower their chances of developing kidney stones by following the instructions below. Your dietitian may give you more specific instructions depending on your overall health and the type of kidney stones you tend to develop. What are tips for following this plan? Reading food labels  Choose foods with "no salt added" or "low-salt" labels. Limit your salt (sodium) intake to less than 1,500 mg a day. Choose foods with calcium for each meal and snack. Try to eat about 300 mg of calcium at each meal. Foods that contain 200-500 mg of calcium a serving include: 8 oz (237 mL) of milk, calcium-fortifiednon-dairy milk, and calcium-fortifiedfruit juice. Calcium-fortified means that calcium has been added to these drinks. 8 oz (237 mL) of kefir, yogurt, and soy yogurt. 4 oz (114 g) of tofu. 1 oz (28 g) of cheese. 1 cup (150 g) of dried figs. 1 cup (91 g) of cooked broccoli. One 3 oz (85 g) can of sardines or mackerel. Most people need 1,000-1,500 mg of calcium a day. Talk to your dietitian about how much calcium is recommended for you. Shopping Buy plenty of fresh fruits and vegetables. Most people do not need to avoid fruits and vegetables, even if these foods contain nutrients that may contribute to kidney stones. When shopping for convenience foods, choose: Whole pieces of fruit. Pre-made salads with dressing on the side. Low-fat fruit and yogurt smoothies. Avoid buying frozen meals or prepared deli foods. These can be high in sodium. Look for foods with live cultures, such as yogurt and kefir. Choose high-fiber grains, such as whole-wheat breads, oat bran, and wheat cereals. Cooking Do not add  salt to food when cooking. Place a salt shaker on the table and allow each person to add his or her own salt to taste. Use vegetable protein, such as beans, textured vegetable protein (TVP), or tofu, instead of meat in pasta, casseroles, and soups. Meal planning Eat less salt, if told by your dietitian. To do this: Avoid eating processed or pre-made food. Avoid eating fast food. Eat less animal protein, including cheese, meat, poultry, or fish, if told by your dietitian. To do this: Limit the number of times you have meat, poultry, fish, or cheese each week. Eat a diet free of meat at least 2 days a week. Eat only one serving each day of meat, poultry, fish, or seafood. When you prepare animal protein, cut pieces into small portion sizes. For most meat and fish, one serving is about the size of the palm of your hand. Eat at least five servings of fresh fruits and vegetables each day. To do this: Keep fruits and vegetables on hand for snacks. Eat one piece of fruit or a handful of berries with breakfast. Have a salad and fruit at lunch. Have two kinds of vegetables at dinner. Limit foods that are high in a substance called oxalate. These include: Spinach (cooked), rhubarb, beets, sweet potatoes, and Swiss chard. Peanuts. Potato chips, french fries, and baked potatoes with skin on. Nuts and nut products. Chocolate. If you regularly take a diuretic medicine, make sure to eat at least 1 or 2 servings of fruits or vegetables that are high in potassium each day. These include: Avocado. Banana. Orange, prune,   carrot, or tomato juice. Baked potato. Cabbage. Beans and split peas. Lifestyle  Drink enough fluid to keep your urine pale yellow. This is the most important thing you can do. Spread your fluid intake throughout the day. If you drink alcohol: Limit how much you use to: 0-1 drink a day for women who are not pregnant. 0-2 drinks a day for men. Be aware of how much alcohol is in your  drink. In the [place] one drink equals one 12 oz bottle of beer (355 mL), one 5 oz glass of wine (148 mL), or one 1 oz glass of hard liquor (44 mL). Lose weight if told by your health care provider. Work with your dietitian to find an eating plan and weight loss strategies that work best for you. General information Talk to your health care provider and dietitian about taking daily supplements. You may be told the following depending on your health and the cause of your kidney stones: Not to take supplements with vitamin C. To take a calcium supplement. To take a daily probiotic supplement. To take other supplements such as magnesium, fish oil, or vitamin B6. Take over-the-counter and prescription medicines only as told by your health care provider. These include supplements. What foods should I limit? Limit your intake of the following foods, or eat them as told by your dietitian. Vegetables Spinach. Rhubarb. Beets. Canned vegetables. Pickles. Olives. Baked potatoes with skin. Grains Wheat bran. Baked goods. Salted crackers. Cereals high in sugar. Meats and other proteins Nuts. Nut butters. Large portions of meat, poultry, or fish. Salted, precooked, or cured meats, such as sausages, meat loaves, and hot dogs. Dairy Cheese. Beverages Regular soft drinks. Regular vegetable juice. Seasonings and condiments Seasoning blends with salt. Salad dressings. Soy sauce. Ketchup. Barbecue sauce. Other foods Canned soups. Canned pasta sauce. Casseroles. Pizza. Lasagna. Frozen meals. Potato chips. French fries. The items listed above may not be a complete list of foods and beverages you should limit. Contact a dietitian for more information. What foods should I avoid? Talk to your dietitian about specific foods you should avoid based on the type of kidney stones you have and your overall health. Fruits Grapefruit. The item listed above may not be a complete list of foods and beverages you should  avoid. Contact a dietitian for more information. Summary Kidney stones are deposits of minerals and salts that form inside your kidneys. You can lower your risk of kidney stones by making changes to your diet. The most important thing you can do is drink enough fluid. Drink enough fluid to keep your urine pale yellow. Talk to your dietitian about how much calcium you should have each day, and eat less salt and animal protein as told by your dietitian. This information is not intended to replace advice given to you by your health care provider. Make sure you discuss any questions you have with your health care provider. Document Revised: 04/02/2019 Document Reviewed: 04/02/2019 Elsevier Patient Education  2022 [employer] Inc.  

## 2021-03-28 DIAGNOSIS — I4891 Unspecified atrial fibrillation: Secondary | ICD-10-CM | POA: Diagnosis not present

## 2021-03-28 DIAGNOSIS — E782 Mixed hyperlipidemia: Secondary | ICD-10-CM | POA: Diagnosis not present

## 2021-03-28 DIAGNOSIS — I110 Hypertensive heart disease with heart failure: Secondary | ICD-10-CM | POA: Diagnosis not present

## 2021-03-28 DIAGNOSIS — I509 Heart failure, unspecified: Secondary | ICD-10-CM | POA: Diagnosis not present

## 2021-03-28 DIAGNOSIS — K219 Gastro-esophageal reflux disease without esophagitis: Secondary | ICD-10-CM | POA: Diagnosis not present

## 2021-05-01 DIAGNOSIS — K219 Gastro-esophageal reflux disease without esophagitis: Secondary | ICD-10-CM | POA: Diagnosis not present

## 2021-05-01 DIAGNOSIS — M2550 Pain in unspecified joint: Secondary | ICD-10-CM | POA: Diagnosis not present

## 2021-05-01 DIAGNOSIS — I110 Hypertensive heart disease with heart failure: Secondary | ICD-10-CM | POA: Diagnosis not present

## 2021-05-01 DIAGNOSIS — I4891 Unspecified atrial fibrillation: Secondary | ICD-10-CM | POA: Diagnosis not present

## 2021-05-01 DIAGNOSIS — L851 Acquired keratosis [keratoderma] palmaris et plantaris: Secondary | ICD-10-CM | POA: Diagnosis not present

## 2021-05-01 DIAGNOSIS — Z6824 Body mass index (BMI) 24.0-24.9, adult: Secondary | ICD-10-CM | POA: Diagnosis not present

## 2021-05-01 DIAGNOSIS — I739 Peripheral vascular disease, unspecified: Secondary | ICD-10-CM | POA: Diagnosis not present

## 2021-05-01 DIAGNOSIS — M79675 Pain in left toe(s): Secondary | ICD-10-CM | POA: Diagnosis not present

## 2021-05-01 DIAGNOSIS — B351 Tinea unguium: Secondary | ICD-10-CM | POA: Diagnosis not present

## 2021-05-01 DIAGNOSIS — I509 Heart failure, unspecified: Secondary | ICD-10-CM | POA: Diagnosis not present

## 2021-05-01 DIAGNOSIS — R54 Age-related physical debility: Secondary | ICD-10-CM | POA: Diagnosis not present

## 2021-05-01 DIAGNOSIS — E611 Iron deficiency: Secondary | ICD-10-CM | POA: Diagnosis not present

## 2021-05-05 DIAGNOSIS — R54 Age-related physical debility: Secondary | ICD-10-CM | POA: Diagnosis not present

## 2021-05-05 DIAGNOSIS — I129 Hypertensive chronic kidney disease with stage 1 through stage 4 chronic kidney disease, or unspecified chronic kidney disease: Secondary | ICD-10-CM | POA: Diagnosis not present

## 2021-05-05 DIAGNOSIS — E782 Mixed hyperlipidemia: Secondary | ICD-10-CM | POA: Diagnosis not present

## 2021-05-05 DIAGNOSIS — N189 Chronic kidney disease, unspecified: Secondary | ICD-10-CM | POA: Diagnosis not present

## 2021-05-05 DIAGNOSIS — I4891 Unspecified atrial fibrillation: Secondary | ICD-10-CM | POA: Diagnosis not present

## 2021-05-30 ENCOUNTER — Ambulatory Visit: Payer: PPO | Admitting: Cardiology

## 2021-05-30 ENCOUNTER — Encounter: Payer: Self-pay | Admitting: Cardiology

## 2021-05-30 NOTE — Progress Notes (Deleted)
Cardiology Office Note  Date: 05/30/2021   ID: [name], DOB 01/29/1929, MRN [id number]  PCP:  System, Provider Not In  Cardiologist:  Nona Dell, MD Electrophysiologist:  None   No chief complaint on file.   History of Present Illness: [name] is a 88 y.o. female last seen in October 2022 by Ms. Strader PA-C.  Past Medical History:  Diagnosis Date   Anemia    Ascending aortic aneurysm    a. 4.6cm by CT 07/2016.   Chronic diastolic CHF (congestive heart failure) (HCC)    Coronary artery calcification seen on CT scan 07/2016   Dysrhythmia    a fib   History of blood transfusion    History of kidney stones    Hypertension    Hypomagnesemia    PAF (paroxysmal atrial fibrillation) (HCC)    a. dx during adm 08/2016.   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   Negative Jak2, BCR/ABL, normal epo level on 02/01/2014   Sepsis (HCC) 08/2016   Thrombocytopenia (HCC)    Trigeminal neuralgia     Past Surgical History:  Procedure Laterality Date   BREAST CYST EXCISION  60 yrs ago   COLONOSCOPY WITH ESOPHAGOGASTRODUODENOSCOPY (EGD) N/A 03/10/2013   Rehman: normal except few diverticula in simoid   CYSTOSCOPY W/ URETERAL STENT PLACEMENT Right 01/02/2021   Procedure: CYSTOSCOPY WITH RETROGRADE PYELOGRAM/URETERAL STENT PLACEMENT;  Surgeon: Malen Gauze, MD;  Location: [facility];  Service: Urology;  Laterality: Right;   CYSTOSCOPY WITH RETROGRADE PYELOGRAM, URETEROSCOPY AND STENT PLACEMENT Right 01/19/2021   Procedure: CYSTOSCOPY WITH RETROGRADE PYELOGRAM, URETEROSCOPY AND STENT EXCHANGE;  Surgeon: Malen Gauze, MD;  Location: [facility];  Service: Urology;  Laterality: Right;   ESOPHAGEAL DILATION N/A 03/08/2021   Procedure: ESOPHAGEAL DILATION;  Surgeon: Malissa Hippo, MD;  [facility];  Service: Endoscopy;  Laterality: N/A;   ESOPHAGOGASTRODUODENOSCOPY  2014   Erosive reflux esophagitis with stricture at GE junction which was dilated with a balloon  dilator to 18 mm. Moderate size sliding hiatal hernia. small ulcer at gastric body along with antral erosions   ESOPHAGOGASTRODUODENOSCOPY (EGD) WITH PROPOFOL N/A 03/08/2021   Procedure: ESOPHAGOGASTRODUODENOSCOPY (EGD) WITH PROPOFOL;  Surgeon: Malissa Hippo, MD;  [facility];  Service: Endoscopy;  Laterality: N/A;  9:20   EXTRACORPOREAL SHOCK WAVE LITHOTRIPSY Right 09/20/2016   Procedure: RIGHT EXTRACORPOREAL SHOCK WAVE LITHOTRIPSY (ESWL);  Surgeon: Alfredo Martinez, MD;  [facility];  Service: Urology;  Laterality: Right;   EXTRACORPOREAL SHOCK WAVE LITHOTRIPSY Right 02/16/2019   Procedure: EXTRACORPOREAL SHOCK WAVE LITHOTRIPSY (ESWL);  Surgeon: Marcine Matar, MD;  [facility];  Service: Urology;  Laterality: Right;   EXTRACORPOREAL SHOCK WAVE LITHOTRIPSY Right 04/27/2019   Procedure: EXTRACORPOREAL SHOCK WAVE LITHOTRIPSY (ESWL);  Surgeon: Marcine Matar, MD;  [facility];  Service: Urology;  Laterality: Right;   EYE SURGERY     cataract surgery bilat    Gamma Knife     Trigeminal    HOLMIUM LASER APPLICATION Right 01/19/2021   Procedure: HOLMIUM LASER APPLICATION;  Surgeon: Malen Gauze, MD;  Location: [facility];  Service: Urology;  Laterality: Right;   SLT LASER APPLICATION Left 09/27/2014   Procedure: SLT LASER APPLICATION;  Surgeon: Susa Simmonds, MD;  Location: [facility];  Service: Ophthalmology;  Laterality: Left;    Current Outpatient Medications  Medication Sig Dispense Refill   amiodarone (PACERONE) 200 MG tablet Take 1 tablet (200 mg total) by mouth daily. 90 tablet 3  apixaban (ELIQUIS) 5 MG TABS tablet TAKE (1) TABLET BY MOUTH TWICE DAILY. 180 tablet 0   Biotin 1000 MCG tablet Take 1,000 mcg by mouth daily.     Calcium Carbonate-Vitamin D 600-400 MG-UNIT tablet Take 1 tablet by mouth daily.     Cinnamon 500 MG capsule Take 1,000 mg by mouth every other day.     Coenzyme Q10 (COQ10) 100 MG CAPS Take 200 mg by mouth daily.       esomeprazole (NEXIUM) 20 MG capsule Take 1 capsule (20 mg total) by mouth daily before breakfast. 60 capsule 3   ferrous sulfate 325 (65 FE) MG tablet Take 325 mg by mouth daily.      furosemide (LASIX) 20 MG tablet Take 1 tablet (20 mg total) by mouth daily as needed. (Patient taking differently: Take 40 mg by mouth daily.) 90 tablet 3   gabapentin (NEURONTIN) 300 MG capsule Take 300 mg by mouth every other day.     metoprolol succinate (TOPROL-XL) 25 MG 24 hr tablet Take 1 tablet (25 mg total) by mouth in the morning and at bedtime. 60 tablet 2   Multiple Vitamin (MULTIVITAMIN WITH MINERALS) TABS tablet Take 1 tablet by mouth daily.     potassium chloride SA (KLOR-CON) 20 MEQ tablet Take 1 tablet (20 mEq total) by mouth daily. 30 tablet 1   pravastatin (PRAVACHOL) 40 MG tablet Take 40 mg by mouth at bedtime.      Specialty Vitamins Products (BIOTIN PLUS KERATIN PO) Take 250 mg by mouth.     No current facility-administered medications for this visit.   Allergies:  Sulfa antibiotics   Social History: The patient  reports that she has never smoked. She has never used smokeless tobacco. She reports that she does not drink alcohol and does not use drugs.   Family History: The patient's family history includes Congestive Heart Failure in her mother; Heart attack in her father.   ROS:  Please see the history of present illness. Otherwise, complete review of systems is positive for {NONE DEFAULTED:18576}.  All other systems are reviewed and negative.   Physical Exam: VS:  There were no vitals taken for this visit., BMI There is no height or weight on file to calculate BMI.  Wt Readings from Last 3 Encounters:  03/21/21 129 lb (58.5 kg)  03/08/21 129 lb 13.6 oz (58.9 kg)  03/06/21 130 lb (59 kg)    General: Patient appears comfortable at rest. HEENT: Conjunctiva and lids normal, oropharynx clear with moist mucosa. Neck: Supple, no elevated JVP or carotid bruits, no thyromegaly. Lungs: Clear  to auscultation, nonlabored breathing at rest. Cardiac: Regular rate and rhythm, no S3 or significant systolic murmur, no pericardial rub. Abdomen: Soft, nontender, no hepatomegaly, bowel sounds present, no guarding or rebound. Extremities: No pitting edema, distal pulses 2+. Skin: Warm and dry. Musculoskeletal: No kyphosis. Neuropsychiatric: Alert and oriented x3, affect grossly appropriate.  ECG:  An ECG dated 02/09/2021 was personally reviewed today and demonstrated:  Sinus rhythm.  Recent Labwork: 01/03/2021: ALT 20; AST 21 01/05/2021: Hemoglobin 12.5; Platelets 83 01/06/2021: Magnesium 1.8 03/06/2021: BUN 14; Creatinine, Ser 0.89; Potassium 4.7; Sodium 137   Other Studies Reviewed Today:  Echocardiogram 01/03/2021:  1. Left ventricular ejection fraction, by estimation, is 60 to 65%. The  left ventricle has normal function. The left ventricle has no regional  wall motion abnormalities. Left ventricular diastolic parameters were  normal.   2. Right ventricular systolic function is normal. The right ventricular  size  is mildly enlarged. There is severely elevated pulmonary artery  systolic pressure. The estimated right ventricular systolic pressure is  63.4 mmHg.   3. Left atrial size was mildly dilated.   4. Right atrial size was mildly dilated.   5. The mitral valve is degenerative. Mild to moderate mitral valve  regurgitation with very eccentric jet.   6. Tricuspid valve regurgitation is moderate to severe.   7. The aortic valve is tricuspid. There is mild calcification of the  aortic valve. Aortic valve regurgitation is mild. Mild to moderate aortic  valve sclerosis/calcification is present, without any evidence of aortic  stenosis. Aortic valve mean gradient  measures 7.0 mmHg.   8. Aortic dilatation noted. There is moderate dilatation of the ascending  aorta, measuring 42 mm.   9. The inferior vena cava is dilated in size with <50% respiratory  variability, suggesting  right atrial pressure of 15 mmHg.   Assessment and Plan:   Medication Adjustments/Labs and Tests Ordered: Current medicines are reviewed at length with the patient today.  Concerns regarding medicines are outlined above.   Tests Ordered: No orders of the defined types were placed in this encounter.   Medication Changes: No orders of the defined types were placed in this encounter.   Disposition:  Follow up {follow up:15908}  Signed, Jonelle Sidle, MD, [facility] 05/30/2021 1:25 PM    [facility] [address], [place], [place] [address] Phone: [phone]; Fax: [phone]

## 2021-06-08 DIAGNOSIS — Z7902 Long term (current) use of antithrombotics/antiplatelets: Secondary | ICD-10-CM | POA: Diagnosis not present

## 2021-06-08 DIAGNOSIS — I509 Heart failure, unspecified: Secondary | ICD-10-CM | POA: Diagnosis not present

## 2021-06-08 DIAGNOSIS — I110 Hypertensive heart disease with heart failure: Secondary | ICD-10-CM | POA: Diagnosis not present

## 2021-06-08 DIAGNOSIS — I4891 Unspecified atrial fibrillation: Secondary | ICD-10-CM | POA: Diagnosis not present

## 2021-06-20 ENCOUNTER — Ambulatory Visit (INDEPENDENT_AMBULATORY_CARE_PROVIDER_SITE_OTHER): Payer: PPO | Admitting: Internal Medicine

## 2021-07-10 DIAGNOSIS — M79675 Pain in left toe(s): Secondary | ICD-10-CM | POA: Diagnosis not present

## 2021-07-10 DIAGNOSIS — L851 Acquired keratosis [keratoderma] palmaris et plantaris: Secondary | ICD-10-CM | POA: Diagnosis not present

## 2021-07-10 DIAGNOSIS — I739 Peripheral vascular disease, unspecified: Secondary | ICD-10-CM | POA: Diagnosis not present

## 2021-07-10 DIAGNOSIS — B351 Tinea unguium: Secondary | ICD-10-CM | POA: Diagnosis not present

## 2021-07-12 DIAGNOSIS — I4891 Unspecified atrial fibrillation: Secondary | ICD-10-CM | POA: Diagnosis not present

## 2021-07-12 DIAGNOSIS — R54 Age-related physical debility: Secondary | ICD-10-CM | POA: Diagnosis not present

## 2021-07-12 DIAGNOSIS — K219 Gastro-esophageal reflux disease without esophagitis: Secondary | ICD-10-CM | POA: Diagnosis not present

## 2021-07-12 DIAGNOSIS — I110 Hypertensive heart disease with heart failure: Secondary | ICD-10-CM | POA: Diagnosis not present

## 2021-07-12 DIAGNOSIS — M2550 Pain in unspecified joint: Secondary | ICD-10-CM | POA: Diagnosis not present

## 2021-07-12 DIAGNOSIS — Z7901 Long term (current) use of anticoagulants: Secondary | ICD-10-CM | POA: Diagnosis not present

## 2021-07-12 DIAGNOSIS — Z6824 Body mass index (BMI) 24.0-24.9, adult: Secondary | ICD-10-CM | POA: Diagnosis not present

## 2021-07-12 DIAGNOSIS — E611 Iron deficiency: Secondary | ICD-10-CM | POA: Diagnosis not present

## 2021-07-12 DIAGNOSIS — I509 Heart failure, unspecified: Secondary | ICD-10-CM | POA: Diagnosis not present

## 2021-08-14 ENCOUNTER — Ambulatory Visit (INDEPENDENT_AMBULATORY_CARE_PROVIDER_SITE_OTHER): Payer: PPO | Admitting: Gastroenterology

## 2021-08-14 ENCOUNTER — Encounter (INDEPENDENT_AMBULATORY_CARE_PROVIDER_SITE_OTHER): Payer: Self-pay | Admitting: Gastroenterology

## 2021-08-14 VITALS — BP 146/83 | HR 64 | Temp 97.6°F | Ht 62.0 in | Wt 141.9 lb

## 2021-08-14 DIAGNOSIS — R1310 Dysphagia, unspecified: Secondary | ICD-10-CM

## 2021-08-14 MED ORDER — ESOMEPRAZOLE MAGNESIUM 20 MG PO CPDR: 20.0000 mg | DELAYED_RELEASE_CAPSULE | Freq: Every day | ORAL | 3 refills | Status: DC

## 2021-08-14 NOTE — Progress Notes (Signed)
? ?Referring Provider: No ref. provider found ?Primary Care Physician:  System, Provider Not In ?Primary GI Physician: Rehman ? ?Chief Complaint  ?Patient presents with  ? Follow-up  ?  Follow up for dysphagia patient states the dysphagia is better but still having some symptoms the esomeprazole 20 mg is helping   ? ?HPI:  ?[name] is a 88 y.o. female with past medical history of anemia, CHF, A fib, HTN, thrombocytopenia, renal calculi AAA.  ?  ?Patient presenting today for follow up of dysphagia. ?  ?Last seen November 2022 with dysphagia/thickenss in her throat, denied GERD symptoms, not on PPI or H2B therapy at that time, had recently done some swallowing therapy with SLP but did not feel that this really helped. She was started on esomeprazole 20mg  daily and scheduled for EGD on 03/08/21 with findings of esophageal web, report as below.  ? ?Today, she states that swallowing is improved and she is doing well on esomeprazole once daily. She eats a lot of veggies. Ate a steak yesterday and did well with this, cut it into very small bites. Daughter also makes her soups which she enjoys. She states that appetite is good. She has gained about 10 pounds since her last visit. She states that she tries to be active, does all of her housework. Denies any issues with acid reflux. Denies any episodes of needing to cough foods back up. She has no GI concerns today ? ?Last Colonoscopy:2014 Normal colonoscopy except few diverticula at sigmoid colon. ?Last Endoscopy:11/136/22Normal hypopharynx. ?- Web in the proximal esophagus. It was disrupted by passing 54 03/10/21 dilator ?resulting in superficial mucosal disruption. dilator however could not be passed to full ?length. ?- Benign-appearing esophageal stenosis At the GE junction was dilated with balloon dilator ?from 15 to 18 mm. ?- 4 cm hiatal hernia. ?- Erythematous mucosa in the antrum. ?- Normal duodenal bulb and second portion of the duodenum. ?- No  specimens collected. ? ?Past Medical History:  ?Diagnosis Date  ? Anemia   ? Ascending aortic aneurysm (HCC)   ? a. 4.6cm by CT 07/2016.  ? Chronic diastolic CHF (congestive heart failure) (HCC)   ? Coronary artery calcification seen on CT scan 07/2016  ? History of blood transfusion   ? History of kidney stones   ? Hypertension   ? Hypomagnesemia   ? PAF (paroxysmal atrial fibrillation) (HCC)   ? a. dx during adm 08/2016.  ? 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  ? Negative Jak2, BCR/ABL, normal epo level on 02/01/2014  ? Sepsis (HCC) 08/2016  ? Thrombocytopenia (HCC)   ? Trigeminal neuralgia   ? ? ?Past Surgical History:  ?Procedure Laterality Date  ? BREAST CYST EXCISION  60 yrs ago  ? COLONOSCOPY WITH ESOPHAGOGASTRODUODENOSCOPY (EGD) N/A 03/10/2013  ? Rehman: normal except few diverticula in simoid  ? CYSTOSCOPY W/ URETERAL STENT PLACEMENT Right 01/02/2021  ? Procedure: CYSTOSCOPY WITH RETROGRADE PYELOGRAM/URETERAL STENT PLACEMENT;  Surgeon: 03/04/2021, MD;  Location: [facility];  Service: Urology;  Laterality: Right;  ? CYSTOSCOPY WITH RETROGRADE PYELOGRAM, URETEROSCOPY AND STENT PLACEMENT Right 01/19/2021  ? Procedure: CYSTOSCOPY WITH RETROGRADE PYELOGRAM, URETEROSCOPY AND STENT EXCHANGE;  Surgeon: 01/21/2021, MD;  Location: [facility];  Service: Urology;  Laterality: Right;  ? ESOPHAGEAL DILATION N/A 03/08/2021  ? Procedure: ESOPHAGEAL DILATION;  Surgeon: 03/10/2021, MD;  [facility];  Service: Endoscopy;  Laterality: N/A;  ? ESOPHAGOGASTRODUODENOSCOPY  2014  ? Erosive reflux esophagitis with stricture at GE junction  which was dilated with a balloon dilator to 18 mm. Moderate size sliding hiatal hernia. small ulcer at gastric body along with antral erosions  ? ESOPHAGOGASTRODUODENOSCOPY (EGD) WITH PROPOFOL N/A 03/08/2021  ? Procedure: ESOPHAGOGASTRODUODENOSCOPY (EGD) WITH PROPOFOL;  Surgeon: Malissa Hippoehman, Najeeb U, MD;  [facility]: [facility];  Service: Endoscopy;  Laterality: N/A;  9:20  ?  EXTRACORPOREAL SHOCK WAVE LITHOTRIPSY Right 09/20/2016  ? Procedure: RIGHT EXTRACORPOREAL SHOCK WAVE LITHOTRIPSY (ESWL);  Surgeon: Alfredo MartinezMacDiarmid, Scott, MD;  Location: [facility];  Service: Urology;  Laterality: Right;  ? EXTRACORPOREAL SHOCK WAVE LITHOTRIPSY Right 02/16/2019  ? Procedure: EXTRACORPOREAL SHOCK WAVE LITHOTRIPSY (ESWL);  Surgeon: Marcine Matarahlstedt, Stephen, MD;  Location: [facility];  Service: Urology;  Laterality: Right;  ? EXTRACORPOREAL SHOCK WAVE LITHOTRIPSY Right 04/27/2019  ? Procedure: EXTRACORPOREAL SHOCK WAVE LITHOTRIPSY (ESWL);  Surgeon: Marcine Matarahlstedt, Stephen, MD;  Location: [facility];  Service: Urology;  Laterality: Right;  ? EYE SURGERY    ? cataract surgery bilat   ? Gamma Knife    ? Trigeminal   ? HOLMIUM LASER APPLICATION Right 01/19/2021  ? Procedure: HOLMIUM LASER APPLICATION;  Surgeon: Malen GauzeMcKenzie, Patrick L, MD;  Location: [facility];  Service: Urology;  Laterality: Right;  ? SLT LASER APPLICATION Left 09/27/2014  ? Procedure: SLT LASER APPLICATION;  Surgeon: [name], MD;  Location: [facility];  Service: Ophthalmology;  Laterality: Left;  ? ? ?Current Outpatient Medications  ?Medication Sig Dispense Refill  ? amiodarone (PACERONE) 200 MG tablet Take 1 tablet (200 mg total) by mouth daily. 90 tablet 3  ? apixaban (ELIQUIS) 5 MG TABS tablet TAKE (1) TABLET BY MOUTH TWICE DAILY. 180 tablet 0  ? Biotin 1000 MCG tablet Take 1,000 mcg by mouth daily.    ? Calcium Carbonate-Vitamin D 600-400 MG-UNIT tablet Take 1 tablet by mouth daily.    ? Cinnamon 500 MG capsule Take 1,000 mg by mouth every other day.    ? Coenzyme Q10 (COQ10) 100 MG CAPS Take 200 mg by mouth daily.     ? esomeprazole (NEXIUM) 20 MG capsule Take 1 capsule (20 mg total) by mouth daily before breakfast. 60 capsule 3  ? ferrous sulfate 325 (65 FE) MG tablet Take 325 mg by mouth daily.     ? furosemide (LASIX) 20 MG tablet Take 1 tablet (20 mg total) by mouth daily as needed. (Patient taking differently: Take 40 mg by mouth daily.) 90 tablet 3  ?  gabapentin (NEURONTIN) 300 MG capsule Take 300 mg by mouth every other day.    ? metoprolol succinate (TOPROL-XL) 25 MG 24 hr tablet Take 1 tablet (25 mg total) by mouth in the morning and at bedtime. 60 tablet 2  ? Multiple Vitamin (MULTIVITAMIN WITH MINERALS) TABS tablet Take 1 tablet by mouth daily.    ? potassium chloride SA (KLOR-CON) 20 MEQ tablet Take 1 tablet (20 mEq total) by mouth daily. 30 tablet 1  ? pravastatin (PRAVACHOL) 40 MG tablet Take 40 mg by mouth at bedtime.     ? Specialty Vitamins Products (BIOTIN PLUS KERATIN PO) Take 250 mg by mouth.    ? ?No current facility-administered medications for this visit.  ? ? ?Allergies as of 08/14/2021 - Review Complete 03/22/2021  ?Allergen Reaction Noted  ? Sulfa antibiotics Other (See Comments) 02/20/2013  ? ? ?Family History  ?Problem Relation Age of Onset  ? Congestive Heart Failure Mother   ? Heart attack Father   ? CAD Neg Hx   ? ? ?Social History  ? ?Socioeconomic History  ?  Marital status: Widowed  ?  Spouse name: Not on file  ? Number of children: Not on file  ? Years of education: Not on file  ? Highest education level: Not on file  ?Occupational History  ? Not on file  ?Tobacco Use  ? Smoking status: Never  ? Smokeless tobacco: Never  ?Vaping Use  ? Vaping Use: Never used  ?Substance and Sexual Activity  ? Alcohol use: No  ? Drug use: No  ? Sexual activity: Never  ?Other Topics Concern  ? Not on file  ?Social History Narrative  ? Not on file  ? ?Social Determinants of Health  ? ?Financial Resource Strain: Not on file  ?Food Insecurity: Not on file  ?Transportation Needs: Not on file  ?Physical Activity: Not on file  ?Stress: Not on file  ?Social Connections: Not on file  ? ?Review of systems ?General: negative for malaise, night sweats, fever, chills, weight loss ?Neck: Negative for lumps, goiter, pain and significant neck swelling ?Resp: Negative for cough, wheezing, dyspnea at rest ?CV: Negative for chest pain, leg swelling, palpitations,  orthopnea ?GI: denies melena, hematochezia, nausea, vomiting, diarrhea, constipation, odynophagia, early satiety or unintentional weight loss. ?MSK: Negative for joint pain or swelling, back pain, and muscle pain. ?Derm

## 2021-08-14 NOTE — Patient Instructions (Signed)
I'm glad to see you are doing so well! ?Let's continue with the low dose esomeprazole once daily ?Make sure you are chewing well and taking small bites ?We will plan to see you again in 1 year unless you have new or worsening GI symptoms ? ?Happy early birthday!!  ?

## 2021-08-15 DIAGNOSIS — Z6824 Body mass index (BMI) 24.0-24.9, adult: Secondary | ICD-10-CM | POA: Diagnosis not present

## 2021-08-15 DIAGNOSIS — Z9181 History of falling: Secondary | ICD-10-CM | POA: Diagnosis not present

## 2021-08-15 DIAGNOSIS — K219 Gastro-esophageal reflux disease without esophagitis: Secondary | ICD-10-CM | POA: Diagnosis not present

## 2021-08-15 DIAGNOSIS — I10 Essential (primary) hypertension: Secondary | ICD-10-CM | POA: Diagnosis not present

## 2021-08-15 DIAGNOSIS — E782 Mixed hyperlipidemia: Secondary | ICD-10-CM | POA: Diagnosis not present

## 2021-08-21 DIAGNOSIS — R296 Repeated falls: Secondary | ICD-10-CM | POA: Diagnosis not present

## 2021-08-21 DIAGNOSIS — M25562 Pain in left knee: Secondary | ICD-10-CM | POA: Diagnosis not present

## 2021-08-22 DIAGNOSIS — Z7901 Long term (current) use of anticoagulants: Secondary | ICD-10-CM | POA: Diagnosis not present

## 2021-08-22 DIAGNOSIS — I4891 Unspecified atrial fibrillation: Secondary | ICD-10-CM | POA: Diagnosis not present

## 2021-08-22 DIAGNOSIS — L03116 Cellulitis of left lower limb: Secondary | ICD-10-CM | POA: Diagnosis not present

## 2021-08-22 DIAGNOSIS — M25562 Pain in left knee: Secondary | ICD-10-CM | POA: Diagnosis not present

## 2021-08-22 DIAGNOSIS — I10 Essential (primary) hypertension: Secondary | ICD-10-CM | POA: Diagnosis not present

## 2021-08-22 DIAGNOSIS — W1789XD Other fall from one level to another, subsequent encounter: Secondary | ICD-10-CM | POA: Diagnosis not present

## 2021-08-22 DIAGNOSIS — M79605 Pain in left leg: Secondary | ICD-10-CM | POA: Diagnosis not present

## 2021-08-24 DIAGNOSIS — M79605 Pain in left leg: Secondary | ICD-10-CM | POA: Diagnosis not present

## 2021-08-24 DIAGNOSIS — L03116 Cellulitis of left lower limb: Secondary | ICD-10-CM | POA: Diagnosis not present

## 2021-08-30 DIAGNOSIS — M25562 Pain in left knee: Secondary | ICD-10-CM | POA: Diagnosis not present

## 2021-08-30 DIAGNOSIS — M79605 Pain in left leg: Secondary | ICD-10-CM | POA: Diagnosis not present

## 2021-08-30 DIAGNOSIS — Z9181 History of falling: Secondary | ICD-10-CM | POA: Diagnosis not present

## 2021-08-30 DIAGNOSIS — L03116 Cellulitis of left lower limb: Secondary | ICD-10-CM | POA: Diagnosis not present

## 2021-09-01 DIAGNOSIS — F418 Other specified anxiety disorders: Secondary | ICD-10-CM | POA: Diagnosis not present

## 2021-09-01 DIAGNOSIS — L03116 Cellulitis of left lower limb: Secondary | ICD-10-CM | POA: Diagnosis not present

## 2021-09-01 DIAGNOSIS — M7989 Other specified soft tissue disorders: Secondary | ICD-10-CM | POA: Diagnosis not present

## 2021-09-01 DIAGNOSIS — R2681 Unsteadiness on feet: Secondary | ICD-10-CM | POA: Diagnosis not present

## 2021-09-05 DIAGNOSIS — I509 Heart failure, unspecified: Secondary | ICD-10-CM | POA: Diagnosis not present

## 2021-09-05 DIAGNOSIS — E782 Mixed hyperlipidemia: Secondary | ICD-10-CM | POA: Diagnosis not present

## 2021-09-05 DIAGNOSIS — R54 Age-related physical debility: Secondary | ICD-10-CM | POA: Diagnosis not present

## 2021-09-05 DIAGNOSIS — I4891 Unspecified atrial fibrillation: Secondary | ICD-10-CM | POA: Diagnosis not present

## 2021-09-05 DIAGNOSIS — N189 Chronic kidney disease, unspecified: Secondary | ICD-10-CM | POA: Diagnosis not present

## 2021-09-05 DIAGNOSIS — I130 Hypertensive heart and chronic kidney disease with heart failure and stage 1 through stage 4 chronic kidney disease, or unspecified chronic kidney disease: Secondary | ICD-10-CM | POA: Diagnosis not present

## 2021-09-06 DIAGNOSIS — Z792 Long term (current) use of antibiotics: Secondary | ICD-10-CM | POA: Diagnosis not present

## 2021-09-06 DIAGNOSIS — M6281 Muscle weakness (generalized): Secondary | ICD-10-CM | POA: Diagnosis not present

## 2021-09-06 DIAGNOSIS — M7989 Other specified soft tissue disorders: Secondary | ICD-10-CM | POA: Diagnosis not present

## 2021-09-06 DIAGNOSIS — L03116 Cellulitis of left lower limb: Secondary | ICD-10-CM | POA: Diagnosis not present

## 2021-09-28 DIAGNOSIS — M2550 Pain in unspecified joint: Secondary | ICD-10-CM | POA: Diagnosis not present

## 2021-09-28 DIAGNOSIS — E782 Mixed hyperlipidemia: Secondary | ICD-10-CM | POA: Diagnosis not present

## 2021-09-28 DIAGNOSIS — I10 Essential (primary) hypertension: Secondary | ICD-10-CM | POA: Diagnosis not present

## 2021-09-28 DIAGNOSIS — K219 Gastro-esophageal reflux disease without esophagitis: Secondary | ICD-10-CM | POA: Diagnosis not present

## 2021-10-02 DIAGNOSIS — L851 Acquired keratosis [keratoderma] palmaris et plantaris: Secondary | ICD-10-CM | POA: Diagnosis not present

## 2021-10-02 DIAGNOSIS — I739 Peripheral vascular disease, unspecified: Secondary | ICD-10-CM | POA: Diagnosis not present

## 2021-10-02 DIAGNOSIS — B351 Tinea unguium: Secondary | ICD-10-CM | POA: Diagnosis not present

## 2021-10-02 DIAGNOSIS — M79675 Pain in left toe(s): Secondary | ICD-10-CM | POA: Diagnosis not present

## 2021-10-20 DIAGNOSIS — R54 Age-related physical debility: Secondary | ICD-10-CM | POA: Diagnosis not present

## 2021-10-20 DIAGNOSIS — N189 Chronic kidney disease, unspecified: Secondary | ICD-10-CM | POA: Diagnosis not present

## 2021-10-20 DIAGNOSIS — I4891 Unspecified atrial fibrillation: Secondary | ICD-10-CM | POA: Diagnosis not present

## 2021-10-20 DIAGNOSIS — E782 Mixed hyperlipidemia: Secondary | ICD-10-CM | POA: Diagnosis not present

## 2021-10-20 DIAGNOSIS — I509 Heart failure, unspecified: Secondary | ICD-10-CM | POA: Diagnosis not present

## 2021-10-20 DIAGNOSIS — I110 Hypertensive heart disease with heart failure: Secondary | ICD-10-CM | POA: Diagnosis not present

## 2021-10-25 DIAGNOSIS — I509 Heart failure, unspecified: Secondary | ICD-10-CM | POA: Diagnosis not present

## 2021-10-25 DIAGNOSIS — R54 Age-related physical debility: Secondary | ICD-10-CM | POA: Diagnosis not present

## 2021-10-25 DIAGNOSIS — R531 Weakness: Secondary | ICD-10-CM | POA: Diagnosis not present

## 2021-10-31 ENCOUNTER — Other Ambulatory Visit: Payer: Self-pay | Admitting: Student

## 2021-11-14 ENCOUNTER — Ambulatory Visit: Payer: PPO | Admitting: Urology

## 2021-11-21 ENCOUNTER — Encounter: Payer: Self-pay | Admitting: Urology

## 2021-11-21 ENCOUNTER — Ambulatory Visit (INDEPENDENT_AMBULATORY_CARE_PROVIDER_SITE_OTHER): Payer: PPO | Admitting: Urology

## 2021-11-21 VITALS — BP 170/90 | HR 66 | Ht 62.0 in | Wt 144.0 lb

## 2021-11-21 DIAGNOSIS — Z87442 Personal history of urinary calculi: Secondary | ICD-10-CM | POA: Diagnosis not present

## 2021-11-21 DIAGNOSIS — N3281 Overactive bladder: Secondary | ICD-10-CM | POA: Diagnosis not present

## 2021-11-21 DIAGNOSIS — R3915 Urgency of urination: Secondary | ICD-10-CM

## 2021-11-21 DIAGNOSIS — R350 Frequency of micturition: Secondary | ICD-10-CM

## 2021-11-21 DIAGNOSIS — R32 Unspecified urinary incontinence: Secondary | ICD-10-CM | POA: Diagnosis not present

## 2021-11-21 LAB — URINALYSIS, ROUTINE W REFLEX MICROSCOPIC
Bilirubin, UA: NEGATIVE
Glucose, UA: NEGATIVE
Ketones, UA: NEGATIVE
Nitrite, UA: NEGATIVE
Protein,UA: NEGATIVE
Specific Gravity, UA: 1.025 (ref 1.005–1.030)
Urobilinogen, Ur: 0.2 mg/dL (ref 0.2–1.0)
pH, UA: 5 (ref 5.0–7.5)

## 2021-11-21 LAB — MICROSCOPIC EXAMINATION: Renal Epithel, UA: NONE SEEN /hpf

## 2021-11-21 NOTE — Progress Notes (Signed)
History of Present Illness: [name] is a 88 y.o. year old female here for follow-up of urolithiasis.  She had lithotripsy in 2018 of a right sided stone.  At her visit last year, in late July, she had a 7 mm right lower pole stone.  She became symptomatic with an infection and obstruction of the stone about 2 months later.  She had urgent stenting on 02 January 2021, and subsequent ureteroscopic management on the 29th.  She has been doing well since that time.  Biggest issue is urinary frequency, urgency, fecal urgency as well as urgency incontinence of urine.  This is quite bothersome to her.  She has mobility restrictions.  She goes through 2-3 pads a day.  Past Medical History:  Diagnosis Date   Anemia    Ascending aortic aneurysm (HCC)    a. 4.6cm by CT 07/2016.   Chronic diastolic CHF (congestive heart failure) (HCC)    Coronary artery calcification seen on CT scan 07/2016   History of blood transfusion    History of kidney stones    Hypertension    Hypomagnesemia    PAF (paroxysmal atrial fibrillation) (HCC)    a. dx during adm 08/2016.   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   Negative Jak2, BCR/ABL, normal epo level on 02/01/2014   Sepsis (HCC) 08/2016   Thrombocytopenia (HCC)    Trigeminal neuralgia     Past Surgical History:  Procedure Laterality Date   BREAST CYST EXCISION  60 yrs ago   COLONOSCOPY WITH ESOPHAGOGASTRODUODENOSCOPY (EGD) N/A 03/10/2013   Rehman: normal except few diverticula in simoid   CYSTOSCOPY W/ URETERAL STENT PLACEMENT Right 01/02/2021   Procedure: CYSTOSCOPY WITH RETROGRADE PYELOGRAM/URETERAL STENT PLACEMENT;  Surgeon: Malen Gauze, MD;  Location: [facility];  Service: Urology;  Laterality: Right;   CYSTOSCOPY WITH RETROGRADE PYELOGRAM, URETEROSCOPY AND STENT PLACEMENT Right 01/19/2021   Procedure: CYSTOSCOPY WITH RETROGRADE PYELOGRAM, URETEROSCOPY AND STENT EXCHANGE;  Surgeon: Malen Gauze, MD;  Location: [facility];  Service: Urology;   Laterality: Right;   ESOPHAGEAL DILATION N/A 03/08/2021   Procedure: ESOPHAGEAL DILATION;  Surgeon: Malissa Hippo, MD;  [facility];  Service: Endoscopy;  Laterality: N/A;   ESOPHAGOGASTRODUODENOSCOPY  2014   Erosive reflux esophagitis with stricture at GE junction which was dilated with a balloon dilator to 18 mm. Moderate size sliding hiatal hernia. small ulcer at gastric body along with antral erosions   ESOPHAGOGASTRODUODENOSCOPY (EGD) WITH PROPOFOL N/A 03/08/2021   Procedure: ESOPHAGOGASTRODUODENOSCOPY (EGD) WITH PROPOFOL;  Surgeon: Malissa Hippo, MD;  [facility];  Service: Endoscopy;  Laterality: N/A;  9:20   EXTRACORPOREAL SHOCK WAVE LITHOTRIPSY Right 09/20/2016   Procedure: RIGHT EXTRACORPOREAL SHOCK WAVE LITHOTRIPSY (ESWL);  Surgeon: Alfredo Martinez, MD;  Location: [facility];  Service: Urology;  Laterality: Right;   EXTRACORPOREAL SHOCK WAVE LITHOTRIPSY Right 02/16/2019   Procedure: EXTRACORPOREAL SHOCK WAVE LITHOTRIPSY (ESWL);  Surgeon: Marcine Matar, MD;  Location: [facility];  Service: Urology;  Laterality: Right;   EXTRACORPOREAL SHOCK WAVE LITHOTRIPSY Right 04/27/2019   Procedure: EXTRACORPOREAL SHOCK WAVE LITHOTRIPSY (ESWL);  Surgeon: Marcine Matar, MD;  Location: [facility];  Service: Urology;  Laterality: Right;   EYE SURGERY     cataract surgery bilat    Gamma Knife     Trigeminal    HOLMIUM LASER APPLICATION Right 01/19/2021   Procedure: HOLMIUM LASER APPLICATION;  Surgeon: Malen Gauze, MD;  Location: [facility];  Service: Urology;  Laterality: Right;   SLT LASER APPLICATION Left  09/27/2014   Procedure: SLT LASER APPLICATION;  Surgeon: Susa Simmonds, MD;  Location: [facility];  Service: Ophthalmology;  Laterality: Left;    Home Medications:  (Not in a hospital admission)   Allergies:  Allergies  Allergen Reactions   Sulfa Antibiotics Other (See Comments)    Pt states that this med makes her feel crazy.      Family History   Problem Relation Age of Onset   Congestive Heart Failure Mother    Heart attack Father    CAD Neg Hx     Social History:  reports that she has never smoked. She has never used smokeless tobacco. She reports that she does not drink alcohol and does not use drugs.  ROS: A complete review of systems was performed.  All systems are negative except for pertinent findings as noted.  Physical Exam:  Vital signs in last 24 hours: @VSRANGES @ General:  Alert and oriented, No acute distress HEENT: Normocephalic, atraumatic Neck: No JVD or lymphadenopathy Cardiovascular: Regular rate  Lungs: Normal inspiratory/expiratory excursion Extremities: No edema Neurologic: Grossly intact  I have reviewed prior pt notes  I have reviewed notes from referring/previous physicians--Dr. McKenzie's notes reviewed  I have reviewed urinalysis results--clear today  I have independently reviewed prior imaging--I reviewed CT images from September of last year.  No remaining renal calculi  I have reviewed prior urine culture   Impression/Assessment:  History of calculous disease, with no remaining stones at last scan in September of last year  Overactive bladder, quite bothersome to the patient  Plan:  1.  I gave her overactive bladder guide sheet  2.  She does not need further imaging for urolithiasis  3.  I will have her come back in about 3 months to go over her bladder function.  October Vermon Grays 11/21/2021, 1:30 PM  01/21/2022. Denis Carreon MD

## 2021-11-27 DIAGNOSIS — M545 Low back pain, unspecified: Secondary | ICD-10-CM | POA: Diagnosis not present

## 2021-11-27 DIAGNOSIS — G8929 Other chronic pain: Secondary | ICD-10-CM | POA: Diagnosis not present

## 2021-11-27 DIAGNOSIS — Z9181 History of falling: Secondary | ICD-10-CM | POA: Diagnosis not present

## 2021-11-28 DIAGNOSIS — I110 Hypertensive heart disease with heart failure: Secondary | ICD-10-CM | POA: Diagnosis not present

## 2021-11-28 DIAGNOSIS — R0781 Pleurodynia: Secondary | ICD-10-CM | POA: Diagnosis not present

## 2021-11-28 DIAGNOSIS — I509 Heart failure, unspecified: Secondary | ICD-10-CM | POA: Diagnosis not present

## 2021-11-28 DIAGNOSIS — R296 Repeated falls: Secondary | ICD-10-CM | POA: Diagnosis not present

## 2021-11-29 DIAGNOSIS — R7303 Prediabetes: Secondary | ICD-10-CM | POA: Diagnosis not present

## 2021-11-29 DIAGNOSIS — I130 Hypertensive heart and chronic kidney disease with heart failure and stage 1 through stage 4 chronic kidney disease, or unspecified chronic kidney disease: Secondary | ICD-10-CM | POA: Diagnosis not present

## 2021-11-29 DIAGNOSIS — R296 Repeated falls: Secondary | ICD-10-CM | POA: Diagnosis not present

## 2021-11-29 DIAGNOSIS — Z79891 Long term (current) use of opiate analgesic: Secondary | ICD-10-CM | POA: Diagnosis not present

## 2021-11-29 DIAGNOSIS — I4891 Unspecified atrial fibrillation: Secondary | ICD-10-CM | POA: Diagnosis not present

## 2021-11-29 DIAGNOSIS — I509 Heart failure, unspecified: Secondary | ICD-10-CM | POA: Diagnosis not present

## 2021-11-29 DIAGNOSIS — R54 Age-related physical debility: Secondary | ICD-10-CM | POA: Diagnosis not present

## 2021-11-29 DIAGNOSIS — J90 Pleural effusion, not elsewhere classified: Secondary | ICD-10-CM | POA: Diagnosis not present

## 2021-11-29 DIAGNOSIS — E782 Mixed hyperlipidemia: Secondary | ICD-10-CM | POA: Diagnosis not present

## 2021-11-29 DIAGNOSIS — I517 Cardiomegaly: Secondary | ICD-10-CM | POA: Diagnosis not present

## 2021-11-29 DIAGNOSIS — N189 Chronic kidney disease, unspecified: Secondary | ICD-10-CM | POA: Diagnosis not present

## 2021-11-29 DIAGNOSIS — K219 Gastro-esophageal reflux disease without esophagitis: Secondary | ICD-10-CM | POA: Diagnosis not present

## 2021-11-29 DIAGNOSIS — Z9181 History of falling: Secondary | ICD-10-CM | POA: Diagnosis not present

## 2021-11-29 DIAGNOSIS — Z7901 Long term (current) use of anticoagulants: Secondary | ICD-10-CM | POA: Diagnosis not present

## 2021-12-05 DIAGNOSIS — I110 Hypertensive heart disease with heart failure: Secondary | ICD-10-CM | POA: Diagnosis not present

## 2021-12-05 DIAGNOSIS — R0781 Pleurodynia: Secondary | ICD-10-CM | POA: Diagnosis not present

## 2021-12-05 DIAGNOSIS — E611 Iron deficiency: Secondary | ICD-10-CM | POA: Diagnosis not present

## 2021-12-05 DIAGNOSIS — I509 Heart failure, unspecified: Secondary | ICD-10-CM | POA: Diagnosis not present

## 2021-12-05 DIAGNOSIS — M2550 Pain in unspecified joint: Secondary | ICD-10-CM | POA: Diagnosis not present

## 2021-12-05 DIAGNOSIS — I129 Hypertensive chronic kidney disease with stage 1 through stage 4 chronic kidney disease, or unspecified chronic kidney disease: Secondary | ICD-10-CM | POA: Diagnosis not present

## 2021-12-05 DIAGNOSIS — E782 Mixed hyperlipidemia: Secondary | ICD-10-CM | POA: Diagnosis not present

## 2021-12-05 DIAGNOSIS — R296 Repeated falls: Secondary | ICD-10-CM | POA: Diagnosis not present

## 2021-12-12 DIAGNOSIS — R54 Age-related physical debility: Secondary | ICD-10-CM | POA: Diagnosis not present

## 2021-12-12 DIAGNOSIS — R0781 Pleurodynia: Secondary | ICD-10-CM | POA: Diagnosis not present

## 2021-12-13 DIAGNOSIS — R7303 Prediabetes: Secondary | ICD-10-CM | POA: Diagnosis not present

## 2021-12-13 DIAGNOSIS — I509 Heart failure, unspecified: Secondary | ICD-10-CM | POA: Diagnosis not present

## 2021-12-13 DIAGNOSIS — R54 Age-related physical debility: Secondary | ICD-10-CM | POA: Diagnosis not present

## 2021-12-13 DIAGNOSIS — K219 Gastro-esophageal reflux disease without esophagitis: Secondary | ICD-10-CM | POA: Diagnosis not present

## 2021-12-13 DIAGNOSIS — E782 Mixed hyperlipidemia: Secondary | ICD-10-CM | POA: Diagnosis not present

## 2021-12-13 DIAGNOSIS — I130 Hypertensive heart and chronic kidney disease with heart failure and stage 1 through stage 4 chronic kidney disease, or unspecified chronic kidney disease: Secondary | ICD-10-CM | POA: Diagnosis not present

## 2021-12-13 DIAGNOSIS — Z9181 History of falling: Secondary | ICD-10-CM | POA: Diagnosis not present

## 2021-12-13 DIAGNOSIS — I4891 Unspecified atrial fibrillation: Secondary | ICD-10-CM | POA: Diagnosis not present

## 2021-12-13 DIAGNOSIS — N189 Chronic kidney disease, unspecified: Secondary | ICD-10-CM | POA: Diagnosis not present

## 2021-12-13 DIAGNOSIS — Z79891 Long term (current) use of opiate analgesic: Secondary | ICD-10-CM | POA: Diagnosis not present

## 2021-12-13 DIAGNOSIS — Z7901 Long term (current) use of anticoagulants: Secondary | ICD-10-CM | POA: Diagnosis not present

## 2021-12-13 DIAGNOSIS — R296 Repeated falls: Secondary | ICD-10-CM | POA: Diagnosis not present

## 2021-12-19 DIAGNOSIS — R54 Age-related physical debility: Secondary | ICD-10-CM | POA: Diagnosis not present

## 2021-12-19 DIAGNOSIS — R296 Repeated falls: Secondary | ICD-10-CM | POA: Diagnosis not present

## 2021-12-19 DIAGNOSIS — R0781 Pleurodynia: Secondary | ICD-10-CM | POA: Diagnosis not present

## 2021-12-19 DIAGNOSIS — J90 Pleural effusion, not elsewhere classified: Secondary | ICD-10-CM | POA: Diagnosis not present

## 2021-12-20 DIAGNOSIS — J90 Pleural effusion, not elsewhere classified: Secondary | ICD-10-CM | POA: Diagnosis not present

## 2021-12-21 DIAGNOSIS — R54 Age-related physical debility: Secondary | ICD-10-CM | POA: Diagnosis not present

## 2021-12-21 DIAGNOSIS — I509 Heart failure, unspecified: Secondary | ICD-10-CM | POA: Diagnosis not present

## 2021-12-21 DIAGNOSIS — N189 Chronic kidney disease, unspecified: Secondary | ICD-10-CM | POA: Diagnosis not present

## 2021-12-21 DIAGNOSIS — G8929 Other chronic pain: Secondary | ICD-10-CM | POA: Diagnosis not present

## 2021-12-21 DIAGNOSIS — E782 Mixed hyperlipidemia: Secondary | ICD-10-CM | POA: Diagnosis not present

## 2021-12-21 DIAGNOSIS — I130 Hypertensive heart and chronic kidney disease with heart failure and stage 1 through stage 4 chronic kidney disease, or unspecified chronic kidney disease: Secondary | ICD-10-CM | POA: Diagnosis not present

## 2021-12-21 DIAGNOSIS — I4891 Unspecified atrial fibrillation: Secondary | ICD-10-CM | POA: Diagnosis not present

## 2021-12-26 DIAGNOSIS — Z7901 Long term (current) use of anticoagulants: Secondary | ICD-10-CM | POA: Diagnosis not present

## 2021-12-26 DIAGNOSIS — K219 Gastro-esophageal reflux disease without esophagitis: Secondary | ICD-10-CM | POA: Diagnosis not present

## 2021-12-26 DIAGNOSIS — N189 Chronic kidney disease, unspecified: Secondary | ICD-10-CM | POA: Diagnosis not present

## 2021-12-26 DIAGNOSIS — E782 Mixed hyperlipidemia: Secondary | ICD-10-CM | POA: Diagnosis not present

## 2021-12-26 DIAGNOSIS — R54 Age-related physical debility: Secondary | ICD-10-CM | POA: Diagnosis not present

## 2021-12-26 DIAGNOSIS — Z9181 History of falling: Secondary | ICD-10-CM | POA: Diagnosis not present

## 2021-12-26 DIAGNOSIS — I509 Heart failure, unspecified: Secondary | ICD-10-CM | POA: Diagnosis not present

## 2021-12-26 DIAGNOSIS — R7303 Prediabetes: Secondary | ICD-10-CM | POA: Diagnosis not present

## 2021-12-26 DIAGNOSIS — R296 Repeated falls: Secondary | ICD-10-CM | POA: Diagnosis not present

## 2021-12-26 DIAGNOSIS — Z79891 Long term (current) use of opiate analgesic: Secondary | ICD-10-CM | POA: Diagnosis not present

## 2021-12-26 DIAGNOSIS — I4891 Unspecified atrial fibrillation: Secondary | ICD-10-CM | POA: Diagnosis not present

## 2021-12-26 DIAGNOSIS — I130 Hypertensive heart and chronic kidney disease with heart failure and stage 1 through stage 4 chronic kidney disease, or unspecified chronic kidney disease: Secondary | ICD-10-CM | POA: Diagnosis not present

## 2022-01-02 DIAGNOSIS — R54 Age-related physical debility: Secondary | ICD-10-CM | POA: Diagnosis not present

## 2022-01-02 DIAGNOSIS — R296 Repeated falls: Secondary | ICD-10-CM | POA: Diagnosis not present

## 2022-01-18 DIAGNOSIS — G8929 Other chronic pain: Secondary | ICD-10-CM | POA: Diagnosis not present

## 2022-01-18 DIAGNOSIS — I130 Hypertensive heart and chronic kidney disease with heart failure and stage 1 through stage 4 chronic kidney disease, or unspecified chronic kidney disease: Secondary | ICD-10-CM | POA: Diagnosis not present

## 2022-01-18 DIAGNOSIS — I4891 Unspecified atrial fibrillation: Secondary | ICD-10-CM | POA: Diagnosis not present

## 2022-01-18 DIAGNOSIS — N189 Chronic kidney disease, unspecified: Secondary | ICD-10-CM | POA: Diagnosis not present

## 2022-01-18 DIAGNOSIS — R54 Age-related physical debility: Secondary | ICD-10-CM | POA: Diagnosis not present

## 2022-01-18 DIAGNOSIS — E782 Mixed hyperlipidemia: Secondary | ICD-10-CM | POA: Diagnosis not present

## 2022-01-18 DIAGNOSIS — I509 Heart failure, unspecified: Secondary | ICD-10-CM | POA: Diagnosis not present

## 2022-01-30 ENCOUNTER — Other Ambulatory Visit: Payer: Self-pay | Admitting: Thoracic Surgery (Cardiothoracic Vascular Surgery)

## 2022-01-30 DIAGNOSIS — R54 Age-related physical debility: Secondary | ICD-10-CM | POA: Diagnosis not present

## 2022-01-30 DIAGNOSIS — W262XXA Contact with edge of stiff paper, initial encounter: Secondary | ICD-10-CM | POA: Diagnosis not present

## 2022-01-30 DIAGNOSIS — R0781 Pleurodynia: Secondary | ICD-10-CM | POA: Diagnosis not present

## 2022-01-30 DIAGNOSIS — Z23 Encounter for immunization: Secondary | ICD-10-CM | POA: Diagnosis not present

## 2022-01-30 DIAGNOSIS — I7121 Aneurysm of the ascending aorta, without rupture: Secondary | ICD-10-CM

## 2022-01-30 DIAGNOSIS — R296 Repeated falls: Secondary | ICD-10-CM | POA: Diagnosis not present

## 2022-01-30 DIAGNOSIS — S61012A Laceration without foreign body of left thumb without damage to nail, initial encounter: Secondary | ICD-10-CM | POA: Diagnosis not present

## 2022-01-30 NOTE — Progress Notes (Unsigned)
CT A CHEST 

## 2022-02-19 DIAGNOSIS — I739 Peripheral vascular disease, unspecified: Secondary | ICD-10-CM | POA: Diagnosis not present

## 2022-02-19 DIAGNOSIS — B351 Tinea unguium: Secondary | ICD-10-CM | POA: Diagnosis not present

## 2022-02-19 DIAGNOSIS — L851 Acquired keratosis [keratoderma] palmaris et plantaris: Secondary | ICD-10-CM | POA: Diagnosis not present

## 2022-02-19 DIAGNOSIS — M79675 Pain in left toe(s): Secondary | ICD-10-CM | POA: Diagnosis not present

## 2022-02-19 DIAGNOSIS — M79674 Pain in right toe(s): Secondary | ICD-10-CM | POA: Diagnosis not present

## 2022-02-20 DIAGNOSIS — I509 Heart failure, unspecified: Secondary | ICD-10-CM | POA: Diagnosis not present

## 2022-02-20 DIAGNOSIS — R54 Age-related physical debility: Secondary | ICD-10-CM | POA: Diagnosis not present

## 2022-02-20 DIAGNOSIS — I130 Hypertensive heart and chronic kidney disease with heart failure and stage 1 through stage 4 chronic kidney disease, or unspecified chronic kidney disease: Secondary | ICD-10-CM | POA: Diagnosis not present

## 2022-02-20 DIAGNOSIS — N189 Chronic kidney disease, unspecified: Secondary | ICD-10-CM | POA: Diagnosis not present

## 2022-02-20 DIAGNOSIS — G8929 Other chronic pain: Secondary | ICD-10-CM | POA: Diagnosis not present

## 2022-02-20 DIAGNOSIS — E782 Mixed hyperlipidemia: Secondary | ICD-10-CM | POA: Diagnosis not present

## 2022-02-20 DIAGNOSIS — I4891 Unspecified atrial fibrillation: Secondary | ICD-10-CM | POA: Diagnosis not present

## 2022-02-27 ENCOUNTER — Encounter: Payer: Self-pay | Admitting: Urology

## 2022-02-27 ENCOUNTER — Ambulatory Visit: Payer: PPO | Admitting: Urology

## 2022-02-27 VITALS — BP 158/88 | HR 66 | Wt 144.0 lb

## 2022-02-27 DIAGNOSIS — Z87442 Personal history of urinary calculi: Secondary | ICD-10-CM

## 2022-02-27 DIAGNOSIS — R3915 Urgency of urination: Secondary | ICD-10-CM

## 2022-02-27 DIAGNOSIS — E782 Mixed hyperlipidemia: Secondary | ICD-10-CM | POA: Diagnosis not present

## 2022-02-27 DIAGNOSIS — N3281 Overactive bladder: Secondary | ICD-10-CM | POA: Diagnosis not present

## 2022-02-27 DIAGNOSIS — R54 Age-related physical debility: Secondary | ICD-10-CM | POA: Diagnosis not present

## 2022-02-27 DIAGNOSIS — R531 Weakness: Secondary | ICD-10-CM | POA: Diagnosis not present

## 2022-02-27 DIAGNOSIS — I509 Heart failure, unspecified: Secondary | ICD-10-CM | POA: Diagnosis not present

## 2022-02-27 DIAGNOSIS — R350 Frequency of micturition: Secondary | ICD-10-CM | POA: Diagnosis not present

## 2022-02-27 DIAGNOSIS — K219 Gastro-esophageal reflux disease without esophagitis: Secondary | ICD-10-CM | POA: Diagnosis not present

## 2022-02-27 DIAGNOSIS — I110 Hypertensive heart disease with heart failure: Secondary | ICD-10-CM | POA: Diagnosis not present

## 2022-02-27 DIAGNOSIS — R32 Unspecified urinary incontinence: Secondary | ICD-10-CM

## 2022-02-27 NOTE — Progress Notes (Signed)
History of Present Illness: [name] is a 88 y.o. year old female  here for follow-up of urolithiasis as well as overactive bladder.  She had lithotripsy in 2018 of a right sided stone. She had urgent stenting on 02 January 2021, and subsequent ureteroscopic management on the 29th.  11.7.2023: Here for follow-up.  At her last visit she complained of overactive bladder.  She was given a behavioral modification sheet.  She is down from 2-3 pads a day to 1 pad a day.  Overall she is happy with her progress.    Past Medical History:  Diagnosis Date   Anemia    Ascending aortic aneurysm (HCC)    a. 4.6cm by CT 07/2016.   Chronic diastolic CHF (congestive heart failure) (HCC)    Coronary artery calcification seen on CT scan 07/2016   History of blood transfusion    History of kidney stones    Hypertension    Hypomagnesemia    PAF (paroxysmal atrial fibrillation) (HCC)    a. dx during adm 08/2016.   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   Negative Jak2, BCR/ABL, normal epo level on 02/01/2014   Sepsis (HCC) 08/2016   Thrombocytopenia (HCC)    Trigeminal neuralgia     Past Surgical History:  Procedure Laterality Date   BREAST CYST EXCISION  60 yrs ago   COLONOSCOPY WITH ESOPHAGOGASTRODUODENOSCOPY (EGD) N/A 03/10/2013   Rehman: normal except few diverticula in simoid   CYSTOSCOPY W/ URETERAL STENT PLACEMENT Right 01/02/2021   Procedure: CYSTOSCOPY WITH RETROGRADE PYELOGRAM/URETERAL STENT PLACEMENT;  Surgeon: Malen Gauze, MD;  Location: [facility];  Service: Urology;  Laterality: Right;   CYSTOSCOPY WITH RETROGRADE PYELOGRAM, URETEROSCOPY AND STENT PLACEMENT Right 01/19/2021   Procedure: CYSTOSCOPY WITH RETROGRADE PYELOGRAM, URETEROSCOPY AND STENT EXCHANGE;  Surgeon: Malen Gauze, MD;  [facility]: [facility];  Service: Urology;  Laterality: Right;   ESOPHAGEAL DILATION N/A 03/08/2021   Procedure: ESOPHAGEAL DILATION;  Surgeon: Malissa Hippo, MD;  [facility];   Service: Endoscopy;  Laterality: N/A;   ESOPHAGOGASTRODUODENOSCOPY  2014   Erosive reflux esophagitis with stricture at GE junction which was dilated with a balloon dilator to 18 mm. Moderate size sliding hiatal hernia. small ulcer at gastric body along with antral erosions   ESOPHAGOGASTRODUODENOSCOPY (EGD) WITH PROPOFOL N/A 03/08/2021   Procedure: ESOPHAGOGASTRODUODENOSCOPY (EGD) WITH PROPOFOL;  Surgeon: Malissa Hippo, MD;  [facility];  Service: Endoscopy;  Laterality: N/A;  9:20   EXTRACORPOREAL SHOCK WAVE LITHOTRIPSY Right 09/20/2016   Procedure: RIGHT EXTRACORPOREAL SHOCK WAVE LITHOTRIPSY (ESWL);  Surgeon: Alfredo Martinez, MD;  [facility];  Service: Urology;  Laterality: Right;   EXTRACORPOREAL SHOCK WAVE LITHOTRIPSY Right 02/16/2019   Procedure: EXTRACORPOREAL SHOCK WAVE LITHOTRIPSY (ESWL);  Surgeon: Marcine Matar, MD;  [facility];  Service: Urology;  Laterality: Right;   EXTRACORPOREAL SHOCK WAVE LITHOTRIPSY Right 04/27/2019   Procedure: EXTRACORPOREAL SHOCK WAVE LITHOTRIPSY (ESWL);  Surgeon: Marcine Matar, MD;  [facility];  Service: Urology;  Laterality: Right;   EYE SURGERY     cataract surgery bilat    Gamma Knife     Trigeminal    HOLMIUM LASER APPLICATION Right 01/19/2021   Procedure: HOLMIUM LASER APPLICATION;  Surgeon: Malen Gauze, MD;  Location: [facility];  Service: Urology;  Laterality: Right;   SLT LASER APPLICATION Left 09/27/2014   Procedure: SLT LASER APPLICATION;  Surgeon: Susa Simmonds, MD;  Location: [facility];  Service: Ophthalmology;  Laterality: Left;    Home  Medications:  (Not in a hospital admission)   Allergies:  Allergies  Allergen Reactions   Sulfa Antibiotics Other (See Comments)    Pt states that this med makes her feel crazy.      Family History  Problem Relation Age of Onset   Congestive Heart Failure Mother    Heart attack Father    CAD Neg Hx     Social History:  reports that she has never  smoked. She has never used smokeless tobacco. She reports that she does not drink alcohol and does not use drugs.  ROS: A complete review of systems was performed.  All systems are negative except for pertinent findings as noted.  Physical Exam:  Vital signs in last 24 hours: @VSRANGES @ General:  Alert and oriented, No acute distress HEENT: Normocephalic, atraumatic Neck: No JVD or lymphadenopathy Cardiovascular: Regular rate  Lungs: Normal inspiratory/expiratory excursion Abdomen: Soft, nontender, nondistended, no abdominal masses Back: No CVA tenderness Extremities: No edema Neurologic: Grossly intact   I have reviewed urinalysis results  I have independently reviewed prior imaging--I reviewed her most recent CT scan from 2022.  She had no remaining renal calculi.    Impression/Assessment:  1.  History of nephrolithiasis, no evidence of stones remaining on last CT just over a year ago  2.  Overactive bladder-symptoms improved with behavioral modification  Plan:  I will have her come back on an as-needed basis  Jorja Loa 02/27/2022, 8:50 AM  Lillette Boxer. Tenicia Gural MD

## 2022-02-28 LAB — URINALYSIS, ROUTINE W REFLEX MICROSCOPIC
Bilirubin, UA: NEGATIVE
Glucose, UA: NEGATIVE
Ketones, UA: NEGATIVE
Nitrite, UA: NEGATIVE
Protein,UA: NEGATIVE
Specific Gravity, UA: 1.025 (ref 1.005–1.030)
Urobilinogen, Ur: 0.2 mg/dL (ref 0.2–1.0)
pH, UA: 5.5 (ref 5.0–7.5)

## 2022-02-28 LAB — MICROSCOPIC EXAMINATION: Bacteria, UA: NONE SEEN

## 2022-03-20 ENCOUNTER — Ambulatory Visit: Payer: PPO | Admitting: Thoracic Surgery (Cardiothoracic Vascular Surgery)

## 2022-03-20 ENCOUNTER — Ambulatory Visit
Admission: RE | Admit: 2022-03-20 | Discharge: 2022-03-20 | Disposition: A | Discharge status: Home / Self Care | Payer: PPO | Source: Ambulatory Visit | Attending: Thoracic Surgery (Cardiothoracic Vascular Surgery) | Admitting: Thoracic Surgery (Cardiothoracic Vascular Surgery)

## 2022-03-20 ENCOUNTER — Encounter: Payer: Self-pay | Admitting: Thoracic Surgery (Cardiothoracic Vascular Surgery)

## 2022-03-20 VITALS — BP 162/88 | HR 63 | Resp 20 | Ht 62.0 in | Wt 144.0 lb

## 2022-03-20 DIAGNOSIS — I7121 Aneurysm of the ascending aorta, without rupture: Secondary | ICD-10-CM

## 2022-03-20 DIAGNOSIS — I718 Aortic aneurysm of unspecified site, ruptured: Secondary | ICD-10-CM | POA: Diagnosis not present

## 2022-03-20 DIAGNOSIS — J9811 Atelectasis: Secondary | ICD-10-CM | POA: Diagnosis not present

## 2022-03-20 MED ORDER — IOPAMIDOL (ISOVUE-370) INJECTION 76%
75.0000 mL | Freq: Once | INTRAVENOUS | Status: AC | PRN
  Administered 2022-03-20: 75 mL via INTRAVENOUS

## 2022-03-20 MED ORDER — METOPROLOL SUCCINATE ER 50 MG PO TB24: 50.0000 mg | ORAL_TABLET | Freq: Two times a day (BID) | ORAL | 6 refills | Status: DC

## 2022-03-20 NOTE — Progress Notes (Signed)
[address]       [name] [address]             [phone]    HPI: [name] returns for follow-up of her ascending and arch aneurysm.  [name] is a 88 year old woman with a history of coronary disease, chronic diastolic congestive heart failure, polycythemia, hypertension, paroxysmal atrial fibrillation, trigeminal neuralgia, thrombocytopenia, nephrolithiasis, and ascending thoracic and arch aneurysm.  Her aneurysm has been followed since 2018.  It was 4.6 cm at that time.  I most recently saw her in November 2022 and the aneurysm had increased in size to about 5.6 cm.  She was not interested in having surgery and is not a surgical candidate but she did wish to continue to have follow-up.  She is not having any chest pain, pressure, or tightness.  She is not particularly active.  She says that her blood pressure has been elevated recently.  Past Medical History:  Diagnosis Date   Anemia    Ascending aortic aneurysm (HCC)    a. 4.6cm by CT 07/2016.   Chronic diastolic CHF (congestive heart failure) (HCC)    Coronary artery calcification seen on CT scan 07/2016   History of blood transfusion    History of kidney stones    Hypertension    Hypomagnesemia    PAF (paroxysmal atrial fibrillation) (HCC)    a. dx during adm 08/2016.   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   Negative Jak2, BCR/ABL, normal epo level on 02/01/2014   Sepsis (HCC) 08/2016   Thrombocytopenia (HCC)    Trigeminal neuralgia     Current Outpatient Medications  Medication Sig Dispense Refill   amiodarone (PACERONE) 200 MG tablet TAKE (1) TABLET BY MOUTH DAILY. 30 tablet 0   apixaban (ELIQUIS) 5 MG TABS tablet TAKE (1) TABLET BY MOUTH TWICE DAILY. 180 tablet 0   Calcium Carbonate-Vitamin D 600-400 MG-UNIT tablet Take 1 tablet by mouth daily.     Cinnamon 500 MG capsule Take 1,000 mg by mouth every other day.     Coenzyme Q10 (COQ10) 100 MG CAPS Take 200 mg by mouth daily.       furosemide (LASIX) 20 MG tablet Take 1 tablet (20 mg total) by mouth daily as needed. (Patient taking differently: Take 40 mg by mouth daily.) 90 tablet 3   gabapentin (NEURONTIN) 300 MG capsule Take 300 mg by mouth every other day.     Multiple Vitamin (MULTIVITAMIN WITH MINERALS) TABS tablet Take 1 tablet by mouth daily.     potassium chloride SA (KLOR-CON) 20 MEQ tablet Take 1 tablet (20 mEq total) by mouth daily. 30 tablet 1   pravastatin (PRAVACHOL) 40 MG tablet Take 40 mg by mouth at bedtime.      Specialty Vitamins Products (BIOTIN PLUS KERATIN PO) Take 250 mg by mouth.     metoprolol succinate (TOPROL-XL) 50 MG 24 hr tablet Take 1 tablet (50 mg total) by mouth in the morning and at bedtime. 60 tablet 6   No current facility-administered medications for this visit.    Physical Exam BP (!) 162/88 (BP Location: Right Arm, Patient Position: Sitting, Cuff Size: Normal)   Pulse 63   Resp 20   Ht 5\' 2"  (1.575 m)   Wt 144 lb (65.3 kg)   SpO2 94% Comment: RA  BMI 26.34 kg/m  Elderly, frail-appearing woman in no acute distress Alert and oriented x3 with no focal motor deficit Cardiac regular rate and rhythm with a  prominent diastolic murmur Lungs clear bilaterally Edema in both ankles  Diagnostic Tests: CT ANGIOGRAPHY CHEST WITH CONTRAST   TECHNIQUE: Multidetector CT imaging of the chest was performed using the standard protocol during bolus administration of intravenous contrast. Multiplanar CT image reconstructions and MIPs were obtained to evaluate the vascular anatomy.   RADIATION DOSE REDUCTION: This exam was performed according to the departmental dose-optimization program which includes automated exposure control, adjustment of the mA and/or kV according to patient size and/or use of iterative reconstruction technique.   Creatinine was obtained on site at [facility] at [address]   Results: Creatinine 1.0 mg/dL.   CONTRAST:  27mL ISOVUE-370  IOPAMIDOL (ISOVUE-370) INJECTION 76%   COMPARISON:  March 21, 2021.   FINDINGS: Cardiovascular: Calcified aortic atherosclerotic changes. Noncalcified aortic atherosclerotic plaque. Dilation of the ascending thoracic aorta it to 5.6 x 5.6 cm in the axial plane, when measured in a similar fashion on the prior study from outside to outside of the wall of the thoracic aorta the aorta measured 5.4 x 5.5 cm.   Descending thoracic aorta is tortuous measuring 3.2 cm, stable caliber.   Standard three-vessel branching pattern in the chest. No acute aortic process.   As measured in the coronal plane the ascending thoracic aorta shows a diameter of 5.6 as compared to 5.4 cm maximal caliber.   Heart size is stable and mildly enlarged.  No pericardial effusion.   Central pulmonary vasculature is of normal caliber.   Mediastinum/Nodes: No adenopathy in the chest. Large hiatal hernia with slightly greater than 50% of the stomach herniated into the chest. Mildly patulous esophagus similar to prior imaging.   Lungs/Pleura: Basilar atelectasis. Pleural and parenchymal scarring in the chest. No sign of pleural effusion. No lobar consolidation. Mild subpleural reticulation, a stable finding.   Upper Abdomen: Incidental imaging of upper abdominal contents without acute process   Musculoskeletal: No chest wall abnormality. No acute or significant osseous findings.   Review of the MIP images confirms the above findings.   IMPRESSION: 1. Aortic aneurysm, slightly increased compared to previous imaging from November of 2022. Recommend semi-annual imaging followup by CTA or MRA and referral to cardiothoracic surgery if not already obtained. This recommendation follows 2010 ACCF/AHA/AATS/ACR/ASA/SCA/SCAI/SIR/STS/SVM Guidelines for the Diagnosis and Management of Patients With Thoracic Aortic Disease. Circulation. 2010; 121: E266-e369TAA. Aortic aneurysm NOS (ICD10-I71.9) 2. Large hiatal  hernia with slightly greater than 50% of the stomach herniated into the chest. 3. Aortic atherosclerosis.   Aortic Atherosclerosis (ICD10-I70.0).     Electronically Signed   By: Zetta Bills M.D.   On: 03/20/2022 11:43 I personally reviewed the CT images.  There is a 5.6 cm ascending aneurysm with minimal if any change from a year ago.  Involves ascending aorta and arch.  Thoracic aortic atherosclerosis.  Large hiatal hernia.  Impression: [name] is a 88 year old woman with a history of coronary disease, chronic diastolic congestive heart failure, polycythemia, hypertension, paroxysmal atrial fibrillation, trigeminal neuralgia, thrombocytopenia, nephrolithiasis, and ascending thoracic and arch aneurysm.  Ascending and arch thoracic aneurysm and thoracic aortic atherosclerosis- aneurysm is stable at 5.6 cm.  She is not interested in surgery and also is not a candidate for surgery either electively or emergently.  She still wishes to have follow-up on her aneurysm.  Guidelines would normally indicate 6 months but since we are not going to operate anyway we will plan to repeat a CT in 1 year.  Hypertension-blood pressure elevated at 0000000 systolic.  Currently on  metoprolol 25 mg p.o. twice daily.  Will change to 50 mg p.o. twice daily.  I sent a prescription to [facility].  Thoracic aortic atherosclerosis-she is on pravastatin.  Plan: Increase metoprolol to 50 mg twice daily Return in 1 year with CT chest for follow-up of aneurysm  Melrose Nakayama, MD [facility] [phone]

## 2022-03-27 DIAGNOSIS — J3489 Other specified disorders of nose and nasal sinuses: Secondary | ICD-10-CM | POA: Diagnosis not present

## 2022-03-27 DIAGNOSIS — I7121 Aneurysm of the ascending aorta, without rupture: Secondary | ICD-10-CM | POA: Diagnosis not present

## 2022-03-27 DIAGNOSIS — Z87442 Personal history of urinary calculi: Secondary | ICD-10-CM | POA: Diagnosis not present

## 2022-03-27 DIAGNOSIS — I10 Essential (primary) hypertension: Secondary | ICD-10-CM | POA: Diagnosis not present

## 2022-03-27 DIAGNOSIS — R54 Age-related physical debility: Secondary | ICD-10-CM | POA: Diagnosis not present

## 2022-04-19 DIAGNOSIS — I509 Heart failure, unspecified: Secondary | ICD-10-CM | POA: Diagnosis not present

## 2022-04-19 DIAGNOSIS — E782 Mixed hyperlipidemia: Secondary | ICD-10-CM | POA: Diagnosis not present

## 2022-04-19 DIAGNOSIS — G8929 Other chronic pain: Secondary | ICD-10-CM | POA: Diagnosis not present

## 2022-04-19 DIAGNOSIS — N189 Chronic kidney disease, unspecified: Secondary | ICD-10-CM | POA: Diagnosis not present

## 2022-04-19 DIAGNOSIS — I130 Hypertensive heart and chronic kidney disease with heart failure and stage 1 through stage 4 chronic kidney disease, or unspecified chronic kidney disease: Secondary | ICD-10-CM | POA: Diagnosis not present

## 2022-04-19 DIAGNOSIS — R54 Age-related physical debility: Secondary | ICD-10-CM | POA: Diagnosis not present

## 2022-04-19 DIAGNOSIS — I4891 Unspecified atrial fibrillation: Secondary | ICD-10-CM | POA: Diagnosis not present

## 2022-04-24 DIAGNOSIS — I4891 Unspecified atrial fibrillation: Secondary | ICD-10-CM | POA: Diagnosis not present

## 2022-04-24 DIAGNOSIS — Z7901 Long term (current) use of anticoagulants: Secondary | ICD-10-CM | POA: Diagnosis not present

## 2022-04-24 DIAGNOSIS — I110 Hypertensive heart disease with heart failure: Secondary | ICD-10-CM | POA: Diagnosis not present

## 2022-04-24 DIAGNOSIS — I509 Heart failure, unspecified: Secondary | ICD-10-CM | POA: Diagnosis not present

## 2022-04-24 DIAGNOSIS — E782 Mixed hyperlipidemia: Secondary | ICD-10-CM | POA: Diagnosis not present

## 2022-05-21 DIAGNOSIS — R54 Age-related physical debility: Secondary | ICD-10-CM | POA: Diagnosis not present

## 2022-05-21 DIAGNOSIS — N189 Chronic kidney disease, unspecified: Secondary | ICD-10-CM | POA: Diagnosis not present

## 2022-05-21 DIAGNOSIS — E782 Mixed hyperlipidemia: Secondary | ICD-10-CM | POA: Diagnosis not present

## 2022-05-21 DIAGNOSIS — I4891 Unspecified atrial fibrillation: Secondary | ICD-10-CM | POA: Diagnosis not present

## 2022-05-21 DIAGNOSIS — I130 Hypertensive heart and chronic kidney disease with heart failure and stage 1 through stage 4 chronic kidney disease, or unspecified chronic kidney disease: Secondary | ICD-10-CM | POA: Diagnosis not present

## 2022-05-21 DIAGNOSIS — G8929 Other chronic pain: Secondary | ICD-10-CM | POA: Diagnosis not present

## 2022-05-21 DIAGNOSIS — I509 Heart failure, unspecified: Secondary | ICD-10-CM | POA: Diagnosis not present

## 2022-05-22 DIAGNOSIS — J310 Chronic rhinitis: Secondary | ICD-10-CM | POA: Diagnosis not present

## 2022-05-22 DIAGNOSIS — K219 Gastro-esophageal reflux disease without esophagitis: Secondary | ICD-10-CM | POA: Diagnosis not present

## 2022-05-22 DIAGNOSIS — R54 Age-related physical debility: Secondary | ICD-10-CM | POA: Diagnosis not present

## 2022-05-22 DIAGNOSIS — E782 Mixed hyperlipidemia: Secondary | ICD-10-CM | POA: Diagnosis not present

## 2022-05-22 DIAGNOSIS — I509 Heart failure, unspecified: Secondary | ICD-10-CM | POA: Diagnosis not present

## 2022-05-22 DIAGNOSIS — I110 Hypertensive heart disease with heart failure: Secondary | ICD-10-CM | POA: Diagnosis not present

## 2022-06-11 DIAGNOSIS — M79674 Pain in right toe(s): Secondary | ICD-10-CM | POA: Diagnosis not present

## 2022-06-11 DIAGNOSIS — B351 Tinea unguium: Secondary | ICD-10-CM | POA: Diagnosis not present

## 2022-06-11 DIAGNOSIS — I739 Peripheral vascular disease, unspecified: Secondary | ICD-10-CM | POA: Diagnosis not present

## 2022-06-11 DIAGNOSIS — M79675 Pain in left toe(s): Secondary | ICD-10-CM | POA: Diagnosis not present

## 2022-06-11 DIAGNOSIS — L851 Acquired keratosis [keratoderma] palmaris et plantaris: Secondary | ICD-10-CM | POA: Diagnosis not present

## 2022-06-12 DIAGNOSIS — Z0001 Encounter for general adult medical examination with abnormal findings: Secondary | ICD-10-CM | POA: Diagnosis not present

## 2022-06-12 DIAGNOSIS — Z7189 Other specified counseling: Secondary | ICD-10-CM | POA: Diagnosis not present

## 2022-06-12 DIAGNOSIS — K219 Gastro-esophageal reflux disease without esophagitis: Secondary | ICD-10-CM | POA: Diagnosis not present

## 2022-06-12 DIAGNOSIS — I110 Hypertensive heart disease with heart failure: Secondary | ICD-10-CM | POA: Diagnosis not present

## 2022-06-12 DIAGNOSIS — I509 Heart failure, unspecified: Secondary | ICD-10-CM | POA: Diagnosis not present

## 2022-06-12 DIAGNOSIS — I4891 Unspecified atrial fibrillation: Secondary | ICD-10-CM | POA: Diagnosis not present

## 2022-07-10 DIAGNOSIS — H409 Unspecified glaucoma: Secondary | ICD-10-CM | POA: Diagnosis not present

## 2022-07-10 DIAGNOSIS — I4891 Unspecified atrial fibrillation: Secondary | ICD-10-CM | POA: Diagnosis not present

## 2022-07-10 DIAGNOSIS — I509 Heart failure, unspecified: Secondary | ICD-10-CM | POA: Diagnosis not present

## 2022-07-10 DIAGNOSIS — J310 Chronic rhinitis: Secondary | ICD-10-CM | POA: Diagnosis not present

## 2022-07-10 DIAGNOSIS — K219 Gastro-esophageal reflux disease without esophagitis: Secondary | ICD-10-CM | POA: Diagnosis not present

## 2022-07-10 DIAGNOSIS — G8929 Other chronic pain: Secondary | ICD-10-CM | POA: Diagnosis not present

## 2022-07-10 DIAGNOSIS — I110 Hypertensive heart disease with heart failure: Secondary | ICD-10-CM | POA: Diagnosis not present

## 2022-08-08 DIAGNOSIS — E782 Mixed hyperlipidemia: Secondary | ICD-10-CM | POA: Diagnosis not present

## 2022-08-08 DIAGNOSIS — G8929 Other chronic pain: Secondary | ICD-10-CM | POA: Diagnosis not present

## 2022-08-08 DIAGNOSIS — E611 Iron deficiency: Secondary | ICD-10-CM | POA: Diagnosis not present

## 2022-08-08 DIAGNOSIS — I509 Heart failure, unspecified: Secondary | ICD-10-CM | POA: Diagnosis not present

## 2022-08-08 DIAGNOSIS — N189 Chronic kidney disease, unspecified: Secondary | ICD-10-CM | POA: Diagnosis not present

## 2022-08-08 DIAGNOSIS — I130 Hypertensive heart and chronic kidney disease with heart failure and stage 1 through stage 4 chronic kidney disease, or unspecified chronic kidney disease: Secondary | ICD-10-CM | POA: Diagnosis not present

## 2022-08-08 DIAGNOSIS — I7122 Aneurysm of the aortic arch, without rupture: Secondary | ICD-10-CM | POA: Diagnosis not present

## 2022-08-13 ENCOUNTER — Encounter (INDEPENDENT_AMBULATORY_CARE_PROVIDER_SITE_OTHER): Payer: Self-pay | Admitting: Gastroenterology

## 2022-08-13 ENCOUNTER — Ambulatory Visit (INDEPENDENT_AMBULATORY_CARE_PROVIDER_SITE_OTHER): Payer: PPO | Admitting: Gastroenterology

## 2022-08-13 VITALS — BP 135/62 | HR 57 | Temp 97.7°F | Ht 62.0 in | Wt 141.5 lb

## 2022-08-13 DIAGNOSIS — K219 Gastro-esophageal reflux disease without esophagitis: Secondary | ICD-10-CM

## 2022-08-13 DIAGNOSIS — R09A2 Foreign body sensation, throat: Secondary | ICD-10-CM | POA: Diagnosis not present

## 2022-08-13 MED ORDER — PANTOPRAZOLE SODIUM 40 MG PO TBEC: 40.0000 mg | DELAYED_RELEASE_TABLET | Freq: Every day | ORAL | 3 refills | Status: DC

## 2022-08-13 NOTE — Patient Instructions (Signed)
I suspect the sensation of phlegm in your throat may be related to silent reflux, we will stop nexium (esomprazole) and start protonix  daily Be mindful of greasy, spicy, fried, citrus foods, caffeine, carbonated drinks, chocolate and alcohol can increase reflux symptoms Stay upright 2-3 hours after eating, prior to lying down and avoid eating late in the evenings.   Follow up 6 months

## 2022-08-13 NOTE — Progress Notes (Signed)
Referring Provider: [facility] Primary Care Physician:  [facility] Primary GI Physician: Levon Hedger   Chief Complaint  Patient presents with   Dysphagia    Follow up on dysphagia. Reports doing well and no longer having any trouble swallowing. Still feels like she has a lump in her throat.    HPI:   [name] is a 89 y.o. female with past medical history of  anemia, CHF, A fib, HTN, thrombocytopenia, renal calculi AAA.    Patient presenting today for follow up of dysphagia/GERD  Last seen April 2023, at that time swallowing is improved and she is doing well on esomeprazole once daily. She eats a lot of veggies. Daughter also makes her soups which she enjoys. appetite good. gained about 10 pounds since her last visit. Denies any issues with acid reflux. Denies any episodes of needing to cough foods back up  Recommended to continue with chewing precautions, continue nexium  daily,   Present:  States she is doing well. She has no heartburn or acid regurgitation. No issues with dysphagia. She continues to have some issues with feeling like she has phlegm in her throat. Denies cough or sore throat. Appetite is good. She has no abdominal pain. She notes some some occasional diarrhea with normal stools in between, no rectal bleeding, melena or weight loss. She notes this has been occurring for the past 2-3 months. Notes normal stools other times besides when diarrhea is occurring. No associated abdominal pain.   Last Colonoscopy:2014 Normal colonoscopy except few diverticula at sigmoid colon. Last Endoscopy:11/2022Normal hypopharynx. - Web in the proximal esophagus. It was disrupted by passing [address] [place] [address] dilator resulting in superficial mucosal disruption. dilator however could not be passed to full length. - Benign-appearing esophageal stenosis At the GE junction was dilated with balloon dilator from 15 to 18 mm. - 4 cm hiatal hernia. -  Erythematous mucosa in the antrum. - Normal duodenal bulb and second portion of the duodenum. - No specimens collected.   Past Medical History:  Diagnosis Date   Anemia    Ascending aortic aneurysm    a. 4.6cm by CT 07/2016.   Chronic diastolic CHF (congestive heart failure)    Coronary artery calcification seen on CT scan 07/2016   History of blood transfusion    History of kidney stones    Hypertension    Hypomagnesemia    PAF (paroxysmal atrial fibrillation)    a. dx during adm 08/2016.   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   Negative Jak2, BCR/ABL, normal epo level on 02/01/2014   Sepsis 08/2016   Thrombocytopenia    Trigeminal neuralgia     Past Surgical History:  Procedure Laterality Date   BREAST CYST EXCISION  60 yrs ago   COLONOSCOPY WITH ESOPHAGOGASTRODUODENOSCOPY (EGD) N/A 03/10/2013   Rehman: normal except few diverticula in simoid   CYSTOSCOPY W/ URETERAL STENT PLACEMENT Right 01/02/2021   Procedure: CYSTOSCOPY WITH RETROGRADE PYELOGRAM/URETERAL STENT PLACEMENT;  Surgeon: Malen Gauze, MD;  Location: [facility];  Service: Urology;  Laterality: Right;   CYSTOSCOPY WITH RETROGRADE PYELOGRAM, URETEROSCOPY AND STENT PLACEMENT Right 01/19/2021   Procedure: CYSTOSCOPY WITH RETROGRADE PYELOGRAM, URETEROSCOPY AND STENT EXCHANGE;  Surgeon: Malen Gauze, MD;  Location: [facility];  Service: Urology;  Laterality: Right;   ESOPHAGEAL DILATION N/A 03/08/2021   Procedure: ESOPHAGEAL DILATION;  Surgeon: Malissa Hippo, MD;  Location: [facility];  Service: Endoscopy;  Laterality: N/A;   ESOPHAGOGASTRODUODENOSCOPY  2014   Erosive reflux  esophagitis with stricture at GE junction which was dilated with a balloon dilator to 18 mm. Moderate size sliding hiatal hernia. small ulcer at gastric body along with antral erosions   ESOPHAGOGASTRODUODENOSCOPY (EGD) WITH PROPOFOL N/A 03/08/2021   Procedure: ESOPHAGOGASTRODUODENOSCOPY (EGD) WITH PROPOFOL;  Surgeon: Malissa Hippo, MD;   Location: [facility];  Service: Endoscopy;  Laterality: N/A;  9:20   EXTRACORPOREAL SHOCK WAVE LITHOTRIPSY Right 09/20/2016   Procedure: RIGHT EXTRACORPOREAL SHOCK WAVE LITHOTRIPSY (ESWL);  Surgeon: Alfredo Martinez, MD;  Location: [facility];  Service: Urology;  Laterality: Right;   EXTRACORPOREAL SHOCK WAVE LITHOTRIPSY Right 02/16/2019   Procedure: EXTRACORPOREAL SHOCK WAVE LITHOTRIPSY (ESWL);  Surgeon: Marcine Matar, MD;  Location: [facility];  Service: Urology;  Laterality: Right;   EXTRACORPOREAL SHOCK WAVE LITHOTRIPSY Right 04/27/2019   Procedure: EXTRACORPOREAL SHOCK WAVE LITHOTRIPSY (ESWL);  Surgeon: Marcine Matar, MD;  Location: [facility];  Service: Urology;  Laterality: Right;   EYE SURGERY     cataract surgery bilat    Gamma Knife     Trigeminal    HOLMIUM LASER APPLICATION Right 01/19/2021   Procedure: HOLMIUM LASER APPLICATION;  Surgeon: Malen Gauze, MD;  Location: [facility];  Service: Urology;  Laterality: Right;   SLT LASER APPLICATION Left 09/27/2014   Procedure: SLT LASER APPLICATION;  Surgeon: Susa Simmonds, MD;  Location: [facility];  Service: Ophthalmology;  Laterality: Left;    Current Outpatient Medications  Medication Sig Dispense Refill   amiodarone (PACERONE) 200 MG tablet TAKE (1) TABLET BY MOUTH DAILY. 30 tablet 0   apixaban (ELIQUIS) 5 MG TABS tablet TAKE (1) TABLET BY MOUTH TWICE DAILY. 180 tablet 0   Calcium Carbonate-Vitamin D 600-400 MG-UNIT tablet Take 1 tablet by mouth daily.     Cinnamon 500 MG capsule Take 1,000 mg by mouth every other day.     Coenzyme Q10 (COQ10) 100 MG CAPS Take 100 mg by mouth daily.     furosemide (LASIX) 20 MG tablet Take 1 tablet (20 mg total) by mouth daily as needed. (Patient taking differently: Take 40 mg by mouth daily.) 90 tablet 3   gabapentin (NEURONTIN) 300 MG capsule Take 300 mg by mouth. One every night     metoprolol succinate (TOPROL-XL) 50 MG 24 hr tablet Take 1 tablet (50 mg total) by mouth in the morning and  at bedtime. 60 tablet 6   Multiple Vitamin (MULTIVITAMIN WITH MINERALS) TABS tablet Take 1 tablet by mouth daily.     potassium chloride SA (KLOR-CON) 20 MEQ tablet Take 1 tablet (20 mEq total) by mouth daily. 30 tablet 1   pravastatin (PRAVACHOL) 40 MG tablet Take 40 mg by mouth at bedtime.      No current facility-administered medications for this visit.    Allergies as of 08/13/2022 - Review Complete 08/13/2022  Allergen Reaction Noted   Sulfa antibiotics Other (See Comments) 02/20/2013    Family History  Problem Relation Age of Onset   Congestive Heart Failure Mother    Heart attack Father    CAD Neg Hx     Social History   Socioeconomic History   Marital status: Widowed    Spouse name: Not on file   Number of children: Not on file   Years of education: Not on file   Highest education level: Not on file  Occupational History   Not on file  Tobacco Use   Smoking status: Never    Passive exposure: Never   Smokeless tobacco: Never  Vaping Use  Vaping Use: Never used  Substance and Sexual Activity   Alcohol use: No   Drug use: No   Sexual activity: Never  Other Topics Concern   Not on file  Social History Narrative   Not on file   Social Determinants of Health   Financial Resource Strain: Not on file  Food Insecurity: Not on file  Transportation Needs: Not on file  Physical Activity: Not on file  Stress: Not on file  Social Connections: Not on file   Review of systems General: negative for malaise, night sweats, fever, chills, weight loss Neck: Negative for lumps, goiter, pain and significant neck swelling Resp: Negative for cough, wheezing, dyspnea at rest CV: Negative for chest pain, leg swelling, palpitations, orthopnea GI: denies melena, hematochezia, nausea, vomiting, diarrhea, constipation, dysphagia, odyonophagia, early satiety or unintentional weight loss. +globus sensation  MSK: Negative for joint pain or swelling, back pain, and muscle  pain. Derm: Negative for itching or rash Psych: Denies depression, anxiety, memory loss, confusion. No homicidal or suicidal ideation.  Heme: Negative for prolonged bleeding, bruising easily, and swollen nodes. Endocrine: Negative for cold or heat intolerance, polyuria, polydipsia and goiter. Neuro: negative for tremor, gait imbalance, syncope and seizures. The remainder of the review of systems is noncontributory.  Physical Exam: BP 135/62 (BP Location: Left Arm, Patient Position: Sitting, Cuff Size: Large)   Pulse (!) 57   Temp 97.7 F (36.5 C) (Oral)   Ht  (1.575 m)   Wt 141 lb 8 oz (64.2 kg)   BMI 25.88 kg/m  General:   Alert and oriented. No distress noted. Pleasant and cooperative.  Head:  Normocephalic and atraumatic. Eyes:  Conjuctiva clear without scleral icterus. Mouth:  Oral mucosa pink and moist. Good dentition. No lesions. Heart: Normal rate and rhythm, s1 and s2 heart sounds present.  Lungs: Clear lung sounds in all lobes. Respirations equal and unlabored. Abdomen:  +BS, soft, non-tender and non-distended. No rebound or guarding. No HSM or masses noted. Derm: No palmar erythema or jaundice Msk:  Symmetrical without gross deformities. Normal posture. Extremities:  Without edema. Neurologic:  Alert and  oriented x4 Psych:  Alert and cooperative. Normal mood and affect.  Invalid input(s): "6 MONTHS"   ASSESSMENT: HECTOR VENNE is a 89 y.o. female presenting today for follow up of dysphagia/GERD and globus sensation.   She is not having issues with dysphagia, though notes continued globus sensation feeling that she needs to clear phlegm out of her throat. She is not having heartburn or obvious acid regurgitation. Symptoms could be secondary to silent reflux, she has been on nexium for some time. Will try changing up her PPI therapy to see if this resolves globus sensation. Should continue with good reflux precautions to include being mindful of greasy, spicy,  fried, citrus foods, caffeine, carbonated drinks, chocolate as these can increase reflux symptoms, Stay upright 2-3 hours after eating, prior to lying down and avoid eating late in the evenings.   PLAN:  Stop nexium  2. Start protonix  daily  3. Good reflux precautions   All questions were answered, patient verbalized understanding and is in agreement with plan as outlined above.   Follow Up: 6 months   Tysha Grismore L. Jeanmarie Hubert, MSN, APRN, AGNP-C Adult-Gerontology Nurse Practitioner [facility]  I have reviewed the note and agree with the APP's assessment as described in this progress note  Katrinka Blazing, MD Gastroenterology and Hepatology [facility]

## 2022-08-20 DIAGNOSIS — I739 Peripheral vascular disease, unspecified: Secondary | ICD-10-CM | POA: Diagnosis not present

## 2022-08-20 DIAGNOSIS — L851 Acquired keratosis [keratoderma] palmaris et plantaris: Secondary | ICD-10-CM | POA: Diagnosis not present

## 2022-08-20 DIAGNOSIS — B351 Tinea unguium: Secondary | ICD-10-CM | POA: Diagnosis not present

## 2022-08-20 DIAGNOSIS — M79675 Pain in left toe(s): Secondary | ICD-10-CM | POA: Diagnosis not present

## 2022-08-20 DIAGNOSIS — M79674 Pain in right toe(s): Secondary | ICD-10-CM | POA: Diagnosis not present

## 2022-08-21 DIAGNOSIS — G8929 Other chronic pain: Secondary | ICD-10-CM | POA: Diagnosis not present

## 2022-08-21 DIAGNOSIS — I4891 Unspecified atrial fibrillation: Secondary | ICD-10-CM | POA: Diagnosis not present

## 2022-08-21 DIAGNOSIS — I509 Heart failure, unspecified: Secondary | ICD-10-CM | POA: Diagnosis not present

## 2022-08-21 DIAGNOSIS — E782 Mixed hyperlipidemia: Secondary | ICD-10-CM | POA: Diagnosis not present

## 2022-08-21 DIAGNOSIS — I130 Hypertensive heart and chronic kidney disease with heart failure and stage 1 through stage 4 chronic kidney disease, or unspecified chronic kidney disease: Secondary | ICD-10-CM | POA: Diagnosis not present

## 2022-08-21 DIAGNOSIS — N189 Chronic kidney disease, unspecified: Secondary | ICD-10-CM | POA: Diagnosis not present

## 2022-08-21 DIAGNOSIS — R54 Age-related physical debility: Secondary | ICD-10-CM | POA: Diagnosis not present

## 2022-09-11 DIAGNOSIS — I509 Heart failure, unspecified: Secondary | ICD-10-CM | POA: Diagnosis not present

## 2022-09-11 DIAGNOSIS — G8929 Other chronic pain: Secondary | ICD-10-CM | POA: Diagnosis not present

## 2022-09-11 DIAGNOSIS — R54 Age-related physical debility: Secondary | ICD-10-CM | POA: Diagnosis not present

## 2022-09-11 DIAGNOSIS — N189 Chronic kidney disease, unspecified: Secondary | ICD-10-CM | POA: Diagnosis not present

## 2022-09-11 DIAGNOSIS — I4891 Unspecified atrial fibrillation: Secondary | ICD-10-CM | POA: Diagnosis not present

## 2022-09-11 DIAGNOSIS — E782 Mixed hyperlipidemia: Secondary | ICD-10-CM | POA: Diagnosis not present

## 2022-09-11 DIAGNOSIS — H409 Unspecified glaucoma: Secondary | ICD-10-CM | POA: Diagnosis not present

## 2022-09-11 DIAGNOSIS — I130 Hypertensive heart and chronic kidney disease with heart failure and stage 1 through stage 4 chronic kidney disease, or unspecified chronic kidney disease: Secondary | ICD-10-CM | POA: Diagnosis not present

## 2022-09-12 DIAGNOSIS — I4891 Unspecified atrial fibrillation: Secondary | ICD-10-CM | POA: Diagnosis not present

## 2022-09-12 DIAGNOSIS — N1832 Chronic kidney disease, stage 3b: Secondary | ICD-10-CM | POA: Diagnosis not present

## 2022-09-12 DIAGNOSIS — Z961 Presence of intraocular lens: Secondary | ICD-10-CM | POA: Diagnosis not present

## 2022-09-12 DIAGNOSIS — E782 Mixed hyperlipidemia: Secondary | ICD-10-CM | POA: Diagnosis not present

## 2022-09-12 DIAGNOSIS — E611 Iron deficiency: Secondary | ICD-10-CM | POA: Diagnosis not present

## 2022-09-12 DIAGNOSIS — I509 Heart failure, unspecified: Secondary | ICD-10-CM | POA: Diagnosis not present

## 2022-09-12 DIAGNOSIS — H35371 Puckering of macula, right eye: Secondary | ICD-10-CM | POA: Diagnosis not present

## 2022-09-12 DIAGNOSIS — Z87440 Personal history of urinary (tract) infections: Secondary | ICD-10-CM | POA: Diagnosis not present

## 2022-09-12 DIAGNOSIS — I130 Hypertensive heart and chronic kidney disease with heart failure and stage 1 through stage 4 chronic kidney disease, or unspecified chronic kidney disease: Secondary | ICD-10-CM | POA: Diagnosis not present

## 2022-09-12 DIAGNOSIS — K219 Gastro-esophageal reflux disease without esophagitis: Secondary | ICD-10-CM | POA: Diagnosis not present

## 2022-09-12 DIAGNOSIS — Z7901 Long term (current) use of anticoagulants: Secondary | ICD-10-CM | POA: Diagnosis not present

## 2022-09-12 DIAGNOSIS — H40013 Open angle with borderline findings, low risk, bilateral: Secondary | ICD-10-CM | POA: Diagnosis not present

## 2022-09-12 DIAGNOSIS — G8929 Other chronic pain: Secondary | ICD-10-CM | POA: Diagnosis not present

## 2022-09-12 DIAGNOSIS — I7122 Aneurysm of the aortic arch, without rupture: Secondary | ICD-10-CM | POA: Diagnosis not present

## 2022-10-03 DIAGNOSIS — I509 Heart failure, unspecified: Secondary | ICD-10-CM | POA: Diagnosis not present

## 2022-10-03 DIAGNOSIS — J310 Chronic rhinitis: Secondary | ICD-10-CM | POA: Diagnosis not present

## 2022-10-03 DIAGNOSIS — R296 Repeated falls: Secondary | ICD-10-CM | POA: Diagnosis not present

## 2022-10-30 DIAGNOSIS — R54 Age-related physical debility: Secondary | ICD-10-CM | POA: Diagnosis not present

## 2022-10-30 DIAGNOSIS — H409 Unspecified glaucoma: Secondary | ICD-10-CM | POA: Diagnosis not present

## 2022-10-30 DIAGNOSIS — G8929 Other chronic pain: Secondary | ICD-10-CM | POA: Diagnosis not present

## 2022-10-30 DIAGNOSIS — I130 Hypertensive heart and chronic kidney disease with heart failure and stage 1 through stage 4 chronic kidney disease, or unspecified chronic kidney disease: Secondary | ICD-10-CM | POA: Diagnosis not present

## 2022-10-30 DIAGNOSIS — I4891 Unspecified atrial fibrillation: Secondary | ICD-10-CM | POA: Diagnosis not present

## 2022-10-30 DIAGNOSIS — I509 Heart failure, unspecified: Secondary | ICD-10-CM | POA: Diagnosis not present

## 2022-10-30 DIAGNOSIS — E782 Mixed hyperlipidemia: Secondary | ICD-10-CM | POA: Diagnosis not present

## 2022-10-30 DIAGNOSIS — N189 Chronic kidney disease, unspecified: Secondary | ICD-10-CM | POA: Diagnosis not present

## 2022-10-31 DIAGNOSIS — N1832 Chronic kidney disease, stage 3b: Secondary | ICD-10-CM | POA: Diagnosis not present

## 2022-10-31 DIAGNOSIS — Z87440 Personal history of urinary (tract) infections: Secondary | ICD-10-CM | POA: Diagnosis not present

## 2022-10-31 DIAGNOSIS — Z7901 Long term (current) use of anticoagulants: Secondary | ICD-10-CM | POA: Diagnosis not present

## 2022-10-31 DIAGNOSIS — K219 Gastro-esophageal reflux disease without esophagitis: Secondary | ICD-10-CM | POA: Diagnosis not present

## 2022-10-31 DIAGNOSIS — E782 Mixed hyperlipidemia: Secondary | ICD-10-CM | POA: Diagnosis not present

## 2022-10-31 DIAGNOSIS — I4891 Unspecified atrial fibrillation: Secondary | ICD-10-CM | POA: Diagnosis not present

## 2022-10-31 DIAGNOSIS — E611 Iron deficiency: Secondary | ICD-10-CM | POA: Diagnosis not present

## 2022-10-31 DIAGNOSIS — I129 Hypertensive chronic kidney disease with stage 1 through stage 4 chronic kidney disease, or unspecified chronic kidney disease: Secondary | ICD-10-CM | POA: Diagnosis not present

## 2022-10-31 DIAGNOSIS — I7122 Aneurysm of the aortic arch, without rupture: Secondary | ICD-10-CM | POA: Diagnosis not present

## 2022-11-05 DIAGNOSIS — I739 Peripheral vascular disease, unspecified: Secondary | ICD-10-CM | POA: Diagnosis not present

## 2022-11-05 DIAGNOSIS — B351 Tinea unguium: Secondary | ICD-10-CM | POA: Diagnosis not present

## 2022-11-05 DIAGNOSIS — M79674 Pain in right toe(s): Secondary | ICD-10-CM | POA: Diagnosis not present

## 2022-11-05 DIAGNOSIS — L851 Acquired keratosis [keratoderma] palmaris et plantaris: Secondary | ICD-10-CM | POA: Diagnosis not present

## 2022-11-05 DIAGNOSIS — M79675 Pain in left toe(s): Secondary | ICD-10-CM | POA: Diagnosis not present

## 2022-11-28 DIAGNOSIS — I4891 Unspecified atrial fibrillation: Secondary | ICD-10-CM | POA: Diagnosis not present

## 2022-11-28 DIAGNOSIS — R54 Age-related physical debility: Secondary | ICD-10-CM | POA: Diagnosis not present

## 2022-11-28 DIAGNOSIS — E611 Iron deficiency: Secondary | ICD-10-CM | POA: Diagnosis not present

## 2022-11-28 DIAGNOSIS — I509 Heart failure, unspecified: Secondary | ICD-10-CM | POA: Diagnosis not present

## 2022-11-28 DIAGNOSIS — K219 Gastro-esophageal reflux disease without esophagitis: Secondary | ICD-10-CM | POA: Diagnosis not present

## 2022-11-28 DIAGNOSIS — N1832 Chronic kidney disease, stage 3b: Secondary | ICD-10-CM | POA: Diagnosis not present

## 2022-11-28 DIAGNOSIS — R2689 Other abnormalities of gait and mobility: Secondary | ICD-10-CM | POA: Diagnosis not present

## 2022-11-28 DIAGNOSIS — E782 Mixed hyperlipidemia: Secondary | ICD-10-CM | POA: Diagnosis not present

## 2022-11-28 DIAGNOSIS — I130 Hypertensive heart and chronic kidney disease with heart failure and stage 1 through stage 4 chronic kidney disease, or unspecified chronic kidney disease: Secondary | ICD-10-CM | POA: Diagnosis not present

## 2022-11-28 DIAGNOSIS — I7122 Aneurysm of the aortic arch, without rupture: Secondary | ICD-10-CM | POA: Diagnosis not present

## 2022-11-28 DIAGNOSIS — Z9181 History of falling: Secondary | ICD-10-CM | POA: Diagnosis not present

## 2022-12-10 DIAGNOSIS — G8929 Other chronic pain: Secondary | ICD-10-CM | POA: Diagnosis not present

## 2022-12-10 DIAGNOSIS — Z7901 Long term (current) use of anticoagulants: Secondary | ICD-10-CM | POA: Diagnosis not present

## 2022-12-10 DIAGNOSIS — E782 Mixed hyperlipidemia: Secondary | ICD-10-CM | POA: Diagnosis not present

## 2022-12-10 DIAGNOSIS — K219 Gastro-esophageal reflux disease without esophagitis: Secondary | ICD-10-CM | POA: Diagnosis not present

## 2022-12-10 DIAGNOSIS — Z87440 Personal history of urinary (tract) infections: Secondary | ICD-10-CM | POA: Diagnosis not present

## 2022-12-10 DIAGNOSIS — I130 Hypertensive heart and chronic kidney disease with heart failure and stage 1 through stage 4 chronic kidney disease, or unspecified chronic kidney disease: Secondary | ICD-10-CM | POA: Diagnosis not present

## 2022-12-10 DIAGNOSIS — D696 Thrombocytopenia, unspecified: Secondary | ICD-10-CM | POA: Diagnosis not present

## 2022-12-10 DIAGNOSIS — R7303 Prediabetes: Secondary | ICD-10-CM | POA: Diagnosis not present

## 2022-12-10 DIAGNOSIS — K222 Esophageal obstruction: Secondary | ICD-10-CM | POA: Diagnosis not present

## 2022-12-10 DIAGNOSIS — I509 Heart failure, unspecified: Secondary | ICD-10-CM | POA: Diagnosis not present

## 2022-12-10 DIAGNOSIS — N1832 Chronic kidney disease, stage 3b: Secondary | ICD-10-CM | POA: Diagnosis not present

## 2022-12-10 DIAGNOSIS — I4891 Unspecified atrial fibrillation: Secondary | ICD-10-CM | POA: Diagnosis not present

## 2022-12-10 DIAGNOSIS — Z9181 History of falling: Secondary | ICD-10-CM | POA: Diagnosis not present

## 2022-12-10 DIAGNOSIS — D6859 Other primary thrombophilia: Secondary | ICD-10-CM | POA: Diagnosis not present

## 2022-12-10 DIAGNOSIS — Z87442 Personal history of urinary calculi: Secondary | ICD-10-CM | POA: Diagnosis not present

## 2022-12-10 DIAGNOSIS — E611 Iron deficiency: Secondary | ICD-10-CM | POA: Diagnosis not present

## 2022-12-10 DIAGNOSIS — H409 Unspecified glaucoma: Secondary | ICD-10-CM | POA: Diagnosis not present

## 2022-12-22 DIAGNOSIS — R54 Age-related physical debility: Secondary | ICD-10-CM | POA: Diagnosis not present

## 2022-12-22 DIAGNOSIS — E782 Mixed hyperlipidemia: Secondary | ICD-10-CM | POA: Diagnosis not present

## 2022-12-22 DIAGNOSIS — I509 Heart failure, unspecified: Secondary | ICD-10-CM | POA: Diagnosis not present

## 2022-12-22 DIAGNOSIS — N189 Chronic kidney disease, unspecified: Secondary | ICD-10-CM | POA: Diagnosis not present

## 2022-12-22 DIAGNOSIS — G8929 Other chronic pain: Secondary | ICD-10-CM | POA: Diagnosis not present

## 2022-12-22 DIAGNOSIS — I4891 Unspecified atrial fibrillation: Secondary | ICD-10-CM | POA: Diagnosis not present

## 2022-12-22 DIAGNOSIS — H409 Unspecified glaucoma: Secondary | ICD-10-CM | POA: Diagnosis not present

## 2022-12-22 DIAGNOSIS — I130 Hypertensive heart and chronic kidney disease with heart failure and stage 1 through stage 4 chronic kidney disease, or unspecified chronic kidney disease: Secondary | ICD-10-CM | POA: Diagnosis not present

## 2022-12-26 ENCOUNTER — Other Ambulatory Visit (INDEPENDENT_AMBULATORY_CARE_PROVIDER_SITE_OTHER): Payer: Self-pay | Admitting: Gastroenterology

## 2023-01-02 DIAGNOSIS — Z9181 History of falling: Secondary | ICD-10-CM | POA: Diagnosis not present

## 2023-01-02 DIAGNOSIS — R54 Age-related physical debility: Secondary | ICD-10-CM | POA: Diagnosis not present

## 2023-01-02 DIAGNOSIS — I130 Hypertensive heart and chronic kidney disease with heart failure and stage 1 through stage 4 chronic kidney disease, or unspecified chronic kidney disease: Secondary | ICD-10-CM | POA: Diagnosis not present

## 2023-01-02 DIAGNOSIS — I7122 Aneurysm of the aortic arch, without rupture: Secondary | ICD-10-CM | POA: Diagnosis not present

## 2023-01-02 DIAGNOSIS — N1832 Chronic kidney disease, stage 3b: Secondary | ICD-10-CM | POA: Diagnosis not present

## 2023-01-02 DIAGNOSIS — I509 Heart failure, unspecified: Secondary | ICD-10-CM | POA: Diagnosis not present

## 2023-01-02 DIAGNOSIS — E782 Mixed hyperlipidemia: Secondary | ICD-10-CM | POA: Diagnosis not present

## 2023-01-02 DIAGNOSIS — E611 Iron deficiency: Secondary | ICD-10-CM | POA: Diagnosis not present

## 2023-01-02 DIAGNOSIS — K219 Gastro-esophageal reflux disease without esophagitis: Secondary | ICD-10-CM | POA: Diagnosis not present

## 2023-01-02 DIAGNOSIS — I4891 Unspecified atrial fibrillation: Secondary | ICD-10-CM | POA: Diagnosis not present

## 2023-01-02 DIAGNOSIS — R2689 Other abnormalities of gait and mobility: Secondary | ICD-10-CM | POA: Diagnosis not present

## 2023-01-21 DIAGNOSIS — I509 Heart failure, unspecified: Secondary | ICD-10-CM | POA: Diagnosis not present

## 2023-01-21 DIAGNOSIS — E782 Mixed hyperlipidemia: Secondary | ICD-10-CM | POA: Diagnosis not present

## 2023-01-21 DIAGNOSIS — I4891 Unspecified atrial fibrillation: Secondary | ICD-10-CM | POA: Diagnosis not present

## 2023-01-21 DIAGNOSIS — N189 Chronic kidney disease, unspecified: Secondary | ICD-10-CM | POA: Diagnosis not present

## 2023-01-21 DIAGNOSIS — I130 Hypertensive heart and chronic kidney disease with heart failure and stage 1 through stage 4 chronic kidney disease, or unspecified chronic kidney disease: Secondary | ICD-10-CM | POA: Diagnosis not present

## 2023-01-21 DIAGNOSIS — G8929 Other chronic pain: Secondary | ICD-10-CM | POA: Diagnosis not present

## 2023-01-21 DIAGNOSIS — R54 Age-related physical debility: Secondary | ICD-10-CM | POA: Diagnosis not present

## 2023-01-28 DIAGNOSIS — L851 Acquired keratosis [keratoderma] palmaris et plantaris: Secondary | ICD-10-CM | POA: Diagnosis not present

## 2023-01-28 DIAGNOSIS — M79674 Pain in right toe(s): Secondary | ICD-10-CM | POA: Diagnosis not present

## 2023-01-28 DIAGNOSIS — M79675 Pain in left toe(s): Secondary | ICD-10-CM | POA: Diagnosis not present

## 2023-01-28 DIAGNOSIS — B351 Tinea unguium: Secondary | ICD-10-CM | POA: Diagnosis not present

## 2023-01-28 DIAGNOSIS — I739 Peripheral vascular disease, unspecified: Secondary | ICD-10-CM | POA: Diagnosis not present

## 2023-01-30 DIAGNOSIS — K219 Gastro-esophageal reflux disease without esophagitis: Secondary | ICD-10-CM | POA: Diagnosis not present

## 2023-01-30 DIAGNOSIS — I7122 Aneurysm of the aortic arch, without rupture: Secondary | ICD-10-CM | POA: Diagnosis not present

## 2023-01-30 DIAGNOSIS — R2689 Other abnormalities of gait and mobility: Secondary | ICD-10-CM | POA: Diagnosis not present

## 2023-01-30 DIAGNOSIS — E782 Mixed hyperlipidemia: Secondary | ICD-10-CM | POA: Diagnosis not present

## 2023-01-30 DIAGNOSIS — Z9181 History of falling: Secondary | ICD-10-CM | POA: Diagnosis not present

## 2023-01-30 DIAGNOSIS — I4891 Unspecified atrial fibrillation: Secondary | ICD-10-CM | POA: Diagnosis not present

## 2023-01-30 DIAGNOSIS — E611 Iron deficiency: Secondary | ICD-10-CM | POA: Diagnosis not present

## 2023-01-30 DIAGNOSIS — R0982 Postnasal drip: Secondary | ICD-10-CM | POA: Diagnosis not present

## 2023-01-30 DIAGNOSIS — I509 Heart failure, unspecified: Secondary | ICD-10-CM | POA: Diagnosis not present

## 2023-01-30 DIAGNOSIS — R54 Age-related physical debility: Secondary | ICD-10-CM | POA: Diagnosis not present

## 2023-01-30 DIAGNOSIS — I130 Hypertensive heart and chronic kidney disease with heart failure and stage 1 through stage 4 chronic kidney disease, or unspecified chronic kidney disease: Secondary | ICD-10-CM | POA: Diagnosis not present

## 2023-01-30 DIAGNOSIS — N1832 Chronic kidney disease, stage 3b: Secondary | ICD-10-CM | POA: Diagnosis not present

## 2023-01-31 ENCOUNTER — Other Ambulatory Visit: Payer: Self-pay | Admitting: Thoracic Surgery (Cardiothoracic Vascular Surgery)

## 2023-01-31 DIAGNOSIS — I7121 Aneurysm of the ascending aorta, without rupture: Secondary | ICD-10-CM

## 2023-02-13 ENCOUNTER — Other Ambulatory Visit (INDEPENDENT_AMBULATORY_CARE_PROVIDER_SITE_OTHER): Payer: Self-pay | Admitting: Gastroenterology

## 2023-02-14 ENCOUNTER — Ambulatory Visit (INDEPENDENT_AMBULATORY_CARE_PROVIDER_SITE_OTHER): Payer: PPO | Admitting: Gastroenterology

## 2023-02-21 DIAGNOSIS — I110 Hypertensive heart disease with heart failure: Secondary | ICD-10-CM | POA: Diagnosis not present

## 2023-02-21 DIAGNOSIS — R54 Age-related physical debility: Secondary | ICD-10-CM | POA: Diagnosis not present

## 2023-02-21 DIAGNOSIS — E782 Mixed hyperlipidemia: Secondary | ICD-10-CM | POA: Diagnosis not present

## 2023-02-21 DIAGNOSIS — G8929 Other chronic pain: Secondary | ICD-10-CM | POA: Diagnosis not present

## 2023-02-21 DIAGNOSIS — I4891 Unspecified atrial fibrillation: Secondary | ICD-10-CM | POA: Diagnosis not present

## 2023-03-12 DIAGNOSIS — E782 Mixed hyperlipidemia: Secondary | ICD-10-CM | POA: Diagnosis not present

## 2023-03-12 DIAGNOSIS — G8929 Other chronic pain: Secondary | ICD-10-CM | POA: Diagnosis not present

## 2023-03-12 DIAGNOSIS — N189 Chronic kidney disease, unspecified: Secondary | ICD-10-CM | POA: Diagnosis not present

## 2023-03-12 DIAGNOSIS — I130 Hypertensive heart and chronic kidney disease with heart failure and stage 1 through stage 4 chronic kidney disease, or unspecified chronic kidney disease: Secondary | ICD-10-CM | POA: Diagnosis not present

## 2023-03-12 DIAGNOSIS — I4891 Unspecified atrial fibrillation: Secondary | ICD-10-CM | POA: Diagnosis not present

## 2023-03-12 DIAGNOSIS — R54 Age-related physical debility: Secondary | ICD-10-CM | POA: Diagnosis not present

## 2023-03-12 DIAGNOSIS — I509 Heart failure, unspecified: Secondary | ICD-10-CM | POA: Diagnosis not present

## 2023-03-13 DIAGNOSIS — Z23 Encounter for immunization: Secondary | ICD-10-CM | POA: Diagnosis not present

## 2023-03-13 DIAGNOSIS — R2689 Other abnormalities of gait and mobility: Secondary | ICD-10-CM | POA: Diagnosis not present

## 2023-03-13 DIAGNOSIS — N1832 Chronic kidney disease, stage 3b: Secondary | ICD-10-CM | POA: Diagnosis not present

## 2023-03-13 DIAGNOSIS — I509 Heart failure, unspecified: Secondary | ICD-10-CM | POA: Diagnosis not present

## 2023-03-13 DIAGNOSIS — L309 Dermatitis, unspecified: Secondary | ICD-10-CM | POA: Diagnosis not present

## 2023-03-13 DIAGNOSIS — K219 Gastro-esophageal reflux disease without esophagitis: Secondary | ICD-10-CM | POA: Diagnosis not present

## 2023-03-13 DIAGNOSIS — R54 Age-related physical debility: Secondary | ICD-10-CM | POA: Diagnosis not present

## 2023-03-13 DIAGNOSIS — K5909 Other constipation: Secondary | ICD-10-CM | POA: Diagnosis not present

## 2023-03-13 DIAGNOSIS — I7122 Aneurysm of the aortic arch, without rupture: Secondary | ICD-10-CM | POA: Diagnosis not present

## 2023-03-13 DIAGNOSIS — E782 Mixed hyperlipidemia: Secondary | ICD-10-CM | POA: Diagnosis not present

## 2023-03-13 DIAGNOSIS — I130 Hypertensive heart and chronic kidney disease with heart failure and stage 1 through stage 4 chronic kidney disease, or unspecified chronic kidney disease: Secondary | ICD-10-CM | POA: Diagnosis not present

## 2023-03-13 DIAGNOSIS — E611 Iron deficiency: Secondary | ICD-10-CM | POA: Diagnosis not present

## 2023-03-13 DIAGNOSIS — I4891 Unspecified atrial fibrillation: Secondary | ICD-10-CM | POA: Diagnosis not present

## 2023-03-20 ENCOUNTER — Inpatient Hospital Stay: Admission: RE | Admit: 2023-03-20 | Payer: PPO | Source: Ambulatory Visit

## 2023-03-26 ENCOUNTER — Ambulatory Visit: Payer: PPO | Admitting: Thoracic Surgery (Cardiothoracic Vascular Surgery)

## 2023-03-26 ENCOUNTER — Other Ambulatory Visit (INDEPENDENT_AMBULATORY_CARE_PROVIDER_SITE_OTHER): Payer: Self-pay | Admitting: Gastroenterology

## 2023-03-26 NOTE — Telephone Encounter (Signed)
Patient will need an office visit for further refills. Missed 01/2023 appointment for 6 month follow up.

## 2023-04-08 DIAGNOSIS — M79675 Pain in left toe(s): Secondary | ICD-10-CM | POA: Diagnosis not present

## 2023-04-08 DIAGNOSIS — L851 Acquired keratosis [keratoderma] palmaris et plantaris: Secondary | ICD-10-CM | POA: Diagnosis not present

## 2023-04-08 DIAGNOSIS — I739 Peripheral vascular disease, unspecified: Secondary | ICD-10-CM | POA: Diagnosis not present

## 2023-04-08 DIAGNOSIS — B351 Tinea unguium: Secondary | ICD-10-CM | POA: Diagnosis not present

## 2023-05-01 DIAGNOSIS — K219 Gastro-esophageal reflux disease without esophagitis: Secondary | ICD-10-CM | POA: Diagnosis not present

## 2023-05-01 DIAGNOSIS — I10 Essential (primary) hypertension: Secondary | ICD-10-CM | POA: Diagnosis not present

## 2023-05-01 DIAGNOSIS — R001 Bradycardia, unspecified: Secondary | ICD-10-CM | POA: Diagnosis not present

## 2023-05-01 DIAGNOSIS — K222 Esophageal obstruction: Secondary | ICD-10-CM | POA: Diagnosis not present

## 2023-05-07 IMAGING — DX DG ABDOMEN 1V
2 series · 2 of 2 positions shown · non-contrast
Comparison: 11/17/2019

CLINICAL DATA: History of kidney stones

EXAM:
ABDOMEN - 1 VIEW

[abdomen kub (1 of 2)]
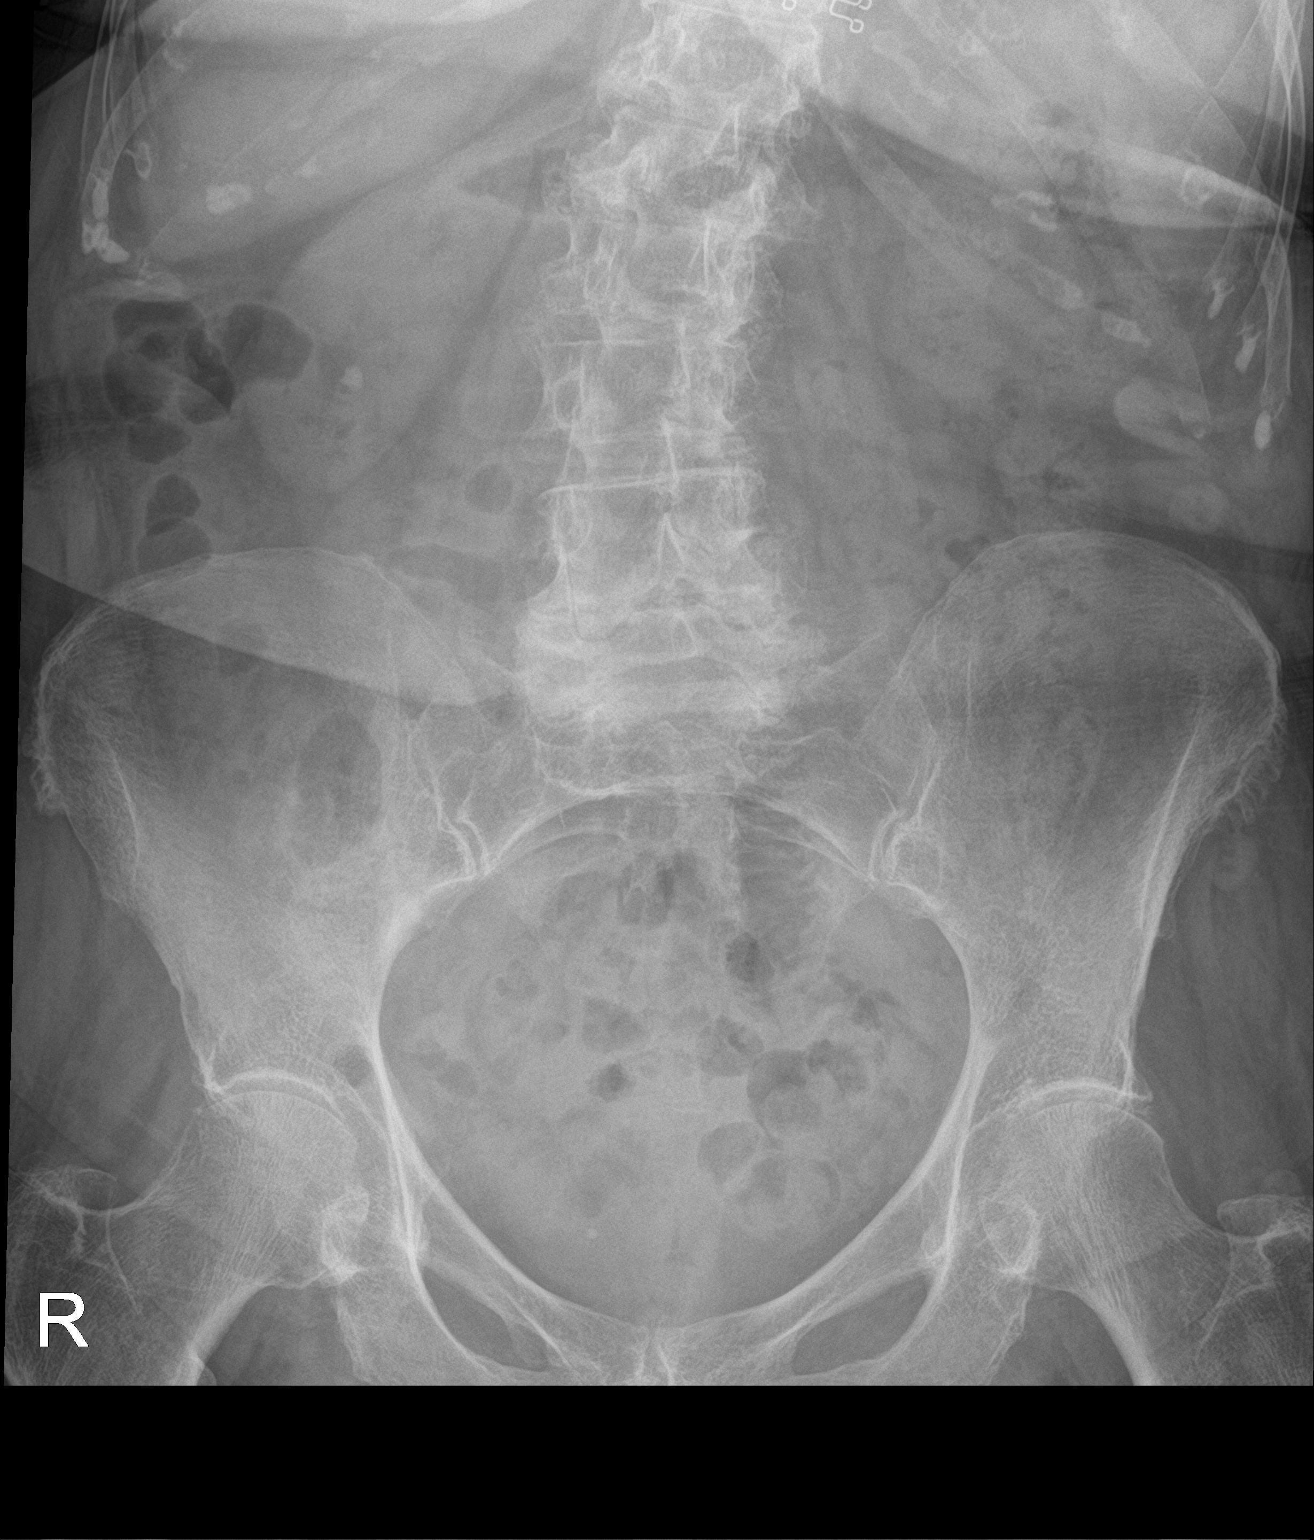

[abdomen kub (2 of 2)]
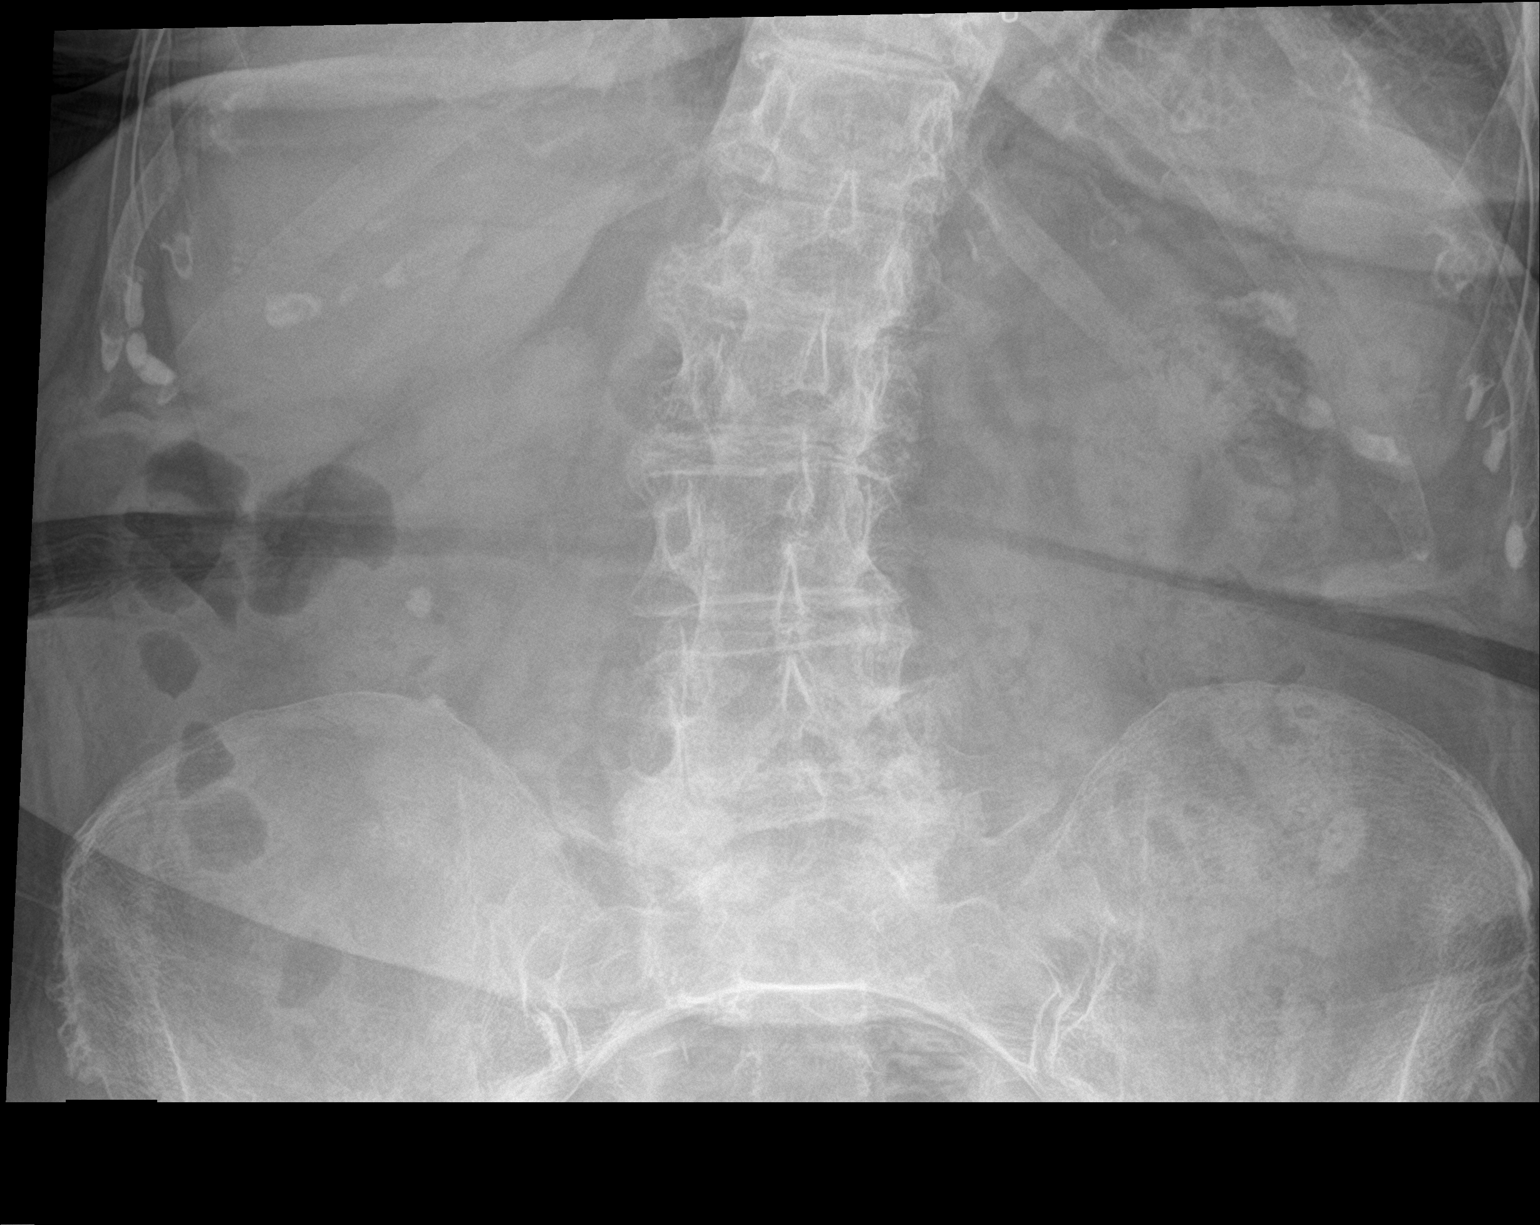

[2 of 2 positions shown; findings below may reference images not displayed]

FINDINGS: Small calculus projects over the inferior pole of the right kidney.
No other radiopaque calculi identified. Probable small phlebolith in
the low pelvis, unchanged. Disc degenerative disease of the thoracic
spine. Nonobstructive pattern of bowel gas.
IMPRESSION: Small calculus projects over the inferior pole of the right kidney.
No other radiopaque calculi identified. Probable small phlebolith in
the low pelvis, unchanged.

## 2023-05-16 DIAGNOSIS — H409 Unspecified glaucoma: Secondary | ICD-10-CM | POA: Diagnosis not present

## 2023-05-16 DIAGNOSIS — I130 Hypertensive heart and chronic kidney disease with heart failure and stage 1 through stage 4 chronic kidney disease, or unspecified chronic kidney disease: Secondary | ICD-10-CM | POA: Diagnosis not present

## 2023-05-16 DIAGNOSIS — E782 Mixed hyperlipidemia: Secondary | ICD-10-CM | POA: Diagnosis not present

## 2023-05-16 DIAGNOSIS — I4891 Unspecified atrial fibrillation: Secondary | ICD-10-CM | POA: Diagnosis not present

## 2023-05-16 DIAGNOSIS — R54 Age-related physical debility: Secondary | ICD-10-CM | POA: Diagnosis not present

## 2023-05-16 DIAGNOSIS — G8929 Other chronic pain: Secondary | ICD-10-CM | POA: Diagnosis not present

## 2023-05-16 DIAGNOSIS — N189 Chronic kidney disease, unspecified: Secondary | ICD-10-CM | POA: Diagnosis not present

## 2023-05-16 DIAGNOSIS — I509 Heart failure, unspecified: Secondary | ICD-10-CM | POA: Diagnosis not present

## 2023-06-17 DIAGNOSIS — B351 Tinea unguium: Secondary | ICD-10-CM | POA: Diagnosis not present

## 2023-06-17 DIAGNOSIS — M79674 Pain in right toe(s): Secondary | ICD-10-CM | POA: Diagnosis not present

## 2023-06-17 DIAGNOSIS — L851 Acquired keratosis [keratoderma] palmaris et plantaris: Secondary | ICD-10-CM | POA: Diagnosis not present

## 2023-06-17 DIAGNOSIS — M79675 Pain in left toe(s): Secondary | ICD-10-CM | POA: Diagnosis not present

## 2023-06-17 DIAGNOSIS — I739 Peripheral vascular disease, unspecified: Secondary | ICD-10-CM | POA: Diagnosis not present

## 2023-06-19 DIAGNOSIS — H409 Unspecified glaucoma: Secondary | ICD-10-CM | POA: Diagnosis not present

## 2023-06-19 DIAGNOSIS — Z0001 Encounter for general adult medical examination with abnormal findings: Secondary | ICD-10-CM | POA: Diagnosis not present

## 2023-06-19 DIAGNOSIS — I110 Hypertensive heart disease with heart failure: Secondary | ICD-10-CM | POA: Diagnosis not present

## 2023-07-02 ENCOUNTER — Other Ambulatory Visit: Payer: Self-pay

## 2023-07-02 ENCOUNTER — Emergency Department (HOSPITAL_COMMUNITY)
Admission: EM | Admit: 2023-07-02 | Discharge: 2023-07-02 | Disposition: A | Discharge status: Home / Self Care | Attending: Emergency Medicine | Admitting: Emergency Medicine

## 2023-07-02 ENCOUNTER — Emergency Department (HOSPITAL_COMMUNITY)

## 2023-07-02 ENCOUNTER — Encounter (HOSPITAL_COMMUNITY): Payer: Self-pay | Admitting: Emergency Medicine

## 2023-07-02 DIAGNOSIS — K449 Diaphragmatic hernia without obstruction or gangrene: Secondary | ICD-10-CM | POA: Diagnosis not present

## 2023-07-02 DIAGNOSIS — Y92009 Unspecified place in unspecified non-institutional (private) residence as the place of occurrence of the external cause: Secondary | ICD-10-CM | POA: Insufficient documentation

## 2023-07-02 DIAGNOSIS — S0990XA Unspecified injury of head, initial encounter: Secondary | ICD-10-CM

## 2023-07-02 DIAGNOSIS — W010XXA Fall on same level from slipping, tripping and stumbling without subsequent striking against object, initial encounter: Secondary | ICD-10-CM | POA: Insufficient documentation

## 2023-07-02 DIAGNOSIS — M79603 Pain in arm, unspecified: Secondary | ICD-10-CM | POA: Diagnosis not present

## 2023-07-02 DIAGNOSIS — Z79899 Other long term (current) drug therapy: Secondary | ICD-10-CM | POA: Insufficient documentation

## 2023-07-02 DIAGNOSIS — I719 Aortic aneurysm of unspecified site, without rupture: Secondary | ICD-10-CM | POA: Diagnosis not present

## 2023-07-02 DIAGNOSIS — W19XXXA Unspecified fall, initial encounter: Secondary | ICD-10-CM | POA: Diagnosis not present

## 2023-07-02 DIAGNOSIS — S40011A Contusion of right shoulder, initial encounter: Secondary | ICD-10-CM | POA: Insufficient documentation

## 2023-07-02 DIAGNOSIS — I10 Essential (primary) hypertension: Secondary | ICD-10-CM | POA: Insufficient documentation

## 2023-07-02 DIAGNOSIS — S0011XA Contusion of right eyelid and periocular area, initial encounter: Secondary | ICD-10-CM | POA: Insufficient documentation

## 2023-07-02 DIAGNOSIS — S161XXA Strain of muscle, fascia and tendon at neck level, initial encounter: Secondary | ICD-10-CM | POA: Diagnosis not present

## 2023-07-02 DIAGNOSIS — I517 Cardiomegaly: Secondary | ICD-10-CM | POA: Diagnosis not present

## 2023-07-02 DIAGNOSIS — S022XXA Fracture of nasal bones, initial encounter for closed fracture: Secondary | ICD-10-CM | POA: Insufficient documentation

## 2023-07-02 DIAGNOSIS — S0083XA Contusion of other part of head, initial encounter: Secondary | ICD-10-CM | POA: Diagnosis not present

## 2023-07-02 DIAGNOSIS — S0003XA Contusion of scalp, initial encounter: Secondary | ICD-10-CM | POA: Diagnosis not present

## 2023-07-02 DIAGNOSIS — R58 Hemorrhage, not elsewhere classified: Secondary | ICD-10-CM | POA: Diagnosis not present

## 2023-07-02 DIAGNOSIS — Z7901 Long term (current) use of anticoagulants: Secondary | ICD-10-CM | POA: Insufficient documentation

## 2023-07-02 DIAGNOSIS — S0012XA Contusion of left eyelid and periocular area, initial encounter: Secondary | ICD-10-CM | POA: Insufficient documentation

## 2023-07-02 DIAGNOSIS — T07XXXA Unspecified multiple injuries, initial encounter: Secondary | ICD-10-CM | POA: Diagnosis not present

## 2023-07-02 DIAGNOSIS — R609 Edema, unspecified: Secondary | ICD-10-CM | POA: Diagnosis not present

## 2023-07-02 MED ORDER — BACITRACIN ZINC 500 UNIT/GM EX OINT
TOPICAL_OINTMENT | CUTANEOUS | Status: AC
  Administered 2023-07-02: 1 via TOPICAL
  Filled 2023-07-02: qty 0.9

## 2023-07-02 MED ORDER — BACITRACIN ZINC 500 UNIT/GM EX OINT: TOPICAL_OINTMENT | Freq: Once | CUTANEOUS | Status: AC

## 2023-07-02 NOTE — ED Triage Notes (Signed)
 Pt tripped at home and fell face first. Pt c/o right arm pain and nasal pain.

## 2023-07-02 NOTE — Discharge Instructions (Signed)
 Apply bacitracin and Band-Aid twice daily to the wound on your nose.  Ice for 20 minutes every 2 hours while awake for the next 2 days.  Follow-up with ENT.  The contact information for [facility] has been provided in this discharge summary for you to call and make these arrangements.  Return to the emergency department if you experience any new and/or concerning issues.

## 2023-07-02 NOTE — ED Provider Notes (Signed)
 [name]   CSN: [id number] Arrival date & time: 07/02/23  0037     History  Chief Complaint  Patient presents with   Marletta Lor    [name] is a 89 y.o. female.  Patient is a 89 year old female with history of hypertension, hyperlipidemia, polycythemia, ascending aortic aneurysm.  Patient presenting today for evaluation of injuries sustained in a fall.  She reports tripping over a rug, falling forward, and striking her face on the ground.  She denies any loss of consciousness, but does complain of pain to her right shoulder, the bridge of her nose, and face.  She has some headache, but denies any neck pain.  She had to summon EMS with her life alert.  The history is provided by the patient.       Home Medications Prior to Admission medications   Medication Sig Start Date End Date Taking? Authorizing Provider  amiodarone (PACERONE) 200 MG tablet TAKE (1) TABLET BY MOUTH DAILY. 10/31/21   Strader, Lennart Pall, PA-C  apixaban (ELIQUIS) 5 MG TABS tablet TAKE (1) TABLET BY MOUTH TWICE DAILY. 03/10/21   Malissa Hippo, MD  Calcium Carbonate-Vitamin D 600-400 MG-UNIT tablet Take 1 tablet by mouth daily.    [provider]  Cinnamon 500 MG capsule Take 1,000 mg by mouth every other day.    [provider]  Coenzyme Q10 (COQ10) 100 MG CAPS Take 100 mg by mouth daily.    [provider]  furosemide (LASIX) 20 MG tablet Take 1 tablet (20 mg total) by mouth daily as needed. Patient taking differently: Take 40 mg by mouth daily. 10/20/19   Strader, Lennart Pall, PA-C  gabapentin (NEURONTIN) 300 MG capsule Take 300 mg by mouth. One every night    [provider]  metoprolol succinate (TOPROL-XL) 50 MG 24 hr tablet Take 1 tablet (50 mg total) by mouth in the morning and at bedtime. 03/20/22   Loreli Slot, MD  Multiple Vitamin (MULTIVITAMIN WITH MINERALS) TABS tablet Take 1 tablet by mouth  daily.    [provider]  pantoprazole (PROTONIX) 40 MG tablet TAKE ONE TABLET BY MOUTH ONCE DAILY BEFORE BREAKFAST. 03/26/23   Carlan, Chelsea L, NP  potassium chloride SA (KLOR-CON) 20 MEQ tablet Take 1 tablet (20 mEq total) by mouth daily. 01/08/21   Vassie Loll, MD  pravastatin (PRAVACHOL) 40 MG tablet Take 40 mg by mouth at bedtime.     [provider]      Allergies    Sulfa antibiotics    Review of Systems   Review of Systems  All other systems reviewed and are negative.   Physical Exam Updated Vital Signs BP 138/75   Pulse 80   Temp 97.8 F (36.6 C) (Oral)   Resp 16   Ht 5\' 2"  (1.575 m)   Wt 64 kg   SpO2 91%   BMI 25.81 kg/m  Physical Exam Vitals and nursing [name] reviewed.  Constitutional:      General: She is not in acute distress.    Appearance: She is well-developed. She is not diaphoretic.  HENT:     Head: Normocephalic.     Comments: There is periorbital ecchymosis bilaterally, but no proptosis.    Nose:     Comments: There is a small, less than 1 cm laceration to the bridge of the nose.  Bleeding is controlled.  Septum is midline. Eyes:     Extraocular Movements: Extraocular  movements intact.     Pupils: Pupils are equal, round, and reactive to light.  Cardiovascular:     Rate and Rhythm: Normal rate and regular rhythm.     Heart sounds: No murmur heard.    No friction rub. No gallop.  Pulmonary:     Effort: Pulmonary effort is normal. No respiratory distress.     Breath sounds: Normal breath sounds. No wheezing.  Abdominal:     General: Bowel sounds are normal. There is no distension.     Palpations: Abdomen is soft.     Tenderness: There is no abdominal tenderness.  Musculoskeletal:        General: Normal range of motion.     Cervical back: Normal range of motion and neck supple.  Skin:    General: Skin is warm and dry.  Neurological:     General: No focal deficit present.     Mental Status: She is alert and oriented to  person, place, and time. Mental status is at baseline.     Cranial Nerves: No cranial nerve deficit.     Gait: Gait normal.     ED Results / Procedures / Treatments   Labs (all labs ordered are listed, but only abnormal results are displayed) Labs Reviewed - No data to display  EKG None  Radiology No results found.  Procedures Procedures    Medications Ordered in ED Medications - No data to display  ED Course/ Medical Decision Making/ A&P  Patient is a 89 year old female brought by EMS for evaluation of a fall.  She had some sort of syncopal episode at home and fell forward landing on her face.  There was no loss of consciousness and patient arrives here neurologically intact.  She does have some bleeding from the bridge of her nose, and bruising to the right shoulder, but physical examination otherwise unremarkable.  Imaging studies obtained including CT scans of the head, neck, and maxillofacial bones.  Head and neck are unremarkable, but she does have a comminuted fracture of the nasal bones.  X-rays also taken of the chest and right shoulder showing no acute traumatic abnormality.  Bleeding controlled with direct pressure.  She has a small laceration that I do not feel is in need of repair and can be left alone.  Patient to be discharged to home with as needed return.  She does have considerable ecchymosis of the periorbital tissues bilaterally and I suspect that this will swell further.  Patient advised to apply ice and return as needed for any problems.  Final Clinical Impression(s) / ED Diagnoses Final diagnoses:  None    Rx / DC Orders ED Discharge Orders     None         Geoffery Lyons, MD 07/02/23 [phone]

## 2023-07-02 NOTE — ED Notes (Signed)
 Cleaned wound over nasal bridge and upper lip, applied bacitracin, covered with gauze and secured with cloth tape. Wound care education provided to patient and son with understanding verbalized by both.

## 2023-07-03 DIAGNOSIS — W1839XD Other fall on same level, subsequent encounter: Secondary | ICD-10-CM | POA: Diagnosis not present

## 2023-07-03 DIAGNOSIS — S022XXD Fracture of nasal bones, subsequent encounter for fracture with routine healing: Secondary | ICD-10-CM | POA: Diagnosis not present

## 2023-07-03 DIAGNOSIS — M25511 Pain in right shoulder: Secondary | ICD-10-CM | POA: Diagnosis not present

## 2023-07-04 DIAGNOSIS — N189 Chronic kidney disease, unspecified: Secondary | ICD-10-CM | POA: Diagnosis not present

## 2023-07-04 DIAGNOSIS — G8929 Other chronic pain: Secondary | ICD-10-CM | POA: Diagnosis not present

## 2023-07-04 DIAGNOSIS — R54 Age-related physical debility: Secondary | ICD-10-CM | POA: Diagnosis not present

## 2023-07-04 DIAGNOSIS — I509 Heart failure, unspecified: Secondary | ICD-10-CM | POA: Diagnosis not present

## 2023-07-04 DIAGNOSIS — I4891 Unspecified atrial fibrillation: Secondary | ICD-10-CM | POA: Diagnosis not present

## 2023-07-04 DIAGNOSIS — H409 Unspecified glaucoma: Secondary | ICD-10-CM | POA: Diagnosis not present

## 2023-07-04 DIAGNOSIS — E782 Mixed hyperlipidemia: Secondary | ICD-10-CM | POA: Diagnosis not present

## 2023-07-04 DIAGNOSIS — I130 Hypertensive heart and chronic kidney disease with heart failure and stage 1 through stage 4 chronic kidney disease, or unspecified chronic kidney disease: Secondary | ICD-10-CM | POA: Diagnosis not present

## 2023-07-06 ENCOUNTER — Other Ambulatory Visit: Payer: Self-pay

## 2023-07-06 ENCOUNTER — Inpatient Hospital Stay (HOSPITAL_COMMUNITY)
Admission: EM | Admit: 2023-07-06 | Discharge: 2023-07-09 | DRG: 151 | Disposition: A | Discharge status: Home Health | Attending: Internal Medicine | Admitting: Internal Medicine

## 2023-07-06 ENCOUNTER — Encounter (HOSPITAL_COMMUNITY): Payer: Self-pay

## 2023-07-06 ENCOUNTER — Emergency Department (HOSPITAL_COMMUNITY)

## 2023-07-06 DIAGNOSIS — Z9889 Other specified postprocedural states: Secondary | ICD-10-CM

## 2023-07-06 DIAGNOSIS — J9811 Atelectasis: Secondary | ICD-10-CM | POA: Diagnosis present

## 2023-07-06 DIAGNOSIS — W1809XA Striking against other object with subsequent fall, initial encounter: Secondary | ICD-10-CM | POA: Diagnosis present

## 2023-07-06 DIAGNOSIS — K449 Diaphragmatic hernia without obstruction or gangrene: Secondary | ICD-10-CM | POA: Diagnosis not present

## 2023-07-06 DIAGNOSIS — I7121 Aneurysm of the ascending aorta, without rupture: Secondary | ICD-10-CM | POA: Diagnosis not present

## 2023-07-06 DIAGNOSIS — I10 Essential (primary) hypertension: Secondary | ICD-10-CM | POA: Diagnosis not present

## 2023-07-06 DIAGNOSIS — R040 Epistaxis: Secondary | ICD-10-CM | POA: Diagnosis not present

## 2023-07-06 DIAGNOSIS — Z882 Allergy status to sulfonamides status: Secondary | ICD-10-CM | POA: Diagnosis not present

## 2023-07-06 DIAGNOSIS — I5032 Chronic diastolic (congestive) heart failure: Secondary | ICD-10-CM | POA: Diagnosis not present

## 2023-07-06 DIAGNOSIS — K2100 Gastro-esophageal reflux disease with esophagitis, without bleeding: Secondary | ICD-10-CM | POA: Diagnosis present

## 2023-07-06 DIAGNOSIS — R0902 Hypoxemia: Secondary | ICD-10-CM | POA: Diagnosis not present

## 2023-07-06 DIAGNOSIS — I110 Hypertensive heart disease with heart failure: Secondary | ICD-10-CM | POA: Diagnosis not present

## 2023-07-06 DIAGNOSIS — Z7901 Long term (current) use of anticoagulants: Secondary | ICD-10-CM

## 2023-07-06 DIAGNOSIS — Z9181 History of falling: Secondary | ICD-10-CM | POA: Diagnosis not present

## 2023-07-06 DIAGNOSIS — Z9841 Cataract extraction status, right eye: Secondary | ICD-10-CM | POA: Diagnosis not present

## 2023-07-06 DIAGNOSIS — E782 Mixed hyperlipidemia: Secondary | ICD-10-CM

## 2023-07-06 DIAGNOSIS — Z9842 Cataract extraction status, left eye: Secondary | ICD-10-CM | POA: Diagnosis not present

## 2023-07-06 DIAGNOSIS — Z8719 Personal history of other diseases of the digestive system: Secondary | ICD-10-CM | POA: Diagnosis not present

## 2023-07-06 DIAGNOSIS — I2510 Atherosclerotic heart disease of native coronary artery without angina pectoris: Secondary | ICD-10-CM | POA: Diagnosis not present

## 2023-07-06 DIAGNOSIS — I480 Paroxysmal atrial fibrillation: Secondary | ICD-10-CM | POA: Diagnosis present

## 2023-07-06 DIAGNOSIS — R0602 Shortness of breath: Secondary | ICD-10-CM | POA: Diagnosis not present

## 2023-07-06 DIAGNOSIS — Z79899 Other long term (current) drug therapy: Secondary | ICD-10-CM | POA: Diagnosis not present

## 2023-07-06 DIAGNOSIS — Z8619 Personal history of other infectious and parasitic diseases: Secondary | ICD-10-CM

## 2023-07-06 DIAGNOSIS — S0083XA Contusion of other part of head, initial encounter: Secondary | ICD-10-CM | POA: Diagnosis not present

## 2023-07-06 DIAGNOSIS — S022XXA Fracture of nasal bones, initial encounter for closed fracture: Secondary | ICD-10-CM | POA: Diagnosis present

## 2023-07-06 DIAGNOSIS — Z87442 Personal history of urinary calculi: Secondary | ICD-10-CM

## 2023-07-06 DIAGNOSIS — E785 Hyperlipidemia, unspecified: Secondary | ICD-10-CM | POA: Diagnosis not present

## 2023-07-06 DIAGNOSIS — Z66 Do not resuscitate: Secondary | ICD-10-CM | POA: Diagnosis not present

## 2023-07-06 DIAGNOSIS — D6832 Hemorrhagic disorder due to extrinsic circulating anticoagulants: Secondary | ICD-10-CM | POA: Diagnosis not present

## 2023-07-06 DIAGNOSIS — W19XXXA Unspecified fall, initial encounter: Secondary | ICD-10-CM | POA: Diagnosis not present

## 2023-07-06 DIAGNOSIS — D751 Secondary polycythemia: Secondary | ICD-10-CM | POA: Diagnosis not present

## 2023-07-06 DIAGNOSIS — Z8249 Family history of ischemic heart disease and other diseases of the circulatory system: Secondary | ICD-10-CM

## 2023-07-06 DIAGNOSIS — T45515A Adverse effect of anticoagulants, initial encounter: Secondary | ICD-10-CM | POA: Diagnosis present

## 2023-07-06 DIAGNOSIS — R58 Hemorrhage, not elsewhere classified: Secondary | ICD-10-CM | POA: Diagnosis not present

## 2023-07-06 LAB — CBC WITH DIFFERENTIAL/PLATELET
Abs Immature Granulocytes: 0.02 10*3/uL (ref 0.00–0.07)
Basophils Absolute: 0 10*3/uL (ref 0.0–0.1)
Basophils Relative: 1 %
Eosinophils Absolute: 0.1 10*3/uL (ref 0.0–0.5)
Eosinophils Relative: 1 %
HCT: 39.7 % (ref 36.0–46.0)
Hemoglobin: 12.4 g/dL (ref 12.0–15.0)
Immature Granulocytes: 0 %
Lymphocytes Relative: 15 %
Lymphs Abs: 1 10*3/uL (ref 0.7–4.0)
MCH: 30.8 pg (ref 26.0–34.0)
MCHC: 31.2 g/dL (ref 30.0–36.0)
MCV: 98.8 fL (ref 80.0–100.0)
Monocytes Absolute: 0.7 10*3/uL (ref 0.1–1.0)
Monocytes Relative: 10 %
Neutro Abs: 5 10*3/uL (ref 1.7–7.7)
Neutrophils Relative %: 73 %
Platelets: 184 10*3/uL (ref 150–400)
RBC: 4.02 MIL/uL (ref 3.87–5.11)
RDW: 14.1 % (ref 11.5–15.5)
WBC: 6.8 10*3/uL (ref 4.0–10.5)
nRBC: 0 % (ref 0.0–0.2)

## 2023-07-06 LAB — COMPREHENSIVE METABOLIC PANEL
ALT: 17 U/L (ref 0–44)
AST: 21 U/L (ref 15–41)
Albumin: 3.9 g/dL (ref 3.5–5.0)
Alkaline Phosphatase: 48 U/L (ref 38–126)
Anion gap: 9 (ref 5–15)
BUN: 19 mg/dL (ref 8–23)
CO2: 27 mmol/L (ref 22–32)
Calcium: 9 mg/dL (ref 8.9–10.3)
Chloride: 100 mmol/L (ref 98–111)
Creatinine, Ser: 1.03 mg/dL — ABNORMAL HIGH (ref 0.44–1.00)
GFR, Estimated: 51 mL/min — ABNORMAL LOW (ref >60)
Glucose, Bld: 177 mg/dL — ABNORMAL HIGH (ref 70–99)
Potassium: 4.7 mmol/L (ref 3.5–5.1)
Sodium: 136 mmol/L (ref 135–145)
Total Bilirubin: 0.6 mg/dL (ref 0.0–1.2)
Total Protein: 7 g/dL (ref 6.5–8.1)

## 2023-07-06 LAB — TYPE AND SCREEN
ABO/RH(D): A NEG
Antibody Screen: NEGATIVE

## 2023-07-06 LAB — PROTIME-INR
INR: 1.3 — ABNORMAL HIGH (ref 0.8–1.2)
Prothrombin Time: 16.4 s — ABNORMAL HIGH (ref 11.4–15.2)

## 2023-07-06 NOTE — Assessment & Plan Note (Addendum)
 Patient with no signs of acute exacerbation, plan to continue rhythm control for atrial fibrillation with amiodarone.  Continue metoprolol succinate and furosemide.

## 2023-07-06 NOTE — Assessment & Plan Note (Signed)
 Patient with severe epistaxis, due to recent trauma and concomitant use of apixaban.   A packing has been placed on her left nostril with improvement in bleeding but not completely subsided.  Her 02 saturation is 86% on room air, consistent with acute hypoxemic respiratory failure.   Plan to continue supportive medical therapy. Hold on apixaban.  Follow up H&H.  Follow up with ENT. Patient is being transferred to [facility], she will be seen in the ED by Dr Jearld Fenton before transported to the medical ward in case she needs more urgent intervention.  Supplemental 02 per facial mask to keep 02 saturation 92%/

## 2023-07-06 NOTE — Assessment & Plan Note (Signed)
 Cell count currently stable.  Her platelets are not low.

## 2023-07-06 NOTE — ED Triage Notes (Signed)
 Pt had a fall Monday, seen and evaluated, diagnosed with nasal fracture. Pt takes eliquis. States bleeding has been under control until tonight at 1800 where her nose started bleeding slowly, progressing until she could not control it with pressure. EMS admin nasal spray and pressure, unable to control. Miller at bedside on arrival.

## 2023-07-06 NOTE — Assessment & Plan Note (Addendum)
 Continue amiodarone for rate and rhythm control and metoprolol for rate control Currently she is sinus rhythm.  Hold anticoagulation in the setting of active bleeding.

## 2023-07-06 NOTE — H&P (Signed)
 History and Physical    Patient: [name]:[id number] DOB: 1929-01-01 DOA: 07/06/2023 DOS: the patient was seen and examined on 07/06/2023 PCP: [facility]  Patient coming from: Home  Chief Complaint:  Chief Complaint  Patient presents with   Epistaxis   HPI: [name] is a 89 y.o. female with medical history significant of ascending aortic aneurysm, hypertension, paroxysmal atrial fibrillation, polycythemia and thrombocytopenia who presented with severe epistaxis.   03/11 Patient sustained a mechanical fall after tripping over a rug. She had facial trauma that resulted in a comminuted fracture of the nasal bones. Patient was discharged home, with instructions to return if needed.  She had a open wound at the bridge of the nose that had intermittent bleeding, some what difficult to control with local pressure. At home patient continue taking apixaban, last dose was this morning.  Today during the afternoon to evening hours she developed acute and severe bleeding from her left nostril. Noted bright red blood, profuse in intensity, refractive to application of local, pressure. It was not associated with nose pain or dyspnea.  Because of the severity and intensity of the bleeding her son called EMS. The bleeding was not controlled at home with nasal spry and pressure, then she was transported to the ED.    Review of Systems: As mentioned in the history of present illness. All other systems reviewed and are negative. Past Medical History:  Diagnosis Date   Anemia    Ascending aortic aneurysm (HCC)    a. 4.6cm by CT 07/2016.   Chronic diastolic CHF (congestive heart failure) (HCC)    Coronary artery calcification seen on CT scan 07/2016   History of blood transfusion    History of kidney stones    Hypertension    Hypomagnesemia    PAF (paroxysmal atrial fibrillation) (HCC)    a. dx during adm 08/2016.   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   Negative Jak2,  BCR/ABL, normal epo level on 02/01/2014   Sepsis (HCC) 08/2016   Thrombocytopenia (HCC)    Trigeminal neuralgia    Past Surgical History:  Procedure Laterality Date   BREAST CYST EXCISION  60 yrs ago   COLONOSCOPY WITH ESOPHAGOGASTRODUODENOSCOPY (EGD) N/A 03/10/2013   Rehman: normal except few diverticula in simoid   CYSTOSCOPY W/ URETERAL STENT PLACEMENT Right 01/02/2021   Procedure: CYSTOSCOPY WITH RETROGRADE PYELOGRAM/URETERAL STENT PLACEMENT;  Surgeon: Malen Gauze, MD;  Location: [facility];  Service: Urology;  Laterality: Right;   CYSTOSCOPY WITH RETROGRADE PYELOGRAM, URETEROSCOPY AND STENT PLACEMENT Right 01/19/2021   Procedure: CYSTOSCOPY WITH RETROGRADE PYELOGRAM, URETEROSCOPY AND STENT EXCHANGE;  Surgeon: Malen Gauze, MD;  Location: [facility];  Service: Urology;  Laterality: Right;   ESOPHAGEAL DILATION N/A 03/08/2021   Procedure: ESOPHAGEAL DILATION;  Surgeon: Malissa Hippo, MD;  Location: [facility];  Service: Endoscopy;  Laterality: N/A;   ESOPHAGOGASTRODUODENOSCOPY  2014   Erosive reflux esophagitis with stricture at GE junction which was dilated with a balloon dilator to 18 mm. Moderate size sliding hiatal hernia. small ulcer at gastric body along with antral erosions   ESOPHAGOGASTRODUODENOSCOPY (EGD) WITH PROPOFOL N/A 03/08/2021   Procedure: ESOPHAGOGASTRODUODENOSCOPY (EGD) WITH PROPOFOL;  Surgeon: Malissa Hippo, MD;  Location: [facility];  Service: Endoscopy;  Laterality: N/A;  9:20   EXTRACORPOREAL SHOCK WAVE LITHOTRIPSY Right 09/20/2016   Procedure: RIGHT EXTRACORPOREAL SHOCK WAVE LITHOTRIPSY (ESWL);  Surgeon: Alfredo Martinez, MD;  Location: [facility];  Service: Urology;  Laterality: Right;   EXTRACORPOREAL  SHOCK WAVE LITHOTRIPSY Right 02/16/2019   Procedure: EXTRACORPOREAL SHOCK WAVE LITHOTRIPSY (ESWL);  Surgeon: Marcine Matar, MD;  Location: [facility];  Service: Urology;  Laterality: Right;   EXTRACORPOREAL SHOCK WAVE LITHOTRIPSY Right 04/27/2019    Procedure: EXTRACORPOREAL SHOCK WAVE LITHOTRIPSY (ESWL);  Surgeon: Marcine Matar, MD;  Location: [facility];  Service: Urology;  Laterality: Right;   EYE SURGERY     cataract surgery bilat    Gamma Knife     Trigeminal    HOLMIUM LASER APPLICATION Right 01/19/2021   Procedure: HOLMIUM LASER APPLICATION;  Surgeon: Malen Gauze, MD;  Location: [facility];  Service: Urology;  Laterality: Right;   SLT LASER APPLICATION Left 09/27/2014   Procedure: SLT LASER APPLICATION;  Surgeon: Susa Simmonds, MD;  Location: [facility];  Service: Ophthalmology;  Laterality: Left;   Social History:  reports that she has never smoked. She has never been exposed to tobacco smoke. She has never used smokeless tobacco. She reports that she does not drink alcohol and does not use drugs.  Allergies  Allergen Reactions   Sulfa Antibiotics Other (See Comments)    Pt states that this med makes her feel crazy.      Family History  Problem Relation Age of Onset   Congestive Heart Failure Mother    Heart attack Father    CAD Neg Hx     Prior to Admission medications   Medication Sig Start Date End Date Taking? Authorizing Provider  amiodarone (PACERONE) 200 MG tablet TAKE (1) TABLET BY MOUTH DAILY. 10/31/21   Strader, Lennart Pall, PA-C  apixaban (ELIQUIS) 5 MG TABS tablet TAKE (1) TABLET BY MOUTH TWICE DAILY. 03/10/21   Malissa Hippo, MD  Calcium Carbonate-Vitamin D 600-400 MG-UNIT tablet Take 1 tablet by mouth daily.    [provider]  Cinnamon 500 MG capsule Take 1,000 mg by mouth every other day.    [provider]  Coenzyme Q10 (COQ10) 100 MG CAPS Take 100 mg by mouth daily.    [provider]  furosemide (LASIX) 20 MG tablet Take 1 tablet (20 mg total) by mouth daily as needed. Patient taking differently: Take 40 mg by mouth daily. 10/20/19   Strader, Lennart Pall, PA-C  gabapentin (NEURONTIN) 300 MG capsule Take 300 mg by mouth. One every night    [provider]   metoprolol succinate (TOPROL-XL) 50 MG 24 hr tablet Take 1 tablet (50 mg total) by mouth in the morning and at bedtime. 03/20/22   Loreli Slot, MD  Multiple Vitamin (MULTIVITAMIN WITH MINERALS) TABS tablet Take 1 tablet by mouth daily.    [provider]  pantoprazole (PROTONIX) 40 MG tablet TAKE ONE TABLET BY MOUTH ONCE DAILY BEFORE BREAKFAST. 03/26/23   Carlan, Chelsea L, NP  potassium chloride SA (KLOR-CON) 20 MEQ tablet Take 1 tablet (20 mEq total) by mouth daily. 01/08/21   Vassie Loll, MD  pravastatin (PRAVACHOL) 40 MG tablet Take 40 mg by mouth at bedtime.     [provider]    Physical Exam: Vitals:   07/06/23 2121 07/06/23 2123  BP:  (!) 162/75  Pulse:  75  Resp:  20  SpO2:  (!) 86%  Weight: 64 kg   Height: 5\' 2"  (1.575 m)    Neurology awake and alert. Deconditioned and ill looking appearing ENT with nasal deformity, she has a nasal packing in place on the left nostril, and small amounts of bright red blood is coming through her nose.  She  has significant ecchymosis in her face.  Cardiovascular with S1 and S2 present, regular with no gallops, rubs or murmurs Abdomen with no distention  No lower extremity edema.  Data Reviewed:   Na 136, K 4,7 Cl 100, bicarbonate 27, glucose 177, bun 19 cr 1,0 AST 21, ALT 17  Wbc 6.8 hgb 12.4 plt 184  INR 1,3    Chest radiograph with right rotation, hypoinflation, with bilateral basal atelectasis, no infiltrates or effusions.   EKG 72 bom, normal axis, normal intervals. Qtc 435, sinus rhythm with no significant ST segment or T wave changes.   Assessment and Plan: * Epistaxis Patient with severe epistaxis, due to recent trauma and concomitant use of apixaban.   A packing has been placed on her left nostril with improvement in bleeding but not completely subsided.  Her 02 saturation is 86% on room air, consistent with acute hypoxemic respiratory failure.   Plan to continue supportive medical  therapy. Hold on apixaban.  Follow up H&H.  Follow up with ENT. Patient is being transferred to [facility], she will be seen in the ED by Dr Jearld Fenton before transported to the medical ward in case she needs more urgent intervention.  Supplemental 02 per facial mask to keep 02 saturation 92%/   PAF (paroxysmal atrial fibrillation) (HCC) Continue amiodarone for rate and rhythm control and metoprolol for rate control Currently she is sinus rhythm.  Hold anticoagulation in the setting of active bleeding.   Chronic diastolic CHF (congestive heart failure) (HCC) Patient with no signs of acute exacerbation, plan to continue rhythm control for atrial fibrillation with amiodarone.  Continue metoprolol succinate and furosemide.   Essential hypertension, benign Continue blood pressure monitoring, patient on metoprolol succinate.   Hyperlipidemia Continue with pravastatin.   Polycythemia, secondary Cell count currently stable.  Her platelets are not low.      Advance Care Planning:   Code Status: Prior   Consults: ENT Dr Jearld Fenton will see patient in ED [facility].   Family Communication: I spoke with patient's son at the bedside, we talked in detail about patient's condition, plan of care and prognosis and all questions were addressed.    Severity of Illness: The appropriate patient status for this patient is INPATIENT. Inpatient status is judged to be reasonable and necessary in order to provide the required intensity of service to ensure the patient's safety. The patient's presenting symptoms, physical exam findings, and initial radiographic and laboratory data in the context of their chronic comorbidities is felt to place them at high risk for further clinical deterioration. Furthermore, it is not anticipated that the patient will be medically stable for discharge from the hospital within 2 midnights of admission.   * I certify that at the point of admission it is my clinical judgment that the patient  will require inpatient hospital care spanning beyond 2 midnights from the point of admission due to high intensity of service, high risk for further deterioration and high frequency of surveillance required.*  Author: Coralie Keens, MD 07/06/2023 10:53 PM  For on call review [email or web address].

## 2023-07-06 NOTE — Assessment & Plan Note (Signed)
 Continue blood pressure monitoring, patient on metoprolol succinate.

## 2023-07-06 NOTE — Assessment & Plan Note (Signed)
 Continue with pravastatin

## 2023-07-06 NOTE — ED Provider Notes (Signed)
 [place] EMERGENCY DEPARTMENT AT [facility] Provider Note   CSN: [id number] Arrival date & time: 07/06/23  2113     History  Chief Complaint  Patient presents with   Epistaxis    [name] is a 89 y.o. female.   Epistaxis  This patient is a 89 year old female, she has a history of hypertensive heart disease, chronic kidney disease, heart failure, atrial fibrillation and is on Eliquis.  She had a fall approximately 4 days ago and struck her face causing a comminuted nasal bone fracture, imaging was performed, the patient had no bloody nose at that time.  Over the last 12 hours the patient has had some small amount of bleeding from her nose but it became quite heavy this evening going both out the front and out the back of her nose.  Paramedics transported the patient to the hospital with fairly heavy bleeding.    Home Medications Prior to Admission medications   Medication Sig Start Date End Date Taking? Authorizing Provider  amiodarone (PACERONE) 200 MG tablet TAKE (1) TABLET BY MOUTH DAILY. 10/31/21   Strader, Lennart Pall, PA-C  apixaban (ELIQUIS) 5 MG TABS tablet TAKE (1) TABLET BY MOUTH TWICE DAILY. 03/10/21   Malissa Hippo, MD  Calcium Carbonate-Vitamin D 600-400 MG-UNIT tablet Take 1 tablet by mouth daily.    [provider]  Cinnamon 500 MG capsule Take 1,000 mg by mouth every other day.    [provider]  Coenzyme Q10 (COQ10) 100 MG CAPS Take 100 mg by mouth daily.    [provider]  furosemide (LASIX) 20 MG tablet Take 1 tablet (20 mg total) by mouth daily as needed. Patient taking differently: Take 40 mg by mouth daily. 10/20/19   Strader, Lennart Pall, PA-C  gabapentin (NEURONTIN) 300 MG capsule Take 300 mg by mouth. One every night    [provider]  metoprolol succinate (TOPROL-XL) 50 MG 24 hr tablet Take 1 tablet (50 mg total) by mouth in the morning and at bedtime. 03/20/22   Loreli Slot, MD  Multiple  Vitamin (MULTIVITAMIN WITH MINERALS) TABS tablet Take 1 tablet by mouth daily.    [provider]  pantoprazole (PROTONIX) 40 MG tablet TAKE ONE TABLET BY MOUTH ONCE DAILY BEFORE BREAKFAST. 03/26/23   Carlan, Chelsea L, NP  potassium chloride SA (KLOR-CON) 20 MEQ tablet Take 1 tablet (20 mEq total) by mouth daily. 01/08/21   Vassie Loll, MD  pravastatin (PRAVACHOL) 40 MG tablet Take 40 mg by mouth at bedtime.     [provider]      Allergies    Sulfa antibiotics    Review of Systems   Review of Systems  HENT:  Positive for nosebleeds.   All other systems reviewed and are negative.   Physical Exam Updated Vital Signs BP (!) 162/75   Pulse 75   Resp 20   Ht 1.575 m (5\' 2" )   Wt 64 kg   SpO2 (!) 86%   BMI 25.79 kg/m  Physical Exam Vitals and nursing note reviewed.  Constitutional:      General: She is in acute distress.     Appearance: She is well-developed.  HENT:     Head: Normocephalic and atraumatic.     Nose:     Comments: The face is covered in ecchymosis bilaterally around the eyes to the nose and the mouth.  It is primarily focused around the nose and there is mild tenderness over the nasal  bone.  Bilateral nostrils are examined the right side is normal, the left side is briskly bleeding bright red blood.  It is not visualized where it is coming from that it is also going down the throat.    Mouth/Throat:     Pharynx: No oropharyngeal exudate.  Eyes:     General: No scleral icterus.       Right eye: No discharge.        Left eye: No discharge.     Conjunctiva/sclera: Conjunctivae normal.     Pupils: Pupils are equal, round, and reactive to light.  Neck:     Thyroid: No thyromegaly.     Vascular: No JVD.  Cardiovascular:     Rate and Rhythm: Normal rate and regular rhythm.     Heart sounds: Normal heart sounds. No murmur heard.    No friction rub. No gallop.  Pulmonary:     Effort: Pulmonary effort is normal. No respiratory distress.      Breath sounds: Normal breath sounds. No wheezing or rales.  Abdominal:     General: Bowel sounds are normal. There is no distension.     Palpations: Abdomen is soft. There is no mass.     Tenderness: There is no abdominal tenderness.  Musculoskeletal:        General: No tenderness. Normal range of motion.     Cervical back: Normal range of motion and neck supple.     Right lower leg: No edema.     Left lower leg: No edema.  Lymphadenopathy:     Cervical: No cervical adenopathy.  Skin:    General: Skin is warm and dry.     Findings: No erythema or rash.  Neurological:     General: No focal deficit present.     Mental Status: She is alert.     Coordination: Coordination normal.  Psychiatric:        Behavior: Behavior normal.     ED Results / Procedures / Treatments   Labs (all labs ordered are listed, but only abnormal results are displayed) Labs Reviewed  PROTIME-INR - Abnormal; Notable for the following components:      Result Value   Prothrombin Time 16.4 (*)    INR 1.3 (*)    All other components within normal limits  COMPREHENSIVE METABOLIC PANEL - Abnormal; Notable for the following components:   Glucose, Bld 177 (*)    Creatinine, Ser 1.03 (*)    GFR, Estimated 51 (*)    All other components within normal limits  CBC WITH DIFFERENTIAL/PLATELET  TYPE AND SCREEN    EKG EKG Interpretation Date/Time:  Saturday July 06 2023 21:30:54 EDT Ventricular Rate:  72 PR Interval:  158 QRS Duration:  110 QT Interval:  397 QTC Calculation: 435 R Axis:   104  Text Interpretation: Sinus rhythm Prominent P waves, nondiagnostic Right axis deviation Nonspecific ST abnormality Baseline wander in lead(s) V2 Confirmed by Eber Hong ([id number]) on 07/06/2023 10:33:59 PM  Radiology No results found.  Procedures Epistaxis Management  Date/Time: 07/06/2023 9:47 PM  Performed by: Eber Hong, MD Authorized by: Eber Hong, MD   Consent:    Consent obtained:  Verbal    Consent given by:  Patient   Risks, benefits, and alternatives were discussed: yes     Risks discussed:  Bleeding, infection, nasal injury and pain   Alternatives discussed:  Delayed treatment Universal protocol:    Procedure explained and questions answered to patient or proxy's satisfaction: yes  Immediately prior to procedure, a time out was called: yes     Patient identity confirmed:  Verbally with patient Anesthesia:    Anesthesia method:  None Procedure details:    Treatment site:  L posterior   Treatment method:  Nasal balloon   Treatment complexity:  Extensive   Treatment episode: initial   Post-procedure details:    Assessment:  Bleeding decreased   Procedure completion:  Tolerated well, no immediate complications Comments:       .Critical Care  Performed by: Eber Hong, MD Authorized by: Eber Hong, MD   Critical care provider statement:    Critical care time (minutes):  45   Critical care time was exclusive of:  Separately billable procedures and treating other patients   Critical care was necessary to treat or prevent imminent or life-threatening deterioration of the following conditions: Severe epistaxis, hypoxia.   Critical care was time spent personally by me on the following activities:  Development of treatment plan with patient or surrogate, discussions with consultants, evaluation of patient's response to treatment, examination of patient, obtaining history from patient or surrogate, review of old charts, re-evaluation of patient's condition, pulse oximetry, ordering and review of radiographic studies, ordering and review of laboratory studies and ordering and performing treatments and interventions   I assumed direction of critical care for this patient from another provider in my specialty: no     Care discussed with: admitting provider       Medications Ordered in ED Medications - No data to display  ED Course/ Medical Decision Making/  A&P Clinical Course as of 07/06/23 2243  Sat Jul 06, 2023  2135 I discussed the case with Dr. Jearld Fenton, will wait to see if packing helps, will likely need transfer for cauterization given that she has hypoxic.  If she does not stop bleeding may need to change pack or try different approach [BM]    Clinical Course User Index [BM] Eber Hong, MD                                 Medical Decision Making Amount and/or Complexity of Data Reviewed Labs: ordered.  Risk Decision regarding hospitalization.   This patient is ill-appearing, she is hemorrhaging from her nose, nasal packing was placed immediately upon arrival and she is now bleeding from her mouth.  Cardiac monitor, EKG, labs  Labs: No anemia, no leukocytosis, metabolic panel reassuring  Meds:  Afrin given on arrival,   Interventions:  Packed nose - O2 for hypoxia 84% without O2 - can only use R nare.  Suction - for the blood In the mouth - brisk but decreased  Bleeding gradually slowed to a point where she was no longer spitting up blood.  The packing did not require to be removed however the patient continued to be hypoxic to 84 to 85% and required a modified nonrebreather to help with oxygenation  I discussed the case with Dr. Ella Jubilee who will admit the patient and coordinate transfer.  She will go to the ED first so that ENT can see the patient and stabilize as needed for bleeding.        Final Clinical Impression(s) / ED Diagnoses Final diagnoses:  Left-sided epistaxis  hypoxia    Eber Hong, MD 07/06/23 2243

## 2023-07-07 DIAGNOSIS — I5032 Chronic diastolic (congestive) heart failure: Secondary | ICD-10-CM | POA: Diagnosis not present

## 2023-07-07 DIAGNOSIS — R040 Epistaxis: Secondary | ICD-10-CM | POA: Diagnosis not present

## 2023-07-07 LAB — CBC
HCT: 33.8 % — ABNORMAL LOW (ref 36.0–46.0)
Hemoglobin: 10.9 g/dL — ABNORMAL LOW (ref 12.0–15.0)
MCH: 31.1 pg (ref 26.0–34.0)
MCHC: 32.2 g/dL (ref 30.0–36.0)
MCV: 96.6 fL (ref 80.0–100.0)
Platelets: 158 10*3/uL (ref 150–400)
RBC: 3.5 MIL/uL — ABNORMAL LOW (ref 3.87–5.11)
RDW: 14.1 % (ref 11.5–15.5)
WBC: 6.7 10*3/uL (ref 4.0–10.5)
nRBC: 0 % (ref 0.0–0.2)

## 2023-07-07 LAB — BASIC METABOLIC PANEL
Anion gap: 10 (ref 5–15)
BUN: 20 mg/dL (ref 8–23)
CO2: 25 mmol/L (ref 22–32)
Calcium: 8.7 mg/dL — ABNORMAL LOW (ref 8.9–10.3)
Chloride: 103 mmol/L (ref 98–111)
Creatinine, Ser: 1.06 mg/dL — ABNORMAL HIGH (ref 0.44–1.00)
GFR, Estimated: 49 mL/min — ABNORMAL LOW (ref >60)
Glucose, Bld: 137 mg/dL — ABNORMAL HIGH (ref 70–99)
Potassium: 4.5 mmol/L (ref 3.5–5.1)
Sodium: 138 mmol/L (ref 135–145)

## 2023-07-07 LAB — TYPE AND SCREEN
ABO/RH(D): A NEG
Antibody Screen: NEGATIVE

## 2023-07-07 MED ORDER — AMIODARONE HCL 200 MG PO TABS
200.0000 mg | ORAL_TABLET | Freq: Every day | ORAL | Status: DC
  Administered 2023-07-07 – 2023-07-09 (×3): 200 mg via ORAL
  Filled 2023-07-07 (×3): qty 1

## 2023-07-07 MED ORDER — ONDANSETRON HCL 4 MG/2ML IJ SOLN: 4.0000 mg | Freq: Four times a day (QID) | INTRAMUSCULAR | Status: DC | PRN

## 2023-07-07 MED ORDER — ADULT MULTIVITAMIN W/MINERALS CH
1.0000 | ORAL_TABLET | Freq: Every day | ORAL | Status: DC
  Administered 2023-07-07 – 2023-07-09 (×3): 1 via ORAL
  Filled 2023-07-07 (×3): qty 1

## 2023-07-07 MED ORDER — ONDANSETRON HCL 4 MG PO TABS: 4.0000 mg | ORAL_TABLET | Freq: Four times a day (QID) | ORAL | Status: DC | PRN

## 2023-07-07 MED ORDER — ACETAMINOPHEN 650 MG RE SUPP: 650.0000 mg | Freq: Four times a day (QID) | RECTAL | Status: DC | PRN

## 2023-07-07 MED ORDER — FUROSEMIDE 40 MG PO TABS
40.0000 mg | ORAL_TABLET | Freq: Every day | ORAL | Status: DC
  Administered 2023-07-07 – 2023-07-09 (×3): 40 mg via ORAL
  Filled 2023-07-07 (×3): qty 1

## 2023-07-07 MED ORDER — TRAMADOL HCL 50 MG PO TABS
25.0000 mg | ORAL_TABLET | Freq: Once | ORAL | Status: AC | PRN
  Administered 2023-07-07: 25 mg via ORAL
  Filled 2023-07-07: qty 1

## 2023-07-07 MED ORDER — POTASSIUM CHLORIDE CRYS ER 20 MEQ PO TBCR
20.0000 meq | EXTENDED_RELEASE_TABLET | Freq: Every day | ORAL | Status: DC
  Administered 2023-07-07 – 2023-07-09 (×3): 20 meq via ORAL
  Filled 2023-07-07 (×3): qty 1

## 2023-07-07 MED ORDER — ACETAMINOPHEN 325 MG PO TABS: 650.0000 mg | ORAL_TABLET | Freq: Four times a day (QID) | ORAL | Status: DC | PRN

## 2023-07-07 MED ORDER — GABAPENTIN 300 MG PO CAPS
300.0000 mg | ORAL_CAPSULE | Freq: Every day | ORAL | Status: DC
  Administered 2023-07-07 – 2023-07-08 (×2): 300 mg via ORAL
  Filled 2023-07-07 (×2): qty 1

## 2023-07-07 MED ORDER — PRAVASTATIN SODIUM 40 MG PO TABS
20.0000 mg | ORAL_TABLET | Freq: Every day | ORAL | Status: DC
  Administered 2023-07-07 – 2023-07-08 (×2): 20 mg via ORAL
  Filled 2023-07-07 (×2): qty 1

## 2023-07-07 MED ORDER — OYSTER SHELL CALCIUM/D3 500-5 MG-MCG PO TABS
1.0000 | ORAL_TABLET | Freq: Every day | ORAL | Status: DC
  Administered 2023-07-07 – 2023-07-09 (×3): 1 via ORAL
  Filled 2023-07-07 (×3): qty 1

## 2023-07-07 MED ORDER — METOPROLOL SUCCINATE ER 25 MG PO TB24
25.0000 mg | ORAL_TABLET | Freq: Every day | ORAL | Status: DC
  Administered 2023-07-07 – 2023-07-09 (×3): 25 mg via ORAL
  Filled 2023-07-07 (×3): qty 1

## 2023-07-07 MED ORDER — PANTOPRAZOLE SODIUM 40 MG PO TBEC
40.0000 mg | DELAYED_RELEASE_TABLET | Freq: Every day | ORAL | Status: DC
  Administered 2023-07-07 – 2023-07-09 (×3): 40 mg via ORAL
  Filled 2023-07-07 (×3): qty 1

## 2023-07-07 NOTE — Plan of Care (Signed)

## 2023-07-07 NOTE — Progress Notes (Signed)
 [facility] PROGRESS NOTE    Progress Note  [name]  EAV:[id number] DOB: 01-09-1929 DOA: 07/06/2023 PCP: [facility]     Brief Narrative:   [name] is an 89 y.o. female past medical history significant for ascending aortic aneurysm, essential hypertension, paroxysmal atrial fibrillation on Eliquis, polycythemia and thrombocytopenia, presents with severe epistaxis.  Patient had a mechanical fall on 07/02/2023 had a facial comminuted fracture of the nasal bone, has an open wound at the bridge of the nose that had intermittent bleeding difficult to control with local pressure at home, her last dose of Eliquis was on the morning of admission.  Started having severe bleeding in the afternoon  Assessment/Plan:   Epistaxis in the setting of Eliquis use: In place and bleeding has subsided because her saturations were 86% on room air she was admitted for further evaluation and supportive care.  Eliquis was held. Patient transferred to [facility]. Awaiting ENT further recommendations. She is having some burgundy stools.  There is probably coming from her epistaxis.  Now up to bleeding have subsided we will continue to monitor her stools.  PAF (paroxysmal atrial fibrillation) (HCC) Continue amiodarone and metoprolol for rate control. In sinus rhythm hold Eliquis.  Chronic diastolic CHF (congestive heart failure) (HCC) Appears compensated continue amiodarone metoprolol and furosemide.  Essential hypertension, benign Blood pressure is well-controlled.  Hyperlipidemia Continue statins.  Polycythemia, secondary Mild drop in hemoglobin from 12.4-10.9.     DVT prophylaxis: scd Family Communication:son Status is: Inpatient Remains inpatient appropriate because: Epistaxis    Code Status:     Code Status Orders  (From admission, onward)           Start     Ordered   07/07/23 0532  Do not attempt resuscitation (DNR)- Limited -Do Not  Intubate (DNI)  (Code Status)  Continuous       Question Answer Comment  If pulseless and not breathing No CPR or chest compressions.   In Pre-Arrest Conditions (Patient Is Breathing and Has A Pulse) Do not intubate. Provide all appropriate non-invasive medical interventions. Avoid ICU transfer unless indicated or required.   Consent: Discussion documented in EHR or advanced directives reviewed      07/07/23 0532           Code Status History     Date Active Date Inactive Code Status Order ID Comments User Context   07/06/2023 2311 07/07/2023 0532 Full Code [id number]  Coralie Keens, MD ED   01/02/2021 0757 01/08/2021 0022 Full Code [id number]  Frankey Shown, DO ED   08/20/2016 1854 08/25/2016 2050 Full Code [id number]  Pearson Grippe, MD Inpatient         IV Access:   Peripheral IV   Procedures and diagnostic studies:   DG Chest Port 1 View Result Date: 07/06/2023 CLINICAL DATA:  Shortness of breath, hypoxia EXAM: PORTABLE CHEST 1 VIEW COMPARISON:  07/02/2023 FINDINGS: Large hiatal hernia. Cardiomegaly. Tortuous aorta with calcifications. Bibasilar opacities, favor atelectasis. No effusions. No acute bony abnormality. IMPRESSION: Bibasilar atelectasis. Mild cardiomegaly. Large hiatal hernia. Electronically Signed   By: Charlett Nose M.D.   On: 07/06/2023 23:09     Medical Consultants:   None.   Subjective:    [name] she relates her pain is controlled and some burgundy/reddish stools  Objective:    Vitals:   07/06/23 2300 07/07/23 0037 07/07/23 0137 07/07/23 0602  BP: (!) 118/56 (!) 142/65 (!) 144/75 (!) 151/66  Pulse: 73  69 73 73  Resp: 20 (!) 22 18 16   Temp:  (!) 97.4 F (36.3 C) (!) 97.5 F (36.4 C) 98.6 F (37 C)  TempSrc:  Oral Oral   SpO2: 94% 97% 95% 93%  Weight:      Height:       SpO2: 93 % O2 Flow Rate (L/min): 6 L/min  No intake or output data in the 24 hours ending 07/07/23 0703 Filed Weights   07/06/23 2121  Weight: 64 kg     Exam: General exam: In no acute distress, traumatic face, nasal bone looks abnormal her face is bruised Respiratory system: Good air movement and clear to auscultation. Cardiovascular system: S1 & S2 heard, RRR.  Gastrointestinal system: Abdomen is nondistended, soft and nontender.  Extremities: No pedal edema. Skin: No rashes, lesions or ulcers Psychiatry: Judgement and insight appear normal.    Data Reviewed:    Labs: Basic Metabolic Panel: Recent Labs  Lab 07/06/23 2150 07/07/23 0319  NA 136 138  K 4.7 4.5  CL 100 103  CO2 27 25  GLUCOSE 177* 137*  BUN 19 20  CREATININE 1.03* 1.06*  CALCIUM 9.0 8.7*   GFR Estimated Creatinine Clearance: 29.8 mL/min (A) (by C-G formula based on SCr of 1.06 mg/dL (H)). Liver Function Tests: Recent Labs  Lab 07/06/23 2150  AST 21  ALT 17  ALKPHOS 48  BILITOT 0.6  PROT 7.0  ALBUMIN 3.9   No results for input(s): "LIPASE", "AMYLASE" in the last 168 hours. No results for input(s): "AMMONIA" in the last 168 hours. Coagulation profile Recent Labs  Lab 07/06/23 2150  INR 1.3*   COVID-19 Labs  No results for input(s): "DDIMER", "FERRITIN", "LDH", "CRP" in the last 72 hours.  Lab Results  Component Value Date   SARSCOV2NAA NEGATIVE 01/02/2021   SARSCOV2NAA NEGATIVE 04/23/2019    CBC: Recent Labs  Lab 07/06/23 2150 07/07/23 0319  WBC 6.8 6.7  NEUTROABS 5.0  --   HGB 12.4 10.9*  HCT 39.7 33.8*  MCV 98.8 96.6  PLT 184 158   Cardiac Enzymes: No results for input(s): "CKTOTAL", "CKMB", "CKMBINDEX", "TROPONINI" in the last 168 hours. BNP (last 3 results) No results for input(s): "PROBNP" in the last 8760 hours. CBG: No results for input(s): "GLUCAP" in the last 168 hours. D-Dimer: No results for input(s): "DDIMER" in the last 72 hours. Hgb A1c: No results for input(s): "HGBA1C" in the last 72 hours. Lipid Profile: No results for input(s): "CHOL", "HDL", "LDLCALC", "TRIG", "CHOLHDL", "LDLDIRECT" in the last 72  hours. Thyroid function studies: No results for input(s): "TSH", "T4TOTAL", "T3FREE", "THYROIDAB" in the last 72 hours.  Invalid input(s): "FREET3" Anemia work up: No results for input(s): "VITAMINB12", "FOLATE", "FERRITIN", "TIBC", "IRON", "RETICCTPCT" in the last 72 hours. Sepsis Labs: Recent Labs  Lab 07/06/23 2150 07/07/23 0319  WBC 6.8 6.7   Microbiology No results found for this or any previous visit (from the past 240 hours).   Medications:    amiodarone  200 mg Oral Daily   calcium-vitamin D  1 tablet Oral Daily   furosemide  40 mg Oral Daily   gabapentin  300 mg Oral QHS   metoprolol succinate  25 mg Oral Daily   multivitamin with minerals  1 tablet Oral Daily   pantoprazole  40 mg Oral Daily   potassium chloride SA  20 mEq Oral Daily   pravastatin  20 mg Oral QHS   Continuous Infusions:    LOS: 1 day   Darin Engels  David Stall  [facility]  07/07/2023, 7:03 AM

## 2023-07-07 NOTE — Consult Note (Signed)
 Reason for Consult:epistaxis Referring Physician: Dr Debarah Crape is an 89 y.o. female.  HPI: hx of fall 4 days ago and now started having nose bleeding from the left side. She was packed at [facility] and now has stopped. She has no further blood per mouth. She is on Eloquis.   Past Medical History:  Diagnosis Date   Anemia    Ascending aortic aneurysm (HCC)    a. 4.6cm by CT 07/2016.   Chronic diastolic CHF (congestive heart failure) (HCC)    Coronary artery calcification seen on CT scan 07/2016   History of blood transfusion    History of kidney stones    Hypertension    Hypomagnesemia    PAF (paroxysmal atrial fibrillation) (HCC)    a. dx during adm 08/2016.   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   Negative Jak2, BCR/ABL, normal epo level on 02/01/2014   Sepsis (HCC) 08/2016   Thrombocytopenia (HCC)    Trigeminal neuralgia     Past Surgical History:  Procedure Laterality Date   BREAST CYST EXCISION  60 yrs ago   COLONOSCOPY WITH ESOPHAGOGASTRODUODENOSCOPY (EGD) N/A 03/10/2013   Rehman: normal except few diverticula in simoid   CYSTOSCOPY W/ URETERAL STENT PLACEMENT Right 01/02/2021   Procedure: CYSTOSCOPY WITH RETROGRADE PYELOGRAM/URETERAL STENT PLACEMENT;  Surgeon: Malen Gauze, MD;  Location: [facility];  Service: Urology;  Laterality: Right;   CYSTOSCOPY WITH RETROGRADE PYELOGRAM, URETEROSCOPY AND STENT PLACEMENT Right 01/19/2021   Procedure: CYSTOSCOPY WITH RETROGRADE PYELOGRAM, URETEROSCOPY AND STENT EXCHANGE;  Surgeon: Malen Gauze, MD;  Location: [facility];  Service: Urology;  Laterality: Right;   ESOPHAGEAL DILATION N/A 03/08/2021   Procedure: ESOPHAGEAL DILATION;  Surgeon: Malissa Hippo, MD;  [facility];  Service: Endoscopy;  Laterality: N/A;   ESOPHAGOGASTRODUODENOSCOPY  2014   Erosive reflux esophagitis with stricture at GE junction which was dilated with a balloon dilator to 18 mm. Moderate size sliding hiatal hernia. small ulcer at  gastric body along with antral erosions   ESOPHAGOGASTRODUODENOSCOPY (EGD) WITH PROPOFOL N/A 03/08/2021   Procedure: ESOPHAGOGASTRODUODENOSCOPY (EGD) WITH PROPOFOL;  Surgeon: Malissa Hippo, MD;  [facility];  Service: Endoscopy;  Laterality: N/A;  9:20   EXTRACORPOREAL SHOCK WAVE LITHOTRIPSY Right 09/20/2016   Procedure: RIGHT EXTRACORPOREAL SHOCK WAVE LITHOTRIPSY (ESWL);  Surgeon: Alfredo Martinez, MD;  Location: [facility];  Service: Urology;  Laterality: Right;   EXTRACORPOREAL SHOCK WAVE LITHOTRIPSY Right 02/16/2019   Procedure: EXTRACORPOREAL SHOCK WAVE LITHOTRIPSY (ESWL);  Surgeon: Marcine Matar, MD;  Location: [facility];  Service: Urology;  Laterality: Right;   EXTRACORPOREAL SHOCK WAVE LITHOTRIPSY Right 04/27/2019   Procedure: EXTRACORPOREAL SHOCK WAVE LITHOTRIPSY (ESWL);  Surgeon: Marcine Matar, MD;  Location: [facility];  Service: Urology;  Laterality: Right;   EYE SURGERY     cataract surgery bilat    Gamma Knife     Trigeminal    HOLMIUM LASER APPLICATION Right 01/19/2021   Procedure: HOLMIUM LASER APPLICATION;  Surgeon: Malen Gauze, MD;  Location: [facility];  Service: Urology;  Laterality: Right;   SLT LASER APPLICATION Left 09/27/2014   Procedure: SLT LASER APPLICATION;  Surgeon: Susa Simmonds, MD;  Location: [facility];  Service: Ophthalmology;  Laterality: Left;    Family History  Problem Relation Age of Onset   Congestive Heart Failure Mother    Heart attack Father    CAD Neg Hx     Social History:  reports that she has never smoked. She has never been exposed  to tobacco smoke. She has never used smokeless tobacco. She reports that she does not drink alcohol and does not use drugs.  Allergies:  Allergies  Allergen Reactions   Sulfa Antibiotics Other (See Comments)    Pt states that this med makes her feel crazy.      Medications: I have reviewed the patient's current medications.  Results for orders placed or performed during the hospital  encounter of 07/06/23 (from the past 48 hours)  Protime-INR     Status: Abnormal   Collection Time: 07/06/23  9:50 PM  Result Value Ref Range   Prothrombin Time 16.4 (H) 11.4 - 15.2 seconds   INR 1.3 (H) 0.8 - 1.2    Comment: (NOTE) INR goal varies based on device and disease states. Performed at [facility], [address], [place], [place] [address]   CBC with Differential     Status: None   Collection Time: 07/06/23  9:50 PM  Result Value Ref Range   WBC 6.8 4.0 - 10.5 K/uL   RBC 4.02 3.87 - 5.11 MIL/uL   Hemoglobin 12.4 12.0 - 15.0 g/dL   HCT 57.8 46.9 - 62.9 %   MCV 98.8 80.0 - 100.0 fL   MCH 30.8 26.0 - 34.0 pg   MCHC 31.2 30.0 - 36.0 g/dL   RDW 52.8 41.3 - 24.4 %   Platelets 184 150 - 400 K/uL   nRBC 0.0 0.0 - 0.2 %   Neutrophils Relative % 73 %   Neutro Abs 5.0 1.7 - 7.7 K/uL   Lymphocytes Relative 15 %   Lymphs Abs 1.0 0.7 - 4.0 K/uL   Monocytes Relative 10 %   Monocytes Absolute 0.7 0.1 - 1.0 K/uL   Eosinophils Relative 1 %   Eosinophils Absolute 0.1 0.0 - 0.5 K/uL   Basophils Relative 1 %   Basophils Absolute 0.0 0.0 - 0.1 K/uL   Immature Granulocytes 0 %   Abs Immature Granulocytes 0.02 0.00 - 0.07 K/uL    Comment: Performed at [facility], [address] [place], [place] [address]  Comprehensive metabolic panel     Status: Abnormal   Collection Time: 07/06/23  9:50 PM  Result Value Ref Range   Sodium 136 135 - 145 mmol/L   Potassium 4.7 3.5 - 5.1 mmol/L   Chloride 100 98 - 111 mmol/L   CO2 27 22 - 32 mmol/L   Glucose, Bld 177 (H) 70 - 99 mg/dL    Comment: Glucose reference range applies only to samples taken after fasting for at least 8 hours.   BUN 19 8 - 23 mg/dL   Creatinine, Ser 2.53 (H) 0.44 - 1.00 mg/dL   Calcium 9.0 8.9 - 66.4 mg/dL   Total Protein 7.0 6.5 - 8.1 g/dL   Albumin 3.9 3.5 - 5.0 g/dL   AST 21 15 - 41 U/L   ALT 17 0 - 44 U/L   Alkaline Phosphatase 48 38 - 126 U/L   Total Bilirubin 0.6 0.0 - 1.2 mg/dL   GFR, Estimated 51 (L) >60  mL/min    Comment: (NOTE) Calculated using the CKD-EPI Creatinine Equation (2021)    Anion gap 9 5 - 15    Comment: Performed at [facility], [address], [place], [place] [address]  Type and screen     Status: None   Collection Time: 07/06/23  9:50 PM  Result Value Ref Range   ABO/RH(D) A NEG    Antibody Screen NEG    Sample Expiration  07/09/2023,2359 Performed at [facility], [address], [place], [place] [address]   Basic metabolic panel     Status: Abnormal   Collection Time: 07/07/23  3:19 AM  Result Value Ref Range   Sodium 138 135 - 145 mmol/L   Potassium 4.5 3.5 - 5.1 mmol/L   Chloride 103 98 - 111 mmol/L   CO2 25 22 - 32 mmol/L   Glucose, Bld 137 (H) 70 - 99 mg/dL    Comment: Glucose reference range applies only to samples taken after fasting for at least 8 hours.   BUN 20 8 - 23 mg/dL   Creatinine, Ser 6.04 (H) 0.44 - 1.00 mg/dL   Calcium 8.7 (L) 8.9 - 10.3 mg/dL   GFR, Estimated 49 (L) >60 mL/min    Comment: (NOTE) Calculated using the CKD-EPI Creatinine Equation (2021)    Anion gap 10 5 - 15    Comment: Performed at [facility], [address], [place], [place] [address]  CBC     Status: Abnormal   Collection Time: 07/07/23  3:19 AM  Result Value Ref Range   WBC 6.7 4.0 - 10.5 K/uL   RBC 3.50 (L) 3.87 - 5.11 MIL/uL   Hemoglobin 10.9 (L) 12.0 - 15.0 g/dL   HCT 11.9 (L) 14.7 - 82.9 %   MCV 96.6 80.0 - 100.0 fL   MCH 31.1 26.0 - 34.0 pg   MCHC 32.2 30.0 - 36.0 g/dL   RDW 56.2 13.0 - 86.5 %   Platelets 158 150 - 400 K/uL   nRBC 0.0 0.0 - 0.2 %    Comment: Performed at [facility], [address], [place], [place] [address]    DG Chest Port 1 View Result Date: 07/06/2023 CLINICAL DATA:  Shortness of breath, hypoxia EXAM: PORTABLE CHEST 1 VIEW COMPARISON:  07/02/2023 FINDINGS: Large hiatal hernia. Cardiomegaly. Tortuous aorta with calcifications. Bibasilar opacities, favor atelectasis. No effusions. No acute bony abnormality.  IMPRESSION: Bibasilar atelectasis. Mild cardiomegaly. Large hiatal hernia. Electronically Signed   By: Charlett Nose M.D.   On: 07/06/2023 23:09    ROS Blood pressure 109/67, pulse 74, temperature 98.6 F (37 C), temperature source Oral, resp. rate 16, height 5\' 2"  (1.575 m), weight 64 kg, SpO2 93%. Physical Exam HENT:     Right Ear: External ear normal.     Left Ear: External ear normal.     Nose:     Comments: Abrasions on the dorsum. Left with rhinorocket and no bleeding. No spitting up blood. Extensive bruising on face from gravity    Mouth/Throat:     Mouth: Mucous membranes are moist.  Eyes:     Pupils: Pupils are equal, round, and reactive to light.  Musculoskeletal:     Cervical back: Normal range of motion. No rigidity.  Neurological:     Mental Status: She is alert.       Assessment/Plan: Epistaxis- she has stopped bleeding and needs to keep packing for about 4 days. She can follow up in the office for removal. Call [phone]. Call if further bleeding. Antibiotics while pack iis in.  Suzanna Obey 07/07/2023, 10:39 AM

## 2023-07-07 NOTE — Evaluation (Addendum)
 Physical Therapy Evaluation Patient Details Name: [name] MRN: [id number] DOB: 1928/09/27 Today's Date: 07/07/2023  History of Present Illness  The pt is a 89 yo female presenting 3/15 with nose bleed. Pt also seen in ED on 3/11 after a fall with comminuted fracture of the nasal bones, d/c home same day. PMH includes: AAA, HTN, afib on Eliquis, CHF, polycythemia and thrombocytopenia.  Clinical Impression  Pt in bed upon arrival of PT, agreeable to evaluation at this time. Prior to admission the pt was living alone, but with frequent assist from her neighbor and children. Before her fall on the 11th, she reports intermittent use of RW, but she has been using more consistently since d/c on 11th. The pt presents today with limitations in functional mobility, strength, power, stability, and endurance due to above dx, and will continue to benefit from skilled PT to address these deficits. She was able to complete sit-stand and short bouts of ambulation with minA to CGA with use of RW, but benefits from cues for hand placement and posture/positioning in the RW. The pt will benefit from continued skilled PT to progress functional mobility, independence, and balance, but is likely safe to return home with increased support from family and neighbor initially (pt reports they will be able to assist).          If plan is discharge home, recommend the following: A little help with walking and/or transfers;A little help with bathing/dressing/bathroom;Assistance with cooking/housework;Direct supervision/assist for medications management;Direct supervision/assist for financial management;Help with stairs or ramp for entrance;Assist for transportation;Supervision due to cognitive status   Can travel by private vehicle        Equipment Recommendations Other (comment) (shower chair)  Recommendations for Other Services       Functional Status Assessment Patient has had a recent decline in their functional  status and demonstrates the ability to make significant improvements in function in a reasonable and predictable amount of time.     Precautions / Restrictions Precautions Precautions: Fall Recall of Precautions/Restrictions: Intact Restrictions Weight Bearing Restrictions Per Provider Order: No      Mobility  Bed Mobility Overal bed mobility: Modified Independent             General bed mobility comments: increased time    Transfers Overall transfer level: Needs assistance Equipment used: Rolling walker (2 wheels) Transfers: Sit to/from Stand, Bed to chair/wheelchair/BSC Sit to Stand: Min assist, Contact guard assist   Step pivot transfers: Contact guard assist       General transfer comment: minA from EOB, cues for hand placement, CGA wiht slow rise form recliner. cues for hand placement for each transfer    Ambulation/Gait Ambulation/Gait assistance: Contact guard assist Gait Distance (Feet): 5 Feet (+ 32ft) Assistive device: Rolling walker (2 wheels) Gait Pattern/deviations: Step-through pattern, Decreased stride length, Trunk flexed Gait velocity: slow but pt reports near her baseline Gait velocity interpretation: <1.31 ft/sec, indicative of household ambulator   General Gait Details: pt with small steps and maintains RW in front of her despite cues. no overt LOB or need for physical assistance. cues to maintain attention on goal    Balance Overall balance assessment: Mild deficits observed, not formally tested                                           Pertinent Vitals/Pain Pain Assessment Pain Assessment: Faces Faces  Pain Scale: Hurts little more Pain Location: nose Pain Descriptors / Indicators: Discomfort Pain Intervention(s): Limited activity within patient's tolerance, Monitored during session, Repositioned    Home Living Family/patient expects to be discharged to:: Private residence Living Arrangements: Alone Available Help at  Discharge: Family;Neighbor;Available PRN/intermittently Type of Home: House Home Access: Stairs to enter Entrance Stairs-Rails: Right Entrance Stairs-Number of Steps: 3   Home Layout: One level (single step up to bedroom) Home Equipment: Agricultural consultant (2 wheels);Lift chair;Wheelchair - manual Additional Comments: HH nurse 1x/month for about an hour for medication review    Prior Function Prior Level of Function : History of Falls (last six months);Needs assist             Mobility Comments: before fall, pt reports intermittent use of DME, has been using RW after fall ADLs Comments: reports daughter comes daily to help pt dress, son and daughter-in-law assist with cooking, neighbor does grocery shopping     Extremity/Trunk Assessment   Upper Extremity Assessment Upper Extremity Assessment: Defer to OT evaluation;RUE deficits/detail RUE Deficits / Details: brusing R wrist    Lower Extremity Assessment Lower Extremity Assessment: Generalized weakness (grossly 4-/5 to MMT, bruising posterior surface R knee)    Cervical / Trunk Assessment Cervical / Trunk Assessment: Other exceptions Cervical / Trunk Exceptions: bruising to mid lumbar spine  Communication   Communication Communication: Impaired Factors Affecting Communication: Hearing impaired    Cognition Arousal: Alert Behavior During Therapy: WFL for tasks assessed/performed   PT - Cognitive impairments: No family/caregiver present to determine baseline, Problem solving, Safety/Judgement                       PT - Cognition Comments: pt alert and oriented x4, at times answering questions inappropriately, question if due to hearing issues. for example: pt asked why she felt she was unable to walk further in session, pt started detailing what time her son comes to assist. attempted to ask again x2 but pt continued telling PT about her son visiting. Following commands: Intact       Cueing Cueing Techniques:  Verbal cues     General Comments General comments (skin integrity, edema, etc.): bruising to R wrist, back of R knee, mid low back, and face        Assessment/Plan    PT Assessment Patient needs continued PT services  PT Problem List Decreased activity tolerance;Decreased balance;Decreased strength;Decreased mobility;Pain       PT Treatment Interventions DME instruction;Gait training;Stair training;Functional mobility training;Therapeutic activities;Therapeutic exercise;Balance training;Patient/family education    PT Goals (Current goals can be found in the Care Plan section)  Acute Rehab PT Goals Patient Stated Goal: return home PT Goal Formulation: With patient Time For Goal Achievement: 07/21/23 Potential to Achieve Goals: Good    Frequency Min 2X/week        AM-PAC PT "6 Clicks" Mobility  Outcome Measure Help needed turning from your back to your side while in a flat bed without using bedrails?: None Help needed moving from lying on your back to sitting on the side of a flat bed without using bedrails?: A Little Help needed moving to and from a bed to a chair (including a wheelchair)?: A Little Help needed standing up from a chair using your arms (e.g., wheelchair or bedside chair)?: A Little Help needed to walk in hospital room?: A Little Help needed climbing 3-5 steps with a railing? : A Lot 6 Click Score: 18    End of  Session Equipment Utilized During Treatment: Gait belt Activity Tolerance: Patient tolerated treatment well Patient left: in chair;with call bell/phone within reach;with chair alarm set Nurse Communication: Mobility status PT Visit Diagnosis: Unsteadiness on feet (R26.81);Other abnormalities of gait and mobility (R26.89);Muscle weakness (generalized) (M62.81);History of falling (Z91.81);Pain Pain - part of body:  (nose)    Time: 4132-4401 PT Time Calculation (min) (ACUTE ONLY): 39 min   Charges:   PT Evaluation $PT Eval Moderate Complexity: 1  Mod PT Treatments $Gait Training: 8-22 mins $Therapeutic Activity: 8-22 mins PT General Charges $$ ACUTE PT VISIT: 1 Visit         Vickki Muff, PT, DPT   Acute Rehabilitation Department Office [phone] Secure Chat Communication Preferred  Ronnie Derby 07/07/2023, 5:12 PM

## 2023-07-07 NOTE — ED Notes (Signed)
Report received from care link.

## 2023-07-07 NOTE — Progress Notes (Addendum)
       Code Status/Type confirmation of interventions  NAME: [name] MRN: [id number] DOB : 18-Oct-1928  Received Notification to Remote coverage.  Date of Service   07/07/2023   HPI/Events of Note   Notified by RN for Patient and Family request to be DNR/DNI.Marland Kitchen   The following were discussed with patient/family member by telephone in the presence of the RN.    Patient and family has verified :   CPR  -  No Shock - No ACLS - No Vent -   No                           BiPAP - Yes                         Vasopressors - Yes    Interventions   Plan: Confirmed status change and interventions allowable         [facility] [place]

## 2023-07-07 NOTE — ED Notes (Signed)
 Dr. Jearld Fenton ENT was spoke to on phone. Stated he would come in hemorraging is not controlled. Bleeding controlled at this time.

## 2023-07-08 DIAGNOSIS — I5032 Chronic diastolic (congestive) heart failure: Secondary | ICD-10-CM | POA: Diagnosis not present

## 2023-07-08 DIAGNOSIS — R040 Epistaxis: Secondary | ICD-10-CM | POA: Diagnosis not present

## 2023-07-08 DIAGNOSIS — Z9181 History of falling: Secondary | ICD-10-CM | POA: Diagnosis not present

## 2023-07-08 DIAGNOSIS — I480 Paroxysmal atrial fibrillation: Secondary | ICD-10-CM | POA: Diagnosis not present

## 2023-07-08 LAB — CBC
HCT: 35.1 % — ABNORMAL LOW (ref 36.0–46.0)
Hemoglobin: 11.3 g/dL — ABNORMAL LOW (ref 12.0–15.0)
MCH: 31.2 pg (ref 26.0–34.0)
MCHC: 32.2 g/dL (ref 30.0–36.0)
MCV: 97 fL (ref 80.0–100.0)
Platelets: 178 10*3/uL (ref 150–400)
RBC: 3.62 MIL/uL — ABNORMAL LOW (ref 3.87–5.11)
RDW: 14.6 % (ref 11.5–15.5)
WBC: 7.9 10*3/uL (ref 4.0–10.5)
nRBC: 0 % (ref 0.0–0.2)

## 2023-07-08 MED ORDER — AMOXICILLIN-POT CLAVULANATE 500-125 MG PO TABS
1.0000 | ORAL_TABLET | Freq: Two times a day (BID) | ORAL | Status: DC
  Administered 2023-07-08 – 2023-07-09 (×3): 1 via ORAL
  Filled 2023-07-08 (×4): qty 1

## 2023-07-08 NOTE — Plan of Care (Signed)
  Problem: Education: Goal: Knowledge of General Education information will improve Description: Including pain rating scale, medication(s)/side effects and non-pharmacologic comfort measures Outcome: Progressing   Problem: Nutrition: Goal: Adequate nutrition will be maintained Outcome: Progressing   Problem: Coping: Goal: Level of anxiety will decrease Outcome: Progressing   Problem: Elimination: Goal: Will not experience complications related to bowel motility Outcome: Progressing   Problem: Pain Managment: Goal: General experience of comfort will improve and/or be controlled Outcome: Progressing   Problem: Safety: Goal: Ability to remain free from injury will improve Outcome: Progressing   Problem: Skin Integrity: Goal: Risk for impaired skin integrity will decrease Outcome: Progressing

## 2023-07-08 NOTE — Progress Notes (Signed)
 [facility] PROGRESS NOTE    Progress Note  [name]  ZOX:[id number] DOB: July 22, 1928 DOA: 07/06/2023 PCP: [facility]   Brief Narrative:   [name] is an 89 y.o. female past medical history significant for ascending aortic aneurysm, essential hypertension, paroxysmal atrial fibrillation on Eliquis, polycythemia and thrombocytopenia, presents with severe epistaxis.  Patient had a mechanical fall on 07/02/2023 had a facial comminuted fracture of the nasal bone, has an open wound at the bridge of the nose that had intermittent bleeding difficult to control with local pressure at home, her last dose of Eliquis was on the morning of admission.  Started having severe bleeding in the afternoon  Assessment/Plan:   Epistaxis in the setting of Eliquis use: Packing in place and bleeding has subsided . Continue to hold Eliquis  ENT recommended to continue current management and started on antibiotics follow-up with them as an outpatient. She was having burgundy stools, this is now resolved.  There is probably coming from her epistaxis.  However her bleeding has subsided as her stools are now color brown.  PAF (paroxysmal atrial fibrillation) (HCC) Continue amiodarone and metoprolol for rate control. In sinus rhythm hold Eliquis.  Chronic diastolic CHF (congestive heart failure) (HCC) Appears compensated continue amiodarone metoprolol and furosemide.  Essential hypertension, benign Blood pressure is well-controlled.  Hyperlipidemia Continue statins.  Polycythemia, secondary Mild drop in hemoglobin from 12.4-10.9.     DVT prophylaxis: scd Family Communication:son Status is: Inpatient Remains inpatient appropriate because: Epistaxis    Code Status:     Code Status Orders  (From admission, onward)           Start     Ordered   07/07/23 0532  Do not attempt resuscitation (DNR)- Limited -Do Not Intubate (DNI)  (Code Status)  Continuous        Question Answer Comment  If pulseless and not breathing No CPR or chest compressions.   In Pre-Arrest Conditions (Patient Is Breathing and Has A Pulse) Do not intubate. Provide all appropriate non-invasive medical interventions. Avoid ICU transfer unless indicated or required.   Consent: Discussion documented in EHR or advanced directives reviewed      07/07/23 0532           Code Status History     Date Active Date Inactive Code Status Order ID Comments User Context   07/06/2023 2311 07/07/2023 0532 Full Code [id number]  Coralie Keens, MD ED   01/02/2021 0757 01/08/2021 0022 Full Code [id number]  Frankey Shown, DO ED   08/20/2016 1854 08/25/2016 2050 Full Code [id number]  Pearson Grippe, MD Inpatient         IV Access:   Peripheral IV   Procedures and diagnostic studies:   DG Chest Port 1 View Result Date: 07/06/2023 CLINICAL DATA:  Shortness of breath, hypoxia EXAM: PORTABLE CHEST 1 VIEW COMPARISON:  07/02/2023 FINDINGS: Large hiatal hernia. Cardiomegaly. Tortuous aorta with calcifications. Bibasilar opacities, favor atelectasis. No effusions. No acute bony abnormality. IMPRESSION: Bibasilar atelectasis. Mild cardiomegaly. Large hiatal hernia. Electronically Signed   By: Charlett Nose M.D.   On: 07/06/2023 23:09     Medical Consultants:   None.   Subjective:    [name] she relates her stools are now brown.  Objective:    Vitals:   07/07/23 1214 07/07/23 2027 07/08/23 0522 07/08/23 0815  BP: (!) 132/46 (!) 116/57 (!) 99/50 (!) 124/43  Pulse: 70 72 69 62  Resp: 16  16 16   Temp:  98.6 F (37 C) 97.6 F (36.4 C) 98.6 F (37 C) 97.7 F (36.5 C)  TempSrc:  Oral  Oral  SpO2: 96% 98% 96% 98%  Weight:      Height:       SpO2: 98 % O2 Flow Rate (L/min): 6 L/min  No intake or output data in the 24 hours ending 07/08/23 1016 Filed Weights   07/06/23 2121  Weight: 64 kg    Exam: General exam: In no acute distress, traumatic face, nasal bone looks  abnormal her face is bruised Respiratory system: Good air movement and clear to auscultation. Cardiovascular system: S1 & S2 heard, RRR. No JVD. Gastrointestinal system: Abdomen is nondistended, soft and nontender.  Extremities: No pedal edema. Skin: No rashes, lesions or ulcers Psychiatry: Judgement and insight appear normal. Mood & affect appropriate.   Data Reviewed:    Labs: Basic Metabolic Panel: Recent Labs  Lab 07/06/23 2150 07/07/23 0319  NA 136 138  K 4.7 4.5  CL 100 103  CO2 27 25  GLUCOSE 177* 137*  BUN 19 20  CREATININE 1.03* 1.06*  CALCIUM 9.0 8.7*   GFR Estimated Creatinine Clearance: 29.8 mL/min (A) (by C-G formula based on SCr of 1.06 mg/dL (H)). Liver Function Tests: Recent Labs  Lab 07/06/23 2150  AST 21  ALT 17  ALKPHOS 48  BILITOT 0.6  PROT 7.0  ALBUMIN 3.9   No results for input(s): "LIPASE", "AMYLASE" in the last 168 hours. No results for input(s): "AMMONIA" in the last 168 hours. Coagulation profile Recent Labs  Lab 07/06/23 2150  INR 1.3*   COVID-19 Labs  No results for input(s): "DDIMER", "FERRITIN", "LDH", "CRP" in the last 72 hours.  Lab Results  Component Value Date   SARSCOV2NAA NEGATIVE 01/02/2021   SARSCOV2NAA NEGATIVE 04/23/2019    CBC: Recent Labs  Lab 07/06/23 2150 07/07/23 0319  WBC 6.8 6.7  NEUTROABS 5.0  --   HGB 12.4 10.9*  HCT 39.7 33.8*  MCV 98.8 96.6  PLT 184 158   Cardiac Enzymes: No results for input(s): "CKTOTAL", "CKMB", "CKMBINDEX", "TROPONINI" in the last 168 hours. BNP (last 3 results) No results for input(s): "PROBNP" in the last 8760 hours. CBG: No results for input(s): "GLUCAP" in the last 168 hours. D-Dimer: No results for input(s): "DDIMER" in the last 72 hours. Hgb A1c: No results for input(s): "HGBA1C" in the last 72 hours. Lipid Profile: No results for input(s): "CHOL", "HDL", "LDLCALC", "TRIG", "CHOLHDL", "LDLDIRECT" in the last 72 hours. Thyroid function studies: No results for  input(s): "TSH", "T4TOTAL", "T3FREE", "THYROIDAB" in the last 72 hours.  Invalid input(s): "FREET3" Anemia work up: No results for input(s): "VITAMINB12", "FOLATE", "FERRITIN", "TIBC", "IRON", "RETICCTPCT" in the last 72 hours. Sepsis Labs: Recent Labs  Lab 07/06/23 2150 07/07/23 0319  WBC 6.8 6.7   Microbiology No results found for this or any previous visit (from the past 240 hours).   Medications:    amiodarone  200 mg Oral Daily   amoxicillin-clavulanate  1 tablet Oral Q12H   calcium-vitamin D  1 tablet Oral Daily   furosemide  40 mg Oral Daily   gabapentin  300 mg Oral QHS   metoprolol succinate  25 mg Oral Daily   multivitamin with minerals  1 tablet Oral Daily   pantoprazole  40 mg Oral Daily   potassium chloride SA  20 mEq Oral Daily   pravastatin  20 mg Oral QHS   Continuous Infusions:    LOS: 2 days  Marinda Elk  [facility]  07/08/2023, 10:16 AM

## 2023-07-08 NOTE — Evaluation (Signed)
 Occupational Therapy Evaluation Patient Details Name: [name] MRN: [id number] DOB: [birth year] Today's Date: 07/08/2023   History of Present Illness   The pt is a 89 yo female presenting 3/15 with nose bleed. Pt also seen in ED on 3/11 after a fall with comminuted fracture of the nasal bones, d/c home same day. PMH includes: AAA, HTN, afib on Eliquis, CHF, polycythemia and thrombocytopenia.     Clinical Impressions Pt currently is at min assist level for toilet transfers and toileting with use of the RW for support.  Min to mod for LB selfcare sit to stand.  Prior to admission pt had history of falls and was needing some assist from her daughter, son, and neighbor who all live near her.  Feel she will benefit from acute care OT to help increase balance, safety, and ADL independence so she can return home at a safer level with assist from her family.  Feel HHOT will be beneficial in helping to continue progression post acute stay.     If plan is discharge home, recommend the following:   A little help with walking and/or transfers;A little help with bathing/dressing/bathroom;Assistance with cooking/housework;Assist for transportation;Direct supervision/assist for medications management;Help with stairs or ramp for entrance     Functional Status Assessment   Patient has had a recent decline in their functional status and demonstrates the ability to make significant improvements in function in a reasonable and predictable amount of time.     Equipment Recommendations   Tub/shower bench;Other (comment) (If pt/family will agree tub bench would be beneficial.  She has shower seat, but still with difficulty stepping into the shower)      Precautions/Restrictions   Precautions Precautions: Fall Recall of Precautions/Restrictions: Intact Restrictions Weight Bearing Restrictions Per Provider Order: No     Mobility Bed Mobility Overal bed mobility: Needs Assistance Bed  Mobility: Supine to Sit     Supine to sit: Supervision     General bed mobility comments: Increased time with min instructional cueing to sequence    Transfers Overall transfer level: Needs assistance Equipment used: Rolling walker (2 wheels) Transfers: Sit to/from Stand, Bed to chair/wheelchair/BSC Sit to Stand: Min assist     Step pivot transfers: Min assist     General transfer comment: Min assist for sit to stand from the EOB with min instructional cueing for hand placement.      Balance Overall balance assessment: Needs assistance Sitting-balance support: No upper extremity supported, Feet supported Sitting balance-Leahy Scale: Fair     Standing balance support: Bilateral upper extremity supported, During functional activity Standing balance-Leahy Scale: Poor Standing balance comment: Pt needs UE support during mobility with use of the RW.                           ADL either performed or assessed with clinical judgement   ADL Overall ADL's : Needs assistance/impaired Eating/Feeding: Set up;Sitting   Grooming: Wash/dry hands;Standing;Contact guard assist   Upper Body Bathing: Supervision/ safety;Sitting   Lower Body Bathing: Minimal assistance;Sit to/from stand   Upper Body Dressing : Minimal assistance;Sitting   Lower Body Dressing: Moderate assistance;Sit to/from stand   Toilet Transfer: Minimal assistance;Grab bars;Comfort height toilet;Ambulation;Rolling walker (2 wheels)   Toileting- Clothing Manipulation and Hygiene: Minimal assistance;Sit to/from stand       Functional mobility during ADLs: Minimal assistance;Rolling walker (2 wheels) (ambulation to the bathroom) General ADL Comments: Pt's daughter present and reports being able to provide  some assist post discharge between her and her brother as they live nearby on the same far.  Neighbor also helps her out when needed.  Decreased ability to reach either LE for donning her gripper socks.   At home she reports sitting on the commode to get to her feet.  May benefit from AE trial.  Discussed need for tub bench for use in the tub as she has difficulty stepping into the walk-in shower because it's higher.  Tub bench would be too big for the door opening but she does have a tub/shower that could be used.     Vision Baseline Vision/History: 1 Wears glasses (does not have them with her currently) Ability to See in Adequate Light: 0 Adequate Patient Visual Report: No change from baseline Vision Assessment?: No apparent visual deficits     Perception Perception: Not tested       Praxis Praxis: Not tested       Pertinent Vitals/Pain Pain Assessment Pain Assessment: Faces Faces Pain Scale: Hurts a little bit Pain Location: right arm Pain Descriptors / Indicators: Discomfort Pain Intervention(s): Limited activity within patient's tolerance, Repositioned, Monitored during session     Extremity/Trunk Assessment Upper Extremity Assessment Upper Extremity Assessment: RUE deficits/detail RUE Deficits / Details: right shoulder flexion AROM 0-110 degrees, AAROM 0-150 degrees.  All other joints AROM WFLs with strength 4/5 throughout.  Arthritic changes noted in hands bilaterally but able to tie gown with increased time. RUE Sensation: WNL RUE Coordination: WNL   Lower Extremity Assessment Lower Extremity Assessment: Defer to PT evaluation   Cervical / Trunk Assessment Cervical / Trunk Assessment: Kyphotic Cervical / Trunk Exceptions: flexed trunk and head in standing   Communication Communication Communication: Impaired Factors Affecting Communication: Hearing impaired   Cognition Arousal: Alert Behavior During Therapy: WFL for tasks assessed/performed                                 Following commands: Intact       Cueing  General Comments   Cueing Techniques: Verbal cues      Exercises     Shoulder Instructions      Home Living Family/patient  expects to be discharged to:: Private residence Living Arrangements: Alone Available Help at Discharge: Family;Neighbor;Available PRN/intermittently (family lives nearby (daughter and son) live nearby) Type of Home: House Home Access: Stairs to enter Secretary/administrator of Steps: 3 Entrance Stairs-Rails: Right Home Layout: One level (single step up to bedroom)     Bathroom Shower/Tub: Arts development officer Toilet: Handicapped height     Home Equipment: Agricultural consultant (2 wheels);Lift chair;Wheelchair - manual   Additional Comments: HH nurse 1x/month for about an hour for medication review      Prior Functioning/Environment Prior Level of Function : History of Falls (last six months);Needs assist             Mobility Comments: before fall, pt reports intermittent use of DME, has been using RW after fall ADLs Comments: reports daughter comes daily to help pt dress, son and daughter-in-law assist with cooking, neighbor does grocery shopping    OT Problem List: Decreased strength;Decreased knowledge of use of DME or AE;Decreased range of motion;Impaired balance (sitting and/or standing);Pain;Impaired UE functional use   OT Treatment/Interventions: Self-care/ADL training;Therapeutic exercise;Patient/family education;Balance training;Therapeutic activities;DME and/or AE instruction      OT Goals(Current goals can be found in the care plan section)   Acute Rehab  OT Goals Patient Stated Goal: Get stronger and quit falling OT Goal Formulation: With patient Time For Goal Achievement: 07/22/23 Potential to Achieve Goals: Good   OT Frequency:  Min 2X/week       AM-PAC OT "6 Clicks" Daily Activity     Outcome Measure Help from another person eating meals?: None Help from another person taking care of personal grooming?: A Little Help from another person toileting, which includes using toliet, bedpan, or urinal?: A Little Help from another person bathing (including  washing, rinsing, drying)?: A Little Help from another person to put on and taking off regular upper body clothing?: A Little Help from another person to put on and taking off regular lower body clothing?: A Lot 6 Click Score: 18   End of Session Equipment Utilized During Treatment: Gait belt;Rolling walker (2 wheels) Nurse Communication: Mobility status  Activity Tolerance: Patient tolerated treatment well Patient left: in chair;with call bell/phone within reach;with chair alarm set;with family/visitor present  OT Visit Diagnosis: Unsteadiness on feet (R26.81);Other abnormalities of gait and mobility (R26.89);Repeated falls (R29.6);Muscle weakness (generalized) (M62.81);Pain Pain - Right/Left: Right Pain - part of body: Arm                Time: 1008-1100 OT Time Calculation (min): 52 min Charges:  OT General Charges $OT Visit: 1 Visit OT Evaluation $OT Eval Moderate Complexity: 1 Mod OT Treatments $Self Care/Home Management : 23-37 mins  Perrin Maltese, OTR/L Acute Rehabilitation Services  Office [phone] 07/08/2023

## 2023-07-08 NOTE — TOC Initial Note (Signed)
 Transition of Care (TOC) - Initial/Assessment Note   Spoke to patient and aughter Vicky at bedside. Patient from home alone. Neighbor assists.   PT recommending HHPT, aide  Patient has had HHPT through [facility] in the past and would like [facility] again and the same HHPT if possible.   NCM explained NCM will call [facility] and request the same HHPT, also NCM can ask for an aide. Also patient's insurance has a program that she may be approved for 20 hours ( total) for an aide.   Daughter stated she will arrange an aide and for NCM not to ask for one through [facility] or [facility].   Cory with [facility] accepted referral for HHPT and same HHPT Loraine Leriche)    PT also recommending shower stool. NCM explained NCM will order DME agency will run insurance and let patient and family know if it is covered or not. Both voiced understanding. Daughter requested shower stool not be delivered to patient's room until tomorrow morning. Patient's son plans to spend the night tonight and he will be present in morning. Son has a shower stool that may work. Son will decide tomorrow if they want the shower stool from [facility] or not. Zach with [facility] aware.   Entered orders asked MD to sign  Patient Details  Name: [name] MRN: [id number] Date of Birth: 1928/05/15  Transition of Care [facility]) CM/SW Contact:    Kingsley Plan, RN Phone Number: 07/08/2023, 12:30 PM  Clinical Narrative:                   Expected Discharge Plan: Home w Home Health Services Barriers to Discharge: Continued Medical Work up   Patient Goals and CMS Choice Patient states their goals for this hospitalization and ongoing recovery are:: to return to home CMS Medicare.gov Compare Post Acute Care list provided to:: Patient Choice offered to / list presented to : Patient      Expected Discharge Plan and Services   Discharge Planning Services: CM Consult Post Acute Care Choice: Home Health, Durable Medical Equipment Living arrangements for the  past 2 months: Single Family Home                 DME Arranged: Shower stool DME Agency: [facility] Date DME Agency Contacted: 07/08/23 Time DME Agency Contacted: 1229 Representative spoke with at DME Agency: Zack HH Arranged: PT HH Agency: [facility] Date [facility] Agency Contacted: 07/08/23 Time HH Agency Contacted: 1230 Representative spoke with at [facility] Agency: Kandee Keen  Prior Living Arrangements/Services Living arrangements for the past 2 months: Single Family Home Lives with:: Self Patient language and need for interpreter reviewed:: Yes Do you feel safe going back to the place where you live?: Yes      Need for Family Participation in Patient Care: Yes (Comment) Care giver support system in place?: Yes (comment) Current home services: DME Criminal Activity/Legal Involvement Pertinent to Current Situation/Hospitalization: No - Comment as needed  Activities of Daily Living   ADL Screening (condition at time of admission) Independently performs ADLs?: No Does the patient have a NEW difficulty with bathing/dressing/toileting/self-feeding that is expected to last >3 days?: No Does the patient have a NEW difficulty with getting in/out of bed, walking, or climbing stairs that is expected to last >3 days?: Yes (Initiates electronic notice to provider for possible PT consult) Does the patient have a NEW difficulty with communication that is expected to last >3 days?: No Is the patient deaf or have difficulty  hearing?: No Does the patient have difficulty seeing, even when wearing glasses/contacts?: No Does the patient have difficulty concentrating, remembering, or making decisions?: No  Permission Sought/Granted   Permission granted to share information with : Yes, Verbal Permission Granted  Share Information with NAME: daughter Guy Sandifer  Permission granted to share info w AGENCY: [facility], [facility]        Emotional Assessment Appearance:: Appears stated  age Attitude/Demeanor/Rapport: Engaged Affect (typically observed): Appropriate Orientation: : Oriented to Self, Oriented to Place, Oriented to  Time, Oriented to Situation Alcohol / Substance Use: Not Applicable Psych Involvement: No (comment)  Admission diagnosis:  Epistaxis [R04.0] Left-sided epistaxis [R04.0] Patient Active Problem List   Diagnosis Date Noted   Epistaxis 07/06/2023   Globus sensation 08/13/2022   Phlegm in throat 02/21/2021   History of kidney stones 01/25/2021   Status post peripherally inserted central catheter (PICC) central line placement    UTI (urinary tract infection) 01/02/2021   Hyperglycemia 01/02/2021   Right flank pain 01/02/2021   SIRS (systemic inflammatory response syndrome) (HCC) 01/02/2021   Sepsis due to undetermined organism (HCC) 01/02/2021   Acute pyelonephritis 01/02/2021   GERD (gastroesophageal reflux disease) 03/31/2019   Dysphagia 12/22/2018   Elevated troponin 09/11/2016   PAF (paroxysmal atrial fibrillation) (HCC)    Hypomagnesemia    Hypertension    Chronic diastolic CHF (congestive heart failure) (HCC)    Acute pulmonary edema (HCC)    Sepsis secondary to UTI (HCC) 08/22/2016   Renal calculus, right 08/22/2016   Thrombocytopenia (HCC) 08/22/2016   Acute respiratory failure with hypoxia (HCC) 08/22/2016   Ascending aortic aneurysm (HCC) 08/22/2016   Demand ischemia (HCC) 08/22/2016   Atrial fibrillation with rapid ventricular response (HCC) 08/20/2016   Abnormal liver function    Dyspnea    Coronary artery calcification seen on CT scan 07/22/2016   URI (upper respiratory infection) 04/29/2014   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   Essential hypertension, benign 02/20/2013   Hyperlipidemia 02/20/2013   PCP:  [facility] Pharmacy:   [facility] - [place], [place] - [address] [place] [address] Phone: [phone] Fax: [phone]     Social Drivers of Health  (SDOH) Social History: SDOH Screenings   Food Insecurity: No Food Insecurity (07/07/2023)  Housing: Low Risk  (07/07/2023)  Transportation Needs: No Transportation Needs (07/07/2023)  Utilities: Not At Risk (07/07/2023)  Social Connections: Moderately Integrated (07/07/2023)  Tobacco Use: Low Risk  (07/06/2023)   SDOH Interventions:     Readmission Risk Interventions     No data to display

## 2023-07-08 NOTE — Progress Notes (Signed)
 PHARMACY NOTE:  ANTIMICROBIAL RENAL DOSAGE ADJUSTMENT  Current antimicrobial regimen includes a mismatch between antimicrobial dosage and estimated renal function.  As per policy approved by the Pharmacy & Therapeutics and Medical Executive Committees, the antimicrobial dosage will be adjusted accordingly.  Current antimicrobial dosage:  Augmentin 875 mg q 12h  Renal Function:  Estimated Creatinine Clearance: 29.8 mL/min (A) (by C-G formula based on SCr of 1.06 mg/dL (H)). []      On intermittent HD, scheduled: []      On CRRT    Antimicrobial dosage has been changed to:  500 mg q 12h  Additional comments:   Thank you for allowing pharmacy to be a part of this patient's care.  Daylene Posey, [facility] 07/08/2023 10:19 AM

## 2023-07-09 ENCOUNTER — Other Ambulatory Visit (HOSPITAL_COMMUNITY): Payer: Self-pay

## 2023-07-09 DIAGNOSIS — R040 Epistaxis: Secondary | ICD-10-CM | POA: Diagnosis not present

## 2023-07-09 LAB — CBC
HCT: 30.9 % — ABNORMAL LOW (ref 36.0–46.0)
Hemoglobin: 9.9 g/dL — ABNORMAL LOW (ref 12.0–15.0)
MCH: 30.9 pg (ref 26.0–34.0)
MCHC: 32 g/dL (ref 30.0–36.0)
MCV: 96.6 fL (ref 80.0–100.0)
Platelets: 159 10*3/uL (ref 150–400)
RBC: 3.2 MIL/uL — ABNORMAL LOW (ref 3.87–5.11)
RDW: 14.6 % (ref 11.5–15.5)
WBC: 6.5 10*3/uL (ref 4.0–10.5)
nRBC: 0 % (ref 0.0–0.2)

## 2023-07-09 MED ORDER — AMOXICILLIN-POT CLAVULANATE 500-125 MG PO TABS
1.0000 | ORAL_TABLET | Freq: Two times a day (BID) | ORAL | 0 refills | Status: AC
Start: 2023-07-09 — End: 2023-07-14
  Filled 2023-07-09: qty 10, 5d supply, fill #0

## 2023-07-09 NOTE — Discharge Summary (Addendum)
 Physician Discharge Summary  [name]:[id number] DOB: 1928/07/20 DOA: 07/06/2023  PCP: [facility]  Admit date: 07/06/2023 Discharge date: 07/09/2023  Admitted From: Home Disposition:  Home  Recommendations for Outpatient Follow-up:  Follow up with PCP in 1-2 weeks Please obtain BMP/CBC in one week   Home Health:No Equipment/Devices:None  Discharge Condition:Stable CODE STATUS:Full Diet recommendation: Heart Healthy  Brief/Interim Summary:  89 y.o. female past medical history significant for ascending aortic aneurysm, essential hypertension, paroxysmal atrial fibrillation on Eliquis, polycythemia and thrombocytopenia, presents with severe epistaxis.  Patient had a mechanical fall on 07/02/2023 had a facial comminuted fracture of the nasal bone, has an open wound at the bridge of the nose that had intermittent bleeding difficult to control with local pressure at home, her last dose of Eliquis was on the morning of admission.  Started having severe bleeding in the afternoon   Discharge Diagnoses:  Principal Problem:   Epistaxis Active Problems:   PAF (paroxysmal atrial fibrillation) (HCC)   Chronic diastolic CHF (congestive heart failure) (HCC)   Essential hypertension, benign   Hyperlipidemia   Polycythemia, secondary  Epistaxis in the setting of Eliquis use: Acute respiratory failure with hypoxia has been ruled out Packing was placed in the ED Eliquis was held. ENT was consulted who recommended to start antibiotics and follow-up with them as an outpatient in 2 to 3 days. She started having burgundy stools which resolved. There is likely coming from her severe epistaxis. Hemoglobin was monitored and stabilized at 9-10.  Paroxysmal atrial fibrillation: Continue amiodarone and metoprolol she is currently rate controlled. Hold Eliquis until she sees the ENT doctor.  Chronic diastolic dysfunction: Continue amiodarone metoprolol Lasix.  Essential  hypertension: Well-controlled continue current regimen.  Hyperlipidemia: Continue statins.  Secondary polycythemia: Hemoglobin around 8 follow-up with PCP  Discharge Instructions  Discharge Instructions     Diet - low sodium heart healthy   Complete by: As directed    Increase activity slowly   Complete by: As directed    No wound care   Complete by: As directed       Allergies as of 07/09/2023       Reactions   Sulfa Antibiotics Other (See Comments)   Pt states that this med makes her feel crazy.          Medication List     STOP taking these medications    apixaban 5 MG Tabs tablet Commonly known as: ELIQUIS       TAKE these medications    amiodarone 200 MG tablet Commonly known as: PACERONE TAKE (1) TABLET BY MOUTH DAILY.   amoxicillin-clavulanate 500-125 MG tablet Commonly known as: AUGMENTIN Take 1 tablet by mouth every 12 (twelve) hours for 5 days.   Calcium Carbonate-Vitamin D 600-400 MG-UNIT tablet Take 1 tablet by mouth daily.   Cinnamon 500 MG capsule Take 1,000 mg by mouth every other day.   CoQ10 100 MG Caps Take 100 mg by mouth daily.   furosemide 20 MG tablet Commonly known as: Lasix Take 1 tablet (20 mg total) by mouth daily as needed. What changed:  how much to take reasons to take this   gabapentin 300 MG capsule Commonly known as: NEURONTIN Take 300 mg by mouth. One every night   metoprolol succinate 50 MG 24 hr tablet Commonly known as: TOPROL-XL Take 1 tablet (50 mg total) by mouth in the morning and at bedtime.   multivitamin with minerals Tabs tablet Take 1 tablet by mouth daily.  pantoprazole 40 MG tablet Commonly known as: PROTONIX TAKE ONE TABLET BY MOUTH ONCE DAILY BEFORE BREAKFAST.   potassium chloride SA 20 MEQ tablet Commonly known as: KLOR-CON M Take 1 tablet (20 mEq total) by mouth daily.   pravastatin 40 MG tablet Commonly known as: PRAVACHOL Take 40 mg by mouth at bedtime.   traMADol 50 MG  tablet Commonly known as: ULTRAM Take 50 mg by mouth every 6 (six) hours as needed for moderate pain (pain score 4-6).               Durable Medical Equipment  (From admission, onward)           Start     Ordered   07/08/23 0825  For home use only DME Shower stool  Once        07/08/23 0824            Follow-up Information     [facility] Follow up.   Specialty: Home Health Services Contact information: [address] [place] [address] [phone]                Allergies  Allergen Reactions   Sulfa Antibiotics Other (See Comments)    Pt states that this med makes her feel crazy.      Consultations: [facility]   Procedures/Studies: DG Chest Port 1 View Result Date: 07/06/2023 CLINICAL DATA:  Shortness of breath, hypoxia EXAM: PORTABLE CHEST 1 VIEW COMPARISON:  07/02/2023 FINDINGS: Large hiatal hernia. Cardiomegaly. Tortuous aorta with calcifications. Bibasilar opacities, favor atelectasis. No effusions. No acute bony abnormality. IMPRESSION: Bibasilar atelectasis. Mild cardiomegaly. Large hiatal hernia. Electronically Signed   By: Charlett Nose M.D.   On: 07/06/2023 23:09   DG Chest 1 View Result Date: 07/02/2023 CLINICAL DATA:  Fall with contusion to the face EXAM: CHEST  1 VIEW COMPARISON:  01/02/2021 FINDINGS: Cardiomegaly, hiatal hernia, and known aortic aneurysm. Band of scarring over the right mid lung. There is no edema, consolidation, effusion, or pneumothorax. No acute osseous finding. IMPRESSION: No acute or interval finding. Electronically Signed   By: Tiburcio Pea M.D.   On: 07/02/2023 06:43   DG Shoulder Right Result Date: 07/02/2023 CLINICAL DATA:  Contusion to the right shoulder EXAM: RIGHT SHOULDER - 3 VIEW COMPARISON:  Chest CT 03/20/2022 FINDINGS: No acute fracture, dislocation, or separation. Degenerative spurring at the [facility] joint which is mainly upward. Small calcification at the rotator cuff. There is of  the a band of opacity over the right hilum which correlates with scarring on 2023 chest CT. IMPRESSION: No acute finding. Electronically Signed   By: Tiburcio Pea M.D.   On: 07/02/2023 06:42   CT Head Wo Contrast Result Date: 07/02/2023 CLINICAL DATA:  Head trauma EXAM: CT HEAD WITHOUT CONTRAST CT MAXILLOFACIAL WITHOUT CONTRAST CT CERVICAL SPINE WITHOUT CONTRAST TECHNIQUE: Multidetector CT imaging of the head, cervical spine, and maxillofacial structures were performed using the standard protocol without intravenous contrast. Multiplanar CT image reconstructions of the cervical spine and maxillofacial structures were also generated. RADIATION DOSE REDUCTION: This exam was performed according to the departmental dose-optimization program which includes automated exposure control, adjustment of the mA and/or kV according to patient size and/or use of iterative reconstruction technique. COMPARISON:  None Available. FINDINGS: CT HEAD FINDINGS Brain: There is no mass, hemorrhage or extra-axial collection. The size and configuration of the ventricles and extra-axial CSF spaces are normal. There is hypoattenuation of the white matter, most commonly indicating chronic small vessel  disease. Vascular: No hyperdense vessel or unexpected vascular calcification. Skull: Frontal scalp hematoma.  No skull fracture. Sinuses/Orbits: No fluid levels or advanced mucosal thickening of the visualized paranasal sinuses. No mastoid or middle ear effusion. Normal orbits. Other: None. CT MAXILLOFACIAL FINDINGS Osseous: Comminuted fracture of the nasal bones. Orbits: The globes are intact. Normal appearance of the intra- and extraconal fat. Symmetric extraocular muscles and optic nerves. Sinuses: No fluid levels or advanced mucosal thickening. Soft tissues: Paranasal soft tissue swelling. CT CERVICAL SPINE FINDINGS Alignment: No static subluxation. Facets are aligned. Occipital condyles and the lateral masses of C1-C2 are aligned. Skull  base and vertebrae: No acute fracture. Soft tissues and spinal canal: No prevertebral fluid or swelling. No visible canal hematoma. Disc levels: No advanced spinal canal or neural foraminal stenosis. Upper chest: No pneumothorax, pulmonary nodule or pleural effusion. Other: Normal visualized paraspinal cervical soft tissues. IMPRESSION: 1. No acute intracranial abnormality. 2. Comminuted fracture of the nasal bones. 3. Frontal scalp hematoma without skull fracture. 4. No acute fracture or static subluxation of the cervical spine. Electronically Signed   By: Deatra Robinson M.D.   On: 07/02/2023 03:16   CT Cervical Spine Wo Contrast Result Date: 07/02/2023 CLINICAL DATA:  Head trauma EXAM: CT HEAD WITHOUT CONTRAST CT MAXILLOFACIAL WITHOUT CONTRAST CT CERVICAL SPINE WITHOUT CONTRAST TECHNIQUE: Multidetector CT imaging of the head, cervical spine, and maxillofacial structures were performed using the standard protocol without intravenous contrast. Multiplanar CT image reconstructions of the cervical spine and maxillofacial structures were also generated. RADIATION DOSE REDUCTION: This exam was performed according to the departmental dose-optimization program which includes automated exposure control, adjustment of the mA and/or kV according to patient size and/or use of iterative reconstruction technique. COMPARISON:  None Available. FINDINGS: CT HEAD FINDINGS Brain: There is no mass, hemorrhage or extra-axial collection. The size and configuration of the ventricles and extra-axial CSF spaces are normal. There is hypoattenuation of the white matter, most commonly indicating chronic small vessel disease. Vascular: No hyperdense vessel or unexpected vascular calcification. Skull: Frontal scalp hematoma.  No skull fracture. Sinuses/Orbits: No fluid levels or advanced mucosal thickening of the visualized paranasal sinuses. No mastoid or middle ear effusion. Normal orbits. Other: None. CT MAXILLOFACIAL FINDINGS Osseous:  Comminuted fracture of the nasal bones. Orbits: The globes are intact. Normal appearance of the intra- and extraconal fat. Symmetric extraocular muscles and optic nerves. Sinuses: No fluid levels or advanced mucosal thickening. Soft tissues: Paranasal soft tissue swelling. CT CERVICAL SPINE FINDINGS Alignment: No static subluxation. Facets are aligned. Occipital condyles and the lateral masses of C1-C2 are aligned. Skull base and vertebrae: No acute fracture. Soft tissues and spinal canal: No prevertebral fluid or swelling. No visible canal hematoma. Disc levels: No advanced spinal canal or neural foraminal stenosis. Upper chest: No pneumothorax, pulmonary nodule or pleural effusion. Other: Normal visualized paraspinal cervical soft tissues. IMPRESSION: 1. No acute intracranial abnormality. 2. Comminuted fracture of the nasal bones. 3. Frontal scalp hematoma without skull fracture. 4. No acute fracture or static subluxation of the cervical spine. Electronically Signed   By: Deatra Robinson M.D.   On: 07/02/2023 03:16   CT Maxillofacial Wo Contrast Result Date: 07/02/2023 CLINICAL DATA:  Head trauma EXAM: CT HEAD WITHOUT CONTRAST CT MAXILLOFACIAL WITHOUT CONTRAST CT CERVICAL SPINE WITHOUT CONTRAST TECHNIQUE: Multidetector CT imaging of the head, cervical spine, and maxillofacial structures were performed using the standard protocol without intravenous contrast. Multiplanar CT image reconstructions of the cervical spine and maxillofacial structures were also generated. RADIATION DOSE  REDUCTION: This exam was performed according to the departmental dose-optimization program which includes automated exposure control, adjustment of the mA and/or kV according to patient size and/or use of iterative reconstruction technique. COMPARISON:  None Available. FINDINGS: CT HEAD FINDINGS Brain: There is no mass, hemorrhage or extra-axial collection. The size and configuration of the ventricles and extra-axial CSF spaces are  normal. There is hypoattenuation of the white matter, most commonly indicating chronic small vessel disease. Vascular: No hyperdense vessel or unexpected vascular calcification. Skull: Frontal scalp hematoma.  No skull fracture. Sinuses/Orbits: No fluid levels or advanced mucosal thickening of the visualized paranasal sinuses. No mastoid or middle ear effusion. Normal orbits. Other: None. CT MAXILLOFACIAL FINDINGS Osseous: Comminuted fracture of the nasal bones. Orbits: The globes are intact. Normal appearance of the intra- and extraconal fat. Symmetric extraocular muscles and optic nerves. Sinuses: No fluid levels or advanced mucosal thickening. Soft tissues: Paranasal soft tissue swelling. CT CERVICAL SPINE FINDINGS Alignment: No static subluxation. Facets are aligned. Occipital condyles and the lateral masses of C1-C2 are aligned. Skull base and vertebrae: No acute fracture. Soft tissues and spinal canal: No prevertebral fluid or swelling. No visible canal hematoma. Disc levels: No advanced spinal canal or neural foraminal stenosis. Upper chest: No pneumothorax, pulmonary nodule or pleural effusion. Other: Normal visualized paraspinal cervical soft tissues. IMPRESSION: 1. No acute intracranial abnormality. 2. Comminuted fracture of the nasal bones. 3. Frontal scalp hematoma without skull fracture. 4. No acute fracture or static subluxation of the cervical spine. Electronically Signed   By: Deatra Robinson M.D.   On: 07/02/2023 03:16   (Echo, Carotid, EGD, Colonoscopy, ERCP)    Subjective: No complaints  Discharge Exam: Vitals:   07/09/23 0438 07/09/23 0845  BP: 110/62 (!) 122/39  Pulse: 64 72  Resp:  17  Temp: 97.6 F (36.4 C) (!) 97.5 F (36.4 C)  SpO2: 93% 100%   Vitals:   07/08/23 1656 07/08/23 2026 07/09/23 0438 07/09/23 0845  BP: (!) 95/56 137/64 110/62 (!) 122/39  Pulse: 73 78 64 72  Resp: 17   17  Temp: 98.5 F (36.9 C) 98.9 F (37.2 C) 97.6 F (36.4 C) (!) 97.5 F (36.4 C)   TempSrc: Oral Oral  Oral  SpO2: 100% 95% 93% 100%  Weight:      Height:        General: Pt is alert, awake, not in acute distress Cardiovascular: RRR, S1/S2 +, no rubs, no gallops Respiratory: CTA bilaterally, no wheezing, no rhonchi Abdominal: Soft, NT, ND, bowel sounds + Extremities: no edema, no cyanosis    The results of significant diagnostics from this hospitalization (including imaging, microbiology, ancillary and laboratory) are listed below for reference.     Microbiology: No results found for this or any previous visit (from the past 240 hours).   Labs: BNP (last 3 results) No results for input(s): "BNP" in the last 8760 hours. Basic Metabolic Panel: Recent Labs  Lab 07/06/23 2150 07/07/23 0319  NA 136 138  K 4.7 4.5  CL 100 103  CO2 27 25  GLUCOSE 177* 137*  BUN 19 20  CREATININE 1.03* 1.06*  CALCIUM 9.0 8.7*   Liver Function Tests: Recent Labs  Lab 07/06/23 2150  AST 21  ALT 17  ALKPHOS 48  BILITOT 0.6  PROT 7.0  ALBUMIN 3.9   No results for input(s): "LIPASE", "AMYLASE" in the last 168 hours. No results for input(s): "AMMONIA" in the last 168 hours. CBC: Recent Labs  Lab 07/06/23 2150 07/07/23  0319 07/08/23 1226 07/09/23 0618  WBC 6.8 6.7 7.9 6.5  NEUTROABS 5.0  --   --   --   HGB 12.4 10.9* 11.3* 9.9*  HCT 39.7 33.8* 35.1* 30.9*  MCV 98.8 96.6 97.0 96.6  PLT 184 158 178 159   Cardiac Enzymes: No results for input(s): "CKTOTAL", "CKMB", "CKMBINDEX", "TROPONINI" in the last 168 hours. BNP: Invalid input(s): "POCBNP" CBG: No results for input(s): "GLUCAP" in the last 168 hours. D-Dimer No results for input(s): "DDIMER" in the last 72 hours. Hgb A1c No results for input(s): "HGBA1C" in the last 72 hours. Lipid Profile No results for input(s): "CHOL", "HDL", "LDLCALC", "TRIG", "CHOLHDL", "LDLDIRECT" in the last 72 hours. Thyroid function studies No results for input(s): "TSH", "T4TOTAL", "T3FREE", "THYROIDAB" in the last 72  hours.  Invalid input(s): "FREET3" Anemia work up No results for input(s): "VITAMINB12", "FOLATE", "FERRITIN", "TIBC", "IRON", "RETICCTPCT" in the last 72 hours. Urinalysis    Component Value Date/Time   COLORURINE YELLOW 01/01/2021 2300   APPEARANCEUR Hazy (A) 02/27/2022 1429   LABSPEC 1.025 01/01/2021 2300   PHURINE 6.0 01/01/2021 2300   GLUCOSEU Negative 02/27/2022 1429   HGBUR LARGE (A) 01/01/2021 2300   BILIRUBINUR Negative 02/27/2022 1429   KETONESUR >80 (A) 01/01/2021 2300   PROTEINUR Negative 02/27/2022 1429   PROTEINUR 30 (A) 01/01/2021 2300   UROBILINOGEN negative (A) 05/19/2019 0844   NITRITE Negative 02/27/2022 1429   NITRITE POSITIVE (A) 01/01/2021 2300   LEUKOCYTESUR 1+ (A) 02/27/2022 1429   LEUKOCYTESUR SMALL (A) 01/01/2021 2300   Sepsis Labs Recent Labs  Lab 07/06/23 2150 07/07/23 0319 07/08/23 1226 07/09/23 0618  WBC 6.8 6.7 7.9 6.5   Microbiology No results found for this or any previous visit (from the past 240 hours).   Time coordinating discharge: Over 35 minutes  SIGNED:   Marinda Elk, MD  [facility] 07/09/2023, 11:46 AM Pager   If 7PM-7AM, please contact night-coverage [email or web address] Password [email or web address]

## 2023-07-10 ENCOUNTER — Telehealth (INDEPENDENT_AMBULATORY_CARE_PROVIDER_SITE_OTHER): Payer: Self-pay | Admitting: Otolaryngology

## 2023-07-10 NOTE — Telephone Encounter (Signed)
 LVM to confirm appt & location [phone] afm

## 2023-07-11 ENCOUNTER — Ambulatory Visit (INDEPENDENT_AMBULATORY_CARE_PROVIDER_SITE_OTHER): Admitting: Otolaryngology

## 2023-07-11 VITALS — BP 137/81 | HR 79 | Ht 61.0 in | Wt 126.0 lb

## 2023-07-11 DIAGNOSIS — J342 Deviated nasal septum: Secondary | ICD-10-CM

## 2023-07-11 DIAGNOSIS — S022XXA Fracture of nasal bones, initial encounter for closed fracture: Secondary | ICD-10-CM

## 2023-07-11 DIAGNOSIS — R040 Epistaxis: Secondary | ICD-10-CM | POA: Diagnosis not present

## 2023-07-11 DIAGNOSIS — J343 Hypertrophy of nasal turbinates: Secondary | ICD-10-CM | POA: Diagnosis not present

## 2023-07-14 DIAGNOSIS — S022XXA Fracture of nasal bones, initial encounter for closed fracture: Secondary | ICD-10-CM | POA: Insufficient documentation

## 2023-07-14 NOTE — Progress Notes (Signed)
 Patient ID: [name], female   DOB: 12-28-1928, 89 y.o.   MRN: [id number]  CC: Nasal fractures, persistent left epistaxis  HPI:  [name] is a 89 y.o. female who presents today for evaluation of her nasal fractures and left epistaxis.  According to the patient, she had an accidental fall on July 02, 2023.  It resulted in significant nasal trauma, with comminuted fractures of her nasal bone.  She also had significant left-sided epistaxis, requiring left nasal packing.  It should be noted that the patient is anticoagulated with Eliquis due to her atrial fibrillation.  The patient has no previous nasal surgery.   Past Medical History:  Diagnosis Date   Anemia    Ascending aortic aneurysm (HCC)    a. 4.6cm by CT 07/2016.   Chronic diastolic CHF (congestive heart failure) (HCC)    Coronary artery calcification seen on CT scan 07/2016   History of blood transfusion    History of kidney stones    Hypertension    Hypomagnesemia    PAF (paroxysmal atrial fibrillation) (HCC)    a. dx during adm 08/2016.   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   Negative Jak2, BCR/ABL, normal epo level on 02/01/2014   Sepsis (HCC) 08/2016   Thrombocytopenia (HCC)    Trigeminal neuralgia     Past Surgical History:  Procedure Laterality Date   BREAST CYST EXCISION  60 yrs ago   COLONOSCOPY WITH ESOPHAGOGASTRODUODENOSCOPY (EGD) N/A 03/10/2013   Rehman: normal except few diverticula in simoid   CYSTOSCOPY W/ URETERAL STENT PLACEMENT Right 01/02/2021   Procedure: CYSTOSCOPY WITH RETROGRADE PYELOGRAM/URETERAL STENT PLACEMENT;  Surgeon: Malen Gauze, MD;  Location: [facility];  Service: Urology;  Laterality: Right;   CYSTOSCOPY WITH RETROGRADE PYELOGRAM, URETEROSCOPY AND STENT PLACEMENT Right 01/19/2021   Procedure: CYSTOSCOPY WITH RETROGRADE PYELOGRAM, URETEROSCOPY AND STENT EXCHANGE;  Surgeon: Malen Gauze, MD;  Location: [facility];  Service: Urology;  Laterality: Right;   ESOPHAGEAL DILATION N/A  03/08/2021   Procedure: ESOPHAGEAL DILATION;  Surgeon: Malissa Hippo, MD;  [facility];  Service: Endoscopy;  Laterality: N/A;   ESOPHAGOGASTRODUODENOSCOPY  2014   Erosive reflux esophagitis with stricture at GE junction which was dilated with a balloon dilator to 18 mm. Moderate size sliding hiatal hernia. small ulcer at gastric body along with antral erosions   ESOPHAGOGASTRODUODENOSCOPY (EGD) WITH PROPOFOL N/A 03/08/2021   Procedure: ESOPHAGOGASTRODUODENOSCOPY (EGD) WITH PROPOFOL;  Surgeon: Malissa Hippo, MD;  [facility];  Service: Endoscopy;  Laterality: N/A;  9:20   EXTRACORPOREAL SHOCK WAVE LITHOTRIPSY Right 09/20/2016   Procedure: RIGHT EXTRACORPOREAL SHOCK WAVE LITHOTRIPSY (ESWL);  Surgeon: Alfredo Martinez, MD;  Location: [facility];  Service: Urology;  Laterality: Right;   EXTRACORPOREAL SHOCK WAVE LITHOTRIPSY Right 02/16/2019   Procedure: EXTRACORPOREAL SHOCK WAVE LITHOTRIPSY (ESWL);  Surgeon: Marcine Matar, MD;  Location: [facility];  Service: Urology;  Laterality: Right;   EXTRACORPOREAL SHOCK WAVE LITHOTRIPSY Right 04/27/2019   Procedure: EXTRACORPOREAL SHOCK WAVE LITHOTRIPSY (ESWL);  Surgeon: Marcine Matar, MD;  Location: [facility];  Service: Urology;  Laterality: Right;   EYE SURGERY     cataract surgery bilat    Gamma Knife     Trigeminal    HOLMIUM LASER APPLICATION Right 01/19/2021   Procedure: HOLMIUM LASER APPLICATION;  Surgeon: Malen Gauze, MD;  Location: [facility];  Service: Urology;  Laterality: Right;   SLT LASER APPLICATION Left 09/27/2014   Procedure: SLT LASER APPLICATION;  Surgeon: Susa Simmonds, MD;  Location:  [facility];  Service: Ophthalmology;  Laterality: Left;    Family History  Problem Relation Age of Onset   Congestive Heart Failure Mother    Heart attack Father    CAD Neg Hx     Social History:  reports that she has never smoked. She has never been exposed to tobacco smoke. She has never used smokeless tobacco. She  reports that she does not drink alcohol and does not use drugs.  Allergies:  Allergies  Allergen Reactions   Sulfa Antibiotics Other (See Comments)    Pt states that this med makes her feel crazy.      Prior to Admission medications   Medication Sig Start Date End Date Taking? Authorizing Provider  amiodarone (PACERONE) 200 MG tablet TAKE (1) TABLET BY MOUTH DAILY. 10/31/21  Yes Strader, [place] M, PA-C  amoxicillin-clavulanate (AUGMENTIN) 500-125 MG tablet Take 1 tablet by mouth every 12 (twelve) hours for 5 days. 07/09/23 07/14/23 Yes Marinda Elk, MD  Calcium Carbonate-Vitamin D 600-400 MG-UNIT tablet Take 1 tablet by mouth daily.   Yes [provider]  Cinnamon 500 MG capsule Take 1,000 mg by mouth every other day.   Yes [provider]  Coenzyme Q10 (COQ10) 100 MG CAPS Take 100 mg by mouth daily.   Yes [provider]  furosemide (LASIX) 20 MG tablet Take 1 tablet (20 mg total) by mouth daily as needed. Patient taking differently: Take 40 mg by mouth daily as needed for fluid. 10/20/19  Yes Strader, [place] M, PA-C  gabapentin (NEURONTIN) 300 MG capsule Take 300 mg by mouth. One every night   Yes [provider]  metoprolol succinate (TOPROL-XL) 50 MG 24 hr tablet Take 1 tablet (50 mg total) by mouth in the morning and at bedtime. 03/20/22  Yes Loreli Slot, MD  Multiple Vitamin (MULTIVITAMIN WITH MINERALS) TABS tablet Take 1 tablet by mouth daily.   Yes [provider]  pantoprazole (PROTONIX) 40 MG tablet TAKE ONE TABLET BY MOUTH ONCE DAILY BEFORE BREAKFAST. 03/26/23  Yes Carlan, Chelsea L, NP  potassium chloride SA (KLOR-CON) 20 MEQ tablet Take 1 tablet (20 mEq total) by mouth daily. 01/08/21  Yes Vassie Loll, MD  pravastatin (PRAVACHOL) 40 MG tablet Take 40 mg by mouth at bedtime.    Yes [provider]  traMADol (ULTRAM) 50 MG tablet Take 50 mg by mouth every 6 (six) hours as needed for moderate pain (pain score  4-6).   Yes [provider]    Blood pressure 137/81, pulse 79, height 5\' 1"  (1.549 m), weight 126 lb (57.2 kg), SpO2 92%. Exam: General: Communicates without difficulty, well nourished, no acute distress. Head: Significant bilateral facial ecchymosis and edema.  Facial movement is normal and symmetric. Eyes: PERRL, EOMI. No scleral icterus, conjunctivae clear. Neuro: CN II exam reveals vision grossly intact.  No nystagmus at any point of gaze. Ears: Auricles well formed without lesions.  Ear canals are intact without mass or lesion.  No erythema or edema is appreciated.  The TMs are intact without fluid. Nose: External evaluation reveals ecchymotic and edematous nasal dorsum.  No significant deformity is noted.  Left nasal packing is in place.  Oral:  Oral cavity and oropharynx are intact, symmetric, without erythema or edema.  Mucosa is moist without lesions. Neck: Full range of motion without pain.  There is no significant lymphadenopathy.  No masses palpable.  Thyroid bed within normal limits to palpation.  Parotid glands and submandibular glands equal bilaterally without  mass.  Trachea is midline. Neuro:  CN 2-12 grossly intact.   Procedure:  Endoscopic control of recurrent left epistaxis. Indication:  Recurrent epistaxis  Description: The left nasal packing is removed.  Bleeding is noted from the left nasal cavity.  The left nasal cavity is sprayed with topical xylocaine and neo-synephrine.  After adequate anesthesia is achieved, the nasal cavity is examined with a 0 rigid endoscope.  A suction catheter is inserted into parallel with the 0 endoscope, and it is used to suction blood clots from the nasal cavity.  Several hypervascular areas are noted on the left nasal septum and left inferior turbinate.  Active bleeding is noted. A silver nitrate stick is inserted in parallel with the 0 endoscope.  It is used to repeatedly cauterized the hypervascular areas.  Good hemostasis is achieved.   The patient tolerated the procedure well.    Assessment: 1.  Bilateral comminuted nasal fractures.  No significant external deformity is noted today. 2.  Recurrent left epistaxis.  Active bleeding is noted from the left nasal septum and left inferior turbinate. 3.  No suspicious mass or lesion is noted.  Plan: 1.  The physical exam and nasal endoscopy findings are reviewed with the patient. 2.  Endoscopic cauterization of the left nasal septum and left inferior turbinate. 3.  The patient is instructed to hold her Eliquis for 1 week. 4.  Conservative observation regarding her comminuted nasal fractures.  She has no significant knee so deformity. 5.  Humidifier and nasal ointment during the winter months. 6.  The patient will return for reevaluation in 6 weeks.  Robbyn Hodkinson W Caellum Mancil 07/14/2023, 7:34 AM

## 2023-07-17 DIAGNOSIS — G500 Trigeminal neuralgia: Secondary | ICD-10-CM | POA: Diagnosis not present

## 2023-07-17 DIAGNOSIS — Z960 Presence of urogenital implants: Secondary | ICD-10-CM | POA: Diagnosis not present

## 2023-07-17 DIAGNOSIS — I480 Paroxysmal atrial fibrillation: Secondary | ICD-10-CM | POA: Diagnosis not present

## 2023-07-17 DIAGNOSIS — S022XXD Fracture of nasal bones, subsequent encounter for fracture with routine healing: Secondary | ICD-10-CM | POA: Diagnosis not present

## 2023-07-17 DIAGNOSIS — D649 Anemia, unspecified: Secondary | ICD-10-CM | POA: Diagnosis not present

## 2023-07-17 DIAGNOSIS — I5032 Chronic diastolic (congestive) heart failure: Secondary | ICD-10-CM | POA: Diagnosis not present

## 2023-07-17 DIAGNOSIS — I7121 Aneurysm of the ascending aorta, without rupture: Secondary | ICD-10-CM | POA: Diagnosis not present

## 2023-07-17 DIAGNOSIS — K449 Diaphragmatic hernia without obstruction or gangrene: Secondary | ICD-10-CM | POA: Diagnosis not present

## 2023-07-17 DIAGNOSIS — Z87442 Personal history of urinary calculi: Secondary | ICD-10-CM | POA: Diagnosis not present

## 2023-07-17 DIAGNOSIS — K2100 Gastro-esophageal reflux disease with esophagitis, without bleeding: Secondary | ICD-10-CM | POA: Diagnosis not present

## 2023-07-17 DIAGNOSIS — E785 Hyperlipidemia, unspecified: Secondary | ICD-10-CM | POA: Diagnosis not present

## 2023-07-17 DIAGNOSIS — I110 Hypertensive heart disease with heart failure: Secondary | ICD-10-CM | POA: Diagnosis not present

## 2023-07-17 DIAGNOSIS — J9811 Atelectasis: Secondary | ICD-10-CM | POA: Diagnosis not present

## 2023-07-17 DIAGNOSIS — I2510 Atherosclerotic heart disease of native coronary artery without angina pectoris: Secondary | ICD-10-CM | POA: Diagnosis not present

## 2023-07-17 DIAGNOSIS — D696 Thrombocytopenia, unspecified: Secondary | ICD-10-CM | POA: Diagnosis not present

## 2023-07-17 DIAGNOSIS — D751 Secondary polycythemia: Secondary | ICD-10-CM | POA: Diagnosis not present

## 2023-07-17 DIAGNOSIS — Z9181 History of falling: Secondary | ICD-10-CM | POA: Diagnosis not present

## 2023-07-24 DIAGNOSIS — E782 Mixed hyperlipidemia: Secondary | ICD-10-CM | POA: Diagnosis not present

## 2023-07-24 DIAGNOSIS — Z79899 Other long term (current) drug therapy: Secondary | ICD-10-CM | POA: Diagnosis not present

## 2023-07-24 DIAGNOSIS — R7303 Prediabetes: Secondary | ICD-10-CM | POA: Diagnosis not present

## 2023-07-24 DIAGNOSIS — R54 Age-related physical debility: Secondary | ICD-10-CM | POA: Diagnosis not present

## 2023-07-24 DIAGNOSIS — I509 Heart failure, unspecified: Secondary | ICD-10-CM | POA: Diagnosis not present

## 2023-07-24 DIAGNOSIS — I10 Essential (primary) hypertension: Secondary | ICD-10-CM | POA: Diagnosis not present

## 2023-07-24 DIAGNOSIS — I110 Hypertensive heart disease with heart failure: Secondary | ICD-10-CM | POA: Diagnosis not present

## 2023-07-24 DIAGNOSIS — K219 Gastro-esophageal reflux disease without esophagitis: Secondary | ICD-10-CM | POA: Diagnosis not present

## 2023-07-24 DIAGNOSIS — Z9181 History of falling: Secondary | ICD-10-CM | POA: Diagnosis not present

## 2023-07-24 DIAGNOSIS — Z789 Other specified health status: Secondary | ICD-10-CM | POA: Diagnosis not present

## 2023-07-24 DIAGNOSIS — E611 Iron deficiency: Secondary | ICD-10-CM | POA: Diagnosis not present

## 2023-08-08 NOTE — Progress Notes (Signed)
 Cardiology Office Note:  .   Date:  08/09/2023  ID:  [name], DOB 1929/03/17, MRN [id number] PCP: [facility]  [facility] Providers Cardiologist:  Teddie Favre, MD {   History of Present Illness: [name]   [name] is a 89 y.o. female with pmhx of paroxysmal atrial fibrillation (on Eliquis ), coronary calcifications by prior CT in 02/2020, HFpEF (12/2020: EF 60-65%), thoracic aortic aneurysm (CTA 02/2022: 56 mm, not a candidate and/or interested in CT surgery), HTN, nephrolithiasis, polycythemia, and thrombocytopenia who presents to [facility] for hospital f/u and overdue 18 m/o f/u.   Last seen in heartcare in 01/2021 with Woodfin Hays, PA-C for hospital f/u for afib with RVR. She was doing well and no medications changed. She was recently hospitalized 3/15-18/2025 for epistaxis related to mechanical fall and diagnosed as nasal fracture. Developed burgundy stools, which resolved and most likely related to epistaxis. Hgb was monitored and stabilized at 9-10. Treated with nasal packing. ENT was consulted who recommended to start abx and f/u in 2-3 days. Eliquis  was held until f/u with ENT. Continued on Amiodarone  200 mg, Toprol  50 mg BID, Lasix  20 mg prn, Klor-con  20 mEq, Pravastatin  40 mg, CoQ10 100 mg. She f/u with ENT on 07/11/2023 who treated left nasal septum and left inferior turbinate with endoscopic cauterization. ENT recommended to continue holding Eliquis  5 mg BID x 1 week and f/u on 08/27/2023.   On interview, patient denied any CP, SOB, orthopnea, PND, edema, dizziness, palpitations. Also, denied any active bleeding s/s. She restarted Eliquis  5 mg approximately 07/18/23. She is able to move around apartment using cane to complete daily house task. Sometimes, she gets assistance from caregiver. She is not interested in any procedures but would like to continue monitor surveillance.  Endorses a healthy diet.    Studies Reviewed: [name]       ECHO 12/2020  1. Left  ventricular ejection fraction, by estimation, is 60 to 65%. The  left ventricle has normal function. The left ventricle has no regional  wall motion abnormalities. Left ventricular diastolic parameters were  normal.   2. Right ventricular systolic function is normal. The right ventricular  size is mildly enlarged. There is severely elevated pulmonary artery  systolic pressure. The estimated right ventricular systolic pressure is  63.4 mmHg.   3. Left atrial size was mildly dilated.   4. Right atrial size was mildly dilated.   5. The mitral valve is degenerative. Mild to moderate mitral valve  regurgitation with very eccentric jet.   6. Tricuspid valve regurgitation is moderate to severe.   7. The aortic valve is tricuspid. There is mild calcification of the  aortic valve. Aortic valve regurgitation is mild. Mild to moderate aortic  valve sclerosis/calcification is present, without any evidence of aortic  stenosis. Aortic valve mean gradient  measures 7.0 mmHg.   8. Aortic dilatation noted. There is moderate dilatation of the ascending  aorta, measuring 42 mm.   9. The inferior vena cava is dilated in size with <50% respiratory  variability, suggesting right atrial pressure of 15 mmHg.   Physical Exam:   VS:  BP 120/70   Pulse 70   Ht 5\' 2"  (1.575 m)   Wt 128 lb 3.2 oz (58.2 kg)   SpO2 97%   BMI 23.45 kg/m    Wt Readings from Last 3 Encounters:  08/09/23 128 lb 3.2 oz (58.2 kg)  07/11/23 126 lb (57.2 kg)  07/06/23 141 lb (64 kg)  GEN: Well nourished, well developed in no acute distress NECK: No JVD; No carotid bruits CARDIAC: RRR, 2/6 systolic murmur  RESPIRATORY:  Clear to auscultation without rales, wheezing or rhonchi  ABDOMEN: Soft, non-tender, non-distended EXTREMITIES:  No edema; No deformity   ASSESSMENT AND PLAN: .   Paroxysmal Afib  - denies any palpitations.  - Restarted Eliquis  5 mg BID on 07/18/23. Decrease Eliquis  to 2.5 mg BID, which is the appropriate dose  given her age, weight and renal function. (Age > 80, Body wt < 60 kg). Denied any active bleeding.  - 07/24/2023: K 4.5, Cr 0.95, AST 26, ALT 14. Will get labs checked with PCP. Requesting most recent labs for TSH from PCP.  - Continue on Amio 200 mg daily and Toprol  50 mg BID.    HFpEF  - denies any SOB or edema. - ECHO 12/2020: EF 60-65%, mildly enlarged RV, severely elevated PASP, L/R mildly dilated - 07/24/2023: K, Cr, AST/ALT WNL as above  - Continue Toprol  50 mg BID, and Lasix  20 mg prn.   HTN - BP in office: 120/70 - Continue on Toprol  50 mg BID  Coronary Calcification by CT in 02/2020 [facility]  - 07/24/2023: LDL 30, AST/ALT WNL as above  - Continue Pravastatin  40 mg daily and Toprol  50 mg BID.  - No ASA with AC use.   Thoracic Aortic Aneurysm  - ECHO 12/2020: moderate ascending aorta measuring 42 mm  - CTA 02/2022: 56 mm  - per Dr. Luna Salinas note, previously discussed surgery but "she is not interested in any intervention or a possible candidate", however would like to continue with follow up via ECHO instead of CT. Ordering ECHO.   Valvular Heart Disease  - ECHO 12/2020: mild to moderate MVR with very eccentric jet, moderate to severe TVR, mild AVR, mild to moderate AV sclerosis w/o AS   - She is not interested any procedures but would like to continue monitor surveillance.  ECHO pending.   Dispo: 1 year follow up with Dr. Londa Rival.   Signed, Metta Actis, PA-C

## 2023-08-09 ENCOUNTER — Ambulatory Visit: Payer: PPO | Attending: Student | Admitting: Physician Assistant

## 2023-08-09 ENCOUNTER — Encounter: Payer: Self-pay | Admitting: Physician Assistant

## 2023-08-09 VITALS — BP 120/70 | HR 70 | Ht 62.0 in | Wt 128.2 lb

## 2023-08-09 DIAGNOSIS — E782 Mixed hyperlipidemia: Secondary | ICD-10-CM

## 2023-08-09 DIAGNOSIS — I7121 Aneurysm of the ascending aorta, without rupture: Secondary | ICD-10-CM | POA: Diagnosis not present

## 2023-08-09 DIAGNOSIS — I5032 Chronic diastolic (congestive) heart failure: Secondary | ICD-10-CM | POA: Diagnosis not present

## 2023-08-09 DIAGNOSIS — I2510 Atherosclerotic heart disease of native coronary artery without angina pectoris: Secondary | ICD-10-CM

## 2023-08-09 DIAGNOSIS — I10 Essential (primary) hypertension: Secondary | ICD-10-CM | POA: Diagnosis not present

## 2023-08-09 DIAGNOSIS — I38 Endocarditis, valve unspecified: Secondary | ICD-10-CM

## 2023-08-09 DIAGNOSIS — I509 Heart failure, unspecified: Secondary | ICD-10-CM

## 2023-08-09 DIAGNOSIS — I480 Paroxysmal atrial fibrillation: Secondary | ICD-10-CM

## 2023-08-09 MED ORDER — APIXABAN 2.5 MG PO TABS: 2.5000 mg | ORAL_TABLET | Freq: Two times a day (BID) | ORAL | 3 refills | Status: DC

## 2023-08-09 NOTE — Patient Instructions (Signed)
 Medication Instructions:  Your physician has recommended you make the following change in your medication:   Decrease Eliquis  to 2.5 mg Two Times Daily    *If you need a refill on your cardiac medications before your next appointment, please call your pharmacy*  Lab Work: NONE   If you have labs (blood work) drawn today and your tests are completely normal, you will receive your results only by: MyChart Message (if you have MyChart) OR A paper copy in the mail If you have any lab test that is abnormal or we need to change your treatment, we will call you to review the results.  Testing/Procedures: NONE   Follow-Up: At [facility], you and your health needs are our priority.  As part of our continuing mission to provide you with exceptional heart care, our providers are all part of one team.  This team includes your primary Cardiologist (physician) and Advanced Practice Providers or APPs (Physician Assistants and Nurse Practitioners) who all work together to provide you with the care you need, when you need it.  Your next appointment:   1 year(s)  Provider:   You may see Teddie Favre, MD or one of the following Advanced Practice Providers on your designated Care Team:   Woodfin Hays, PA-C  [place], [place] Theotis Flake, [place]     We recommend signing up for the patient portal called "MyChart".  Sign up information is provided on this After Visit Summary.  MyChart is used to connect with patients for Virtual Visits (Telemedicine).  Patients are able to view lab/test results, encounter notes, upcoming appointments, etc.  Non-urgent messages can be sent to your provider as well.   To learn more about what you can do with MyChart, go to [email or web address].   Other Instructions Thank you for choosing  [facility]!

## 2023-08-19 DIAGNOSIS — G8929 Other chronic pain: Secondary | ICD-10-CM | POA: Diagnosis not present

## 2023-08-19 DIAGNOSIS — I4891 Unspecified atrial fibrillation: Secondary | ICD-10-CM | POA: Diagnosis not present

## 2023-08-19 DIAGNOSIS — R54 Age-related physical debility: Secondary | ICD-10-CM | POA: Diagnosis not present

## 2023-08-19 DIAGNOSIS — N189 Chronic kidney disease, unspecified: Secondary | ICD-10-CM | POA: Diagnosis not present

## 2023-08-19 DIAGNOSIS — I130 Hypertensive heart and chronic kidney disease with heart failure and stage 1 through stage 4 chronic kidney disease, or unspecified chronic kidney disease: Secondary | ICD-10-CM | POA: Diagnosis not present

## 2023-08-19 DIAGNOSIS — E782 Mixed hyperlipidemia: Secondary | ICD-10-CM | POA: Diagnosis not present

## 2023-08-19 DIAGNOSIS — I509 Heart failure, unspecified: Secondary | ICD-10-CM | POA: Diagnosis not present

## 2023-08-21 DIAGNOSIS — R54 Age-related physical debility: Secondary | ICD-10-CM | POA: Diagnosis not present

## 2023-08-21 DIAGNOSIS — E785 Hyperlipidemia, unspecified: Secondary | ICD-10-CM | POA: Diagnosis not present

## 2023-08-21 DIAGNOSIS — I4891 Unspecified atrial fibrillation: Secondary | ICD-10-CM | POA: Diagnosis not present

## 2023-08-21 DIAGNOSIS — Z79899 Other long term (current) drug therapy: Secondary | ICD-10-CM | POA: Diagnosis not present

## 2023-08-21 DIAGNOSIS — R197 Diarrhea, unspecified: Secondary | ICD-10-CM | POA: Diagnosis not present

## 2023-08-21 DIAGNOSIS — Z9181 History of falling: Secondary | ICD-10-CM | POA: Diagnosis not present

## 2023-08-26 DIAGNOSIS — M79675 Pain in left toe(s): Secondary | ICD-10-CM | POA: Diagnosis not present

## 2023-08-26 DIAGNOSIS — L851 Acquired keratosis [keratoderma] palmaris et plantaris: Secondary | ICD-10-CM | POA: Diagnosis not present

## 2023-08-26 DIAGNOSIS — I739 Peripheral vascular disease, unspecified: Secondary | ICD-10-CM | POA: Diagnosis not present

## 2023-08-26 DIAGNOSIS — B351 Tinea unguium: Secondary | ICD-10-CM | POA: Diagnosis not present

## 2023-08-27 ENCOUNTER — Ambulatory Visit (INDEPENDENT_AMBULATORY_CARE_PROVIDER_SITE_OTHER)

## 2023-08-27 ENCOUNTER — Encounter (INDEPENDENT_AMBULATORY_CARE_PROVIDER_SITE_OTHER): Payer: Self-pay

## 2023-08-27 VITALS — Ht 63.0 in | Wt 126.0 lb

## 2023-08-27 DIAGNOSIS — S022XXD Fracture of nasal bones, subsequent encounter for fracture with routine healing: Secondary | ICD-10-CM | POA: Diagnosis not present

## 2023-08-27 DIAGNOSIS — R040 Epistaxis: Secondary | ICD-10-CM

## 2023-08-28 ENCOUNTER — Ambulatory Visit (HOSPITAL_COMMUNITY)
Admission: RE | Admit: 2023-08-28 | Discharge: 2023-08-28 | Disposition: A | Discharge status: Home / Self Care | Source: Ambulatory Visit | Attending: Student | Admitting: Student

## 2023-08-28 DIAGNOSIS — I7121 Aneurysm of the ascending aorta, without rupture: Secondary | ICD-10-CM | POA: Insufficient documentation

## 2023-08-28 DIAGNOSIS — I509 Heart failure, unspecified: Secondary | ICD-10-CM | POA: Insufficient documentation

## 2023-08-28 DIAGNOSIS — I38 Endocarditis, valve unspecified: Secondary | ICD-10-CM | POA: Diagnosis not present

## 2023-08-28 LAB — ECHOCARDIOGRAM COMPLETE
AR max vel: 2.22 cm2
AV Peak grad: 9.2 mmHg
Ao pk vel: 1.52 m/s
Area-P 1/2: 5.02 cm2
MV M vel: 4.82 m/s
MV Peak grad: 92.9 mmHg
MV VTI: 2.53 cm2
P 1/2 time: 331 ms
S' Lateral: 3.5 cm

## 2023-08-28 NOTE — Progress Notes (Signed)
 Patient ID: [name], female   DOB: [birth year], 89 y.o.   MRN: [id number]  Follow-up: Recurrent left epistaxis, nasal fractures  HPI: The patient is a 89 year old female who returns today for follow-up evaluation.  She was last seen in March 2025.  At that time, she was noted to have comminuted fractures of her nasal bone after an accidental fall.  She was also having recurrent left epistaxis.  She was treated with endoscopic cauterization of the left nasal cavity.  The patient returns today reporting no more nasal bleeding.  Her nasal swelling has resolved.  She has not noted any nasal or facial deformity.  Exam: General: Communicates without difficulty, well nourished, no acute distress. Head: Normocephalic, no evidence injury, no tenderness, facial buttresses intact without stepoff. Face/sinus: No tenderness to palpation and percussion. Facial movement is normal and symmetric. Eyes: PERRL, EOMI. No scleral icterus, conjunctivae clear. Neuro: CN II exam reveals vision grossly intact.  No nystagmus at any point of gaze. Ears: Auricles well formed without lesions.  Ear canals are intact without mass or lesion.  No erythema or edema is appreciated.  The TMs are intact without fluid. Nose: External evaluation reveals normal support and skin without lesions.  Dorsum is intact.  Anterior rhinoscopy reveals congested mucosa over anterior aspect of inferior turbinates and intact septum.  No purulence noted. Oral:  Oral cavity and oropharynx are intact, symmetric, without erythema or edema.  Mucosa is moist without lesions. Neck: Full range of motion without pain.  There is no significant lymphadenopathy.  No masses palpable.  Thyroid bed within normal limits to palpation.  Parotid glands and submandibular glands equal bilaterally without mass.  Trachea is midline. Neuro:  CN 2-12 grossly intact.   Assessment: 1.  The patient's recurrent left epistaxis has resolved. 2.  Her nasal fractures have mostly  healed.  No significant deformity is noted.  Plan: 1.  The physical exam findings are reviewed with the patient. 2.  No other ENT intervention is needed at this time. 3.  Humidifier and nasal ointment as needed. 4.  The patient is encouraged to call with any questions or concerns.

## 2023-09-19 DIAGNOSIS — I4891 Unspecified atrial fibrillation: Secondary | ICD-10-CM | POA: Diagnosis not present

## 2023-09-19 DIAGNOSIS — I130 Hypertensive heart and chronic kidney disease with heart failure and stage 1 through stage 4 chronic kidney disease, or unspecified chronic kidney disease: Secondary | ICD-10-CM | POA: Diagnosis not present

## 2023-09-19 DIAGNOSIS — N189 Chronic kidney disease, unspecified: Secondary | ICD-10-CM | POA: Diagnosis not present

## 2023-09-19 DIAGNOSIS — R54 Age-related physical debility: Secondary | ICD-10-CM | POA: Diagnosis not present

## 2023-09-19 DIAGNOSIS — I509 Heart failure, unspecified: Secondary | ICD-10-CM | POA: Diagnosis not present

## 2023-09-19 DIAGNOSIS — G8929 Other chronic pain: Secondary | ICD-10-CM | POA: Diagnosis not present

## 2023-09-19 DIAGNOSIS — E782 Mixed hyperlipidemia: Secondary | ICD-10-CM | POA: Diagnosis not present

## 2023-09-23 DIAGNOSIS — H40013 Open angle with borderline findings, low risk, bilateral: Secondary | ICD-10-CM | POA: Diagnosis not present

## 2023-09-25 DIAGNOSIS — K219 Gastro-esophageal reflux disease without esophagitis: Secondary | ICD-10-CM | POA: Diagnosis not present

## 2023-09-25 DIAGNOSIS — I110 Hypertensive heart disease with heart failure: Secondary | ICD-10-CM | POA: Diagnosis not present

## 2023-09-25 DIAGNOSIS — Z9181 History of falling: Secondary | ICD-10-CM | POA: Diagnosis not present

## 2023-09-25 DIAGNOSIS — Z79899 Other long term (current) drug therapy: Secondary | ICD-10-CM | POA: Diagnosis not present

## 2023-09-25 DIAGNOSIS — I509 Heart failure, unspecified: Secondary | ICD-10-CM | POA: Diagnosis not present

## 2023-09-25 DIAGNOSIS — G8929 Other chronic pain: Secondary | ICD-10-CM | POA: Diagnosis not present

## 2023-10-20 DIAGNOSIS — E782 Mixed hyperlipidemia: Secondary | ICD-10-CM | POA: Diagnosis not present

## 2023-10-20 DIAGNOSIS — I4891 Unspecified atrial fibrillation: Secondary | ICD-10-CM | POA: Diagnosis not present

## 2023-10-20 DIAGNOSIS — I130 Hypertensive heart and chronic kidney disease with heart failure and stage 1 through stage 4 chronic kidney disease, or unspecified chronic kidney disease: Secondary | ICD-10-CM | POA: Diagnosis not present

## 2023-10-20 DIAGNOSIS — R54 Age-related physical debility: Secondary | ICD-10-CM | POA: Diagnosis not present

## 2023-10-20 DIAGNOSIS — I509 Heart failure, unspecified: Secondary | ICD-10-CM | POA: Diagnosis not present

## 2023-10-20 DIAGNOSIS — H409 Unspecified glaucoma: Secondary | ICD-10-CM | POA: Diagnosis not present

## 2023-10-20 DIAGNOSIS — N189 Chronic kidney disease, unspecified: Secondary | ICD-10-CM | POA: Diagnosis not present

## 2023-10-20 DIAGNOSIS — G8929 Other chronic pain: Secondary | ICD-10-CM | POA: Diagnosis not present

## 2023-10-23 DIAGNOSIS — I4891 Unspecified atrial fibrillation: Secondary | ICD-10-CM | POA: Diagnosis not present

## 2023-10-23 DIAGNOSIS — R001 Bradycardia, unspecified: Secondary | ICD-10-CM | POA: Diagnosis not present

## 2023-10-23 DIAGNOSIS — I509 Heart failure, unspecified: Secondary | ICD-10-CM | POA: Diagnosis not present

## 2023-10-23 DIAGNOSIS — I110 Hypertensive heart disease with heart failure: Secondary | ICD-10-CM | POA: Diagnosis not present

## 2023-10-23 DIAGNOSIS — I7122 Aneurysm of the aortic arch, without rupture: Secondary | ICD-10-CM | POA: Diagnosis not present

## 2023-10-23 DIAGNOSIS — K219 Gastro-esophageal reflux disease without esophagitis: Secondary | ICD-10-CM | POA: Diagnosis not present

## 2023-10-23 DIAGNOSIS — R54 Age-related physical debility: Secondary | ICD-10-CM | POA: Diagnosis not present

## 2023-10-23 DIAGNOSIS — R197 Diarrhea, unspecified: Secondary | ICD-10-CM | POA: Diagnosis not present

## 2023-10-23 DIAGNOSIS — Z7901 Long term (current) use of anticoagulants: Secondary | ICD-10-CM | POA: Diagnosis not present

## 2023-10-23 DIAGNOSIS — Z9181 History of falling: Secondary | ICD-10-CM | POA: Diagnosis not present

## 2023-11-04 DIAGNOSIS — M79674 Pain in right toe(s): Secondary | ICD-10-CM | POA: Diagnosis not present

## 2023-11-04 DIAGNOSIS — B351 Tinea unguium: Secondary | ICD-10-CM | POA: Diagnosis not present

## 2023-11-04 DIAGNOSIS — I739 Peripheral vascular disease, unspecified: Secondary | ICD-10-CM | POA: Diagnosis not present

## 2023-11-04 DIAGNOSIS — L851 Acquired keratosis [keratoderma] palmaris et plantaris: Secondary | ICD-10-CM | POA: Diagnosis not present

## 2023-11-04 DIAGNOSIS — M79675 Pain in left toe(s): Secondary | ICD-10-CM | POA: Diagnosis not present

## 2023-11-06 DIAGNOSIS — I4891 Unspecified atrial fibrillation: Secondary | ICD-10-CM | POA: Diagnosis not present

## 2023-11-06 DIAGNOSIS — R54 Age-related physical debility: Secondary | ICD-10-CM | POA: Diagnosis not present

## 2023-11-06 DIAGNOSIS — Z9181 History of falling: Secondary | ICD-10-CM | POA: Diagnosis not present

## 2023-11-06 DIAGNOSIS — I110 Hypertensive heart disease with heart failure: Secondary | ICD-10-CM | POA: Diagnosis not present

## 2023-11-18 DIAGNOSIS — G8929 Other chronic pain: Secondary | ICD-10-CM | POA: Diagnosis not present

## 2023-11-18 DIAGNOSIS — I130 Hypertensive heart and chronic kidney disease with heart failure and stage 1 through stage 4 chronic kidney disease, or unspecified chronic kidney disease: Secondary | ICD-10-CM | POA: Diagnosis not present

## 2023-11-18 DIAGNOSIS — R54 Age-related physical debility: Secondary | ICD-10-CM | POA: Diagnosis not present

## 2023-11-18 DIAGNOSIS — I4891 Unspecified atrial fibrillation: Secondary | ICD-10-CM | POA: Diagnosis not present

## 2023-11-18 DIAGNOSIS — I509 Heart failure, unspecified: Secondary | ICD-10-CM | POA: Diagnosis not present

## 2023-11-18 DIAGNOSIS — E782 Mixed hyperlipidemia: Secondary | ICD-10-CM | POA: Diagnosis not present

## 2023-11-18 DIAGNOSIS — N189 Chronic kidney disease, unspecified: Secondary | ICD-10-CM | POA: Diagnosis not present

## 2023-11-20 DIAGNOSIS — I130 Hypertensive heart and chronic kidney disease with heart failure and stage 1 through stage 4 chronic kidney disease, or unspecified chronic kidney disease: Secondary | ICD-10-CM | POA: Diagnosis not present

## 2023-11-20 DIAGNOSIS — E782 Mixed hyperlipidemia: Secondary | ICD-10-CM | POA: Diagnosis not present

## 2023-11-20 DIAGNOSIS — R42 Dizziness and giddiness: Secondary | ICD-10-CM | POA: Diagnosis not present

## 2023-11-20 DIAGNOSIS — N1832 Chronic kidney disease, stage 3b: Secondary | ICD-10-CM | POA: Diagnosis not present

## 2023-11-20 DIAGNOSIS — K219 Gastro-esophageal reflux disease without esophagitis: Secondary | ICD-10-CM | POA: Diagnosis not present

## 2023-11-20 DIAGNOSIS — Z9181 History of falling: Secondary | ICD-10-CM | POA: Diagnosis not present

## 2023-11-20 DIAGNOSIS — I509 Heart failure, unspecified: Secondary | ICD-10-CM | POA: Diagnosis not present

## 2023-11-20 DIAGNOSIS — I129 Hypertensive chronic kidney disease with stage 1 through stage 4 chronic kidney disease, or unspecified chronic kidney disease: Secondary | ICD-10-CM | POA: Diagnosis not present

## 2023-12-03 DIAGNOSIS — I130 Hypertensive heart and chronic kidney disease with heart failure and stage 1 through stage 4 chronic kidney disease, or unspecified chronic kidney disease: Secondary | ICD-10-CM | POA: Diagnosis not present

## 2023-12-03 DIAGNOSIS — H409 Unspecified glaucoma: Secondary | ICD-10-CM | POA: Diagnosis not present

## 2023-12-03 DIAGNOSIS — I509 Heart failure, unspecified: Secondary | ICD-10-CM | POA: Diagnosis not present

## 2023-12-03 DIAGNOSIS — N189 Chronic kidney disease, unspecified: Secondary | ICD-10-CM | POA: Diagnosis not present

## 2023-12-03 DIAGNOSIS — I4891 Unspecified atrial fibrillation: Secondary | ICD-10-CM | POA: Diagnosis not present

## 2023-12-03 DIAGNOSIS — G8929 Other chronic pain: Secondary | ICD-10-CM | POA: Diagnosis not present

## 2023-12-03 DIAGNOSIS — R54 Age-related physical debility: Secondary | ICD-10-CM | POA: Diagnosis not present

## 2023-12-03 DIAGNOSIS — E782 Mixed hyperlipidemia: Secondary | ICD-10-CM | POA: Diagnosis not present

## 2023-12-07 DIAGNOSIS — I110 Hypertensive heart disease with heart failure: Secondary | ICD-10-CM | POA: Diagnosis not present

## 2023-12-07 DIAGNOSIS — R54 Age-related physical debility: Secondary | ICD-10-CM | POA: Diagnosis not present

## 2023-12-07 DIAGNOSIS — I4891 Unspecified atrial fibrillation: Secondary | ICD-10-CM | POA: Diagnosis not present

## 2023-12-07 DIAGNOSIS — Z9181 History of falling: Secondary | ICD-10-CM | POA: Diagnosis not present

## 2023-12-25 DIAGNOSIS — I130 Hypertensive heart and chronic kidney disease with heart failure and stage 1 through stage 4 chronic kidney disease, or unspecified chronic kidney disease: Secondary | ICD-10-CM | POA: Diagnosis not present

## 2023-12-25 DIAGNOSIS — N189 Chronic kidney disease, unspecified: Secondary | ICD-10-CM | POA: Diagnosis not present

## 2023-12-25 DIAGNOSIS — I4891 Unspecified atrial fibrillation: Secondary | ICD-10-CM | POA: Diagnosis not present

## 2023-12-25 DIAGNOSIS — I509 Heart failure, unspecified: Secondary | ICD-10-CM | POA: Diagnosis not present

## 2023-12-25 DIAGNOSIS — G8929 Other chronic pain: Secondary | ICD-10-CM | POA: Diagnosis not present

## 2023-12-25 DIAGNOSIS — E782 Mixed hyperlipidemia: Secondary | ICD-10-CM | POA: Diagnosis not present

## 2023-12-25 DIAGNOSIS — I7122 Aneurysm of the aortic arch, without rupture: Secondary | ICD-10-CM | POA: Diagnosis not present

## 2024-01-07 DIAGNOSIS — I4891 Unspecified atrial fibrillation: Secondary | ICD-10-CM | POA: Diagnosis not present

## 2024-01-07 DIAGNOSIS — Z9181 History of falling: Secondary | ICD-10-CM | POA: Diagnosis not present

## 2024-01-07 DIAGNOSIS — I110 Hypertensive heart disease with heart failure: Secondary | ICD-10-CM | POA: Diagnosis not present

## 2024-01-07 DIAGNOSIS — R54 Age-related physical debility: Secondary | ICD-10-CM | POA: Diagnosis not present

## 2024-01-20 DIAGNOSIS — M79675 Pain in left toe(s): Secondary | ICD-10-CM | POA: Diagnosis not present

## 2024-01-20 DIAGNOSIS — L851 Acquired keratosis [keratoderma] palmaris et plantaris: Secondary | ICD-10-CM | POA: Diagnosis not present

## 2024-01-20 DIAGNOSIS — I739 Peripheral vascular disease, unspecified: Secondary | ICD-10-CM | POA: Diagnosis not present

## 2024-01-20 DIAGNOSIS — B351 Tinea unguium: Secondary | ICD-10-CM | POA: Diagnosis not present

## 2024-01-20 DIAGNOSIS — M79674 Pain in right toe(s): Secondary | ICD-10-CM | POA: Diagnosis not present

## 2024-01-21 DIAGNOSIS — N189 Chronic kidney disease, unspecified: Secondary | ICD-10-CM | POA: Diagnosis not present

## 2024-01-21 DIAGNOSIS — I130 Hypertensive heart and chronic kidney disease with heart failure and stage 1 through stage 4 chronic kidney disease, or unspecified chronic kidney disease: Secondary | ICD-10-CM | POA: Diagnosis not present

## 2024-01-21 DIAGNOSIS — G8929 Other chronic pain: Secondary | ICD-10-CM | POA: Diagnosis not present

## 2024-01-21 DIAGNOSIS — I4891 Unspecified atrial fibrillation: Secondary | ICD-10-CM | POA: Diagnosis not present

## 2024-01-21 DIAGNOSIS — R54 Age-related physical debility: Secondary | ICD-10-CM | POA: Diagnosis not present

## 2024-01-21 DIAGNOSIS — F419 Anxiety disorder, unspecified: Secondary | ICD-10-CM | POA: Diagnosis not present

## 2024-01-21 DIAGNOSIS — E782 Mixed hyperlipidemia: Secondary | ICD-10-CM | POA: Diagnosis not present

## 2024-01-21 DIAGNOSIS — H409 Unspecified glaucoma: Secondary | ICD-10-CM | POA: Diagnosis not present

## 2024-01-21 DIAGNOSIS — I509 Heart failure, unspecified: Secondary | ICD-10-CM | POA: Diagnosis not present

## 2024-01-22 DIAGNOSIS — I7122 Aneurysm of the aortic arch, without rupture: Secondary | ICD-10-CM | POA: Diagnosis not present

## 2024-01-22 DIAGNOSIS — R0602 Shortness of breath: Secondary | ICD-10-CM | POA: Diagnosis not present

## 2024-01-22 DIAGNOSIS — G8929 Other chronic pain: Secondary | ICD-10-CM | POA: Diagnosis not present

## 2024-01-22 DIAGNOSIS — Z23 Encounter for immunization: Secondary | ICD-10-CM | POA: Diagnosis not present

## 2024-01-22 DIAGNOSIS — N189 Chronic kidney disease, unspecified: Secondary | ICD-10-CM | POA: Diagnosis not present

## 2024-01-22 DIAGNOSIS — R600 Localized edema: Secondary | ICD-10-CM | POA: Diagnosis not present

## 2024-01-22 DIAGNOSIS — I129 Hypertensive chronic kidney disease with stage 1 through stage 4 chronic kidney disease, or unspecified chronic kidney disease: Secondary | ICD-10-CM | POA: Diagnosis not present

## 2024-01-22 DIAGNOSIS — I4891 Unspecified atrial fibrillation: Secondary | ICD-10-CM | POA: Diagnosis not present

## 2024-01-22 DIAGNOSIS — F419 Anxiety disorder, unspecified: Secondary | ICD-10-CM | POA: Diagnosis not present

## 2024-01-22 DIAGNOSIS — R197 Diarrhea, unspecified: Secondary | ICD-10-CM | POA: Diagnosis not present

## 2024-01-22 DIAGNOSIS — E782 Mixed hyperlipidemia: Secondary | ICD-10-CM | POA: Diagnosis not present

## 2024-02-06 DIAGNOSIS — R54 Age-related physical debility: Secondary | ICD-10-CM | POA: Diagnosis not present

## 2024-02-06 DIAGNOSIS — I110 Hypertensive heart disease with heart failure: Secondary | ICD-10-CM | POA: Diagnosis not present

## 2024-02-06 DIAGNOSIS — Z9181 History of falling: Secondary | ICD-10-CM | POA: Diagnosis not present

## 2024-02-06 DIAGNOSIS — I4891 Unspecified atrial fibrillation: Secondary | ICD-10-CM | POA: Diagnosis not present

## 2024-02-12 DIAGNOSIS — I130 Hypertensive heart and chronic kidney disease with heart failure and stage 1 through stage 4 chronic kidney disease, or unspecified chronic kidney disease: Secondary | ICD-10-CM | POA: Diagnosis not present

## 2024-02-12 DIAGNOSIS — F419 Anxiety disorder, unspecified: Secondary | ICD-10-CM | POA: Diagnosis not present

## 2024-02-12 DIAGNOSIS — E782 Mixed hyperlipidemia: Secondary | ICD-10-CM | POA: Diagnosis not present

## 2024-02-12 DIAGNOSIS — G8929 Other chronic pain: Secondary | ICD-10-CM | POA: Diagnosis not present

## 2024-02-12 DIAGNOSIS — R54 Age-related physical debility: Secondary | ICD-10-CM | POA: Diagnosis not present

## 2024-02-12 DIAGNOSIS — N189 Chronic kidney disease, unspecified: Secondary | ICD-10-CM | POA: Diagnosis not present

## 2024-02-12 DIAGNOSIS — I509 Heart failure, unspecified: Secondary | ICD-10-CM | POA: Diagnosis not present

## 2024-02-12 DIAGNOSIS — I4891 Unspecified atrial fibrillation: Secondary | ICD-10-CM | POA: Diagnosis not present

## 2024-02-19 DIAGNOSIS — I7122 Aneurysm of the aortic arch, without rupture: Secondary | ICD-10-CM | POA: Diagnosis not present

## 2024-02-19 DIAGNOSIS — E782 Mixed hyperlipidemia: Secondary | ICD-10-CM | POA: Diagnosis not present

## 2024-02-19 DIAGNOSIS — I4891 Unspecified atrial fibrillation: Secondary | ICD-10-CM | POA: Diagnosis not present

## 2024-02-19 DIAGNOSIS — I130 Hypertensive heart and chronic kidney disease with heart failure and stage 1 through stage 4 chronic kidney disease, or unspecified chronic kidney disease: Secondary | ICD-10-CM | POA: Diagnosis not present

## 2024-02-19 DIAGNOSIS — N189 Chronic kidney disease, unspecified: Secondary | ICD-10-CM | POA: Diagnosis not present

## 2024-02-19 DIAGNOSIS — J3489 Other specified disorders of nose and nasal sinuses: Secondary | ICD-10-CM | POA: Diagnosis not present

## 2024-02-19 DIAGNOSIS — I509 Heart failure, unspecified: Secondary | ICD-10-CM | POA: Diagnosis not present

## 2024-02-19 DIAGNOSIS — G8929 Other chronic pain: Secondary | ICD-10-CM | POA: Diagnosis not present

## 2024-03-08 DIAGNOSIS — I4891 Unspecified atrial fibrillation: Secondary | ICD-10-CM | POA: Diagnosis not present

## 2024-03-08 DIAGNOSIS — Z9181 History of falling: Secondary | ICD-10-CM | POA: Diagnosis not present

## 2024-03-08 DIAGNOSIS — I110 Hypertensive heart disease with heart failure: Secondary | ICD-10-CM | POA: Diagnosis not present

## 2024-03-17 DIAGNOSIS — I4891 Unspecified atrial fibrillation: Secondary | ICD-10-CM | POA: Diagnosis not present

## 2024-03-17 DIAGNOSIS — G8929 Other chronic pain: Secondary | ICD-10-CM | POA: Diagnosis not present

## 2024-03-17 DIAGNOSIS — H409 Unspecified glaucoma: Secondary | ICD-10-CM | POA: Diagnosis not present

## 2024-03-17 DIAGNOSIS — I130 Hypertensive heart and chronic kidney disease with heart failure and stage 1 through stage 4 chronic kidney disease, or unspecified chronic kidney disease: Secondary | ICD-10-CM | POA: Diagnosis not present

## 2024-03-17 DIAGNOSIS — I509 Heart failure, unspecified: Secondary | ICD-10-CM | POA: Diagnosis not present

## 2024-03-17 DIAGNOSIS — E782 Mixed hyperlipidemia: Secondary | ICD-10-CM | POA: Diagnosis not present

## 2024-03-17 DIAGNOSIS — N189 Chronic kidney disease, unspecified: Secondary | ICD-10-CM | POA: Diagnosis not present

## 2024-03-17 DIAGNOSIS — R54 Age-related physical debility: Secondary | ICD-10-CM | POA: Diagnosis not present

## 2024-03-17 DIAGNOSIS — F419 Anxiety disorder, unspecified: Secondary | ICD-10-CM | POA: Diagnosis not present

## 2024-03-18 DIAGNOSIS — F5101 Primary insomnia: Secondary | ICD-10-CM | POA: Diagnosis not present

## 2024-03-18 DIAGNOSIS — H409 Unspecified glaucoma: Secondary | ICD-10-CM | POA: Diagnosis not present

## 2024-03-27 ENCOUNTER — Emergency Department (HOSPITAL_COMMUNITY)
Admission: EM | Admit: 2024-03-27 | Discharge: 2024-04-23 | Disposition: E | Attending: Emergency Medicine | Admitting: Emergency Medicine

## 2024-03-27 DIAGNOSIS — W19XXXA Unspecified fall, initial encounter: Secondary | ICD-10-CM

## 2024-03-27 DIAGNOSIS — Z79899 Other long term (current) drug therapy: Secondary | ICD-10-CM | POA: Insufficient documentation

## 2024-03-27 DIAGNOSIS — W01190A Fall on same level from slipping, tripping and stumbling with subsequent striking against furniture, initial encounter: Secondary | ICD-10-CM | POA: Insufficient documentation

## 2024-03-27 DIAGNOSIS — S0003XA Contusion of scalp, initial encounter: Secondary | ICD-10-CM | POA: Insufficient documentation

## 2024-03-27 DIAGNOSIS — I5032 Chronic diastolic (congestive) heart failure: Secondary | ICD-10-CM | POA: Insufficient documentation

## 2024-03-27 DIAGNOSIS — Z7901 Long term (current) use of anticoagulants: Secondary | ICD-10-CM | POA: Diagnosis not present

## 2024-03-27 DIAGNOSIS — I10 Essential (primary) hypertension: Secondary | ICD-10-CM | POA: Diagnosis not present

## 2024-03-27 DIAGNOSIS — I499 Cardiac arrhythmia, unspecified: Secondary | ICD-10-CM | POA: Diagnosis not present

## 2024-03-27 DIAGNOSIS — I110 Hypertensive heart disease with heart failure: Secondary | ICD-10-CM | POA: Diagnosis not present

## 2024-03-27 DIAGNOSIS — I469 Cardiac arrest, cause unspecified: Secondary | ICD-10-CM | POA: Insufficient documentation

## 2024-03-27 DIAGNOSIS — I482 Chronic atrial fibrillation, unspecified: Secondary | ICD-10-CM | POA: Diagnosis not present

## 2024-03-27 DIAGNOSIS — Z87442 Personal history of urinary calculi: Secondary | ICD-10-CM | POA: Insufficient documentation

## 2024-03-27 DIAGNOSIS — I468 Cardiac arrest due to other underlying condition: Secondary | ICD-10-CM | POA: Diagnosis not present

## 2024-03-27 DIAGNOSIS — R092 Respiratory arrest: Secondary | ICD-10-CM

## 2024-03-27 LAB — CBG MONITORING, ED: Glucose-Capillary: 81 mg/dL (ref 70–99)

## 2024-03-27 MED ORDER — ATROPINE SULFATE 1 MG/10ML IJ SOSY
PREFILLED_SYRINGE | INTRAMUSCULAR | Status: AC | PRN
  Administered 2024-03-27: 1 mg via INTRAVENOUS

## 2024-03-27 MED ORDER — EPINEPHRINE 1 MG/10ML IV SOSY
PREFILLED_SYRINGE | INTRAVENOUS | Status: AC | PRN
  Administered 2024-03-27 (×2): 1 mg via INTRAVENOUS

## 2024-03-27 MED ORDER — SODIUM BICARBONATE 8.4 % IV SOLN
INTRAVENOUS | Status: AC | PRN
  Administered 2024-03-27: 100 meq via INTRAVENOUS

## 2024-03-27 MED ORDER — CALCIUM CHLORIDE 10 % IV SOLN
INTRAVENOUS | Status: AC | PRN
  Administered 2024-03-27: 1 g via INTRAVENOUS

## 2024-03-27 MED ORDER — EPINEPHRINE 1 MG/10ML IV SOSY
PREFILLED_SYRINGE | INTRAVENOUS | Status: AC | PRN
  Administered 2024-03-27: 1 mg via INTRAVENOUS

## 2024-04-23 NOTE — Consult Note (Addendum)
 TRAUMA H&P  04-12-24, 4:07 PM   Chief Complaint: Level 1 trauma activation for fall on thinners, CPR in progress  Primary Survey:  BMV on arrival, CPR with [facility] device  The patient is an 89 y.o. female.   HPI: 89F s/p mechanical GLF on thinners. Alert and ambulatory initially on scene, but became more lethargic and EMS called. En route, degenerated into PEA and CPR started.   Past Medical History:  Diagnosis Date   Anemia    Ascending aortic aneurysm    a. 4.6cm by CT 07/2016.   Chronic diastolic CHF (congestive heart failure) (HCC)    Coronary artery calcification seen on CT scan 07/2016   History of blood transfusion    History of kidney stones    Hypertension    Hypomagnesemia    PAF (paroxysmal atrial fibrillation) (HCC)    a. dx during adm 08/2016.   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   Negative Jak2, BCR/ABL, normal epo level on 02/01/2014   Sepsis (HCC) 08/2016   Thrombocytopenia    Trigeminal neuralgia     Past Surgical History:  Procedure Laterality Date   BREAST CYST EXCISION  60 yrs ago   COLONOSCOPY WITH ESOPHAGOGASTRODUODENOSCOPY (EGD) N/A 03/10/2013   Rehman: normal except few diverticula in simoid   CYSTOSCOPY W/ URETERAL STENT PLACEMENT Right 01/02/2021   Procedure: CYSTOSCOPY WITH RETROGRADE PYELOGRAM/URETERAL STENT PLACEMENT;  Surgeon: Sherrilee Belvie CROME, MD;  Location: [facility];  Service: Urology;  Laterality: Right;   CYSTOSCOPY WITH RETROGRADE PYELOGRAM, URETEROSCOPY AND STENT PLACEMENT Right 01/19/2021   Procedure: CYSTOSCOPY WITH RETROGRADE PYELOGRAM, URETEROSCOPY AND STENT EXCHANGE;  Surgeon: Sherrilee Belvie CROME, MD;  Location: [facility];  Service: Urology;  Laterality: Right;   ESOPHAGEAL DILATION N/A 03/08/2021   Procedure: ESOPHAGEAL DILATION;  Surgeon: Golda Claudis PENNER, MD;  [facility];  Service: Endoscopy;  Laterality: N/A;   ESOPHAGOGASTRODUODENOSCOPY  2014   Erosive reflux esophagitis with stricture at GE junction which was  dilated with a balloon dilator to 18 mm. Moderate size sliding hiatal hernia. small ulcer at gastric body along with antral erosions   ESOPHAGOGASTRODUODENOSCOPY (EGD) WITH PROPOFOL  N/A 03/08/2021   Procedure: ESOPHAGOGASTRODUODENOSCOPY (EGD) WITH PROPOFOL ;  Surgeon: Golda Claudis PENNER, MD;  [facility];  Service: Endoscopy;  Laterality: N/A;  9:20   EXTRACORPOREAL SHOCK WAVE LITHOTRIPSY Right 09/20/2016   Procedure: RIGHT EXTRACORPOREAL SHOCK WAVE LITHOTRIPSY (ESWL);  Surgeon: Gaston Hamilton, MD;  Location: [facility];  Service: Urology;  Laterality: Right;   EXTRACORPOREAL SHOCK WAVE LITHOTRIPSY Right 02/16/2019   Procedure: EXTRACORPOREAL SHOCK WAVE LITHOTRIPSY (ESWL);  Surgeon: Matilda Senior, MD;  Location: [facility];  Service: Urology;  Laterality: Right;   EXTRACORPOREAL SHOCK WAVE LITHOTRIPSY Right 04/27/2019   Procedure: EXTRACORPOREAL SHOCK WAVE LITHOTRIPSY (ESWL);  Surgeon: Matilda Senior, MD;  Location: [facility];  Service: Urology;  Laterality: Right;   EYE SURGERY     cataract surgery bilat    Gamma Knife     Trigeminal    HOLMIUM LASER APPLICATION Right 01/19/2021   Procedure: HOLMIUM LASER APPLICATION;  Surgeon: Sherrilee Belvie CROME, MD;  Location: [facility];  Service: Urology;  Laterality: Right;   SLT LASER APPLICATION Left 09/27/2014   Procedure: SLT LASER APPLICATION;  Surgeon: Dow JULIANNA Burke, MD;  Location: [facility];  Service: Ophthalmology;  Laterality: Left;    No pertinent family history.  Social History:  reports that she has never smoked. She has never been exposed to tobacco smoke. She has never used smokeless tobacco. She reports  that she does not drink alcohol and does not use drugs.    Allergies:  Allergies  Allergen Reactions   Sulfa Antibiotics Other (See Comments)    Pt states that this med makes her feel crazy.      Medications: reviewed  Results for orders placed or performed during the hospital encounter of 04/02/24 (from the past 48 hours)   CBG monitoring, ED     Status: None   Collection Time: 2024/04/02  3:28 PM  Result Value Ref Range   Glucose-Capillary 81 70 - 99 mg/dL    Comment: Glucose reference range applies only to samples taken after fasting for at least 8 hours.    No results found.  ROS 10 point review of systems is negative except as listed above in HPI.  There were no vitals taken for this visit.  Secondary Survey:  GCS: E(1)//V(1)//M(1) Constitutional: well-developed, well-nourished Skull: normocephalic, posterior head wound Face/ENT: midface stable without deformity, poor  dentition, external inspection of ears and nose normal, hearing unable to be assessed  Oropharynx: normal oropharyngeal mucosa, no blood   Neck: no thyromegaly, trachea midline, c-collar applied in TB, unable to assess midline cervical tenderness to palpation, no C-spine stepoffs Chest: breath sounds equal bilaterally, no  respiratory effort Abdomen: soft, NT, no bruising, no hepatosplenomegaly FAST: not performed Pelvis: stable GU: normal female genitalia Skin: warm, dry, no rashes   Assessment/Plan: Plan GLF on thinners  CPR in progress on arrival - multiple rounds of CPR including epi, bicarb, atropine , and calcium , B finger thoracostomies by ED resident, nearly of CPR total without ROSC. TIme of death 28.   Critical care time:  Family update: provided to son by me  Dreama GEANNIE Hanger, MD General and Trauma Surgery [facility]

## 2024-04-23 NOTE — ED Provider Notes (Signed)
 [facility] Provider Note   CSN: [id number] Arrival date & time: 04/07/2024  1522     Patient presents with: No chief complaint on file.   [name] is a 89 y.o. female. Hx of ascending aortic aneurysm, essential hypertension, paroxysmal atrial fibrillation on Eliquis , polycythemia and thrombocytopenia, presents with severe epistaxis presenting with cardiac arrest.  History per EMS.  Per report, patient was bending over to grab something off the floor and fell backwards, striking her head on a coffee table.  Initially GCS 15 with EMS, oriented x 4.  They report at 1511, patient began having decorticate posturing, and then lost pulses.  Was in PEA arrest.  CPR was started at this time (1511).  Bilateral IVs placed, 1 dosage of epinephrine  given prior to arrival.  CPR on arrival.  Initially activated as a level 2 trauma, activated to level 1 on arrival secondary to CPR in progress secondary to concerns for traumatic arrest.  Remained in PEA arrest on arrival, intubated with a 7.5 ETT, 25 cm at the lips.  GCS of 3.   HPI     Prior to Admission medications   Medication Sig Start Date End Date Taking? Authorizing Provider  amiodarone  (PACERONE ) 200 MG tablet TAKE (1) TABLET BY MOUTH DAILY. 10/31/21   Strader, Laymon HERO, PA-C  apixaban  (ELIQUIS ) 2.5 MG TABS tablet Take 1 tablet (2.5 mg total) by mouth 2 (two) times daily. 08/09/23   Sheron Lorette GRADE, PA-C  Calcium  Carbonate-Vitamin D 600-400 MG-UNIT tablet Take 1 tablet by mouth daily.    [provider]  Cinnamon 500 MG capsule Take 1,000 mg by mouth every other day.    [provider]  Coenzyme Q10 (COQ10) 100 MG CAPS Take 100 mg by mouth daily.    [provider]  furosemide  (LASIX ) 20 MG tablet Take 1 tablet (20 mg total) by mouth daily as needed. Patient taking differently: Take 20 mg by mouth daily as needed for fluid. 10/20/19   Strader, Laymon HERO, PA-C   gabapentin  (NEURONTIN ) 300 MG capsule Take 300 mg by mouth. One every night    [provider]  metoprolol  succinate (TOPROL -XL) 50 MG 24 hr tablet Take 1 tablet (50 mg total) by mouth in the morning and at bedtime. 03/20/22   Kerrin Elspeth BROCKS, MD  Multiple Vitamin (MULTIVITAMIN WITH MINERALS) TABS tablet Take 1 tablet by mouth daily.    [provider]  pantoprazole  (PROTONIX ) 40 MG tablet TAKE ONE TABLET BY MOUTH ONCE DAILY BEFORE BREAKFAST. 03/26/23   Carlan, Chelsea L, NP  potassium chloride  SA (KLOR-CON ) 20 MEQ tablet Take 1 tablet (20 mEq total) by mouth daily. 01/08/21   Ricky Fines, MD  pravastatin  (PRAVACHOL ) 40 MG tablet Take 40 mg by mouth at bedtime.     [provider]  traMADol  (ULTRAM ) 50 MG tablet Take 50 mg by mouth every 6 (six) hours as needed for moderate pain (pain score 4-6).    [provider]    Allergies: Sulfa antibiotics    Review of Systems  Updated Vital Signs Ht 5' 3 (1.6 m)   Wt 56.7 kg   BMI 22.14 kg/m   Physical Exam Constitutional:      Comments: GCS 3, unresponsive, CPR in progress on arrival.  HENT:     Head:     Comments: Large hematoma present to left occipital posterior scalp.  No active bleeding appreciated. Eyes:     Comments: 8 cm,  unreactive to light  Cardiovascular:     Comments: Pulseless, PEA, CPR in progress Pulmonary:     Comments: Intubated with 7.5 ETT, 25 cm lobes, actively being bagged Vented bilateral chests, small amount of fluid appreciated from both lung fields, no gushes of air or significant amount of blood. Crepitus to bilateral ribs, secondary to CPR Abdominal:     Comments: No significant abdominal distention appreciated.  Musculoskeletal:     Comments: No appreciable deformity, lacerations, or abrasions to bilateral upper and lower extremities.  Neurological:     Comments: GCS of 3, unresponsive to pain or verbal stimulus, CPR in progress     (all labs ordered are  listed, but only abnormal results are displayed) Labs Reviewed  CBG MONITORING, ED    EKG: None  Radiology: No results found.   Procedure Name: Intubation Date/Time: 04/21/24 4:01 PM  Performed by: Arlee Katz, MDVentilation: Oral airway inserted - appropriate to patient size Laryngoscope Size: Mac and 00 Grade View: Grade I Tube size: 7.5 mm Number of attempts: 1 Airway Equipment and Method: Rigid stylet, Oral airway and Video-laryngoscopy Placement Confirmation: ETT inserted through vocal cords under direct vision, Positive ETCO2, CO2 detector and Breath sounds checked- equal and bilateral Secured at: 25 cm Tube secured with: ETT holder Dental Injury: Teeth and Oropharynx as per pre-operative assessment  Comments: CPR in progress, traumatic arrest.  Intubated for airway protection, difficulty bagging secondary to active CPR.  No medication induction secondary to cardiac arrest.       Medications Ordered in the ED  EPINEPHrine  (ADRENALIN ) 1 MG/10ML injection (1 mg Intravenous Given 21-Apr-2024 1535)  EPINEPHrine  (ADRENALIN ) 1 MG/10ML injection (1 mg Intravenous Given 2024/04/21 1530)  calcium  chloride injection (1 g Intravenous Given 04-21-2024 1526)  sodium bicarbonate  injection (100 mEq Intravenous Given 04-21-2024 1527)  atropine  1 MG/10ML injection (1 mg Intravenous Given 04-21-24 1536)  EPINEPHrine  (ADRENALIN ) 1 MG/10ML injection (1 mg Intravenous Given 2024-04-21 1521)                                    Medical Decision Making Risk Prescription drug management.   89 year old female with history of A-fib currently on Eliquis , as well as a ascending aortic aneurysm presenting as a level 1 trauma with CPR in progress.  Initially GCS 15 on arrival of EMS, decorticate posturing, and lost pulses at 1511.  Arrived at 1519, actively being bagged with a GCS of 3, and PEA arrest.  1 dosage of epi given with EMS.  Intubated with 7.5 ETT, 25 cm at the lips.  Vented bilateral chest  with some fluid presenting from each chest, no gushes of air, and no large amount of bleeding present from bilateral chest.  4x epi, 1x bicarb, 1x calcium , 1x atropine  given.  Remained in PEA arrest throughout the time.  CPR continued manually.  Intermittent small amount of cardiac activity on ultrasound appreciated, however this is very likely secondary to dosages of epinephrine  given.  Pupils nonreactive to light, 7 cm equally.  Remained pulseless throughout management.  Appreciated no shockable rhythms.  Remained PEA arrest, no cardiac activity, and pulseless.  Time of death called at 2.  Trauma surgery, ED team present throughout trauma arrest.  ME has been called for evaluation of case.  Family presented to the ED, has been notified by the trauma surgery team of the patient passing.  Considered traumatic arrest secondary to intracranial bleed secondary to  fall, on Eliquis , with associated seizure-like activity, decorticate posturing, and then entering PEA arrest.  Discussed with medical examiner, overall plan to refer patient to medical examiner secondary to traumatic association with arrest.     Final diagnoses:  Cardiac arrest [facility])  Respiratory arrest (HCC)  Fall, initial encounter  Hematoma of scalp, initial encounter    ED Discharge Orders     None          Arlee Katz, MD 2024/04/25 1950    Lenor Hollering, MD 04-25-2024 2332

## 2024-04-23 NOTE — Code Documentation (Signed)
Patient time of death occurred at 1538.  

## 2024-04-23 NOTE — Code Documentation (Signed)
 Finger thoracotomy L side performed by EDP

## 2024-04-23 NOTE — Progress Notes (Signed)
 Orthopedic Tech Progress Note Patient Details:  [name] 1929/03/24 [id number]  Patient ID: [name], female   DOB: Aug 08, 1928, 89 y.o.   MRN: [id number] Responded to Level 1Trauma ortho tech not needed. Thersia FALCON Gayland Nicol 28-Mar-2024, 3:40 PM

## 2024-04-23 NOTE — Code Documentation (Addendum)
 Finger thoracotomy R side performed by EDP

## 2024-04-23 NOTE — Code Documentation (Signed)
 Pt arrives via [facility] from home for a mechanical fall from standing. Per EMS pt was bending over to grab something off the floor and fell backwards, striking her head on a coffee table. Initially pt A+Ox4 with GCS of 15 per EMS. They report that at 1511 pt began having decorticate posturing and they lost pulses. CPR initiated at that time. Bilateral IV's placed and 1 epi given PTA. CPR continued on arrival. Dr. Lenor and Dr. Paola at bedside as pt arrived.

## 2024-04-23 DEATH — deceased
